# Patient Record
Sex: Female | Born: 1965
Health system: Southern US, Community
[De-identification: ages and names within clinical notes are randomized; demographics above are authoritative.]

## PROBLEM LIST (undated history)

## (undated) DIAGNOSIS — F419 Anxiety disorder, unspecified: Secondary | ICD-10-CM

## (undated) DIAGNOSIS — D509 Iron deficiency anemia, unspecified: Secondary | ICD-10-CM

## (undated) DIAGNOSIS — F32A Depression, unspecified: Secondary | ICD-10-CM

## (undated) DIAGNOSIS — K922 Gastrointestinal hemorrhage, unspecified: Secondary | ICD-10-CM

## (undated) DIAGNOSIS — Z4541 Encounter for adjustment and management of cerebrospinal fluid drainage device: Secondary | ICD-10-CM

## (undated) DIAGNOSIS — G4733 Obstructive sleep apnea (adult) (pediatric): Secondary | ICD-10-CM

## (undated) DIAGNOSIS — D649 Anemia, unspecified: Secondary | ICD-10-CM

## (undated) DIAGNOSIS — R202 Paresthesia of skin: Secondary | ICD-10-CM

## (undated) DIAGNOSIS — G4730 Sleep apnea, unspecified: Secondary | ICD-10-CM

## (undated) DIAGNOSIS — F329 Major depressive disorder, single episode, unspecified: Secondary | ICD-10-CM

## (undated) DIAGNOSIS — N183 Chronic kidney disease, stage 3 unspecified: Secondary | ICD-10-CM

## (undated) DIAGNOSIS — I639 Cerebral infarction, unspecified: Secondary | ICD-10-CM

## (undated) DIAGNOSIS — K2210 Ulcer of esophagus without bleeding: Secondary | ICD-10-CM

## (undated) DIAGNOSIS — K219 Gastro-esophageal reflux disease without esophagitis: Secondary | ICD-10-CM

## (undated) DIAGNOSIS — G919 Hydrocephalus, unspecified: Secondary | ICD-10-CM

## (undated) DIAGNOSIS — E739 Lactose intolerance, unspecified: Secondary | ICD-10-CM

## (undated) DIAGNOSIS — K253 Acute gastric ulcer without hemorrhage or perforation: Secondary | ICD-10-CM

## (undated) DIAGNOSIS — K222 Esophageal obstruction: Secondary | ICD-10-CM

## (undated) DIAGNOSIS — E785 Hyperlipidemia, unspecified: Secondary | ICD-10-CM

## (undated) DIAGNOSIS — G43009 Migraine without aura, not intractable, without status migrainosus: Secondary | ICD-10-CM

## (undated) DIAGNOSIS — K449 Diaphragmatic hernia without obstruction or gangrene: Secondary | ICD-10-CM

## (undated) DIAGNOSIS — R413 Other amnesia: Secondary | ICD-10-CM

## (undated) DIAGNOSIS — R9431 Abnormal electrocardiogram [ECG] [EKG]: Secondary | ICD-10-CM

## (undated) HISTORY — DX: Abnormal electrocardiogram (ECG) (EKG): R94.31

## (undated) HISTORY — DX: Obstructive sleep apnea (adult) (pediatric): G47.33

## (undated) HISTORY — DX: Lactose intolerance, unspecified: E73.9

## (undated) HISTORY — DX: Ulcer of esophagus without bleeding: K22.10

## (undated) HISTORY — PX: OTHER SURGICAL HISTORY: SHX169

## (undated) HISTORY — DX: Paresthesia of skin: R20.2

## (undated) HISTORY — DX: Diaphragmatic hernia without obstruction or gangrene: K44.9

## (undated) HISTORY — DX: Major depressive disorder, single episode, unspecified: F32.9

## (undated) HISTORY — DX: Migraine without aura, not intractable, without status migrainosus: G43.009

## (undated) HISTORY — DX: Hyperlipidemia, unspecified: E78.5

## (undated) HISTORY — PX: CYST REMOVAL NECK: SHX6281

## (undated) HISTORY — DX: Cerebral infarction, unspecified: I63.9

## (undated) HISTORY — DX: Gastro-esophageal reflux disease without esophagitis: K21.9

## (undated) HISTORY — DX: Acute gastric ulcer without hemorrhage or perforation: K25.3

## (undated) HISTORY — DX: Encounter for adjustment and management of cerebrospinal fluid drainage device: Z45.41

## (undated) HISTORY — DX: Anemia, unspecified: D64.9

## (undated) HISTORY — DX: Esophageal obstruction: K22.2

## (undated) HISTORY — DX: Sleep apnea, unspecified: G47.30

## (undated) HISTORY — DX: Hydrocephalus, unspecified: G91.9

## (undated) HISTORY — DX: Depression, unspecified: F32.A

## (undated) HISTORY — DX: Gastrointestinal hemorrhage, unspecified: K92.2

## (undated) HISTORY — DX: Iron deficiency anemia, unspecified: D50.9

## (undated) HISTORY — DX: Chronic kidney disease, stage 3 unspecified: N18.30

## (undated) HISTORY — DX: Other amnesia: R41.3

## (undated) HISTORY — DX: Anxiety disorder, unspecified: F41.9

---
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
## 1998-07-31 ENCOUNTER — Ambulatory Visit (HOSPITAL_COMMUNITY): Admission: RE | Admit: 1998-07-31 | Discharge: 1998-07-31 | Payer: Self-pay | Admitting: Obstetrics and Gynecology

## 1998-09-11 ENCOUNTER — Ambulatory Visit (HOSPITAL_COMMUNITY): Admission: RE | Admit: 1998-09-11 | Discharge: 1998-09-11 | Payer: Self-pay | Admitting: Obstetrics & Gynecology

## 1998-10-07 ENCOUNTER — Ambulatory Visit (HOSPITAL_COMMUNITY): Admission: RE | Admit: 1998-10-07 | Discharge: 1998-10-07 | Payer: Self-pay | Admitting: Obstetrics & Gynecology

## 2001-05-08 ENCOUNTER — Other Ambulatory Visit: Admission: RE | Admit: 2001-05-08 | Discharge: 2001-05-08 | Payer: Self-pay | Admitting: Gynecology

## 2002-09-22 ENCOUNTER — Emergency Department (HOSPITAL_COMMUNITY): Admission: EM | Admit: 2002-09-22 | Discharge: 2002-09-23 | Payer: Self-pay | Admitting: Emergency Medicine

## 2002-09-22 ENCOUNTER — Encounter: Payer: Self-pay | Admitting: Emergency Medicine

## 2003-02-20 ENCOUNTER — Encounter: Payer: Self-pay | Admitting: Internal Medicine

## 2003-02-20 ENCOUNTER — Ambulatory Visit (HOSPITAL_COMMUNITY): Admission: RE | Admit: 2003-02-20 | Discharge: 2003-02-20 | Payer: Self-pay | Admitting: Internal Medicine

## 2004-08-25 ENCOUNTER — Ambulatory Visit (HOSPITAL_COMMUNITY): Admission: RE | Admit: 2004-08-25 | Discharge: 2004-08-25 | Payer: Self-pay | Admitting: Neurology

## 2004-11-26 ENCOUNTER — Ambulatory Visit: Payer: Self-pay | Admitting: Internal Medicine

## 2004-11-30 ENCOUNTER — Ambulatory Visit: Payer: Self-pay | Admitting: Internal Medicine

## 2004-12-24 ENCOUNTER — Ambulatory Visit (HOSPITAL_BASED_OUTPATIENT_CLINIC_OR_DEPARTMENT_OTHER): Admission: RE | Admit: 2004-12-24 | Discharge: 2004-12-24 | Payer: Self-pay | Admitting: Internal Medicine

## 2004-12-24 ENCOUNTER — Ambulatory Visit: Payer: Self-pay | Admitting: Pulmonary Disease

## 2005-02-07 ENCOUNTER — Ambulatory Visit: Payer: Self-pay | Admitting: Internal Medicine

## 2005-05-19 ENCOUNTER — Ambulatory Visit: Payer: Self-pay | Admitting: Pulmonary Disease

## 2005-06-21 ENCOUNTER — Ambulatory Visit: Payer: Self-pay | Admitting: Pulmonary Disease

## 2005-08-23 ENCOUNTER — Other Ambulatory Visit: Admission: RE | Admit: 2005-08-23 | Discharge: 2005-08-23 | Payer: Self-pay | Admitting: Gynecology

## 2005-11-22 ENCOUNTER — Ambulatory Visit: Payer: Self-pay | Admitting: Internal Medicine

## 2006-08-24 ENCOUNTER — Ambulatory Visit: Payer: Self-pay | Admitting: Internal Medicine

## 2006-10-23 ENCOUNTER — Ambulatory Visit: Payer: Self-pay | Admitting: Internal Medicine

## 2006-11-22 ENCOUNTER — Ambulatory Visit: Payer: Self-pay | Admitting: Internal Medicine

## 2006-11-22 LAB — CONVERTED CEMR LAB
ALT: 25 units/L (ref 0–40)
AST: 25 units/L (ref 0–37)
Albumin: 3.4 g/dL — ABNORMAL LOW (ref 3.5–5.2)
Alkaline Phosphatase: 45 units/L (ref 39–117)
BUN: 8 mg/dL (ref 6–23)
Basophils Absolute: 0 10*3/uL (ref 0.0–0.1)
Basophils Relative: 0.8 % (ref 0.0–1.0)
Bilirubin Urine: NEGATIVE
CO2: 29 meq/L (ref 19–32)
Calcium: 9.6 mg/dL (ref 8.4–10.5)
Chloride: 106 meq/L (ref 96–112)
Chol/HDL Ratio, serum: 6.2
Cholesterol: 291 mg/dL (ref 0–200)
Creatinine, Ser: 0.8 mg/dL (ref 0.4–1.2)
Eosinophil percent: 2.6 % (ref 0.0–5.0)
GFR calc non Af Amer: 84 mL/min
Glomerular Filtration Rate, Af Am: 101 mL/min/{1.73_m2}
Glucose, Bld: 108 mg/dL — ABNORMAL HIGH (ref 70–99)
HCT: 40.8 % (ref 36.0–46.0)
HDL: 47.3 mg/dL (ref >39.0)
Hemoglobin, Urine: NEGATIVE
Hemoglobin: 13.5 g/dL (ref 12.0–15.0)
Ketones, ur: NEGATIVE mg/dL
LDL DIRECT: 203.7 mg/dL
Leukocytes, UA: NEGATIVE
Lymphocytes Relative: 33.3 % (ref 12.0–46.0)
MCHC: 33.2 g/dL (ref 30.0–36.0)
MCV: 91.8 fL (ref 78.0–100.0)
Monocytes Absolute: 0.5 10*3/uL (ref 0.2–0.7)
Monocytes Relative: 8 % (ref 3.0–11.0)
Neutro Abs: 3.4 10*3/uL (ref 1.4–7.7)
Neutrophils Relative %: 55.3 % (ref 43.0–77.0)
Nitrite: NEGATIVE
Platelets: 483 10*3/uL — ABNORMAL HIGH (ref 150–400)
Potassium: 4.4 meq/L (ref 3.5–5.1)
RBC: 4.44 M/uL (ref 3.87–5.11)
RDW: 12.4 % (ref 11.5–14.6)
Sodium: 140 meq/L (ref 135–145)
Specific Gravity, Urine: >=1.03 (ref 1.000–1.03)
TSH: 2.11 microintl units/mL (ref 0.35–5.50)
Total Bilirubin: 0.6 mg/dL (ref 0.3–1.2)
Total Protein, Urine: NEGATIVE mg/dL
Total Protein: 7.1 g/dL (ref 6.0–8.3)
Triglyceride fasting, serum: 136 mg/dL (ref 0–149)
Urine Glucose: NEGATIVE mg/dL
Urobilinogen, UA: 0.2 (ref 0.0–1.0)
VLDL: 27 mg/dL (ref 0–40)
WBC: 6.2 10*3/uL (ref 4.5–10.5)
pH: 6 (ref 5.0–8.0)

## 2006-11-23 ENCOUNTER — Encounter (INDEPENDENT_AMBULATORY_CARE_PROVIDER_SITE_OTHER): Payer: Self-pay | Admitting: *Deleted

## 2006-11-23 LAB — CONVERTED CEMR LAB: Glucose, Bld: 109 mg/dL — ABNORMAL HIGH (ref 70–99)

## 2006-11-27 ENCOUNTER — Ambulatory Visit: Payer: Self-pay | Admitting: Internal Medicine

## 2007-10-01 ENCOUNTER — Ambulatory Visit: Payer: Self-pay | Admitting: Internal Medicine

## 2007-10-01 DIAGNOSIS — G4733 Obstructive sleep apnea (adult) (pediatric): Secondary | ICD-10-CM

## 2007-10-01 DIAGNOSIS — G43009 Migraine without aura, not intractable, without status migrainosus: Secondary | ICD-10-CM | POA: Insufficient documentation

## 2007-10-01 DIAGNOSIS — E785 Hyperlipidemia, unspecified: Secondary | ICD-10-CM | POA: Insufficient documentation

## 2007-10-01 DIAGNOSIS — E739 Lactose intolerance, unspecified: Secondary | ICD-10-CM

## 2007-10-01 DIAGNOSIS — Z9989 Dependence on other enabling machines and devices: Secondary | ICD-10-CM

## 2007-10-01 DIAGNOSIS — L02419 Cutaneous abscess of limb, unspecified: Secondary | ICD-10-CM | POA: Insufficient documentation

## 2007-10-01 DIAGNOSIS — L03119 Cellulitis of unspecified part of limb: Secondary | ICD-10-CM

## 2007-10-01 DIAGNOSIS — Z982 Presence of cerebrospinal fluid drainage device: Secondary | ICD-10-CM | POA: Insufficient documentation

## 2007-10-01 HISTORY — DX: Hyperlipidemia, unspecified: E78.5

## 2007-10-01 HISTORY — DX: Migraine without aura, not intractable, without status migrainosus: G43.009

## 2007-10-01 HISTORY — DX: Lactose intolerance, unspecified: E73.9

## 2007-10-01 HISTORY — DX: Obstructive sleep apnea (adult) (pediatric): G47.33

## 2007-11-17 ENCOUNTER — Emergency Department (HOSPITAL_COMMUNITY): Admission: EM | Admit: 2007-11-17 | Discharge: 2007-11-17 | Payer: Self-pay | Admitting: Family Medicine

## 2007-12-12 ENCOUNTER — Ambulatory Visit: Payer: Self-pay | Admitting: Internal Medicine

## 2008-11-19 ENCOUNTER — Ambulatory Visit: Payer: Self-pay | Admitting: Internal Medicine

## 2008-11-19 DIAGNOSIS — G8929 Other chronic pain: Secondary | ICD-10-CM | POA: Insufficient documentation

## 2008-11-19 DIAGNOSIS — J019 Acute sinusitis, unspecified: Secondary | ICD-10-CM | POA: Insufficient documentation

## 2008-11-19 DIAGNOSIS — F419 Anxiety disorder, unspecified: Secondary | ICD-10-CM | POA: Insufficient documentation

## 2009-03-11 ENCOUNTER — Telehealth: Payer: Self-pay | Admitting: Internal Medicine

## 2009-03-18 ENCOUNTER — Encounter: Payer: Self-pay | Admitting: Internal Medicine

## 2009-09-08 ENCOUNTER — Ambulatory Visit: Payer: Self-pay | Admitting: Endocrinology

## 2009-10-13 ENCOUNTER — Ambulatory Visit: Payer: Self-pay | Admitting: Internal Medicine

## 2009-10-13 DIAGNOSIS — R05 Cough: Secondary | ICD-10-CM

## 2009-10-13 DIAGNOSIS — R059 Cough, unspecified: Secondary | ICD-10-CM | POA: Insufficient documentation

## 2009-11-30 ENCOUNTER — Ambulatory Visit: Payer: Self-pay | Admitting: Internal Medicine

## 2009-11-30 DIAGNOSIS — R079 Chest pain, unspecified: Secondary | ICD-10-CM | POA: Insufficient documentation

## 2009-11-30 LAB — CONVERTED CEMR LAB
ALT: 23 units/L (ref 0–35)
AST: 25 units/L (ref 0–37)
Albumin: 4 g/dL (ref 3.5–5.2)
Alkaline Phosphatase: 40 units/L (ref 39–117)
BUN: 13 mg/dL (ref 6–23)
Basophils Absolute: 0 10*3/uL (ref 0.0–0.1)
Basophils Relative: 0 % (ref 0.0–3.0)
Bilirubin, Direct: 0 mg/dL (ref 0.0–0.3)
CO2: 27 meq/L (ref 19–32)
Calcium: 9.4 mg/dL (ref 8.4–10.5)
Chloride: 104 meq/L (ref 96–112)
Creatinine, Ser: 0.8 mg/dL (ref 0.4–1.2)
Eosinophils Absolute: 0.1 10*3/uL (ref 0.0–0.7)
Eosinophils Relative: 2.1 % (ref 0.0–5.0)
GFR calc non Af Amer: 82.21 mL/min (ref >60)
Glucose, Bld: 92 mg/dL (ref 70–99)
HCT: 38.3 % (ref 36.0–46.0)
Hemoglobin: 12.8 g/dL (ref 12.0–15.0)
Lymphocytes Relative: 39.9 % (ref 12.0–46.0)
Lymphs Abs: 2.8 10*3/uL (ref 0.7–4.0)
MCHC: 33.6 g/dL (ref 30.0–36.0)
MCV: 92.7 fL (ref 78.0–100.0)
Monocytes Absolute: 0.4 10*3/uL (ref 0.1–1.0)
Monocytes Relative: 5.9 % (ref 3.0–12.0)
Neutro Abs: 3.6 10*3/uL (ref 1.4–7.7)
Neutrophils Relative %: 52.1 % (ref 43.0–77.0)
Platelets: 429 10*3/uL — ABNORMAL HIGH (ref 150.0–400.0)
Potassium: 4.3 meq/L (ref 3.5–5.1)
RBC: 4.13 M/uL (ref 3.87–5.11)
RDW: 12.4 % (ref 11.5–14.6)
Sed Rate: 42 mm/hr — ABNORMAL HIGH (ref 0–22)
Sodium: 139 meq/L (ref 135–145)
TSH: 2.95 microintl units/mL (ref 0.35–5.50)
Total Bilirubin: 0.6 mg/dL (ref 0.3–1.2)
Total CK: 65 units/L (ref 7–177)
Total Protein: 7.2 g/dL (ref 6.0–8.3)
Vitamin B-12: 388 pg/mL (ref 211–911)
WBC: 6.9 10*3/uL (ref 4.5–10.5)

## 2009-12-01 ENCOUNTER — Encounter: Payer: Self-pay | Admitting: Internal Medicine

## 2009-12-01 ENCOUNTER — Telehealth: Payer: Self-pay | Admitting: Internal Medicine

## 2009-12-04 ENCOUNTER — Ambulatory Visit (HOSPITAL_COMMUNITY): Admission: RE | Admit: 2009-12-04 | Discharge: 2009-12-04 | Payer: Self-pay | Admitting: Internal Medicine

## 2010-06-29 ENCOUNTER — Ambulatory Visit: Payer: Self-pay | Admitting: Internal Medicine

## 2010-06-29 DIAGNOSIS — R5383 Other fatigue: Secondary | ICD-10-CM | POA: Insufficient documentation

## 2010-06-29 LAB — CONVERTED CEMR LAB: Vit D, 25-Hydroxy: 39 ng/mL (ref 30–89)

## 2010-07-01 ENCOUNTER — Telehealth: Payer: Self-pay | Admitting: Internal Medicine

## 2010-07-01 LAB — CONVERTED CEMR LAB
ALT: 22 units/L (ref 0–35)
AST: 23 units/L (ref 0–37)
Albumin: 3.9 g/dL (ref 3.5–5.2)
Alkaline Phosphatase: 41 units/L (ref 39–117)
BUN: 12 mg/dL (ref 6–23)
Basophils Absolute: 0 10*3/uL (ref 0.0–0.1)
Basophils Relative: 0.6 % (ref 0.0–3.0)
Bilirubin Urine: NEGATIVE
Bilirubin, Direct: 0.1 mg/dL (ref 0.0–0.3)
CO2: 32 meq/L (ref 19–32)
Calcium: 9.6 mg/dL (ref 8.4–10.5)
Chloride: 105 meq/L (ref 96–112)
Cholesterol: 306 mg/dL — ABNORMAL HIGH (ref 0–200)
Creatinine, Ser: 0.9 mg/dL (ref 0.4–1.2)
Direct LDL: 236.3 mg/dL
Eosinophils Absolute: 0.1 10*3/uL (ref 0.0–0.7)
Eosinophils Relative: 1.7 % (ref 0.0–5.0)
GFR calc non Af Amer: 76.46 mL/min (ref >60)
Glucose, Bld: 85 mg/dL (ref 70–99)
HCT: 37.3 % (ref 36.0–46.0)
HDL: 48.8 mg/dL (ref >39.00)
Hemoglobin, Urine: NEGATIVE
Hemoglobin: 12.9 g/dL (ref 12.0–15.0)
Ketones, ur: NEGATIVE mg/dL
Leukocytes, UA: NEGATIVE
Lymphocytes Relative: 36.5 % (ref 12.0–46.0)
Lymphs Abs: 2.3 10*3/uL (ref 0.7–4.0)
MCHC: 34.6 g/dL (ref 30.0–36.0)
MCV: 91.1 fL (ref 78.0–100.0)
Monocytes Absolute: 0.5 10*3/uL (ref 0.1–1.0)
Monocytes Relative: 8.3 % (ref 3.0–12.0)
Neutro Abs: 3.3 10*3/uL (ref 1.4–7.7)
Neutrophils Relative %: 52.9 % (ref 43.0–77.0)
Nitrite: NEGATIVE
Platelets: 435 10*3/uL — ABNORMAL HIGH (ref 150.0–400.0)
Potassium: 4.9 meq/L (ref 3.5–5.1)
RBC: 4.09 M/uL (ref 3.87–5.11)
RDW: 13.9 % (ref 11.5–14.6)
Sodium: 142 meq/L (ref 135–145)
Specific Gravity, Urine: >=1.03 (ref 1.000–1.030)
TSH: 2.81 microintl units/mL (ref 0.35–5.50)
Total Bilirubin: 0.5 mg/dL (ref 0.3–1.2)
Total CHOL/HDL Ratio: 6
Total Protein, Urine: NEGATIVE mg/dL
Total Protein: 7.1 g/dL (ref 6.0–8.3)
Triglycerides: 139 mg/dL (ref 0.0–149.0)
Urine Glucose: NEGATIVE mg/dL
Urobilinogen, UA: 0.2 (ref 0.0–1.0)
VLDL: 27.8 mg/dL (ref 0.0–40.0)
Vitamin B-12: 363 pg/mL (ref 211–911)
WBC: 6.2 10*3/uL (ref 4.5–10.5)
pH: 5.5 (ref 5.0–8.0)

## 2010-07-09 ENCOUNTER — Telehealth: Payer: Self-pay | Admitting: Internal Medicine

## 2010-08-09 ENCOUNTER — Telehealth: Payer: Self-pay | Admitting: Internal Medicine

## 2010-09-03 ENCOUNTER — Ambulatory Visit: Payer: Self-pay | Admitting: Internal Medicine

## 2010-09-03 DIAGNOSIS — E162 Hypoglycemia, unspecified: Secondary | ICD-10-CM | POA: Insufficient documentation

## 2010-09-15 ENCOUNTER — Ambulatory Visit: Payer: Self-pay | Admitting: Internal Medicine

## 2010-09-15 LAB — CONVERTED CEMR LAB
C-Peptide: 1.74 ng/mL (ref 0.80–3.90)
Cortisol, Plasma: 9.1 ug/dL
Glucose, Bld: 85 mg/dL (ref 70–99)
Insulin: 7 microintl units/mL (ref 3–28)

## 2010-09-16 ENCOUNTER — Ambulatory Visit: Payer: Self-pay | Admitting: Internal Medicine

## 2010-09-16 DIAGNOSIS — R110 Nausea: Secondary | ICD-10-CM | POA: Insufficient documentation

## 2010-09-16 DIAGNOSIS — F4323 Adjustment disorder with mixed anxiety and depressed mood: Secondary | ICD-10-CM | POA: Insufficient documentation

## 2010-10-15 ENCOUNTER — Telehealth: Payer: Self-pay | Admitting: Internal Medicine

## 2010-10-28 ENCOUNTER — Ambulatory Visit: Payer: Self-pay | Admitting: Internal Medicine

## 2010-12-14 NOTE — Assessment & Plan Note (Signed)
Summary: COLD / NWS   Vital Signs:  Patient profile:   45 year old female Height:      58 inches (147.32 cm) Weight:      137.13 pounds (62.33 kg) BMI:     28.76 O2 Sat:      97 % on Room air Temp:     97.5 degrees F (36.39 degrees C) oral Pulse rate:   86 / minute BP sitting:   124 / 80  (left arm) Cuff size:   regular  Vitals Entered By: Josph Macho CMA (September 08, 2009 3:13 PM)  O2 Flow:  Room air CC: Cold X1Week/ CF   CC:  Cold X1Week/ CF.  History of Present Illness: 1 week of sore throat, nasal congestion, prod cough.  Current Medications (verified): 1)  Imitrex 100 Mg  Tabs (Sumatriptan Succinate) .Marland Kitchen.. 1 By Mouth Once Daily Prn 2)  Citalopram Hydrobromide 10 Mg  Tabs (Citalopram Hydrobromide) .... Take 1 Tab By Mouth Daily 3)  Lorazepam 0.5 Mg Tabs (Lorazepam) .Marland Kitchen.. 1 By Mouth Two Times A Day As Needed Anxiety 4)  Vitamin D3 1000 Unit  Tabs (Cholecalciferol) .Marland Kitchen.. 1 By Mouth Daily 5)  Multivitamins   Tabs (Multiple Vitamin) .... Once Daily 6)  Fish Oil Caps .Marland Kitchen.. 6 A Day 7)  Ginko Biloba .Marland Kitchen.. 4 A Day  Allergies (verified): No Known Drug Allergies  Past History:  Past Medical History: Last updated: 11/19/2008 migraine HA OSA Hyperlipidemia glucose intolerance cerebral shunts Anxiety  Review of Systems  The patient denies fever and dyspnea on exertion.    Physical Exam  General:  obese.  no distress  Head:  head: no deformity eyes: no periorbital swelling, no proptosis external nose and ears are normal mouth: no lesion seen Ears:  left tm is slightly red Neck:  Supple without thyroid enlargement or tenderness. No cervical lymphadenopathy, neck masses or tracheal deviation.  Lungs:  Clear to auscultation bilaterally. Normal respiratory effort.    Impression & Recommendations:  Problem # 1:  SINUSITIS, ACUTE (ICD-461.9)  Medications Added to Medication List This Visit: 1)  Fish Oil Caps  .Marland Kitchen.. 6 a day 2)  Ginko Biloba  .Marland Kitchen.. 4 a day 3)   Cefuroxime Axetil 250 Mg Tabs (Cefuroxime axetil) .Marland Kitchen.. 1 bid  Other Orders: Est. Patient Level III (04540)  Patient Instructions: 1)  cefuroxime 250 mg two times a day 2)  loratadine-d as needed congestion 3)  return here as needed Prescriptions: CEFUROXIME AXETIL 250 MG TABS (CEFUROXIME AXETIL) 1 bid  #14 x 0   Entered and Authorized by:   Minus Breeding MD   Signed by:   Minus Breeding MD on 09/08/2009   Method used:   Electronically to        [facility] [address] (retail)       [address]       [place], [place]  [address]       Ph: [phone]       Fax: [phone]   RxID:   [id number]

## 2010-12-14 NOTE — Assessment & Plan Note (Signed)
Summary: cold,cough/no fever/requests dr plot/$50/cd   Vital Signs:  Patient Profile:   45 Years Old Female Weight:      132 pounds Temp:     98.2 degrees F oral Pulse rate:   72 / minute BP sitting:   152 / 100  (left arm)  Vitals Entered By: Tora Perches (November 19, 2008 4:28 PM)                 Chief Complaint:  Multiple medical problems or concerns.  History of Present Illness: The patient presents with complaints of sore throat, fever,  sinus congestion and drainge of several 2 wks  duration. Not better with OTC meds.    The mucus is colored. C/o anxiety, stressed out    Current Allergies: No known allergies   Past Medical History:    migraine HA    OSA    Hyperlipidemia    glucose intolerance    cerebral shunts    Anxiety   Family History:    aunt with melanoma    uncle with lung cancer    grandmother with lung cancer    grandfather with lung cancer    fathe died with MI at 47 yo    M dementia  Social History:    Never Smoked    Alcohol use-yes    Occupation: [employer]    Married    M is in a NH    Review of Systems       BP nl at home   Physical Exam  General:     NAD Nose:     Erythematous throat mucosa and intranasal erythema.  Lungs:     CTA Heart:     RRR Abdomen:     Bowel sounds positive,abdomen soft and non-tender without masses, organomegaly or hernias noted. Msk:     No deformity or scoliosis noted of thoracic or lumbar spine.   Skin:     clear Psych:     Oriented X3, normally interactive, and good eye contact.      Impression & Recommendations:  Problem # 1:  SINUSITIS, ACUTE (ICD-461.9)  The following medications were removed from the medication list:    Doxycycline Hyclate 100 Mg Tabs (Doxycycline hyclate) .Marland Kitchen... 1 by mouth two times a day  Her updated medication list for this problem includes:    Zithromax Z-pak 250 Mg Tabs (Azithromycin) ..... Use as directed   Problem # 2:  ANXIETY  (ICD-300.00) Assessment: Deteriorated  Her updated medication list for this problem includes:    Citalopram Hydrobromide 10 Mg Tabs (Citalopram hydrobromide) .Marland Kitchen... Take 1 tab by mouth daily    Lorazepam 0.5 Mg Tabs (Lorazepam) .Marland Kitchen... 1 by mouth two times a day as needed anxiety >20 min  Problem # 3:  HYPERLIPIDEMIA (ICD-272.4) Get labs  Complete Medication List: 1)  Imitrex 100 Mg Tabs (Sumatriptan succinate) .Marland Kitchen.. 1 by mouth once daily prn 2)  Citalopram Hydrobromide 10 Mg Tabs (Citalopram hydrobromide) .... Take 1 tab by mouth daily 3)  Lorazepam 0.5 Mg Tabs (Lorazepam) .Marland Kitchen.. 1 by mouth two times a day as needed anxiety 4)  Zithromax Z-pak 250 Mg Tabs (Azithromycin) .... Use as directed 5)  Vitamin D3 1000 Unit Tabs (Cholecalciferol) .Marland Kitchen.. 1 by mouth daily 6)  Multivitamins Tabs (Multiple vitamin) .... Once daily   Patient Instructions: 1)  Please schedule a follow-up appointment in 3 months. 2)  BMP prior to visit, ICD-9: v70.0  300.00 3)  Hepatic Panel prior to visit,  ICD-9: 4)  Lipid Panel prior to visit, ICD-9: 5)  TSH prior to visit, ICD-9: 6)  CBC w/ Diff prior to visit, ICD-9: 7)  Urine-dip prior to visit, ICD-9:   Prescriptions: ZITHROMAX Z-PAK 250 MG  TABS (AZITHROMYCIN) Use as directed  #1 x 1   Entered and Authorized by:   Tresa Garter MD   Signed by:   Tresa Garter MD on 11/19/2008   Method used:   Print then Give to Patient   RxID:   [id number] LORAZEPAM 0.5 MG TABS (LORAZEPAM) 1 by mouth two times a day as needed anxiety  #60 x 1   Entered and Authorized by:   Tresa Garter MD   Signed by:   Tresa Garter MD on 11/19/2008   Method used:   Print then Give to Patient   RxID:   [id number] CITALOPRAM HYDROBROMIDE 10 MG  TABS (CITALOPRAM HYDROBROMIDE) Take 1 tab by mouth daily  #30 x 6   Entered and Authorized by:   Tresa Garter MD   Signed by:   Tresa Garter MD on 11/19/2008   Method used:   Print then Give to  Patient   RxID:   [id number]  ]

## 2010-12-14 NOTE — Assessment & Plan Note (Signed)
Summary: POSSIBLE SPIDER BITE/NML   Vital Signs:  Patient Profile:   45 Years Old Female Weight:      134 pounds Temp:     97 degrees F oral Pulse rate:   97 / minute BP sitting:   123 / 79  (right arm) Cuff size:   regular  Pt. in pain?   no  Vitals Entered By: Maris Berger (October 01, 2007 3:58 PM)                  Chief Complaint:  SPIDER BITE.  History of Present Illness: here with spot of red, tender and pus to prox ant leg just below the knee, now with pain this am increasing up the mid ant right thigh area  Current Allergies (reviewed today): No known allergies  Updated/Current Medications (including changes made in today's visit):  IMITREX 100 MG  TABS (SUMATRIPTAN SUCCINATE) 1 by mouth once daily prn DOXYCYCLINE HYCLATE 100 MG  TABS (DOXYCYCLINE HYCLATE) 1 by mouth two times a day   Past Medical History:    Reviewed history and no changes required:       migraine       OSA       Hyperlipidemia       glucose intolerance       cerebral shunts  Past Surgical History:    Reviewed history and no changes required:       Cholecystectomy       s/p pituitary cyst with subsequent shunt   Family History:    Reviewed history and no changes required:       aunt with melanoma       uncle with lung cancer       grandmother with lung cancer       grandfather with lung cancer       fathe died with MI at 47 yo  Social History:    Reviewed history and no changes required:       Never Smoked       Alcohol use-yes   Risk Factors:  Tobacco use:  never Alcohol use:  yes    Physical Exam  General:     Well-developed,well-nourished,in no acute distress; alert,appropriate and cooperative throughout examination Head:     Normocephalic and atraumatic without obvious abnormalities. No apparent alopecia or balding. Eyes:     No corneal or conjunctival inflammation noted. EOMI. Perrla.  Neck:     No deformities, masses, or tenderness  noted. Lungs:     Normal respiratory effort, chest expands symmetrically. Lungs are clear to auscultation, no crackles or wheezes. Heart:     Normal rate and regular rhythm. S1 and S2 normal without gallop, murmur, click, rub or other extra sounds. Skin:     right leg with 2 cm area just below the knee 1 cm with pustular lesion and surrounding erythema, also some lymphangitic streaking to right thigh    Impression & Recommendations:  Problem # 1:  ABSCESS, LEG (ICD-682.6)  Her updated medication list for this problem includes:    Doxycycline Hyclate 100 Mg Tabs (Doxycycline hyclate) .Marland Kitchen... 1 by mouth two times a day  quite small, but already seeming to spread, tx as above   Complete Medication List: 1)  Imitrex 100 Mg Tabs (Sumatriptan succinate) .Marland Kitchen.. 1 by mouth once daily prn 2)  Doxycycline Hyclate 100 Mg Tabs (Doxycycline hyclate) .Marland Kitchen.. 1 by mouth two times a day     Prescriptions: DOXYCYCLINE HYCLATE 100 MG  TABS (DOXYCYCLINE HYCLATE) 1 by mouth two times a day  #20 x 0   Entered and Authorized by:   Corwin Levins MD   Signed by:   Corwin Levins MD on 10/01/2007   Method used:   Print then Give to Patient   RxID:   [id number]  ]

## 2010-12-14 NOTE — Assessment & Plan Note (Signed)
Summary: pulled muscle lower back/cd   Vital Signs:  Patient profile:   46 year old female Weight:      137 pounds O2 Sat:      95 % on Room air Temp:     98 degrees F oral Pulse rate:   110 / minute BP sitting:   136 / 90  (left arm)  Vitals Entered By: Tora Perches (November 30, 2009 4:03 PM)  O2 Flow:  Room air CC: pulled muscle.    back pain Is Patient Diabetic? No   CC:  pulled muscle.    back pain.  History of Present Illness: C/o sudden onset CP 4 pm last night when she was  in the kitchen; pain was sharp  on L and irrad.  under L breast. C/o LBP and L leg numbness. L arm was numb too. No injury prior. L foot and leg is still numb. No facial numbness. No HA or n/v. No fever.  Preventive Screening-Counseling & Management  Alcohol-Tobacco     Smoking Status: never  Current Medications (verified): 1)  Imitrex 100 Mg  Tabs (Sumatriptan Succinate) .Marland Kitchen.. 1 By Mouth Once Daily Prn 2)  Citalopram Hydrobromide 10 Mg  Tabs (Citalopram Hydrobromide) .... Take 1 Tab By Mouth Daily 3)  Lorazepam 0.5 Mg Tabs (Lorazepam) .Marland Kitchen.. 1 By Mouth Two Times A Day As Needed Anxiety 4)  Vitamin D3 1000 Unit  Tabs (Cholecalciferol) .Marland Kitchen.. 1 By Mouth Daily 5)  Multivitamins   Tabs (Multiple Vitamin) .... Once Daily 6)  Fish Oil Caps .Marland Kitchen.. 6 A Day 7)  Ginko Biloba .Marland Kitchen.. 4 A Day  Allergies (verified): No Known Drug Allergies  Past History:  Past Medical History: Last updated: 11/19/2008 migraine HA OSA Hyperlipidemia glucose intolerance cerebral shunts Anxiety  Past Surgical History: Last updated: 10/01/2007 Cholecystectomy s/p pituitary cyst with subsequent shunt  Family History: Last updated: 11/19/2008 aunt with melanoma uncle with lung cancer grandmother with lung cancer grandfather with lung cancer fathe died with MI at 47 yo M dementia  Social History: Last updated: 11/19/2008 Never Smoked Alcohol use-yes Occupation: [employer] Married M is in a NH  Review of Systems       The patient complains of chest pain and abdominal pain.  The patient denies anorexia, fever, vision loss, dyspnea on exertion, prolonged cough, muscle weakness, difficulty walking, depression, abnormal bleeding, and enlarged lymph nodes.    Physical Exam  General:  NAD, obese Head:  Normocephalic and atraumatic without obvious abnormalities. No apparent alopecia or balding. Eyes:  No corneal or conjunctival inflammation noted. EOMI. Perrla. Ears:  External ear exam shows no significant lesions or deformities.  Otoscopic examination reveals clear canals, tympanic membranes are intact bilaterally without bulging, retraction, inflammation or discharge. Hearing is grossly normal bilaterally. Nose:  External nasal examination shows no deformity or inflammation. Nasal mucosa are pink and moist without lesions or exudates. Mouth:  Oral mucosa and oropharynx without lesions or exudates.  Teeth in good repair. Neck:  No deformities, masses, or tenderness noted. Chest Wall:  NT Lungs:  Normal respiratory effort, chest expands symmetrically. Lungs are clear to auscultation, no crackles or wheezes. Heart:  Normal rate and regular rhythm. S1 and S2 normal without gallop, murmur, click, rub or other extra sounds. Abdomen:  Bowel sounds positive,abdomen soft and non-tender without masses, organomegaly or hernias noted. Msk:  normal ROM, no joint tenderness, no joint swelling, no joint warmth, no redness over joints, no joint instability, and no muscle atrophy.   Pulses:  R and L carotid,radial,femoral,dorsalis pedis and posterior tibial pulses are full and equal bilaterally Extremities:  No clubbing, cyanosis, edema, or deformity noted with normal full range of motion of all joints.  No edema, calves NT B Neurologic:  Decreased sensation on L foot and L hand MS ok x 4 Romberg neg Skin:  No rash Cervical Nodes:  No lymphadenopathy noted Inguinal Nodes:  No significant adenopathy Psych:  Oriented X3 and  good eye contact.     Impression & Recommendations:  Problem # 1:  CHEST PAIN (ICD-786.50) atypical ? etiol. Assessment New  Orders: EKG w/ Interpretation (93000) OK TLB-B12, Serum-Total ONLY (84132-G40) TLB-BMP (Basic Metabolic Panel-BMET) (80048-METABOL) TLB-CBC Platelet - w/Differential (85025-CBCD) TLB-Hepatic/Liver Function Pnl (80076-HEPATIC) TLB-Sedimentation Rate (ESR) (85652-ESR) TLB-TSH (Thyroid Stimulating Hormone) (84443-TSH) D-Dimer- [facility] ([address]272-53664) T-CK MB Fract ([address]) T-2 View CXR, Same Day (71020.5TC)  Problem # 2:  PARESTHESIA (ICD-782.0) L Assessment: New  Orders: TLB-B12, Serum-Total ONLY (56387-F64) TLB-BMP (Basic Metabolic Panel-BMET) (80048-METABOL) TLB-CBC Platelet - w/Differential (85025-CBCD) TLB-Hepatic/Liver Function Pnl (80076-HEPATIC) TLB-Sedimentation Rate (ESR) (85652-ESR) TLB-TSH (Thyroid Stimulating Hormone) (84443-TSH) D-Dimer- [facility] ([address]95-18841) T-CK MB Fract ([address]) Radiology Referral (Radiology) MRI brain  Problem # 3:  LOW BACK PAIN, ACUTE (ICD-724.2) Assessment: New  Orders: D-Dimer- [facility] ([address]-35573) T-CK MB Fract ([address]) TLB-B12, Serum-Total ONLY (62376-E83) TLB-BMP (Basic Metabolic Panel-BMET) (80048-METABOL) TLB-CBC Platelet - w/Differential (85025-CBCD) TLB-Hepatic/Liver Function Pnl (80076-HEPATIC) TLB-Sedimentation Rate (ESR) (85652-ESR) TLB-TSH (Thyroid Stimulating Hormone) (84443-TSH)  Her updated medication list for this problem includes:    Hydrocodone-acetaminophen 5-325 Mg Tabs (Hydrocodone-acetaminophen) .Marland Kitchen... 1-2 by mouth two times a day as needed pain  Problem # 4:  COMMON MIGRAINE (ICD-346.10) Assessment: Comment Only  Her updated medication list for this problem includes:    Imitrex 100 Mg Tabs (Sumatriptan succinate) .Marland Kitchen... 1 by mouth once daily prn    Hydrocodone-acetaminophen 5-325 Mg Tabs (Hydrocodone-acetaminophen) .Marland Kitchen... 1-2 by mouth two times a day as needed pain  Complete  Medication List: 1)  Imitrex 100 Mg Tabs (Sumatriptan succinate) .Marland Kitchen.. 1 by mouth once daily prn 2)  Citalopram Hydrobromide 10 Mg Tabs (Citalopram hydrobromide) .... Take 1 tab by mouth daily 3)  Lorazepam 0.5 Mg Tabs (Lorazepam) .Marland Kitchen.. 1 by mouth two times a day as needed anxiety 4)  Vitamin D3 1000 Unit Tabs (Cholecalciferol) .Marland Kitchen.. 1 by mouth daily 5)  Multivitamins Tabs (Multiple vitamin) .... Once daily 6)  Fish Oil Caps  .Marland Kitchen.. 6 a day 7)  Ginko Biloba  .Marland Kitchen.. 4 a day 8)  Hydrocodone-acetaminophen 5-325 Mg Tabs (Hydrocodone-acetaminophen) .Marland Kitchen.. 1-2 by mouth two times a day as needed pain  Patient Instructions: 1)  Please schedule a follow-up appointment in 1 week. 2)  Call if you are not better in a reasonable amount of time or if worse. Go to ER if feeling really bad! Prescriptions: LORAZEPAM 0.5 MG TABS (LORAZEPAM) 1 by mouth two times a day as needed anxiety  #180 x 1   Entered and Authorized by:   Tresa Garter MD   Signed by:   Tresa Garter MD on 11/30/2009   Method used:   Print then Give to Patient   RxID:   [id number] HYDROCODONE-ACETAMINOPHEN 5-325 MG TABS (HYDROCODONE-ACETAMINOPHEN) 1-2 by mouth two times a day as needed pain  #60 x 0   Entered and Authorized by:   Tresa Garter MD   Signed by:   Tresa Garter MD on 11/30/2009   Method used:   Print then Give to Patient   RxID:   [id number]

## 2010-12-14 NOTE — Assessment & Plan Note (Signed)
Summary: COUGH--STC   Vital Signs:  Patient profile:   45 year old female Weight:      138 pounds Temp:     98.8 degrees F oral Pulse rate:   82 / minute BP sitting:   140 / 92  (left arm)  Vitals Entered By: Tora Perches (October 13, 2009 11:23 AM) CC: cough Is Patient Diabetic? No   CC:  cough.  History of Present Illness: The patient presents with complaints of sore throat, fever, cough, sinus congestion and drainge of 3 wksduration. Not better with OTC meds. Can't sleep due to cough. Muscle aches are not present.  The mucus is colored.   Preventive Screening-Counseling & Management  Alcohol-Tobacco     Smoking Status: never  Current Medications (verified): 1)  Imitrex 100 Mg  Tabs (Sumatriptan Succinate) .Marland Kitchen.. 1 By Mouth Once Daily Prn 2)  Citalopram Hydrobromide 10 Mg  Tabs (Citalopram Hydrobromide) .... Take 1 Tab By Mouth Daily 3)  Lorazepam 0.5 Mg Tabs (Lorazepam) .Marland Kitchen.. 1 By Mouth Two Times A Day As Needed Anxiety 4)  Vitamin D3 1000 Unit  Tabs (Cholecalciferol) .Marland Kitchen.. 1 By Mouth Daily 5)  Multivitamins   Tabs (Multiple Vitamin) .... Once Daily 6)  Fish Oil Caps .Marland Kitchen.. 6 A Day 7)  Ginko Biloba .Marland Kitchen.. 4 A Day  Allergies (verified): No Known Drug Allergies  Physical Exam  General:  obese.  no distress  Mouth:  Erythematous throat mucosa and intranasal erythema.  Lungs:  Clear to auscultation bilaterally. Normal respiratory effort.  Heart:  RRR Abdomen:  Bowel sounds positive,abdomen soft and non-tender without masses, organomegaly or hernias noted. Extremities:  No clubbing, cyanosis, edema, or deformity noted with normal full range of motion of all joints.   Skin:  Intact without suspicious lesions or rashes   Impression & Recommendations:  Problem # 1:  SINUSITIS, ACUTE (ICD-461.9) Assessment New     Zithromax Z-pak 250 Mg Tabs (Azithromycin) .Marland Kitchen... As dirrected    Promethazine-codeine 6.25-10 Mg/85ml Syrp (Promethazine-codeine) .Marland Kitchen... 5-10 ml by mouth qid as  needed cough  Problem # 2:  COUGH (ICD-786.2) Assessment: New  Complete Medication List: 1)  Imitrex 100 Mg Tabs (Sumatriptan succinate) .Marland Kitchen.. 1 by mouth once daily prn 2)  Citalopram Hydrobromide 10 Mg Tabs (Citalopram hydrobromide) .... Take 1 tab by mouth daily 3)  Lorazepam 0.5 Mg Tabs (Lorazepam) .Marland Kitchen.. 1 by mouth two times a day as needed anxiety 4)  Vitamin D3 1000 Unit Tabs (Cholecalciferol) .Marland Kitchen.. 1 by mouth daily 5)  Multivitamins Tabs (Multiple vitamin) .... Once daily 6)  Fish Oil Caps  .Marland Kitchen.. 6 a day 7)  Ginko Biloba  .Marland Kitchen.. 4 a day 8)  Zithromax Z-pak 250 Mg Tabs (Azithromycin) .... As dirrected 9)  Promethazine-codeine 6.25-10 Mg/38ml Syrp (Promethazine-codeine) .... 5-10 ml by mouth qid as needed cough  Patient Instructions: 1)  Call if you are not better in a reasonable amount of time or if worse.  Prescriptions: PROMETHAZINE-CODEINE 6.25-10 MG/5ML SYRP (PROMETHAZINE-CODEINE) 5-10 ml by mouth qid as needed cough  #300 ml x 0   Entered and Authorized by:   Tresa Garter MD   Signed by:   Tresa Garter MD on 10/13/2009   Method used:   Print then Give to Patient   RxID:   [id number] ZITHROMAX Z-PAK 250 MG TABS (AZITHROMYCIN) as dirrected  #1 x 0   Entered and Authorized by:   Tresa Garter MD   Signed by:   Tresa Garter MD on 10/13/2009  Method used:   Print then Give to Patient   RxID:   [id number]

## 2010-12-14 NOTE — Progress Notes (Signed)
Summary: RESULTS  Phone Note Call from Patient   Caller: Patient Call For: Tresa Garter MD Summary of Call: Pt requesting lab results and xray results. Please Advise Initial call taken by: Ami Bullins CMA,  December 01, 2009 4:12 PM  Follow-up for Phone Call        Labs and CXR that I have so far  are nl Follow-up by: Tresa Garter MD,  December 01, 2009 5:37 PM  Additional Follow-up for Phone Call Additional follow up Details #1::        Patient notified. Additional Follow-up by: Lucious Groves,  December 02, 2009 11:00 AM

## 2010-12-14 NOTE — Assessment & Plan Note (Signed)
Summary: DON'T LIKE THE WAY MED IS MAKING HER FEEL--D/T---STC   Vital Signs:  Patient profile:   45 year old female Height:      58 inches Weight:      135 pounds BMI:     28.32 Temp:     97.3 degrees F oral Pulse rate:   68 / minute Pulse rhythm:   regular Resp:     16 per minute BP sitting:   140 / 90  (left arm) Cuff size:   regular  Vitals Entered By: Lanier Prude, Beverly Gust) (June 29, 2010 9:56 AM) CC: fatigue, decresed libido Is Patient Diabetic? No   CC:  fatigue and decresed libido.  History of Present Illness: The patient presents for a preventive health examination  The patient presents for a follow up of  anxiety, elev. chol, headaches.   Current Medications (verified): 1)  Imitrex 100 Mg  Tabs (Sumatriptan Succinate) .Marland Kitchen.. 1 By Mouth Once Daily Prn 2)  Citalopram Hydrobromide 10 Mg  Tabs (Citalopram Hydrobromide) .... Take 1 Tab By Mouth Daily 3)  Lorazepam 0.5 Mg Tabs (Lorazepam) .Marland Kitchen.. 1 By Mouth Two Times A Day As Needed Anxiety 4)  Vitamin D3 1000 Unit  Tabs (Cholecalciferol) .Marland Kitchen.. 1 By Mouth Daily 5)  Multivitamins   Tabs (Multiple Vitamin) .... Once Daily 6)  Fish Oil Caps .Marland Kitchen.. 6 A Day 7)  Ginko Biloba .Marland Kitchen.. 4 A Day 8)  Hydrocodone-Acetaminophen 5-325 Mg Tabs (Hydrocodone-Acetaminophen) .Marland Kitchen.. 1-2 By Mouth Two Times A Day As Needed Pain  Allergies (verified): 1)  Citalopram Hydrobromide  Past History:  Past Medical History: Last updated: 11/19/2008 migraine HA OSA Hyperlipidemia glucose intolerance cerebral shunts Anxiety  Past Surgical History: Last updated: 10/01/2007 Cholecystectomy s/p pituitary cyst with subsequent shunt  Family History: Last updated: 11/19/2008 aunt with melanoma uncle with lung cancer grandmother with lung cancer grandfather with lung cancer fathe died with MI at 47 yo M dementia  Social History: Last updated: 11/19/2008 Never Smoked Alcohol use-yes Occupation: [employer] Married M is in a NH  Review of  Systems  The patient denies anorexia, fever, weight loss, weight gain, vision loss, decreased hearing, hoarseness, chest pain, syncope, dyspnea on exertion, peripheral edema, prolonged cough, headaches, hemoptysis, abdominal pain, melena, hematochezia, severe indigestion/heartburn, hematuria, incontinence, genital sores, muscle weakness, suspicious skin lesions, transient blindness, difficulty walking, depression, unusual weight change, abnormal bleeding, enlarged lymph nodes, angioedema, and breast masses.    Physical Exam  General:  NAD, obese Head:  Normocephalic and atraumatic without obvious abnormalities. No apparent alopecia or balding. Eyes:  No corneal or conjunctival inflammation noted. EOMI. Perrla. Ears:  External ear exam shows no significant lesions or deformities.  Otoscopic examination reveals clear canals, tympanic membranes are intact bilaterally without bulging, retraction, inflammation or discharge. Hearing is grossly normal bilaterally. Nose:  External nasal examination shows no deformity or inflammation. Nasal mucosa are pink and moist without lesions or exudates. Mouth:  Oral mucosa and oropharynx without lesions or exudates.  Teeth in good repair. Neck:  No deformities, masses, or tenderness noted. Chest Wall:  NT Lungs:  Normal respiratory effort, chest expands symmetrically. Lungs are clear to auscultation, no crackles or wheezes. Heart:  Normal rate and regular rhythm. S1 and S2 normal without gallop, murmur, click, rub or other extra sounds. Abdomen:  Bowel sounds positive,abdomen soft and non-tender without masses, organomegaly or hernias noted. Msk:  normal ROM, no joint tenderness, no joint swelling, no joint warmth, no redness over joints, no joint instability, and no muscle atrophy.  Pulses:  R and L carotid,radial,femoral,dorsalis pedis and posterior tibial pulses are full and equal bilaterally Extremities:  No clubbing, cyanosis, edema, or deformity noted with  normal full range of motion of all joints.   Neurologic:  Decreased sensation on L foot and L hand MS ok x 4 Romberg neg Skin:  No rash Cervical Nodes:  No lymphadenopathy noted Inguinal Nodes:  No significant adenopathy Psych:  Oriented X3 and good eye contact.     Impression & Recommendations:  Problem # 1:  HEALTH MAINTENANCE EXAM (ICD-V70.0) Assessment Improved Health and age related issues were discussed. Available screening tests and vaccinations were discussed as well. Healthy life style including good diet and exercise was discussed.  Orders: TLB-BMP (Basic Metabolic Panel-BMET) (80048-METABOL) TLB-CBC Platelet - w/Differential (85025-CBCD) TLB-Hepatic/Liver Function Pnl (80076-HEPATIC) TLB-Lipid Panel (80061-LIPID) TLB-TSH (Thyroid Stimulating Hormone) (84443-TSH) TLB-Udip ONLY (81003-UDIP)  Problem # 2:  HYPERLIPIDEMIA (ICD-272.4) Assessment: Comment Only We discussed Rx options Labs ordered  Problem # 3:  COMMON MIGRAINE (ICD-346.10) Assessment: Unchanged  Her updated medication list for this problem includes:    Imitrex 100 Mg Tabs (Sumatriptan succinate) .Marland Kitchen... 1 by mouth once daily prn    Hydrocodone-acetaminophen 5-325 Mg Tabs (Hydrocodone-acetaminophen) .Marland Kitchen... 1-2 by mouth two times a day as needed pain  Problem # 4:  ANXIETY (ICD-300.00) Assessment: Unchanged  The following medications were removed from the medication list:    Citalopram Hydrobromide 10 Mg Tabs (Citalopram hydrobromide) .Marland Kitchen... Take 1 tab by mouth daily Her updated medication list for this problem includes:    Lorazepam 0.5 Mg Tabs (Lorazepam) .Marland Kitchen... 1 by mouth two times a day as needed anxiety    Wellbutrin Sr 150 Mg Xr12h-tab (Bupropion hcl) .Marland Kitchen... 1 by mouth q am and q lunch (two times a day)  Problem # 5:  Decreased libido Assessment: Deteriorated Change antidepressant  Complete Medication List: 1)  Imitrex 100 Mg Tabs (Sumatriptan succinate) .Marland Kitchen.. 1 by mouth once daily prn 2)   Lorazepam 0.5 Mg Tabs (Lorazepam) .Marland Kitchen.. 1 by mouth two times a day as needed anxiety 3)  Vitamin D3 1000 Unit Tabs (Cholecalciferol) .Marland Kitchen.. 1 by mouth daily 4)  Multivitamins Tabs (Multiple vitamin) .... Once daily 5)  Fish Oil Caps  .Marland Kitchen.. 6 a day 6)  Ginko Biloba  .Marland Kitchen.. 4 a day 7)  Hydrocodone-acetaminophen 5-325 Mg Tabs (Hydrocodone-acetaminophen) .Marland Kitchen.. 1-2 by mouth two times a day as needed pain 8)  Wellbutrin Sr 150 Mg Xr12h-tab (Bupropion hcl) .Marland Kitchen.. 1 by mouth q am and q lunch (two times a day)  Other Orders: T-Vitamin D (25-Hydroxy) (40981-19147) TLB-B12, Serum-Total ONLY (82956-O13)  Patient Instructions: 1)  Please schedule a follow-up appointment in 3 months. Prescriptions: WELLBUTRIN SR 150 MG XR12H-TAB (BUPROPION HCL) 1 by mouth q am and q lunch (two times a day)  #60 x 6   Entered and Authorized by:   Tresa Garter MD   Signed by:   Tresa Garter MD on 06/29/2010   Method used:   Print then Give to Patient   RxID:   [id number]

## 2010-12-14 NOTE — Assessment & Plan Note (Signed)
Summary: GETS LIGHTHEADED AND NAUSEOUS WHEN HASN'T EATEN FOR 2 HRS/NWS   Vital Signs:  Patient profile:   45 year old female Height:      58 inches Weight:      132.25 pounds BMI:     27.74 O2 Sat:      97 % on Room air Temp:     98.3 degrees F oral Pulse rate:   89 / minute BP sitting:   110 / 82  (left arm) Cuff size:   regular  Vitals Entered By: Zella Ball Ewing CMA Duncan Dull) (September 03, 2010 4:10 PM)  O2 Flow:  Room air CC: Light Headed, nauseated, headaches after not eating for 2 hours/RE   CC:  Light Headed, nauseated, and headaches after not eating for 2 hours/RE.  History of Present Illness: pt of dr plotnikov, here with c/o 1-2 wks onset persistent recurring symtpoms of nausea, dizziness, headache and anxiety every few hours (usually every 2) and always seems improved with eating; no hx of glc metabolism disorder, denies polydipsia or polyuria, no hx of hepatic disease. Chart notes hx of obesity and glc intolerance.  She specifically requests eval for hypoglycemia, CBG in the office today 106, and has not incidently checked cbg's elsewhere. Pt denies CP, worsening sob, doe, wheezing, orthopnea, pnd, worsening LE edema, palps, dizziness or syncope  Pt denies new neuro symptoms such as headache, facial or extremity weakness , except as above.  No fever, wt loss, night sweats, loss of appetite or other constitutional symptoms  Denies worsening depressive symptoms, anxiety or panic.    Problems Prior to Update: 1)  Hypoglycemia  (ICD-251.2) 2)  Fatigue  (ICD-780.79) 3)  Health Maintenance Exam  (ICD-V70.0) 4)  Low Back Pain, Acute  (ICD-724.2) 5)  Paresthesia  (ICD-782.0) 6)  Chest Pain  (ICD-786.50) 7)  Cough  (ICD-786.2) 8)  Sinusitis, Acute  (ICD-461.9) 9)  Anxiety  (ICD-300.00) 10)  Leg Pain  (ICD-729.5) 11)  Foot Pain  (ICD-729.5) 12)  Abscess, Leg  (ICD-682.6) 13)  Presence of Cerebrospinal Fluid Drainage Device  (ICD-V45.2) 14)  Glucose Intolerance  (ICD-271.3) 15)   Hyperlipidemia  (ICD-272.4) 16)  Obstructive Sleep Apnea  (ICD-327.23) 17)  Common Migraine  (ICD-346.10)  Medications Prior to Update: 1)  Imitrex 100 Mg  Tabs (Sumatriptan Succinate) .Marland Kitchen.. 1 By Mouth Once Daily Prn 2)  Lorazepam 0.5 Mg Tabs (Lorazepam) .Marland Kitchen.. 1 By Mouth Two Times A Day As Needed Anxiety 3)  Vitamin D3 1000 Unit  Tabs (Cholecalciferol) .Marland Kitchen.. 1 By Mouth Daily 4)  Multivitamins   Tabs (Multiple Vitamin) .... Once Daily 5)  Fish Oil Caps .Marland Kitchen.. 6 A Day 6)  Ginko Biloba .Marland Kitchen.. 4 A Day 7)  Hydrocodone-Acetaminophen 5-325 Mg Tabs (Hydrocodone-Acetaminophen) .Marland Kitchen.. 1-2 By Mouth Two Times A Day As Needed Pain 8)  Crestor 10 Mg Tabs (Rosuvastatin Calcium) .Marland Kitchen.. 1 By Mouth Once Daily For Cholesterol 9)  Wellbutrin Sr 100 Mg Xr12h-Tab (Bupropion Hcl) .Marland Kitchen.. 1 By Mouth Two Times A Day Am and Lunch  Current Medications (verified): 1)  Imitrex 100 Mg  Tabs (Sumatriptan Succinate) .Marland Kitchen.. 1 By Mouth Once Daily Prn 2)  Lorazepam 0.5 Mg Tabs (Lorazepam) .Marland Kitchen.. 1 By Mouth Two Times A Day As Needed Anxiety 3)  Vitamin D3 1000 Unit  Tabs (Cholecalciferol) .Marland Kitchen.. 1 By Mouth Daily 4)  Multivitamins   Tabs (Multiple Vitamin) .... Once Daily 5)  Fish Oil Caps .Marland Kitchen.. 6 A Day 6)  Hydrocodone-Acetaminophen 5-325 Mg Tabs (Hydrocodone-Acetaminophen) .Marland Kitchen.. 1-2 By Mouth Two Times  A Day As Needed Pain 7)  Crestor 10 Mg Tabs (Rosuvastatin Calcium) .Marland Kitchen.. 1 By Mouth Once Daily For Cholesterol 8)  Wellbutrin Sr 100 Mg Xr12h-Tab (Bupropion Hcl) .Marland Kitchen.. 1 By Mouth Two Times A Day Am and Lunch  Allergies (verified): 1)  Citalopram Hydrobromide  Past History:  Past Medical History: Last updated: 11/19/2008 migraine HA OSA Hyperlipidemia glucose intolerance cerebral shunts Anxiety  Past Surgical History: Last updated: 10/01/2007 Cholecystectomy s/p pituitary cyst with subsequent shunt  Social History: Last updated: 11/19/2008 Never Smoked Alcohol use-yes Occupation: [employer] Married M is in a NH  Risk  Factors: Smoking Status: never (11/30/2009)  Review of Systems       all otherwise negative per pt -    Physical Exam  General:  NAD, obese Head:  Normocephalic and atraumatic without obvious abnormalities. No apparent alopecia or balding. Eyes:  No corneal or conjunctival inflammation noted. EOMI. Perrla. Ears:  External ear exam shows no significant lesions or deformities.  Otoscopic examination reveals clear canals, tympanic membranes are intact bilaterally without bulging, retraction, inflammation or discharge. Hearing is grossly normal bilaterally. Nose:  External nasal examination shows no deformity or inflammation. Nasal mucosa are pink and moist without lesions or exudates. Mouth:  Oral mucosa and oropharynx without lesions or exudates.  Teeth in good repair. Neck:  No deformities, masses, or tenderness noted. Lungs:  Normal respiratory effort, chest expands symmetrically. Lungs are clear to auscultation, no crackles or wheezes. Heart:  Normal rate and regular rhythm. S1 and S2 normal without gallop, murmur, click, rub or other extra sounds. Extremities:  No clubbing, cyanosis, edema, or deformity noted with normal full range of motion of all joints.     Impression & Recommendations:  Problem # 1:  HYPOGLYCEMIA (ICD-251.2) suggested by pt hx, has adequate sugar today, will proceed with w/u to r/o insulinoma, et al per pt request;  doubt surrepticious med use;  pt encouraged to eat more freq small meals to help ameliorate symptoms  Complete Medication List: 1)  Imitrex 100 Mg Tabs (Sumatriptan succinate) .Marland Kitchen.. 1 by mouth once daily prn 2)  Lorazepam 0.5 Mg Tabs (Lorazepam) .Marland Kitchen.. 1 by mouth two times a day as needed anxiety 3)  Vitamin D3 1000 Unit Tabs (Cholecalciferol) .Marland Kitchen.. 1 by mouth daily 4)  Multivitamins Tabs (Multiple vitamin) .... Once daily 5)  Fish Oil Caps  .Marland Kitchen.. 6 a day 6)  Hydrocodone-acetaminophen 5-325 Mg Tabs (Hydrocodone-acetaminophen) .Marland Kitchen.. 1-2 by mouth two times a  day as needed pain 7)  Crestor 10 Mg Tabs (Rosuvastatin calcium) .Marland Kitchen.. 1 by mouth once daily for cholesterol 8)  Wellbutrin Sr 100 Mg Xr12h-tab (Bupropion hcl) .Marland Kitchen.. 1 by mouth two times a day am and lunch  Patient Instructions: 1)  Please keep hard candy handy for low sugar symptoms 2)  On Wednesday next wk;  please return for LAB only: 3)  glucose      251.2 4)  insulin level 5)  c-peptide 6)  beta-hydroxybutyrate 7)  proinsulin 8)  cortisol 9)  Please schedule an appointment with your primary doctor 1 -2 wks after this for further followup   Orders Added: 1)  Est. Patient Level III [91478]

## 2010-12-14 NOTE — Assessment & Plan Note (Signed)
Summary: 2 WK ROV /NWS  #   Vital Signs:  Patient profile:   45 year old female Height:      58 inches Weight:      129 pounds BMI:     27.06 Temp:     98.1 degrees F oral Pulse rate:   80 / minute Pulse rhythm:   regular Resp:     16 per minute BP sitting:   128 / 98  (left arm) Cuff size:   regular  Vitals Entered By: Lanier Prude, CMA(AAMA) (September 16, 2010 9:42 AM) CC: 2 wk f/u Comments pt is not taking Vit d, Fish oil or Hydroco-Acetamin   CC:  2 wk f/u.  History of Present Illness: The patient presents for a follow up of anxiety, depression  - much better on Wellbutrin, however c/o headaches and hunger.   Current Medications (verified): 1)  Imitrex 100 Mg  Tabs (Sumatriptan Succinate) .Marland Kitchen.. 1 By Mouth Once Daily Prn 2)  Lorazepam 0.5 Mg Tabs (Lorazepam) .Marland Kitchen.. 1 By Mouth Two Times A Day As Needed Anxiety 3)  Vitamin D3 1000 Unit  Tabs (Cholecalciferol) .Marland Kitchen.. 1 By Mouth Daily 4)  Multivitamins   Tabs (Multiple Vitamin) .... Once Daily 5)  Fish Oil Caps .Marland Kitchen.. 6 A Day 6)  Hydrocodone-Acetaminophen 5-325 Mg Tabs (Hydrocodone-Acetaminophen) .Marland Kitchen.. 1-2 By Mouth Two Times A Day As Needed Pain 7)  Crestor 10 Mg Tabs (Rosuvastatin Calcium) .Marland Kitchen.. 1 By Mouth Once Daily For Cholesterol 8)  Wellbutrin Sr 100 Mg Xr12h-Tab (Bupropion Hcl) .Marland Kitchen.. 1 By Mouth Two Times A Day Am and Lunch  Allergies (verified): 1)  Citalopram Hydrobromide  Past History:  Social History: Last updated: 11/19/2008 Never Smoked Alcohol use-yes Occupation: [employer] Married M is in a NH  Past Medical History: migraine HA OSA Hyperlipidemia glucose intolerance cerebral shunts Anxiety Depression  Review of Systems  The patient denies fever, dyspnea on exertion, abdominal pain, difficulty walking, and depression.    Physical Exam  General:  NAD, obese Head:  Normocephalic and atraumatic without obvious abnormalities. No apparent alopecia or balding. Nose:  External nasal examination shows no  deformity or inflammation. Nasal mucosa are pink and moist without lesions or exudates. Mouth:  Oral mucosa and oropharynx without lesions or exudates.  Teeth in good repair. Neck:  No deformities, masses, or tenderness noted. Lungs:  Normal respiratory effort, chest expands symmetrically. Lungs are clear to auscultation, no crackles or wheezes. Heart:  Normal rate and regular rhythm. S1 and S2 normal without gallop, murmur, click, rub or other extra sounds. Abdomen:  Bowel sounds positive,abdomen soft and non-tender without masses, organomegaly or hernias noted. Msk:  normal ROM, no joint tenderness, no joint swelling, no joint warmth, no redness over joints, no joint instability, and no muscle atrophy.   Extremities:  No clubbing, cyanosis, edema, or deformity noted with normal full range of motion of all joints.   Neurologic:  Decreased sensation on L foot and L hand MS ok x 4 Romberg neg Skin:  No rash Psych:  Oriented X3 and good eye contact.     Impression & Recommendations:  Problem # 1:  DEPRESSION (ICD-311) Assessment Improved  The following medications were removed from the medication list:    Wellbutrin Sr 100 Mg Xr12h-tab (Bupropion hcl) .Marland Kitchen... 1 by mouth two times a day am and lunch Her updated medication list for this problem includes:    Lorazepam 0.5 Mg Tabs (Lorazepam) .Marland Kitchen... 1 by mouth two times a day as needed anxiety  Budeprion Xl 150 Mg Xr24h-tab (Bupropion hcl) .Marland Kitchen... 1 by mouth qam  Problem # 2:  ANXIETY (ICD-300.00) Assessment: Improved  The following medications were removed from the medication list:    Wellbutrin Sr 100 Mg Xr12h-tab (Bupropion hcl) .Marland Kitchen... 1 by mouth two times a day am and lunch Her updated medication list for this problem includes:    Lorazepam 0.5 Mg Tabs (Lorazepam) .Marland Kitchen... 1 by mouth two times a day as needed anxiety    Budeprion Xl 150 Mg Xr24h-tab (Bupropion hcl) .Marland Kitchen... 1 by mouth qam  Problem # 3:  NAUSEA (ICD-787.02) poss related to  meds Assessment: New Change antidepressant  Problem # 4:  Elev BP Assessment: New  Problem # 5:  COMMON MIGRAINE (ICD-346.10) Assessment: Unchanged  The following medications were removed from the medication list:    Hydrocodone-acetaminophen 5-325 Mg Tabs (Hydrocodone-acetaminophen) .Marland Kitchen... 1-2 by mouth two times a day as needed pain Her updated medication list for this problem includes:    Imitrex 100 Mg Tabs (Sumatriptan succinate) .Marland Kitchen... 1 by mouth once daily prn  Complete Medication List: 1)  Imitrex 100 Mg Tabs (Sumatriptan succinate) .Marland Kitchen.. 1 by mouth once daily prn 2)  Lorazepam 0.5 Mg Tabs (Lorazepam) .Marland Kitchen.. 1 by mouth two times a day as needed anxiety 3)  Vitamin D3 1000 Unit Tabs (Cholecalciferol) .Marland Kitchen.. 1 by mouth daily 4)  Crestor 10 Mg Tabs (Rosuvastatin calcium) .Marland Kitchen.. 1 by mouth once daily for cholesterol 5)  Budeprion Xl 150 Mg Xr24h-tab (Bupropion hcl) .Marland Kitchen.. 1 by mouth qam 6)  Fish Oil Caps  .Marland Kitchen.. 6 a day  Patient Instructions: 1)  Please schedule a follow-up appointment in 6 weeks. Prescriptions: BUDEPRION XL 150 MG XR24H-TAB (BUPROPION HCL) 1 by mouth qam  #30 x 6   Entered and Authorized by:   Tresa Garter MD   Signed by:   Tresa Garter MD on 09/16/2010   Method used:   Print then Give to Patient   RxID:   [id number]    Orders Added: 1)  Est. Patient Level IV [86578]

## 2010-12-14 NOTE — Progress Notes (Signed)
Summary: RF  Phone Note Refill Request Call back at Home Phone [phone]   Refills Requested: Medication #1:  LORAZEPAM 0.5 MG TABS 1 by mouth two times a day as needed anxiety TO [facility]  Initial call taken by: Lamar Sprinkles,  March 11, 2009 11:50 AM  Follow-up for Phone Call        OK x6 ref Follow-up by: Tresa Garter MD,  March 11, 2009 4:51 PM  Additional Follow-up for Phone Call Additional follow up Details #1::        faxed Additional Follow-up by: Lamar Sprinkles,  March 12, 2009 8:33 AM      Prescriptions: LORAZEPAM 0.5 MG TABS (LORAZEPAM) 1 by mouth two times a day as needed anxiety  #180 x 1   Entered by:   Lamar Sprinkles   Authorized by:   Tresa Garter MD   Signed by:   Lamar Sprinkles on 03/11/2009   Method used:   Printed then faxed to ...       [facility] [employer] education officer)       [address] [address]       [place], [place]  [address]       Ph:        Fax: [phone]   RxID:   [id number]

## 2010-12-14 NOTE — Progress Notes (Signed)
Summary: discuss medication  Phone Note Call from Patient Call back at Home Phone [phone]   Caller: Patient Summary of Call: Patient lmovm requesting a call back to discuss new medication given (Wellbutrin Initial call taken by: Rock Nephew CMA,  August 09, 2010 11:35 AM  Follow-up for Phone Call        Returned call to patient who states that she was switched to wellbutrinXR due to sleepy too much. She has now noticed only getting 6hrs of sleep/ having too much energy when taking med twice daily. She decreased medication to one daily and has noticed "deep depression". She is requesting advisement from MD  and want to know what she should do. Please advise Thanks.Marland KitchenMarland KitchenAlvy Beal Archie CMA  August 09, 2010 12:50 PM   Additional Follow-up for Phone Call Additional follow up Details #1::        We can lower the dose to 100 mg and have you take it two times a day: am and lunch Keep return office visit  Additional Follow-up by: Tresa Garter MD,  August 09, 2010 4:24 PM    Additional Follow-up for Phone Call Additional follow up Details #2::    left mess to call office back, no HIPPA form on file, can not leave vm....................Marland KitchenLamar Sprinkles, CMA  August 09, 2010 5:49 PM   Additional Follow-up for Phone Call Additional follow up Details #3:: Details for Additional Follow-up Action Taken: Pt informed  Additional Follow-up by: Lamar Sprinkles, CMA,  August 10, 2010 3:47 PM  New/Updated Medications: WELLBUTRIN SR 100 MG XR12H-TAB (BUPROPION HCL) 1 by mouth two times a day am and lunch Prescriptions: WELLBUTRIN SR 100 MG XR12H-TAB (BUPROPION HCL) 1 by mouth two times a day am and lunch  #60 x 6   Entered and Authorized by:   Tresa Garter MD   Signed by:   Lamar Sprinkles, CMA on 08/10/2010   Method used:   [facility]        [facility]       [address]       [place], [place]  [address]       Ph:  [phone]       Fax: [phone]   RxID:   [id number]

## 2010-12-14 NOTE — Progress Notes (Signed)
Summary: CRESTOR  Phone Note Call from Patient Call back at [facility] Phone [phone]   Summary of Call: Insurance prefers [employer]. Should pt change med or do PA for Lipitor?  Initial call taken by: Lamar Sprinkles, CMA,  July 09, 2010 4:32 PM  Follow-up for Phone Call        Crestor is good 10 mg a day Follow-up by: Tresa Garter MD,  July 09, 2010 5:12 PM  Additional Follow-up for Phone Call Additional follow up Details #1::        Left vm for pt to check with pharmacy. Additional Follow-up by: Lamar Sprinkles, CMA,  July 12, 2010 10:24 AM    New/Updated Medications: CRESTOR 10 MG TABS (ROSUVASTATIN CALCIUM) 1 by mouth once daily for cholesterol Prescriptions: CRESTOR 10 MG TABS (ROSUVASTATIN CALCIUM) 1 by mouth once daily for cholesterol  #90 x 3   Entered and Authorized by:   Tresa Garter MD   Signed by:   Tresa Garter MD on 07/09/2010   Method used:   Electronically to        [facility]       [address]       [place], [place]  [address]       Ph: [phone]       Fax: [phone]   RxID:   [id number]

## 2010-12-14 NOTE — Progress Notes (Signed)
Summary: Lipitor  Phone Note Call from Patient   Summary of Call: Patient is requesting rx for generic lipitor to go to [facility].  Initial call taken by: Lamar Sprinkles, CMA,  October 15, 2010 1:43 PM  Follow-up for Phone Call        ok    New/Updated Medications: LIPITOR 20 MG TABS (ATORVASTATIN CALCIUM) 1 by mouth once daily for cholesterol Prescriptions: LIPITOR 20 MG TABS (ATORVASTATIN CALCIUM) 1 by mouth once daily for cholesterol  #30 x 12   Entered and Authorized by:   Tresa Garter MD   Signed by:   Lamar Sprinkles, CMA on 10/15/2010   Method used:   Electronically to        [facility] (retail)       [address]       [place], [place]  [address]       Ph: [phone]       Fax: [phone]   RxID:   [id number]

## 2010-12-14 NOTE — Medication Information (Signed)
Summary: Citalopram/[facility]  Citalopram/[facility]   Imported By: Sherian Rein 03/27/2009 09:57:08  _____________________________________________________________________  External Attachment:    Type:   Image     Comment:   External Document

## 2010-12-14 NOTE — Progress Notes (Signed)
Summary: Lipitor  Phone Note Call from Patient   Caller: Patient Summary of Call: pt advised total chol elevated and I asked her if she wanted to try Lipitor? See 06-30-10 append on labs.  Pt reply yes to Lipitor Please advise on strength/sig on Liptor????  Initial call taken by: Lanier Prude, [facility]),  July 01, 2010 3:09 PM  Follow-up for Phone Call        ok - see meds - she can pick up $4 card RTC 3 months w/Lipids, LFT 272.2 995,20 Follow-up by: Tresa Garter MD,  July 01, 2010 7:20 PM  Additional Follow-up for Phone Call Additional follow up Details #1::        Spoke with patient and advised per MD. Pt will pick up copay card and schdule follow up.Marland KitchenMarland KitchenAlvy Beal Archie CMA  July 02, 2010 9:21 AM     New/Updated Medications: LIPITOR 10 MG TABS (ATORVASTATIN CALCIUM) 1 by mouth once daily for cholesterol Prescriptions: LIPITOR 10 MG TABS (ATORVASTATIN CALCIUM) 1 by mouth once daily for cholesterol  #30 x 12   Entered by:   Rock Nephew CMA   Authorized by:   Tresa Garter MD   Signed by:   Rock Nephew CMA on 07/02/2010   Method used:   Electronically to        [facility] (retail)       [address]       [place], [place]  [address]       Ph: [phone]       Fax: [phone]   RxID:   [id number] LIPITOR 10 MG TABS (ATORVASTATIN CALCIUM) 1 by mouth once daily for cholesterol  #30 x 12   Entered and Authorized by:   Tresa Garter MD   Signed by:   Tresa Garter MD on 07/01/2010   Method used:   Print then Give to Patient   RxID:   [id number]

## 2010-12-14 NOTE — Assessment & Plan Note (Signed)
Summary: FOOT PAIN /NWS   Vital Signs:  Patient Profile:   45 Years Old Female Weight:      137 pounds Temp:     97.2 degrees F oral Pulse rate:   85 / minute BP sitting:   148 / 91  (left arm)  Vitals Entered By: Tora Perches (December 12, 2007 4:34 PM)                 Chief Complaint:  foot pain.  History of Present Illness: C/o L heel pain followed by L Lat shin. No injury. Pain is bad in am and at night - bad.  Current Allergies (reviewed today): No known allergies   Past Medical History:    Reviewed history from 10/01/2007 and no changes required:       migraine       OSA       Hyperlipidemia       glucose intolerance       cerebral shunts   Family History:    Reviewed history from 10/01/2007 and no changes required:       aunt with melanoma       uncle with lung cancer       grandmother with lung cancer       grandfather with lung cancer       fathe died with MI at 47 yo  Social History:    Never Smoked    Alcohol use-yes    Occupation: [employer]    Married     Physical Exam  General:     NAD Msk:     L heel and lat midfoot/forefoot are  tender w/o focal point Extremities:     No clubbing, cyanosis, edema, or deformity noted with normal full range of motion of all joints.   Skin:     clear    Impression & Recommendations:  Problem # 1:  FOOT PAIN (ICD-729.5) L Plantar fasciitis. Ice , Mobic , Darvocet. Podiatry if not better soon. Good shoes. Orders: T-Foot Left Min 3 Views (73630TC)   Problem # 2:  LEG PAIN (ICD-729.5) L Due to #1 lead to a gait change  Problem # 3:  FOOT PAIN (ICD-729.5)  Orders: T-Foot Left Min 3 Views (73630TC)   Complete Medication List: 1)  Imitrex 100 Mg Tabs (Sumatriptan succinate) .[facility].. 1 by mouth once daily prn 2)  Doxycycline Hyclate 100 Mg Tabs (Doxycycline hyclate) .Marland Kitchen.. 1 by mouth two times a day 3)  Mobic 15 Mg Tabs (Meloxicam) .Marland Kitchen.. 1 by mouth once daily pc as needed x 10 d then prn 4)  Darvocet-n  100 100-650 Mg Tabs (Propoxyphene n-apap) .Marland Kitchen.. 1 by mouth four times a day as needed   Patient Instructions: 1)  Ice 2)  Arch supports    Prescriptions: DARVOCET-N 100 100-650 MG TABS (PROPOXYPHENE N-APAP) 1 by mouth four times a day as needed  #60 x 0   Entered and Authorized by:   Tresa Garter MD   Signed by:   Tresa Garter MD on 12/12/2007   Method used:   Print then Give to Patient   RxID:   [id number] MOBIC 15 MG TABS (MELOXICAM) 1 by mouth once daily pc as needed x 10 d then prn  #30 x 3   Entered and Authorized by:   Tresa Garter MD   Signed by:   Tresa Garter MD on 12/12/2007   Method used:   Print then Give to Patient  RxID:   [id number]  ]

## 2011-03-29 NOTE — Assessment & Plan Note (Signed)
[facility]                                 ON-CALL NOTE   NAME:[name], [name]                        MRN:          [id number]  DATE:11/17/2007                            DOB:          [birth year]    TELEPHONE NUMBER:  [phone].  PRIMARY CARE PHYSICIAN:  DR. Posey Rea.   SUBJECTIVE:  Nausea, vomiting, diarrhea in copious amounts.  Fever and  chills. She is not able to tolerate liquids and has been unable to  tolerate Imodium or Pepto-Bismol.  She feels like she is getting dizzy  and weak.   ASSESSMENT AND PLAN:  Instructed her to be seen at an Urgent Care  tonight for likely Phenergan injection and possible IV fluids.     Kerby Nora, MD  Electronically Signed    AB/MedQ  DD: 11/17/2007  DT: 11/17/2007  Job #: [id number]   cc:   Samantha Quint. Plotnikov, MD

## 2011-04-01 NOTE — Procedures (Signed)
NAMEMarland [name]  KOLEEN, CELIA NO.:  [id number]   MEDICAL RECORD NO.:  [id number]          PATIENT TYPE:  OUT   LOCATION:  SLEEP CENTER                 FACILITY:  [facility]   PHYSICIAN:  Marcelyn Bruins, M.D. [employer] DATE OF BIRTH:  10-01-1966   DATE OF STUDY:  12/24/2004                              NOCTURNAL POLYSOMNOGRAM   REFERRING PHYSICIAN:  Dr. Trinna Post Plotnikov   DATE OF STUDY:  December 24, 2004   INDICATION FOR THE STUDY:  Hypersomnia with sleep apnea. Epworth score is  19.   SLEEP ARCHITECTURE:  The patient had a total sleep time of 449 minutes with  a sleep efficiency of 96%. There was decreased slow wave sleep as well as  REM. Sleep onset latency was normal and REM onset was very prolonged at 217  minutes.   IMPRESSION:  1.  Mild obstructive sleep apnea/hypopnea syndrome with a respiratory      disturbance index of 9 events per hour and oxygen desaturation as low as      89%. Events were not very positional but were clearly worse during rapid      eye movement. I think the patient's respiratory disturbance index      underestimates the impact this may have on her quality of life due to      the events occurring primarily during rapid eye movement. There was also      large numbers of nonspecific arousals along with snoring which is      suggestive of the upper airway resistant syndrome.  2.  Mild snoring noted throughout the study.  3.  No clinically significant cardiac arrhythmia.  4.  Moderate numbers of leg jerks with some mild sleep disruption.      KC/MEDQ  D:  01/03/2005 11:07:04  T:  01/03/2005 18:57:36  Job:  [id number]

## 2011-04-01 NOTE — Assessment & Plan Note (Signed)
[facility]                           PRIMARY CARE OFFICE NOTE   NAME:Samantha [name], Samantha [name]                      MRN:          [id number]  DATE:11/27/2006                            DOB:          Apr 20, 1966    The patient is a 45 year old female who presents for a wellness  examination.   PAST MEDICAL HISTORY:  Migraine headaches, sleep apnea, cerebral shunts.   CURRENT MEDICATIONS:  Multivitamins.   ALLERGIES:  None.   SOCIAL HISTORY:  She is married without children.  Does not smoke, or  drink alcohol.   FAMILY HISTORY:  Father with diabetes.  Mother with hypertension.   REVIEW OF SYSTEMS:  Unable to use a CPAP machine due to dryness.  Difficulty with breathing through nose.  Occasional migraines.  The rest  of the 18-point system review is negative.   PHYSICAL:  Blood pressure 129/88, pulse 82, temperature 97.8, weight 136  pounds.  She is in no acute distress.  Looks well.  HEENT:  Moist mucosa.  NECK:  Supple.  No thyromegaly or bruit.  LUNGS:  Clear to auscultation and percussion.  No wheeze or rales.  HEART:  S1, S2.  No murmur.  No gallops.  ABDOMEN:  Soft and nontender.  No organomegaly.  No masses palpable.  LOWER EXTREMITIES:  Without edema.  She is alert, oriented, and cooperative.  Denies being depressed.  SKIN:  Clear.   LABS:  November 22, 2006.  CBC normal, glucose 108, cholesterol 291, LDL  203.  TSH 2.11.  Urinalysis normal.   ASSESSMENT AND PLAN:  1. Normal wellness examination.  Age/health issues discussed.  Healthy      life-style discussed.  Repeat exam in 12 months.  Regular      gynecologic care with her gynecologist.  2. Elevated glucose.  Lose weight.  Exercise.  Low-carb diet.  Will      see her in 6 months with hemoglobin A1C.  3. Fatigue, likely related to untreated sleep apnea.  Plan:  See      below.  4. Headache/migraines on therapy.  5. Sleep apnea.  Went off continuous positive airway pressure.  Given  a prescription for a      heated humidifier.  6. Dyslipidemia.  Will start with fish oil.  She may still be trying      to get pregnant.  Folic acid daily.  Repeat labs in 6 months.     [name], MD  Electronically Signed    AVP/MedQ  DD: 12/03/2006  DT: 12/03/2006  Job #: [id number]

## 2011-04-28 ENCOUNTER — Encounter: Payer: Self-pay | Admitting: Internal Medicine

## 2011-05-02 ENCOUNTER — Other Ambulatory Visit: Payer: Self-pay | Admitting: Internal Medicine

## 2011-05-02 ENCOUNTER — Telehealth: Payer: Self-pay | Admitting: Internal Medicine

## 2011-05-02 ENCOUNTER — Other Ambulatory Visit (INDEPENDENT_AMBULATORY_CARE_PROVIDER_SITE_OTHER): Payer: BC Managed Care – PPO

## 2011-05-02 ENCOUNTER — Encounter: Payer: Self-pay | Admitting: Internal Medicine

## 2011-05-02 ENCOUNTER — Ambulatory Visit (INDEPENDENT_AMBULATORY_CARE_PROVIDER_SITE_OTHER): Payer: BC Managed Care – PPO | Admitting: Internal Medicine

## 2011-05-02 DIAGNOSIS — G43009 Migraine without aura, not intractable, without status migrainosus: Secondary | ICD-10-CM

## 2011-05-02 DIAGNOSIS — E785 Hyperlipidemia, unspecified: Secondary | ICD-10-CM

## 2011-05-02 DIAGNOSIS — F329 Major depressive disorder, single episode, unspecified: Secondary | ICD-10-CM

## 2011-05-02 DIAGNOSIS — F3289 Other specified depressive episodes: Secondary | ICD-10-CM

## 2011-05-02 DIAGNOSIS — G4733 Obstructive sleep apnea (adult) (pediatric): Secondary | ICD-10-CM

## 2011-05-02 LAB — COMPREHENSIVE METABOLIC PANEL
ALT: 17 U/L (ref 0–35)
AST: 18 U/L (ref 0–37)
Albumin: 4.1 g/dL (ref 3.5–5.2)
Alkaline Phosphatase: 43 U/L (ref 39–117)
BUN: 12 mg/dL (ref 6–23)
CO2: 26 mEq/L (ref 19–32)
Calcium: 8.9 mg/dL (ref 8.4–10.5)
Chloride: 99 mEq/L (ref 96–112)
Creatinine, Ser: 0.9 mg/dL (ref 0.4–1.2)
GFR: 70.41 mL/min (ref >60.00)
Glucose, Bld: 85 mg/dL (ref 70–99)
Potassium: 4 mEq/L (ref 3.5–5.1)
Sodium: 138 mEq/L (ref 135–145)
Total Bilirubin: 0.6 mg/dL (ref 0.3–1.2)
Total Protein: 7.4 g/dL (ref 6.0–8.3)

## 2011-05-02 LAB — LIPID PANEL
Cholesterol: 281 mg/dL — ABNORMAL HIGH (ref 0–200)
HDL: 57.2 mg/dL (ref >39.00)
Total CHOL/HDL Ratio: 5
Triglycerides: 125 mg/dL (ref 0.0–149.0)
VLDL: 25 mg/dL (ref 0.0–40.0)

## 2011-05-02 LAB — CBC WITH DIFFERENTIAL/PLATELET
Basophils Absolute: 0 10*3/uL (ref 0.0–0.1)
Basophils Relative: 0.7 % (ref 0.0–3.0)
Eosinophils Absolute: 0.2 10*3/uL (ref 0.0–0.7)
Eosinophils Relative: 2.8 % (ref 0.0–5.0)
HCT: 36 % (ref 36.0–46.0)
Hemoglobin: 12.4 g/dL (ref 12.0–15.0)
Lymphocytes Relative: 31.9 % (ref 12.0–46.0)
Lymphs Abs: 2.1 10*3/uL (ref 0.7–4.0)
MCHC: 34.3 g/dL (ref 30.0–36.0)
MCV: 89.7 fl (ref 78.0–100.0)
Monocytes Absolute: 0.5 10*3/uL (ref 0.1–1.0)
Monocytes Relative: 7.8 % (ref 3.0–12.0)
Neutro Abs: 3.8 10*3/uL (ref 1.4–7.7)
Neutrophils Relative %: 56.8 % (ref 43.0–77.0)
Platelets: 350 10*3/uL (ref 150.0–400.0)
RBC: 4.01 Mil/uL (ref 3.87–5.11)
RDW: 14.1 % (ref 11.5–14.6)
WBC: 6.6 10*3/uL (ref 4.5–10.5)

## 2011-05-02 LAB — URINALYSIS
Bilirubin Urine: NEGATIVE
Hgb urine dipstick: NEGATIVE
Ketones, ur: NEGATIVE
Leukocytes, UA: NEGATIVE
Nitrite: NEGATIVE
Specific Gravity, Urine: 1.02 (ref 1.000–1.030)
Total Protein, Urine: NEGATIVE
Urine Glucose: NEGATIVE
Urobilinogen, UA: 0.2 (ref 0.0–1.0)
pH: 6 (ref 5.0–8.0)

## 2011-05-02 LAB — TSH: TSH: 2.7 u[IU]/mL (ref 0.35–5.50)

## 2011-05-02 LAB — LDL CHOLESTEROL, DIRECT: Direct LDL: 207.7 mg/dL

## 2011-05-02 MED ORDER — VILAZODONE HCL 20 MG PO TABS: 20.0000 mg | ORAL_TABLET | Freq: Every day | ORAL | Status: DC

## 2011-05-02 MED ORDER — SUMATRIPTAN SUCCINATE 100 MG PO TABS: 100.0000 mg | ORAL_TABLET | Freq: Every day | ORAL | Status: DC | PRN

## 2011-05-02 MED ORDER — LORAZEPAM 0.5 MG PO TABS: 0.5000 mg | ORAL_TABLET | Freq: Two times a day (BID) | ORAL | Status: DC | PRN

## 2011-05-02 NOTE — Telephone Encounter (Signed)
Samantha [name] , please, inform the patient: labs are OK, except for high cholesterol.  Please, mail the labs to the patient.      Please, keep  next office visit appointment.   Thank you !

## 2011-05-02 NOTE — Assessment & Plan Note (Signed)
Risks associated with treatment noncompliance were discussed. Compliance was encouraged. Pulm consult.

## 2011-05-02 NOTE — Progress Notes (Signed)
  Subjective:    Patient ID: Samantha [name], female    DOB: 10/13/1966, 45 y.o.   MRN: [id number]  HPI   The patient is here to follow up on chronic depression, anxiety, migraine headaches  symptoms controlled with medicines, diet and exercise. She stopped Wellbutrin - too stimulating...  Review of Systems  Constitutional: Positive for fatigue. Negative for chills, activity change, appetite change and unexpected weight change.  HENT: Negative for congestion, mouth sores and sinus pressure.        Snoring  Eyes: Negative for visual disturbance.  Respiratory: Negative for cough and chest tightness.   Gastrointestinal: Negative for nausea and abdominal pain.  Genitourinary: Negative for frequency, difficulty urinating and vaginal pain.  Musculoskeletal: Negative for back pain and gait problem.  Skin: Negative for pallor and rash.  Neurological: Negative for dizziness, tremors, weakness, numbness and headaches.  Psychiatric/Behavioral: Positive for dysphoric mood. Negative for confusion and sleep disturbance. The patient is nervous/anxious.        Objective:   Physical Exam  Constitutional: She appears well-developed and well-nourished. No distress.  HENT:  Head: Normocephalic.  Right Ear: External ear normal.  Left Ear: External ear normal.  Nose: Nose normal.  Mouth/Throat: Oropharynx is clear and moist.       Long uvula  Eyes: Conjunctivae are normal. Pupils are equal, round, and reactive to light. Right eye exhibits no discharge. Left eye exhibits no discharge.  Neck: Normal range of motion. Neck supple. No JVD present. No tracheal deviation present. No thyromegaly present.  Cardiovascular: Normal rate, regular rhythm and normal heart sounds.   Pulmonary/Chest: No stridor. No respiratory distress. She has no wheezes.  Abdominal: Soft. Bowel sounds are normal. She exhibits no distension and no mass. There is no tenderness. There is no rebound and no guarding.  Musculoskeletal:  She exhibits no edema and no tenderness.  Lymphadenopathy:    She has no cervical adenopathy.  Neurological: She displays normal reflexes. No cranial nerve deficit. She exhibits normal muscle tone. Coordination normal.  Skin: No rash noted. No erythema.  Psychiatric: She has a normal mood and affect. Her behavior is normal. Judgment and thought content normal.          Assessment & Plan:

## 2011-05-02 NOTE — Assessment & Plan Note (Signed)
We will try Viibrid

## 2011-05-02 NOTE — Assessment & Plan Note (Signed)
Imitrex prn 

## 2011-05-03 NOTE — Telephone Encounter (Signed)
Pt informed. Copies mailed 

## 2011-05-17 ENCOUNTER — Telehealth: Payer: Self-pay | Admitting: *Deleted

## 2011-05-17 ENCOUNTER — Institutional Professional Consult (permissible substitution): Payer: BC Managed Care – PPO | Admitting: Pulmonary Disease

## 2011-05-17 MED ORDER — VILAZODONE HCL 20 MG PO TABS: 20.0000 mg | ORAL_TABLET | Freq: Every day | ORAL | Status: DC

## 2011-05-17 NOTE — Telephone Encounter (Signed)
Needs rf to go to mail order pharm

## 2011-05-17 NOTE — Telephone Encounter (Signed)
Patient requesting a call back regarding a question about a medication.

## 2011-05-24 ENCOUNTER — Telehealth: Payer: Self-pay | Admitting: *Deleted

## 2011-05-24 NOTE — Telephone Encounter (Signed)
Every Lipitor-lke med would come with th same warning. She can try Rx fish oil Lovaza 2 caps bid # 120 11 ref instead Thx

## 2011-05-24 NOTE — Telephone Encounter (Signed)
I called [facility] to obatain PA for Viibryd 20mg . PA is approved from 05/24/11 until 05/23/12. Pt informed and [facility] informed.

## 2011-05-24 NOTE — Telephone Encounter (Signed)
Pt is concerned about her elevated cholesterol. She is worried that Lipitor is bad for her liver and she would like something else for chol. Please advise

## 2011-05-25 MED ORDER — OMEGA-3-ACID ETHYL ESTERS 1 G PO CAPS: 2.0000 g | ORAL_CAPSULE | Freq: Two times a day (BID) | ORAL | Status: DC

## 2011-05-25 NOTE — Telephone Encounter (Signed)
Pt informed

## 2011-06-17 ENCOUNTER — Encounter: Payer: Self-pay | Admitting: Pulmonary Disease

## 2011-06-17 ENCOUNTER — Ambulatory Visit (INDEPENDENT_AMBULATORY_CARE_PROVIDER_SITE_OTHER): Payer: BC Managed Care – PPO | Admitting: Pulmonary Disease

## 2011-06-17 VITALS — BP 120/78 | HR 79 | Temp 98.2°F | Ht <= 58 in | Wt 132.0 lb

## 2011-06-17 DIAGNOSIS — G4733 Obstructive sleep apnea (adult) (pediatric): Secondary | ICD-10-CM

## 2011-06-17 NOTE — Assessment & Plan Note (Signed)
The pt has known mild osa that is affecting her sleep and QOL, and currently is not wearing cpap due to old mask/supply and husband c/o "air blowing on him".  She is not happy with having to wear cpap, and is asking about various options.  She has seen ENT in the past, and although her anatomy is not normal, I suspect most of the issue has to due with her petite status with small posterior pharynx.  I have discussed with her surgery (not sure this will effectively treat her OSA), dental appliance, and cpap.  She is interested in the appliance, and I have given her literature to review.  She is to think about all of this, and let me know what she decides (restarting cpap vs referral to dental).  I have also encouraged her to work on weight loss.

## 2011-06-17 NOTE — Patient Instructions (Signed)
Review handouts on dental appliance and let me know what you think.  Will have to decide about restarting cpap vs dental appliance.  Please call and let me know what you decide. Work on modest weight loss

## 2011-06-17 NOTE — Progress Notes (Signed)
Subjective:     Patient ID: Samantha [name], female   DOB: 1966-09-04, 45 y.o.   MRN: [id number]  HPI The pt is a 45y/o female who I have been asked to see for re-evaluation of osa.  She was diagnosed with mild osa in 2006, with AHI 9/hr.  She was started on cpap, and did well initially.  I have not seen her since that time.  She wore cpap for a period of time, but has not used in quite some time.  Her current mask does not fit well (it is old), and her husband c/o cold air blowing on him.    -nonrestorative sleep despite sleeping 8+hrs a night -definite sleep pressure during the day with inactivity, especially at and after lunch -may take nap in afternoon when gets home from work, but falls asleep watching tv and movies. -+sleep pressure with driving. - weight down 2 pounds from last visit 2006.  Review of Systems  Constitutional: Negative for fever and unexpected weight change.  HENT: Negative for ear pain, nosebleeds, congestion, sore throat, rhinorrhea, sneezing, trouble swallowing, dental problem, postnasal drip and sinus pressure.   Eyes: Negative for redness and itching.  Respiratory: Negative for cough, chest tightness, shortness of breath and wheezing.   Cardiovascular: Negative for palpitations and leg swelling.  Gastrointestinal: Negative for nausea and vomiting.  Genitourinary: Negative for dysuria.  Musculoskeletal: Negative for joint swelling.  Skin: Negative for rash.  Neurological: Positive for headaches.  Hematological: Does not bruise/bleed easily.  Psychiatric/Behavioral: Positive for dysphoric mood. The patient is nervous/anxious.        Objective:   Physical Exam Constitutional:  Well developed, no acute distress  HENT:  Nares patent without discharge, with mild septal deviation.  Small nasal cavity  Oropharynx without exudate, palate and uvula are mildly elongated.  Mild tonsillar hypertrophy.  Eyes:  Perrla, eomi, no scleral icterus  Neck:  No JVD, no  TMG  Cardiovascular:  Normal rate, regular rhythm, no rubs or gallops.  No murmurs        Intact distal pulses  Pulmonary :  Normal breath sounds, no stridor or respiratory distress   No rales, rhonchi, or wheezing  Abdominal:  Soft, nondistended, bowel sounds present.  No tenderness noted.   Musculoskeletal:  No lower extremity edema noted.  Lymph Nodes:  No cervical lymphadenopathy noted  Skin:  No cyanosis noted  Neurologic:  Alert, appropriate, moves all 4 extremities without obvious deficit.      Assessment:      Plan:

## 2011-06-20 ENCOUNTER — Encounter: Payer: Self-pay | Admitting: Pulmonary Disease

## 2011-06-29 ENCOUNTER — Other Ambulatory Visit: Payer: Self-pay | Admitting: Internal Medicine

## 2011-07-25 ENCOUNTER — Encounter: Payer: Self-pay | Admitting: Internal Medicine

## 2011-07-25 ENCOUNTER — Ambulatory Visit (INDEPENDENT_AMBULATORY_CARE_PROVIDER_SITE_OTHER): Payer: BC Managed Care – PPO | Admitting: Internal Medicine

## 2011-07-25 DIAGNOSIS — F329 Major depressive disorder, single episode, unspecified: Secondary | ICD-10-CM

## 2011-07-25 DIAGNOSIS — E785 Hyperlipidemia, unspecified: Secondary | ICD-10-CM

## 2011-07-25 DIAGNOSIS — G43009 Migraine without aura, not intractable, without status migrainosus: Secondary | ICD-10-CM

## 2011-07-25 MED ORDER — LORAZEPAM 0.5 MG PO TABS: 0.5000 mg | ORAL_TABLET | Freq: Two times a day (BID) | ORAL | Status: DC | PRN

## 2011-07-25 MED ORDER — VILAZODONE HCL 20 MG PO TABS: 20.0000 mg | ORAL_TABLET | Freq: Every day | ORAL | Status: DC

## 2011-07-25 NOTE — Assessment & Plan Note (Signed)
Continue with current prescription therapy as reflected on the Med list.  

## 2011-07-25 NOTE — Assessment & Plan Note (Signed)
Try Fish oil

## 2011-07-25 NOTE — Patient Instructions (Signed)
ArginMax (L-arginine) 1 a day

## 2011-07-25 NOTE — Progress Notes (Signed)
  Subjective:    Patient ID: Samantha [name], female    DOB: October 26, 1966, 45 y.o.   MRN: [id number]  HPI  The patient is here to follow up on chronic depression, anxiety, headaches  symptoms controlled with medicines, diet and exercise.  L knee is  Better after a fall  Review of Systems  Constitutional: Negative for chills, activity change, appetite change, fatigue and unexpected weight change.  HENT: Negative for congestion, mouth sores and sinus pressure.   Eyes: Negative for visual disturbance.  Respiratory: Negative for cough and chest tightness.   Gastrointestinal: Negative for nausea and abdominal pain.  Genitourinary: Negative for frequency, difficulty urinating and vaginal pain.  Musculoskeletal: Positive for joint swelling and gait problem. Negative for back pain.  Skin: Negative for pallor and rash.  Neurological: Negative for dizziness, tremors, weakness, numbness and headaches.  Psychiatric/Behavioral: Negative for confusion and sleep disturbance.       Objective:   Physical Exam  Constitutional: She appears well-developed. No distress.       obese  HENT:  Head: Normocephalic.  Right Ear: External ear normal.  Left Ear: External ear normal.  Nose: Nose normal.  Mouth/Throat: Oropharynx is clear and moist.  Eyes: Conjunctivae are normal. Pupils are equal, round, and reactive to light. Right eye exhibits no discharge. Left eye exhibits no discharge.  Neck: Normal range of motion. Neck supple. No JVD present. No tracheal deviation present. No thyromegaly present.  Cardiovascular: Normal rate, regular rhythm and normal heart sounds.   Pulmonary/Chest: No stridor. No respiratory distress. She has no wheezes.  Abdominal: Soft. Bowel sounds are normal. She exhibits no distension and no mass. There is no tenderness. There is no rebound and no guarding.  Musculoskeletal: She exhibits tenderness. She exhibits no edema.       L knee  Lymphadenopathy:    She has no cervical  adenopathy.  Neurological: She displays normal reflexes. No cranial nerve deficit. She exhibits normal muscle tone. Coordination normal.  Skin: No rash noted. No erythema.  Psychiatric: She has a normal mood and affect. Her behavior is normal. Judgment and thought content normal.          Assessment & Plan:

## 2011-08-02 ENCOUNTER — Ambulatory Visit: Payer: BC Managed Care – PPO | Admitting: Internal Medicine

## 2011-10-25 ENCOUNTER — Ambulatory Visit: Payer: BC Managed Care – PPO | Admitting: Internal Medicine

## 2012-01-10 ENCOUNTER — Telehealth: Payer: Self-pay

## 2012-01-10 MED ORDER — ESCITALOPRAM OXALATE 10 MG PO TABS: 10.0000 mg | ORAL_TABLET | Freq: Every day | ORAL | Status: DC

## 2012-01-10 NOTE — Telephone Encounter (Signed)
We can try Lexapro 10 mg a day- pls call in Thx

## 2012-01-10 NOTE — Telephone Encounter (Signed)
Pt called stating that Viibryd is too expensive. Pt is requesting a cheaper alternative.

## 2012-01-12 NOTE — Telephone Encounter (Signed)
Left message on machine to return my call. 

## 2012-01-17 NOTE — Telephone Encounter (Signed)
No return call as of yet from pt//closing phone note

## 2012-01-19 ENCOUNTER — Telehealth: Payer: Self-pay | Admitting: *Deleted

## 2012-01-19 NOTE — Telephone Encounter (Signed)
Pt already rx with lexapro feb 26, which is generic  Ok to cont this

## 2012-01-19 NOTE — Telephone Encounter (Signed)
Pt left vm stating Viibryd too expensive. She is req cheaper alt. Please advise in AVP's absence. Thanks!

## 2012-01-20 MED ORDER — ESCITALOPRAM OXALATE 10 MG PO TABS: 10.0000 mg | ORAL_TABLET | Freq: Every day | ORAL | Status: DC

## 2012-01-20 NOTE — Telephone Encounter (Signed)
Patient informed prescription sent to pharmacy.

## 2012-01-20 NOTE — Telephone Encounter (Signed)
Patient stated she does not have the Lexapro that was sent in to [facility] on 01/10/2012 they no longer use that pharmacy. Please advise if ok to send the Lexapro to [facility]

## 2012-01-20 NOTE — Telephone Encounter (Signed)
Done escript 

## 2012-01-25 ENCOUNTER — Other Ambulatory Visit: Payer: Self-pay | Admitting: Gynecology

## 2012-01-25 DIAGNOSIS — R928 Other abnormal and inconclusive findings on diagnostic imaging of breast: Secondary | ICD-10-CM

## 2012-02-02 ENCOUNTER — Other Ambulatory Visit: Payer: BC Managed Care – PPO

## 2012-02-07 ENCOUNTER — Ambulatory Visit
Admission: RE | Admit: 2012-02-07 | Discharge: 2012-02-07 | Disposition: A | Discharge status: Home / Self Care | Payer: BC Managed Care – PPO | Source: Ambulatory Visit | Attending: Gynecology | Admitting: Gynecology

## 2012-02-07 DIAGNOSIS — R928 Other abnormal and inconclusive findings on diagnostic imaging of breast: Secondary | ICD-10-CM

## 2012-03-21 ENCOUNTER — Other Ambulatory Visit: Payer: Self-pay | Admitting: *Deleted

## 2012-03-21 MED ORDER — ESCITALOPRAM OXALATE 10 MG PO TABS: 10.0000 mg | ORAL_TABLET | Freq: Every day | ORAL | Status: DC

## 2012-07-11 ENCOUNTER — Telehealth: Payer: Self-pay | Admitting: *Deleted

## 2012-07-11 MED ORDER — LORAZEPAM 0.5 MG PO TABS: 0.5000 mg | ORAL_TABLET | Freq: Two times a day (BID) | ORAL | Status: DC | PRN

## 2012-07-11 NOTE — Telephone Encounter (Signed)
Rf req for Ativan 0.5 mg. Ok to Rf?

## 2012-07-11 NOTE — Telephone Encounter (Signed)
OK to fill this prescription with additional refills x0 Thank you!  

## 2012-07-11 NOTE — Telephone Encounter (Signed)
Done

## 2012-09-20 ENCOUNTER — Telehealth: Payer: Self-pay | Admitting: *Deleted

## 2012-09-20 ENCOUNTER — Encounter: Payer: Self-pay | Admitting: Internal Medicine

## 2012-09-20 ENCOUNTER — Ambulatory Visit (INDEPENDENT_AMBULATORY_CARE_PROVIDER_SITE_OTHER): Payer: BC Managed Care – PPO | Admitting: Internal Medicine

## 2012-09-20 ENCOUNTER — Other Ambulatory Visit (INDEPENDENT_AMBULATORY_CARE_PROVIDER_SITE_OTHER): Payer: BC Managed Care – PPO

## 2012-09-20 VITALS — BP 120/82 | HR 70 | Temp 99.0°F | Ht <= 58 in | Wt 135.1 lb

## 2012-09-20 DIAGNOSIS — R109 Unspecified abdominal pain: Secondary | ICD-10-CM

## 2012-09-20 DIAGNOSIS — B349 Viral infection, unspecified: Secondary | ICD-10-CM

## 2012-09-20 DIAGNOSIS — E739 Lactose intolerance, unspecified: Secondary | ICD-10-CM

## 2012-09-20 DIAGNOSIS — E785 Hyperlipidemia, unspecified: Secondary | ICD-10-CM

## 2012-09-20 DIAGNOSIS — B9789 Other viral agents as the cause of diseases classified elsewhere: Secondary | ICD-10-CM

## 2012-09-20 DIAGNOSIS — Z0000 Encounter for general adult medical examination without abnormal findings: Secondary | ICD-10-CM

## 2012-09-20 LAB — CBC WITH DIFFERENTIAL/PLATELET
Basophils Absolute: 0 10*3/uL (ref 0.0–0.1)
Basophils Relative: 0.5 % (ref 0.0–3.0)
Eosinophils Absolute: 0.1 10*3/uL (ref 0.0–0.7)
Eosinophils Relative: 2 % (ref 0.0–5.0)
HCT: 38.1 % (ref 36.0–46.0)
Hemoglobin: 12.6 g/dL (ref 12.0–15.0)
Lymphocytes Relative: 39.1 % (ref 12.0–46.0)
Lymphs Abs: 2.6 10*3/uL (ref 0.7–4.0)
MCHC: 33.1 g/dL (ref 30.0–36.0)
MCV: 89.4 fl (ref 78.0–100.0)
Monocytes Absolute: 0.5 10*3/uL (ref 0.1–1.0)
Monocytes Relative: 7 % (ref 3.0–12.0)
Neutro Abs: 3.4 10*3/uL (ref 1.4–7.7)
Neutrophils Relative %: 51.4 % (ref 43.0–77.0)
Platelets: 460 10*3/uL — ABNORMAL HIGH (ref 150.0–400.0)
RBC: 4.26 Mil/uL (ref 3.87–5.11)
RDW: 13.6 % (ref 11.5–14.6)
WBC: 6.7 10*3/uL (ref 4.5–10.5)

## 2012-09-20 LAB — BASIC METABOLIC PANEL
BUN: 12 mg/dL (ref 6–23)
CO2: 27 mEq/L (ref 19–32)
Calcium: 9.3 mg/dL (ref 8.4–10.5)
Chloride: 103 mEq/L (ref 96–112)
Creatinine, Ser: 0.9 mg/dL (ref 0.4–1.2)
GFR: 69.12 mL/min (ref >60.00)
Glucose, Bld: 93 mg/dL (ref 70–99)
Potassium: 3.9 mEq/L (ref 3.5–5.1)
Sodium: 137 mEq/L (ref 135–145)

## 2012-09-20 LAB — URINALYSIS, ROUTINE W REFLEX MICROSCOPIC
Bilirubin Urine: NEGATIVE
Hgb urine dipstick: NEGATIVE
Ketones, ur: NEGATIVE
Leukocytes, UA: NEGATIVE
Nitrite: NEGATIVE
Specific Gravity, Urine: >=1.03 (ref 1.000–1.030)
Total Protein, Urine: NEGATIVE
Urine Glucose: NEGATIVE
Urobilinogen, UA: 0.2 (ref 0.0–1.0)
pH: 6 (ref 5.0–8.0)

## 2012-09-20 LAB — HEPATIC FUNCTION PANEL
ALT: 26 U/L (ref 0–35)
AST: 23 U/L (ref 0–37)
Albumin: 3.8 g/dL (ref 3.5–5.2)
Alkaline Phosphatase: 41 U/L (ref 39–117)
Bilirubin, Direct: 0.1 mg/dL (ref 0.0–0.3)
Total Bilirubin: 0.4 mg/dL (ref 0.3–1.2)
Total Protein: 7.4 g/dL (ref 6.0–8.3)

## 2012-09-20 LAB — LIPASE: Lipase: 44 U/L (ref 11.0–59.0)

## 2012-09-20 LAB — SEDIMENTATION RATE: Sed Rate: 94 mm/hr — ABNORMAL HIGH (ref 0–22)

## 2012-09-20 MED ORDER — DIPHENOXYLATE-ATROPINE 2.5-0.025 MG PO TABS: 1.0000 | ORAL_TABLET | Freq: Four times a day (QID) | ORAL | Status: DC | PRN

## 2012-09-20 MED ORDER — ATORVASTATIN CALCIUM 20 MG PO TABS: 20.0000 mg | ORAL_TABLET | Freq: Every day | ORAL | Status: DC

## 2012-09-20 MED ORDER — ONDANSETRON HCL 4 MG PO TABS: ORAL_TABLET | ORAL | Status: DC

## 2012-09-20 NOTE — Telephone Encounter (Signed)
Note completed by MD

## 2012-09-20 NOTE — Progress Notes (Signed)
Subjective:    Patient ID: Samantha [name], female    DOB: 01-18-1966, 46 y.o.   MRN: [id number]  HPI  Here with 5-6 days onset mild HA, nausea, crampy abd pains (worse to LUQ) and loose stool turning dark after using freq pepto bismol which has not really helped; has been feeling warm but not sure about fever, denies ST, cough and Pt denies chest pain, increased sob or doe, wheezing, orthopnea, PND, increased LE swelling, palpitations, dizziness or syncope.  No back pain,  Denies urinary symptoms such as dysuria, frequency, urgency,or hematuria.  Denies worsening reflux, dysphagia, or blood.  Husband ill with ? Viral illness recent as well.    Nothing really makes better or worse.  Denies orthostatic symptoms, has been able to keep up with fluids. Of note is that she did not take the lipitor as per PCP for elev chol as husband read there was risk of liver damage.  Pt denies polydipsia, polyuria, Pt states overall o/w good compliance with meds, trying to follow lower cholesterol diet, wt overall stable but little exercise however.     Past Medical History  Diagnosis Date  . Migraine   . OSA (obstructive sleep apnea)   . Hyperlipidemia   . Glucose intolerance (impaired glucose tolerance)   . Cerebral ventricular shunt fitting or adjustment   . Anxiety   . Depression    Past Surgical History  Procedure Date  . Cholecystectomy 1981  . Pituitary cyst w/subsequent shunt     reports that she has never smoked. She has never used smokeless tobacco. She reports that she does not drink alcohol or use illicit drugs. family history includes Cancer in her mother; Dementia in her mother; Heart disease in her father; Lung cancer in her others; Melanoma in her other; and Mental illness in her mother. Allergies  Allergen Reactions  . Citalopram Hydrobromide     REACTION: low libido   Current Outpatient Prescriptions on File Prior to Visit  Medication Sig Dispense Refill  . LORazepam (ATIVAN) 0.5 MG  tablet Take 1 tablet (0.5 mg total) by mouth 2 (two) times daily as needed.  180 tablet  0  . SUMAtriptan (IMITREX) 100 MG tablet Take 1 tablet (100 mg total) by mouth daily as needed.  10 tablet  11  . atorvastatin (LIPITOR) 20 MG tablet Take 1 tablet (20 mg total) by mouth daily.  90 tablet  3  . escitalopram (LEXAPRO) 10 MG tablet Take 1 tablet (10 mg total) by mouth daily.  90 tablet  1  . omega-3 acid ethyl esters (LOVAZA) 1 G capsule Take 2 capsules (2 g total) by mouth 2 (two) times daily.  360 capsule  1  . [DISCONTINUED] LOVAZA 1 G capsule TAKE TWO CAPSULES BY MOUTH TWICE DAILY  120 each  3   Review of Systems  Constitutional: Negative for diaphoresis and unexpected weight change.  HENT: Negative for tinnitus.   Eyes: Negative for photophobia and visual disturbance.  Respiratory: Negative for choking and stridor.   Genitourinary: Negative for hematuria and decreased urine volume.  Musculoskeletal: Negative for gait problem.  Skin: Negative for color change and wound.  Neurological: Negative for tremors and numbness.  Psychiatric/Behavioral: Negative for decreased concentration. The patient is not hyperactive.       Objective:   Physical Exam BP 120/82  Pulse 70  Temp 99 F (37.2 C) (Oral)  Ht 4\' 10"  (1.473 m)  Wt 135 lb 2 oz (61.292 kg)  BMI 28.24 kg/m2  SpO2 97%  LMP 09/09/2012 Physical Exam  VS noted, mild ill Constitutional: Pt appears well-developed and well-nourished.  HENT: Head: Normocephalic.  Right Ear: External ear normal.  Left Ear: External ear normal.  Bilat tm's mild erythema.  Sinus nontender.  Pharynx mild erythema Eyes: Conjunctivae and EOM are normal. Pupils are equal, round, and reactive to light.  Neck: Normal range of motion. Neck supple.  Cardiovascular: Normal rate and regular rhythm.   Pulmonary/Chest: Effort normal and breath sounds normal.  Abd:  Soft, non-distended, + BS with diffuse tender more to the RUQ/LUQ, no guarding or  rebound Neurological: Pt is alert. Not confused  Skin: Skin is warm. No erythema.  Psychiatric: Pt behavior is normal. Thought content normal.     Assessment & Plan:

## 2012-09-20 NOTE — Assessment & Plan Note (Signed)
For eval and tx as above

## 2012-09-20 NOTE — Assessment & Plan Note (Signed)
D/w pt, risk of hepatic less than 1 in 3 million, she agrees to try lipitor 20 qd, cont low chol diet,  to f/u any worsening symptoms or concerns, and labs in 3 mo with PCP

## 2012-09-20 NOTE — Assessment & Plan Note (Signed)
Asympt, to monitor for onset polys, and f/u labs with next visit

## 2012-09-20 NOTE — Assessment & Plan Note (Signed)
C/w prob acute gastroenteritis, likely viral, but for cbc and labs today to r/o other, for anti-emetic and lomotil prn,  to f/u any worsening symptoms or concerns

## 2012-09-20 NOTE — Telephone Encounter (Signed)
Ok for note; to robin to handle 

## 2012-09-20 NOTE — Telephone Encounter (Signed)
Pt needs work note stating that she was ill on 11/05, 11/06/, 11/07 and if she can be out on 11/08.

## 2012-09-20 NOTE — Patient Instructions (Addendum)
Take all new medications as prescribed - for nausea, diarrhea and cholesterol Continue all other medications as before Please go to LAB in the Basement for the blood and/or urine tests to be done today You will be contacted by phone if any changes need to be made immediately.  Otherwise, you will receive a letter about your results with an explanation. Please remember to sign up for My Chart at your earliest convenience, as this will be important to you in the future with finding out test results. Please return in 3 mo with Lab testing done 3-5 days before with Dr Posey Rea

## 2012-10-14 ENCOUNTER — Other Ambulatory Visit: Payer: Self-pay | Admitting: Internal Medicine

## 2012-10-15 NOTE — Telephone Encounter (Signed)
Ok to Rf? 

## 2012-12-25 ENCOUNTER — Ambulatory Visit: Payer: BC Managed Care – PPO | Admitting: Internal Medicine

## 2013-01-31 ENCOUNTER — Ambulatory Visit (INDEPENDENT_AMBULATORY_CARE_PROVIDER_SITE_OTHER): Payer: BC Managed Care – PPO | Admitting: Internal Medicine

## 2013-01-31 ENCOUNTER — Encounter: Payer: Self-pay | Admitting: Internal Medicine

## 2013-01-31 VITALS — BP 120/80 | HR 77 | Temp 98.3°F | Ht <= 58 in | Wt 136.0 lb

## 2013-01-31 DIAGNOSIS — F3289 Other specified depressive episodes: Secondary | ICD-10-CM

## 2013-01-31 DIAGNOSIS — F411 Generalized anxiety disorder: Secondary | ICD-10-CM

## 2013-01-31 DIAGNOSIS — F329 Major depressive disorder, single episode, unspecified: Secondary | ICD-10-CM

## 2013-01-31 DIAGNOSIS — G43009 Migraine without aura, not intractable, without status migrainosus: Secondary | ICD-10-CM

## 2013-01-31 MED ORDER — LORAZEPAM 0.5 MG PO TABS: 0.5000 mg | ORAL_TABLET | Freq: Two times a day (BID) | ORAL | Status: DC | PRN

## 2013-01-31 MED ORDER — ATORVASTATIN CALCIUM 20 MG PO TABS: 20.0000 mg | ORAL_TABLET | Freq: Every day | ORAL | Status: DC

## 2013-01-31 MED ORDER — ESCITALOPRAM OXALATE 10 MG PO TABS: 10.0000 mg | ORAL_TABLET | Freq: Every day | ORAL | Status: DC

## 2013-01-31 MED ORDER — VITAMIN D 1000 UNITS PO TABS: 1000.0000 [IU] | ORAL_TABLET | Freq: Every day | ORAL | Status: DC

## 2013-01-31 NOTE — Progress Notes (Signed)
   Subjective:    Anxiety Symptoms include nervous/anxious behavior. Patient reports no confusion, dizziness, nausea or suicidal ideas.      The patient is here to follow up on chronic depression, anxiety, headaches  symptoms controlled with medicines, diet and exercise. Not feeling well - ran out of lexapro 1 wk ago. She had to miss 2 d of work  BP Readings from Last 3 Encounters:  01/31/13 120/80  09/20/12 120/82  07/25/11 140/98   Wt Readings from Last 3 Encounters:  01/31/13 136 lb (61.689 kg)  09/20/12 135 lb 2 oz (61.292 kg)  07/25/11 131 lb (59.421 kg)      Review of Systems  Constitutional: Negative for chills, activity change, appetite change, fatigue and unexpected weight change.  HENT: Negative for congestion, mouth sores and sinus pressure.   Eyes: Negative for visual disturbance.  Respiratory: Negative for cough and chest tightness.   Gastrointestinal: Negative for nausea and abdominal pain.  Genitourinary: Negative for frequency, difficulty urinating and vaginal pain.  Musculoskeletal: Negative for back pain, joint swelling and gait problem.  Skin: Negative for pallor and rash.  Neurological: Negative for dizziness, tremors, weakness, numbness and headaches.  Psychiatric/Behavioral: Negative for suicidal ideas, confusion and sleep disturbance. The patient is nervous/anxious.        Objective:   Physical Exam  Constitutional: She appears well-developed. No distress.  obese  HENT:  Head: Normocephalic.  Right Ear: External ear normal.  Left Ear: External ear normal.  Nose: Nose normal.  Mouth/Throat: Oropharynx is clear and moist.  Eyes: Conjunctivae are normal. Pupils are equal, round, and reactive to light. Right eye exhibits no discharge. Left eye exhibits no discharge.  Neck: Normal range of motion. Neck supple. No JVD present. No tracheal deviation present. No thyromegaly present.  Cardiovascular: Normal rate, regular rhythm and normal heart  sounds.   Pulmonary/Chest: No stridor. No respiratory distress. She has no wheezes.  Abdominal: Soft. Bowel sounds are normal. She exhibits no distension and no mass. There is no tenderness. There is no rebound and no guarding.  Musculoskeletal: She exhibits no edema and no tenderness.  Lymphadenopathy:    She has no cervical adenopathy.  Neurological: She displays normal reflexes. No cranial nerve deficit. She exhibits normal muscle tone. Coordination normal.  Skin: No rash noted. No erythema.  Psychiatric: She has a normal mood and affect. Her behavior is normal. Judgment and thought content normal.   Lab Results  Component Value Date   WBC 6.7 09/20/2012   HGB 12.6 09/20/2012   HCT 38.1 09/20/2012   PLT 460.0* 09/20/2012   GLUCOSE 93 09/20/2012   CHOL 281* 05/02/2011   TRIG 125.0 05/02/2011   HDL 57.20 05/02/2011   LDLDIRECT 207.7 05/02/2011   ALT 26 09/20/2012   AST 23 09/20/2012   NA 137 09/20/2012   K 3.9 09/20/2012   CL 103 09/20/2012   CREATININE 0.9 09/20/2012   BUN 12 09/20/2012   CO2 27 09/20/2012   TSH 2.70 05/02/2011           Assessment & Plan:

## 2013-02-07 NOTE — Assessment & Plan Note (Signed)
Re-start Rx (ran out of rx)

## 2013-02-07 NOTE — Assessment & Plan Note (Signed)
Continue with current prescription therapy as reflected on the Med list.  

## 2013-02-07 NOTE — Assessment & Plan Note (Signed)
Prn meds 

## 2013-02-12 DIAGNOSIS — Z8673 Personal history of transient ischemic attack (TIA), and cerebral infarction without residual deficits: Secondary | ICD-10-CM | POA: Insufficient documentation

## 2013-02-12 DIAGNOSIS — I639 Cerebral infarction, unspecified: Secondary | ICD-10-CM | POA: Insufficient documentation

## 2013-02-27 ENCOUNTER — Ambulatory Visit (INDEPENDENT_AMBULATORY_CARE_PROVIDER_SITE_OTHER): Payer: BC Managed Care – PPO | Admitting: Internal Medicine

## 2013-02-27 ENCOUNTER — Encounter: Payer: Self-pay | Admitting: Internal Medicine

## 2013-02-27 VITALS — BP 140/80 | HR 80 | Temp 98.0°F | Ht <= 58 in | Wt 137.2 lb

## 2013-02-27 DIAGNOSIS — E739 Lactose intolerance, unspecified: Secondary | ICD-10-CM

## 2013-02-27 DIAGNOSIS — M545 Low back pain, unspecified: Secondary | ICD-10-CM

## 2013-02-27 DIAGNOSIS — F3289 Other specified depressive episodes: Secondary | ICD-10-CM

## 2013-02-27 DIAGNOSIS — F329 Major depressive disorder, single episode, unspecified: Secondary | ICD-10-CM

## 2013-02-27 DIAGNOSIS — M5412 Radiculopathy, cervical region: Secondary | ICD-10-CM

## 2013-02-27 MED ORDER — PREDNISONE 10 MG PO TABS: ORAL_TABLET | ORAL | Status: DC

## 2013-02-27 MED ORDER — CYCLOBENZAPRINE HCL 5 MG PO TABS: 5.0000 mg | ORAL_TABLET | Freq: Three times a day (TID) | ORAL | Status: DC | PRN

## 2013-02-27 MED ORDER — TRAMADOL HCL 50 MG PO TABS
50.0000 mg | ORAL_TABLET | Freq: Four times a day (QID) | ORAL | Status: DC | PRN
Start: 2013-02-27 — End: 2013-04-19

## 2013-02-27 NOTE — Assessment & Plan Note (Signed)
Mod to severe pain with neuro change/decreased sens, for MRI, work noted given, pain med/muscle relaxer/predpack asd, and f/u PCP within 2 wks

## 2013-02-27 NOTE — Assessment & Plan Note (Signed)
stable overall by history and exam, recent data reviewed with pt, and pt to continue medical treatment as before,  to f/u any worsening symptoms or concerns, to f/u for onset polys on predpack Lab Results  Component Value Date   WBC 6.7 09/20/2012   HGB 12.6 09/20/2012   HCT 38.1 09/20/2012   PLT 460.0* 09/20/2012   GLUCOSE 93 09/20/2012   CHOL 281* 05/02/2011   TRIG 125.0 05/02/2011   HDL 57.20 05/02/2011   LDLDIRECT 207.7 05/02/2011   ALT 26 09/20/2012   AST 23 09/20/2012   NA 137 09/20/2012   K 3.9 09/20/2012   CL 103 09/20/2012   CREATININE 0.9 09/20/2012   BUN 12 09/20/2012   CO2 27 09/20/2012   TSH 2.70 05/02/2011

## 2013-02-27 NOTE — Assessment & Plan Note (Signed)
Less severe pain, but also with decreased sens to LT LLE, for tx as above, consider MRI LS spine or Head for persistent symptoms if c-spine unrevealing

## 2013-02-27 NOTE — Patient Instructions (Signed)
Please take all new medication as prescribed - the pain medication, muscle relaxer and prednisone Please continue all other medications as before You are given the work note today You will be contacted regarding the referral for: MRI for the neck Please plan to see Dr Posey Rea in 2 weeks (or sooner if needed) - the office will call

## 2013-02-27 NOTE — Progress Notes (Signed)
Subjective:    Patient ID: Samantha [name], female    DOB: 07/30/1966, 47 y.o.   MRN: [id number]  HPI  Here with 3 days onset worsening lower neck pain, worse on the left, with pain to left upper back/shoulder and numbness to left arm extending distally, similar in area that seemed to start 2 wks ago in bed when she stretched and reached for an object on the bedside table and felt a pop and pain lasting several days and seemed better until the return worse than ever now mod to severe in last 3 days;  No LUE weakness or loss of grip strength.  Also mentions similar left lower back pain with LLE numbness as well but much less severe pain, mild only, with LLE numbness but no weakness.  No HA and Pt denies new neurological symptoms such as new headache, or facial or extremity weakness as above.  No prior hx of CVA.  Denies worsening depressive symptoms, suicidal ideation, or panic; has ongoing anxiety.   Pt denies fever, wt loss, night sweats, loss of appetite, or other constitutional symptoms  Specifically asking for next 3 days off work, and work note for limitations after return to work next wk, as well as ST disability. Past Medical History  Diagnosis Date  . Migraine   . OSA (obstructive sleep apnea)   . Hyperlipidemia   . Glucose intolerance (impaired glucose tolerance)   . Cerebral ventricular shunt fitting or adjustment   . Anxiety   . Depression   . COMMON MIGRAINE 10/01/2007    Chronic    . GLUCOSE INTOLERANCE 10/01/2007    Qualifier: Diagnosis of  By: Jonny Ruiz MD, Len Blalock   . HYPERLIPIDEMIA 10/01/2007    Chronic. Lovaza is too $$$ Declined statins due to worries   . OBSTRUCTIVE SLEEP APNEA 10/01/2007    NPSG 2006:  AHI 9/hr Started cpap 2009 successfully.      Past Surgical History  Procedure Laterality Date  . Cholecystectomy  1981  . Pituitary cyst w/subsequent shunt      reports that she has never smoked. She has never used smokeless tobacco. She reports that she does not drink  alcohol or use illicit drugs. family history includes Cancer in her mother; Dementia in her mother; Heart disease in her father; Lung cancer in her others; Melanoma in her other; and Mental illness in her mother. Allergies  Allergen Reactions  . Citalopram Hydrobromide     REACTION: low libido   Current Outpatient Prescriptions on File Prior to Visit  Medication Sig Dispense Refill  . atorvastatin (LIPITOR) 20 MG tablet Take 1 tablet (20 mg total) by mouth daily.  90 tablet  3  . cholecalciferol (VITAMIN D) 1000 UNITS tablet Take 1 tablet (1,000 Units total) by mouth daily.  100 tablet  3  . escitalopram (LEXAPRO) 10 MG tablet Take 1 tablet (10 mg total) by mouth daily.  90 tablet  1  . LORazepam (ATIVAN) 0.5 MG tablet Take 1 tablet (0.5 mg total) by mouth 2 (two) times daily as needed for anxiety.  180 tablet  1  . SUMAtriptan (IMITREX) 100 MG tablet Take 1 tablet (100 mg total) by mouth daily as needed.  10 tablet  11  . omega-3 acid ethyl esters (LOVAZA) 1 G capsule Take 2 capsules (2 g total) by mouth 2 (two) times daily.  360 capsule  1   No current facility-administered medications on file prior to visit.   Review of Systems  Constitutional: Negative  for unexpected weight change, or unusual diaphoresis  HENT: Negative for tinnitus.   Eyes: Negative for photophobia and visual disturbance.  Respiratory: Negative for choking and stridor.   Gastrointestinal: Negative for vomiting and blood in stool.  Genitourinary: Negative for hematuria and decreased urine volume.  Musculoskeletal: Negative for acute joint swelling Skin: Negative for color change and wound.  Neurological: Negative for tremors and numbness other than noted  Psychiatric/Behavioral: Negative for decreased concentration or  hyperactivity.       Objective:   Physical Exam BP 140/80  Pulse 80  Temp(Src) 98 F (36.7 C) (Oral)  Ht 4\' 10"  (1.473 m)  Wt 137 lb 4 oz (62.256 kg)  BMI 28.69 kg/m2  SpO2 94% VS noted,   Constitutional: Pt appears well-developed and well-nourished.  HENT: Head: NCAT.  Right Ear: External ear normal.  Left Ear: External ear normal.  Eyes: Conjunctivae and EOM are normal. Pupils are equal, round, and reactive to light.  Neck: Normal range of motion. Neck supple.  Cardiovascular: Normal rate and regular rhythm.   Pulmonary/Chest: Effort normal and breath sounds normal.  Abd:  Soft, NT, non-distended, + BS Neurological: Pt is alert. Not confused , cn 2-12 intact, motor 5/5, with decr sens to LT to distal LUE, as well as LLE anterior thigh and lateral lower leg Spine tender to mid low c-spine only without red/tenderswelling Has some mild left lumbar paravertebral tender, no red/swelling Tenderness noted left trapezoid area as well, left shoulder NT, FROM Skin: Skin is warm. No erythema. No rash Psychiatric: Pt behavior is normal. Thought content normal. 1-2+ nervous, not depressed affect    Assessment & Plan:

## 2013-02-27 NOTE — Assessment & Plan Note (Signed)
stable overall by history and exam, , and pt to continue medical treatment as before,  to f/u any worsening symptoms or concerns  

## 2013-03-02 ENCOUNTER — Ambulatory Visit
Admission: RE | Admit: 2013-03-02 | Discharge: 2013-03-02 | Disposition: A | Discharge status: Home / Self Care | Payer: BC Managed Care – PPO | Source: Ambulatory Visit | Attending: Internal Medicine | Admitting: Internal Medicine

## 2013-03-02 DIAGNOSIS — M5412 Radiculopathy, cervical region: Secondary | ICD-10-CM

## 2013-03-03 ENCOUNTER — Other Ambulatory Visit: Payer: Self-pay | Admitting: Internal Medicine

## 2013-03-03 DIAGNOSIS — R200 Anesthesia of skin: Secondary | ICD-10-CM

## 2013-03-04 ENCOUNTER — Telehealth: Payer: Self-pay | Admitting: Internal Medicine

## 2013-03-04 ENCOUNTER — Telehealth: Payer: Self-pay

## 2013-03-04 NOTE — Telephone Encounter (Signed)
The patient would like an extension on her work note as woke this morning and still not feeling like able to go to work  Please advise

## 2013-03-04 NOTE — Telephone Encounter (Signed)
Caller: Samantha [name]/Patient; Phone: [phone]; Reason for Call: Patient is calling back into the office this afternoon needing a note Revision for work.  She states that her MRI is going to be done tomorrow, 03/05/13.  She was in office on 02/27/13 and Dr.  Yetta Barre gaver her work note for 16th, 17th, 18th and she needs it to say also for 21st and 22nd as well.  Please contact her for revised work note.  She can come by the office to pick up.  (#2) She is signing up for My chart , where does she get the activation code?

## 2013-03-04 NOTE — Telephone Encounter (Signed)
Patient informed to pick up letter  

## 2013-03-04 NOTE — Telephone Encounter (Signed)
Ok for work note for 2 days - to [employer]

## 2013-03-05 ENCOUNTER — Ambulatory Visit
Admission: RE | Admit: 2013-03-05 | Discharge: 2013-03-05 | Disposition: A | Discharge status: Home / Self Care | Payer: BC Managed Care – PPO | Source: Ambulatory Visit | Attending: Internal Medicine | Admitting: Internal Medicine

## 2013-03-05 DIAGNOSIS — R200 Anesthesia of skin: Secondary | ICD-10-CM

## 2013-03-06 ENCOUNTER — Telehealth: Payer: Self-pay

## 2013-03-06 NOTE — Telephone Encounter (Signed)
Pt called requesting results of recent MRI of brain and back

## 2013-03-06 NOTE — Telephone Encounter (Signed)
Personal  Manager Orbie Hurst) from [employer] [facility] [employer] [employer] needs clarification on work note to sit during working hours for the next 2 weeks due to pain... 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  Please advise on clarification of this.  Call back number to [employer] is [phone]

## 2013-03-06 NOTE — Telephone Encounter (Signed)
Ok for intermittent standing 50% as her recent scans were negative for acute problem requiring any surgical evaluation

## 2013-03-07 NOTE — Telephone Encounter (Signed)
Contacted [employer] informed Orbie Hurst of MD instructions on pt.

## 2013-03-14 DIAGNOSIS — I639 Cerebral infarction, unspecified: Secondary | ICD-10-CM

## 2013-03-14 HISTORY — DX: Cerebral infarction, unspecified: I63.9

## 2013-03-15 ENCOUNTER — Institutional Professional Consult (permissible substitution): Payer: BC Managed Care – PPO | Admitting: Neurology

## 2013-03-20 ENCOUNTER — Ambulatory Visit: Payer: BC Managed Care – PPO | Admitting: Internal Medicine

## 2013-04-01 ENCOUNTER — Ambulatory Visit (INDEPENDENT_AMBULATORY_CARE_PROVIDER_SITE_OTHER): Payer: BC Managed Care – PPO | Admitting: Internal Medicine

## 2013-04-01 ENCOUNTER — Other Ambulatory Visit (INDEPENDENT_AMBULATORY_CARE_PROVIDER_SITE_OTHER): Payer: BC Managed Care – PPO

## 2013-04-01 ENCOUNTER — Encounter: Payer: Self-pay | Admitting: Internal Medicine

## 2013-04-01 VITALS — BP 140/90 | HR 80 | Temp 98.5°F | Resp 16

## 2013-04-01 DIAGNOSIS — G4733 Obstructive sleep apnea (adult) (pediatric): Secondary | ICD-10-CM

## 2013-04-01 DIAGNOSIS — R209 Unspecified disturbances of skin sensation: Secondary | ICD-10-CM

## 2013-04-01 DIAGNOSIS — F329 Major depressive disorder, single episode, unspecified: Secondary | ICD-10-CM

## 2013-04-01 DIAGNOSIS — R202 Paresthesia of skin: Secondary | ICD-10-CM

## 2013-04-01 DIAGNOSIS — F3289 Other specified depressive episodes: Secondary | ICD-10-CM

## 2013-04-01 LAB — CBC WITH DIFFERENTIAL/PLATELET
Basophils Absolute: 0.1 10*3/uL (ref 0.0–0.1)
Basophils Relative: 0.7 % (ref 0.0–3.0)
Eosinophils Absolute: 0.1 10*3/uL (ref 0.0–0.7)
Eosinophils Relative: 1.7 % (ref 0.0–5.0)
HCT: 38.6 % (ref 36.0–46.0)
Hemoglobin: 13.2 g/dL (ref 12.0–15.0)
Lymphocytes Relative: 24.5 % (ref 12.0–46.0)
Lymphs Abs: 2.1 10*3/uL (ref 0.7–4.0)
MCHC: 34.1 g/dL (ref 30.0–36.0)
MCV: 87.6 fl (ref 78.0–100.0)
Monocytes Absolute: 0.6 10*3/uL (ref 0.1–1.0)
Monocytes Relative: 7.5 % (ref 3.0–12.0)
Neutro Abs: 5.5 10*3/uL (ref 1.4–7.7)
Neutrophils Relative %: 65.6 % (ref 43.0–77.0)
Platelets: 401 10*3/uL — ABNORMAL HIGH (ref 150.0–400.0)
RBC: 4.41 Mil/uL (ref 3.87–5.11)
RDW: 14.6 % (ref 11.5–14.6)
WBC: 8.4 10*3/uL (ref 4.5–10.5)

## 2013-04-01 LAB — SEDIMENTATION RATE: Sed Rate: 48 mm/hr — ABNORMAL HIGH (ref 0–22)

## 2013-04-01 LAB — VITAMIN B12: Vitamin B-12: 334 pg/mL (ref 211–911)

## 2013-04-01 LAB — BASIC METABOLIC PANEL
BUN: 12 mg/dL (ref 6–23)
CO2: 28 mEq/L (ref 19–32)
Calcium: 9.7 mg/dL (ref 8.4–10.5)
Chloride: 102 mEq/L (ref 96–112)
Creatinine, Ser: 0.9 mg/dL (ref 0.4–1.2)
GFR: 71.66 mL/min (ref >60.00)
Glucose, Bld: 94 mg/dL (ref 70–99)
Potassium: 4 mEq/L (ref 3.5–5.1)
Sodium: 138 mEq/L (ref 135–145)

## 2013-04-01 LAB — TSH: TSH: 1.85 u[IU]/mL (ref 0.35–5.50)

## 2013-04-01 LAB — HEPATIC FUNCTION PANEL
ALT: 20 U/L (ref 0–35)
AST: 22 U/L (ref 0–37)
Albumin: 3.9 g/dL (ref 3.5–5.2)
Alkaline Phosphatase: 43 U/L (ref 39–117)
Bilirubin, Direct: 0.1 mg/dL (ref 0.0–0.3)
Total Bilirubin: 0.6 mg/dL (ref 0.3–1.2)
Total Protein: 7.7 g/dL (ref 6.0–8.3)

## 2013-04-01 NOTE — Progress Notes (Signed)
Subjective:    HPI  Here for a f/u 1 mo ago acute onset of worsening lower neck pain, worse on the left, with pain to left upper back/shoulder and numbness to left arm extending distally, similar in area that seemed to start wks ago in bed when she stretched and reached for an object on the bedside table and felt a pop and pain lasting several days and seemed better until the return worse than ever now mod to severe;  No LUE weakness or loss of grip strength.  Also mentions similar left lower back pain with LLE numbness as well but much less severe pain, mild only, with LLE numbness but no weakness.  No HA and Pt denies new neurological symptoms such as new headache, or facial or extremity weakness as above.  No prior hx of CVA.  Denies worsening depressive symptoms, suicidal ideation, or panic; has ongoing anxiety.   Pt denies fever, wt loss, night sweats, loss of appetite, or other constitutional symptoms  Specifically asking for next 3 days off work, and work note for limitations after return to work next wk, as well as ST disability. H/o MS (?)  Past Medical History  Diagnosis Date  . Migraine   . OSA (obstructive sleep apnea)   . Hyperlipidemia   . Glucose intolerance (impaired glucose tolerance)   . Cerebral ventricular shunt fitting or adjustment   . Anxiety   . Depression   . COMMON MIGRAINE 10/01/2007    Chronic    . GLUCOSE INTOLERANCE 10/01/2007    Qualifier: Diagnosis of  By: Jonny Ruiz MD, Len Blalock   . HYPERLIPIDEMIA 10/01/2007    Chronic. Lovaza is too $$$ Declined statins due to worries   . OBSTRUCTIVE SLEEP APNEA 10/01/2007    NPSG 2006:  AHI 9/hr Started cpap 2009 successfully.      Past Surgical History  Procedure Laterality Date  . Cholecystectomy  1981  . Pituitary cyst w/subsequent shunt      reports that she has never smoked. She has never used smokeless tobacco. She reports that she does not drink alcohol or use illicit drugs. family history includes Cancer in her  mother; Dementia in her mother; Heart disease in her father; Lung cancer in her others; Melanoma in her other; and Mental illness in her mother. Allergies  Allergen Reactions  . Citalopram Hydrobromide     REACTION: low libido   Current Outpatient Prescriptions on File Prior to Visit  Medication Sig Dispense Refill  . atorvastatin (LIPITOR) 20 MG tablet Take 1 tablet (20 mg total) by mouth daily.  90 tablet  3  . cyclobenzaprine (FLEXERIL) 5 MG tablet Take 1 tablet (5 mg total) by mouth 3 (three) times daily as needed for muscle spasms.  60 tablet  1  . escitalopram (LEXAPRO) 10 MG tablet Take 1 tablet (10 mg total) by mouth daily.  90 tablet  1  . LORazepam (ATIVAN) 0.5 MG tablet Take 1 tablet (0.5 mg total) by mouth 2 (two) times daily as needed for anxiety.  180 tablet  1  . predniSONE (DELTASONE) 10 MG tablet 2 tabs by mouth per day for 7 days  14 tablet  0  . SUMAtriptan (IMITREX) 100 MG tablet Take 1 tablet (100 mg total) by mouth daily as needed.  10 tablet  11  . traMADol (ULTRAM) 50 MG tablet Take 1 tablet (50 mg total) by mouth every 6 (six) hours as needed for pain.  60 tablet  1  . cholecalciferol (  VITAMIN D) 1000 UNITS tablet Take 1 tablet (1,000 Units total) by mouth daily.  100 tablet  3  . omega-3 acid ethyl esters (LOVAZA) 1 G capsule Take 2 capsules (2 g total) by mouth 2 (two) times daily.  360 capsule  1   No current facility-administered medications on file prior to visit.   Review of Systems  Constitutional: Negative for unexpected weight change, or unusual diaphoresis  HENT: Negative for tinnitus.   Eyes: Negative for photophobia and visual disturbance.  Respiratory: Negative for choking and stridor.   Gastrointestinal: Negative for vomiting and blood in stool.  Genitourinary: Negative for hematuria and decreased urine volume.  Musculoskeletal: Negative for acute joint swelling Skin: Negative for color change and wound.  Neurological: Negative for tremors and  numbness other than noted  Psychiatric/Behavioral: Negative for decreased concentration or  hyperactivity.       Objective:   Physical Exam BP 140/90  Pulse 80  Temp(Src) 98.5 F (36.9 C) (Oral)  Resp 16  LMP 01/27/2013 VS noted,  Constitutional: Pt appears well-developed and well-nourished.  HENT: Head: NCAT.  Right Ear: External ear normal.  Left Ear: External ear normal.  Eyes: Conjunctivae and EOM are normal. Pupils are equal, round, and reactive to light.  Neck: Normal range of motion. Neck supple.  Cardiovascular: Normal rate and regular rhythm.   Pulmonary/Chest: Effort normal and breath sounds normal.  Abd:  Soft, NT, non-distended, + BS Neurological: Pt is alert. Not confused , cn 2-12 intact, motor 5/5, with decr sens to LT to distal LUE, as well as LLE anterior thigh and lateral lower leg Spine tender to mid low c-spine only without red/tenderswelling Has some mild left lumbar paravertebral tender, no red/swelling Tenderness noted left trapezoid area as well, left shoulder NT, FROM Skin: Skin is warm. No erythema. No rash Psychiatric: Pt behavior is normal. Thought content normal. 1-2+ nervous, not depressed affect    Assessment & Plan:

## 2013-04-01 NOTE — Assessment & Plan Note (Addendum)
4/14 ?etiol H/o ??MS vs other Brain MRI 2014 IMPRESSION:  No change from the MRI 12/04/2009. Moderate ventricular  enlargement is stable. This could be due to obstructive  hydrocephalous. Right ventricular shunt catheter unchanged.  Nodular soft tissue abnormality in the third ventricle is stable.  Adjacent to this is an area of susceptibility which could be a  metal clip based on cervical spine x-rays of 02/20/2003.  C spine MRI  Will sch a Neurol consult w/Dr Terrace Arabia Off work [employer]

## 2013-04-01 NOTE — Assessment & Plan Note (Signed)
Continue with current prescription therapy as reflected on the Med list.  

## 2013-04-01 NOTE — Assessment & Plan Note (Signed)
Re-start CPAP 

## 2013-04-02 LAB — VITAMIN D 25 HYDROXY (VIT D DEFICIENCY, FRACTURES): Vit D, 25-Hydroxy: 35 ng/mL (ref 30–89)

## 2013-04-15 ENCOUNTER — Ambulatory Visit (INDEPENDENT_AMBULATORY_CARE_PROVIDER_SITE_OTHER): Payer: BC Managed Care – PPO | Admitting: Neurology

## 2013-04-15 ENCOUNTER — Encounter: Payer: Self-pay | Admitting: Neurology

## 2013-04-15 ENCOUNTER — Telehealth: Payer: Self-pay | Admitting: Neurology

## 2013-04-15 VITALS — BP 124/89 | HR 83 | Ht <= 58 in | Wt 135.0 lb

## 2013-04-15 DIAGNOSIS — R202 Paresthesia of skin: Secondary | ICD-10-CM

## 2013-04-15 DIAGNOSIS — G911 Obstructive hydrocephalus: Secondary | ICD-10-CM

## 2013-04-15 DIAGNOSIS — G9340 Encephalopathy, unspecified: Secondary | ICD-10-CM | POA: Insufficient documentation

## 2013-04-15 DIAGNOSIS — G919 Hydrocephalus, unspecified: Secondary | ICD-10-CM

## 2013-04-15 DIAGNOSIS — G4733 Obstructive sleep apnea (adult) (pediatric): Secondary | ICD-10-CM

## 2013-04-15 DIAGNOSIS — R209 Unspecified disturbances of skin sensation: Secondary | ICD-10-CM

## 2013-04-15 DIAGNOSIS — G43009 Migraine without aura, not intractable, without status migrainosus: Secondary | ICD-10-CM

## 2013-04-15 MED ORDER — GABAPENTIN 300 MG PO CAPS: 300.0000 mg | ORAL_CAPSULE | Freq: Three times a day (TID) | ORAL | Status: DC

## 2013-04-15 NOTE — Progress Notes (Addendum)
History of present illness:  Samantha [name] is a 47 years old right-handed Caucasian female, referred by her primary care physician Dr. Posey Rea for evaluation of acute onset left-sided numbness  She had a history of pituitary cyst, was diagnosed at age 36, she presented with right visual loss, difficulty walking, short statue, she had a right frontal craniotomy, 6 weeks later, she developed worsening headaches, memory loss, was found to have obstructive hydrocephalus, she had right parietal VP shunt placement.  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,she denies significant difficulty, she does have frequent headaches, about once a month severe right-sided headaches, relieved by Imitrex,  Mar 27 2013, she got up in the morning, trying to get off the bed, she suddenly felt something pulling in her neck, in the same time, she noticed numbness in her left arm, neck, but sparing left face, she also has some subjective weakness, the dense numbness lasting for 24 hours, much improved now, but she still not back to her normal self she still has intermittent numbness tingling involving the left arm, left leg  She was evaluated by PCP the same day, complained of neck pain, MRI of the brain showed moderate enlargement of the third and lateral ventricles, unchanged. There is a right parietal shunt catheter extending into the right lateral ventricle with the tip in the temporal horn. This is unchanged. The fourth ventricle is not dilated.  Right frontal craniotomy. Encephalomalacia right frontal lobe related to prior surgery. There is enlargement of the right  frontal horn related to volume loss in the right frontal lobe  MRI cervical showed focal medial foraminal disc protrusion at C5-6.  Lateral foraminal disc osteophyte complex on the right at C7- T1.   She was given prescription of Flexeril, tramadol, without significant improvement she had a history of obstructive sleep sleep apnea, she used to use CPAP  machine, but has not had her settings checked for many years, she complains of nighttime snoring, excessive daytime fatigue, sleepiness,   Laboratory Evaluation showed normal B12, TSH, ANA, CMP, CBC, elevated ESR 47,  Review of Systems  Out of a complete 14 system review, the patient complains of only the following symptoms, and all other reviewed systems are negative.   Constitutional:   Fatigue Cardiovascular:  N/A Ear/Nose/Throat:  N/A Skin: N/A Eyes: N/A Respiratory: N/A Gastroitestinal: N/A    Hematology/Lymphatic:  N/A Endocrine:  N/A Musculoskeletal:N/A Allergy/Immunology: N/A Neurological: Memory loss, confusion, numbness, weakness, dizziness, sleepiness Psychiatric:    N/A  PHYSICAL EXAMINATOINS:  Generalized: In no acute distress  Neck: Supple, no carotid bruits   Cardiac: Regular rate rhythm  Pulmonary: Clear to auscultation bilaterally  Musculoskeletal: No deformity  Neurological examination  Mentation: Alert oriented to time, place, history taking, and causual conversation  Cranial nerve II-XII: Pupils were equal round reactive to light extraocular movements were full, visual field were full on confrontational test. facial sensation and strength were normal. hearing was intact to finger rubbing bilaterally. Uvula tongue midline.  head turning and shoulder shrug and were normal and symmetric.Tongue protrusion into cheek strength was normal.  Motor: she has mild proximal left arm weakness, 4+/5.  Sensory: Intact to fine touch, pinprick, preserved vibratory sensation, and proprioception at toes.  Coordination: Normal finger to nose, heel-to-shin bilaterally there was no truncal ataxia  Gait: Rising up from seated position without assistance, normal stance, without trunk ataxia, moderate stride, good arm swing, smooth turning, able to perform tiptoe, and heel walking without difficulty.   Romberg signs:  Negative  Deep tendon reflexes: Brachioradialis 2/2,  biceps 2/2, triceps 2/2, patellar 3/3, Achilles 2/2, plantar responses were extensor on right, flexor at left.  Assessment and plan: 47 years old Caucasian female, with past medical history pituitary cyst, status post right craniotomy, hydrocephalus, right VP shunt placement, now presenting with acute onset of left-sided numbness, on examination, she has left-sided sensory loss, involving her left face arm and neck, mild weakness on her left arm, suggestive of a right internal capsule thalamus small vessel disease, which can be missed by MRI scan,  MRI of the brain showed stable chronic changes detailed above, there was no acute lesions,  1 continue to observe her symptoms, 2 she has a history of obstructive sleep apnea, she remained to be symptomatic, we will repeat sleep study 3. Neurontin 300 mg 3 times a day, hot compression for neck pain 4 return to clinic in one month.

## 2013-04-16 DIAGNOSIS — Z0279 Encounter for issue of other medical certificate: Secondary | ICD-10-CM

## 2013-04-19 ENCOUNTER — Ambulatory Visit (INDEPENDENT_AMBULATORY_CARE_PROVIDER_SITE_OTHER): Payer: BC Managed Care – PPO | Admitting: Internal Medicine

## 2013-04-19 ENCOUNTER — Encounter: Payer: Self-pay | Admitting: Internal Medicine

## 2013-04-19 ENCOUNTER — Telehealth: Payer: Self-pay | Admitting: Neurology

## 2013-04-19 VITALS — BP 132/82 | HR 77 | Temp 98.0°F | Ht <= 58 in | Wt 136.0 lb

## 2013-04-19 DIAGNOSIS — R42 Dizziness and giddiness: Secondary | ICD-10-CM

## 2013-04-19 DIAGNOSIS — I679 Cerebrovascular disease, unspecified: Secondary | ICD-10-CM

## 2013-04-19 NOTE — Telephone Encounter (Signed)
I called and spoke with the patient and she would like to placed on cancellation list.

## 2013-04-19 NOTE — Progress Notes (Signed)
Subjective:    Patient ID: Samantha [name], female    DOB: Aug 09, 1966, 47 y.o.   MRN: [id number]  HPI  Pt presents to the clinic today with c/o dizziness and impaired balance. This started 2 weeks ago. She was seen by Dr. Posey Rea that thought she was having neurological symptoms. She was sent to Dr. Terrace Arabia where MRI of the brain was performed as well as the c spine. Both were unchanged from prior with no acute findings. Dr. Terrace Arabia thought she may have small vessel disease of the thalamus. The plan was to monitor symptoms then RTC in 1 month for follow up. She is concerned because her symptoms have not gotten better although they have not gotten worse.  Review of Systems      Past Medical History  Diagnosis Date  . Migraine   . Hyperlipidemia   . Cerebral ventricular shunt fitting or adjustment   . Anxiety   . Depression   . COMMON MIGRAINE 10/01/2007    Chronic    . GLUCOSE INTOLERANCE 10/01/2007    Qualifier: Diagnosis of  By: Jonny Ruiz MD, Len Blalock   . HYPERLIPIDEMIA 10/01/2007    Chronic. Lovaza is too $$$ Declined statins due to worries   . OBSTRUCTIVE SLEEP APNEA 10/01/2007    NPSG 2006:  AHI 9/hr Started cpap 2009 successfully.     . Paresthesia     Current Outpatient Prescriptions  Medication Sig Dispense Refill  . atorvastatin (LIPITOR) 20 MG tablet Take 1 tablet (20 mg total) by mouth daily.  90 tablet  3  . cyclobenzaprine (FLEXERIL) 5 MG tablet Take 1 tablet (5 mg total) by mouth 3 (three) times daily as needed for muscle spasms.  60 tablet  1  . escitalopram (LEXAPRO) 10 MG tablet Take 1 tablet (10 mg total) by mouth daily.  90 tablet  1  . gabapentin (NEURONTIN) 300 MG capsule Take 1 capsule (300 mg total) by mouth 3 (three) times daily.  90 capsule  6  . LORazepam (ATIVAN) 0.5 MG tablet Take 1 tablet (0.5 mg total) by mouth 2 (two) times daily as needed for anxiety.  180 tablet  1  . omega-3 acid ethyl esters (LOVAZA) 1 G capsule Take 2 capsules (2 g total) by mouth 2 (two)  times daily.  360 capsule  1  . SUMAtriptan (IMITREX) 100 MG tablet Take 1 tablet (100 mg total) by mouth daily as needed.  10 tablet  11  . traMADol (ULTRAM) 50 MG tablet Take 1 tablet (50 mg total) by mouth every 6 (six) hours as needed for pain.  60 tablet  1   No current facility-administered medications for this visit.    Allergies  Allergen Reactions  . Citalopram Hydrobromide     REACTION: low libido    Family History  Problem Relation Age of Onset  . Dementia Mother   . Mental illness Mother     Alzheimer's  . Cancer Mother     kidney ca  . Heart disease Father   . Melanoma Other   . Lung cancer Other   . Lung cancer Other   . Lung cancer Other   . Alzheimer's disease Mother   . Migraines Mother     History   Social History  . Marital Status: Married    Spouse Name: [name]    Number of Children: 0  . Years of Education: 12   Occupational History  . [employer]     Customer Service Rep  .  Social History Main Topics  . Smoking status: Never Smoker   . Smokeless tobacco: Never Used  . Alcohol Use: 0.6 oz/week    1 Glasses of wine per week     Comment: Social  . Drug Use: No  . Sexually Active: Yes   Other Topics Concern  . Not on file   Social History Narrative   Mother is in a NH.    Patient lives at home with her husband [name]). 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    High school education.   Right handed.   Caffeine 1 cup daily.   She has no children     Constitutional: Denies fever, malaise, fatigue, headache or abrupt weight changes.  HEENT: Denies eye pain, eye redness, ear pain, ringing in the ears, wax buildup, runny nose, nasal congestion, bloody nose, or sore throat. Respiratory: Denies difficulty breathing, shortness of breath, cough or sputum production.   Cardiovascular: Denies chest pain, chest tightness, palpitations or swelling in the hands or feet.  Musculoskeletal: Pt reports left side weakness. Denies decrease in  range of motion, difficulty with gait, muscle pain or joint pain and swelling.  Neurological: Pt reports dizziness. Denies  difficulty with memory, difficulty with speech or problems with balance and coordination.   No other specific complaints in a complete review of systems (except as listed in HPI above).  Objective:   Physical Exam   BP 132/82  Pulse 77  Temp(Src) 98 F (36.7 C) (Oral)  Ht 4\' 9"  (1.448 m)  Wt 136 lb (61.689 kg)  BMI 29.42 kg/m2  SpO2 97% Wt Readings from Last 3 Encounters:  04/19/13 136 lb (61.689 kg)  04/15/13 135 lb (61.236 kg)  02/27/13 137 lb 4 oz (62.256 kg)    General: Appears her stated age, well developed, well nourished in NAD. HEENT: Head: normal shape and size; Eyes: sclera white, no icterus, conjunctiva pink, PERRLA and EOMs intact; Ears: Tm's gray and intact, normal light reflex; Nose: mucosa pink and moist, septum midline; Throat/Mouth: Teeth present, mucosa pink and moist, no exudate, lesions or ulcerations noted.   Cardiovascular: Normal rate and rhythm. S1,S2 noted.  No murmur, rubs or gallops noted. No JVD or BLE edema. No carotid bruits noted. Pulmonary/Chest: Normal effort and positive vesicular breath sounds. No respiratory distress. No wheezes, rales or ronchi noted.  Musculoskeletal: Normal range of motion. No signs of joint swelling. No difficulty with gait. Strength 5/5 BUE. Strength 5/5 RLE, 4/5 LLE. Neurological: Alert and oriented. Cranial nerves II-XII intact. Coordination normal. +DTRs bilaterally. BMET    Component Value Date/Time   NA 138 04/01/2013 1100   K 4.0 04/01/2013 1100   CL 102 04/01/2013 1100   CO2 28 04/01/2013 1100   GLUCOSE 94 04/01/2013 1100   GLUCOSE 108* 11/22/2006 0728   BUN 12 04/01/2013 1100   CREATININE 0.9 04/01/2013 1100   CALCIUM 9.7 04/01/2013 1100   GFRNONAA 76.46 06/29/2010 1025    Lipid Panel     Component Value Date/Time   CHOL 281* 05/02/2011 0908   TRIG 125.0 05/02/2011 0908   HDL 57.20 05/02/2011  0908   CHOLHDL 5 05/02/2011 0908   VLDL 25.0 05/02/2011 0908    CBC    Component Value Date/Time   WBC 8.4 04/01/2013 1100   RBC 4.41 04/01/2013 1100   HGB 13.2 04/01/2013 1100   HCT 38.6 04/01/2013 1100   PLT 401.0* 04/01/2013 1100   MCV 87.6 04/01/2013 1100   MCHC 34.1 04/01/2013 1100  RDW 14.6 04/01/2013 1100   LYMPHSABS 2.1 04/01/2013 1100   MONOABS 0.6 04/01/2013 1100   EOSABS 0.1 04/01/2013 1100   BASOSABS 0.1 04/01/2013 1100    Hgb A1C No results found for this basename: HGBA1C        Assessment & Plan:   Symptoms associated with small vessel disease of the thalamus:  Call Dr. Terrace Arabia to see if you can schedule an earlier follow up with her to discuss your issues

## 2013-04-19 NOTE — Patient Instructions (Signed)

## 2013-04-22 ENCOUNTER — Telehealth: Payer: Self-pay | Admitting: Neurology

## 2013-04-23 ENCOUNTER — Telehealth: Payer: Self-pay | Admitting: *Deleted

## 2013-04-23 NOTE — Telephone Encounter (Signed)
Pt left vm stating neurology wants her to be out of work until end of July. Does she need this written from PCP or neuro?

## 2013-04-23 NOTE — Telephone Encounter (Signed)
Spoke to pt. Answered as many questions as possible about last OV and MRI findings. Pt states she is still in pain and has been taking gabapentin 4 times daily rather than 3 times daily as prescribed because of pain. Advised would inform Dr. Terrace Arabia.

## 2013-04-26 NOTE — Telephone Encounter (Signed)
Same questions were addressed during her visit, keep follow up in July

## 2013-04-28 ENCOUNTER — Other Ambulatory Visit: Payer: Self-pay | Admitting: Internal Medicine

## 2013-04-29 ENCOUNTER — Encounter: Payer: Self-pay | Admitting: Internal Medicine

## 2013-04-29 ENCOUNTER — Ambulatory Visit (INDEPENDENT_AMBULATORY_CARE_PROVIDER_SITE_OTHER): Payer: BC Managed Care – PPO | Admitting: Internal Medicine

## 2013-04-29 ENCOUNTER — Telehealth: Payer: Self-pay | Admitting: *Deleted

## 2013-04-29 ENCOUNTER — Encounter: Payer: Self-pay | Admitting: *Deleted

## 2013-04-29 ENCOUNTER — Telehealth: Payer: Self-pay | Admitting: Internal Medicine

## 2013-04-29 VITALS — BP 120/80 | HR 76 | Temp 97.6°F | Resp 16

## 2013-04-29 DIAGNOSIS — R209 Unspecified disturbances of skin sensation: Secondary | ICD-10-CM

## 2013-04-29 DIAGNOSIS — E785 Hyperlipidemia, unspecified: Secondary | ICD-10-CM

## 2013-04-29 DIAGNOSIS — M79609 Pain in unspecified limb: Secondary | ICD-10-CM

## 2013-04-29 DIAGNOSIS — R202 Paresthesia of skin: Secondary | ICD-10-CM

## 2013-04-29 DIAGNOSIS — G4733 Obstructive sleep apnea (adult) (pediatric): Secondary | ICD-10-CM

## 2013-04-29 MED ORDER — ASPIRIN 325 MG PO TABS: 325.0000 mg | ORAL_TABLET | Freq: Every day | ORAL | Status: DC

## 2013-04-29 NOTE — Telephone Encounter (Signed)
Disability forms and Jury excuse letter are upfront for p/u. Pt informed

## 2013-04-29 NOTE — Progress Notes (Signed)
Subjective:    HPI  Here for a f/u 1.5 mo ago acute onset of worsening lower neck pain, worse on the left, with pain to left upper back/shoulder and numbness to left arm extending distally, similar in area that seemed to start wks ago in bed when she stretched and reached for an object on the bedside table and felt a pop and pain lasting several days and seemed better until the return worse than ever now mod to severe;  No LUE weakness or loss of grip strength.  Also mentions similar left lower back pain with LLE numbness as well but much less severe pain, mild only, with LLE numbness but no weakness.  No HA and Pt denies new neurological symptoms such as new headache, or facial or extremity weakness as above.  No prior hx of CVA.  Denies worsening depressive symptoms, suicidal ideation, or panic; has ongoing anxiety.   Pt denies fever, wt loss, night sweats, loss of appetite, or other constitutional symptoms  Specifically asking for next 3 days off work, and work note for limitations after return to work next wk, as well as ST disability. H/o MS (?)  Dr Terrace Arabia: sx's suggestive of a right internal capsule thalamus small vessel disease, which can be missed by MRI scan, MRI of the brain showed stable chronic changes detailed above, there was no acute lesions     Past Medical History  Diagnosis Date  . Migraine   . Hyperlipidemia   . Cerebral ventricular shunt fitting or adjustment   . Anxiety   . Depression   . COMMON MIGRAINE 10/01/2007    Chronic    . GLUCOSE INTOLERANCE 10/01/2007    Qualifier: Diagnosis of  By: Jonny Ruiz MD, Len Blalock   . HYPERLIPIDEMIA 10/01/2007    Chronic. Lovaza is too $$$ Declined statins due to worries   . OBSTRUCTIVE SLEEP APNEA 10/01/2007    NPSG 2006:  AHI 9/hr Started cpap 2009 successfully.     . Paresthesia    Past Surgical History  Procedure Laterality Date  . Cholecystectomy  1981  . Pituitary cyst w/subsequent shunt      reports that she has never smoked.  She has never used smokeless tobacco. She reports that she drinks about 0.6 ounces of alcohol per week. She reports that she does not use illicit drugs. family history includes Alzheimer's disease in her mother; Cancer in her mother; Dementia in her mother; Heart disease in her father; Lung cancer in her others; Melanoma in her other; Mental illness in her mother; and Migraines in her mother. Allergies  Allergen Reactions  . Citalopram Hydrobromide     REACTION: low libido   Current Outpatient Prescriptions on File Prior to Visit  Medication Sig Dispense Refill  . atorvastatin (LIPITOR) 20 MG tablet Take 1 tablet (20 mg total) by mouth daily.  90 tablet  3  . cyclobenzaprine (FLEXERIL) 5 MG tablet Take 1 tablet (5 mg total) by mouth 3 (three) times daily as needed for muscle spasms.  60 tablet  1  . escitalopram (LEXAPRO) 10 MG tablet TAKE 1 TABLET (10 MG TOTAL) BY MOUTH DAILY.  90 tablet  1  . gabapentin (NEURONTIN) 300 MG capsule Take 1 capsule (300 mg total) by mouth 3 (three) times daily.  90 capsule  6  . LORazepam (ATIVAN) 0.5 MG tablet Take 1 tablet (0.5 mg total) by mouth 2 (two) times daily as needed for anxiety.  180 tablet  1  . SUMAtriptan (IMITREX) 100  MG tablet Take 1 tablet (100 mg total) by mouth daily as needed.  10 tablet  11  . omega-3 acid ethyl esters (LOVAZA) 1 G capsule Take 2 capsules (2 g total) by mouth 2 (two) times daily.  360 capsule  1   No current facility-administered medications on file prior to visit.   Review of Systems  Constitutional: Negative for unexpected weight change, or unusual diaphoresis  HENT: Negative for tinnitus.   Eyes: Negative for photophobia and visual disturbance.  Respiratory: Negative for choking and stridor.   Gastrointestinal: Negative for vomiting and blood in stool.  Genitourinary: Negative for hematuria and decreased urine volume.  Musculoskeletal: Negative for acute joint swelling Skin: Negative for color change and wound.   Neurological: Negative for tremors and numbness other than noted  Psychiatric/Behavioral: Negative for decreased concentration or  hyperactivity.       Objective:   Physical Exam BP 120/80  Pulse 76  Temp(Src) 97.6 F (36.4 C) (Oral)  Resp 16 VS noted,  Constitutional: Pt appears well-developed and well-nourished.  HENT: Head: NCAT.  Right Ear: External ear normal.  Left Ear: External ear normal.  Eyes: Conjunctivae and EOM are normal. Pupils are equal, round, and reactive to light.  Neck: Normal range of motion. Neck supple.  Cardiovascular: Normal rate and regular rhythm.   Pulmonary/Chest: Effort normal and breath sounds normal.  Abd:  Soft, NT, non-distended, + BS Neurological: Pt is alert. Not confused , cn 2-12 intact, motor 5/5, with decr sens to LT to distal LUE, as well as LLE anterior thigh and lateral lower leg Spine tender to mid low c-spine only without red/tenderswelling Has some mild left lumbar paravertebral tender, no red/swelling Tenderness noted left trapezoid area as well, left shoulder NT, FROM Skin: Skin is warm. No erythema. No rash Psychiatric: Pt behavior is normal. Thought content normal. 1-2+ nervous, not depressed affect    Assessment & Plan:

## 2013-04-29 NOTE — Telephone Encounter (Signed)
[name], please, send Thx

## 2013-04-29 NOTE — Assessment & Plan Note (Signed)
Dr Terrace Arabia: sx's suggestive of a right internal capsule thalamus small vessel disease, which can be missed by MRI scan, MRI of the brain showed stable chronic changes detailed above, there was no acute lesions  Start ASA

## 2013-04-29 NOTE — Assessment & Plan Note (Signed)
-   Hold Lipitor 

## 2013-04-29 NOTE — Telephone Encounter (Signed)
Samantha [name] is requesting that an extension to her FMLA to Aug 1 to be sent in to her employer.

## 2013-04-29 NOTE — Assessment & Plan Note (Signed)
Declined statins due to worries before - now is on Lipitor

## 2013-04-29 NOTE — Assessment & Plan Note (Signed)
Pulm cons Dr Shelle Iron

## 2013-04-30 NOTE — Telephone Encounter (Signed)
I sent them to be scanned and gave original to patient. Our copies have not been scanned in to EMR yet.

## 2013-05-01 ENCOUNTER — Telehealth: Payer: Self-pay

## 2013-05-01 NOTE — Telephone Encounter (Signed)
Phone call from [employer] (patients employer) [phone] ext [phone] stating an end date is needed on patients FMLA paperwork.

## 2013-05-01 NOTE — Telephone Encounter (Signed)
FMLA papers completed with out of work date until 06/14/13.

## 2013-05-02 ENCOUNTER — Telehealth: Payer: Self-pay

## 2013-05-02 NOTE — Telephone Encounter (Signed)
I received disability forms for patient that were nearly illegible. I called on April 26 2013 to request new forms be faxed and was advised to use the forms we have if we are able to read them at all. I completed the forms to the best of my ability but the quality was very poor. I have left a VM of Skipper Cliche. At [facility] today requesting that she refax the forms to [phone].

## 2013-05-02 NOTE — Telephone Encounter (Signed)
To work on 06/14/13 if ok Thx

## 2013-05-03 ENCOUNTER — Other Ambulatory Visit: Payer: Self-pay | Admitting: Internal Medicine

## 2013-05-07 NOTE — Telephone Encounter (Signed)
Notified pharmacy spoke with [facility] gave md approval,,,lmb

## 2013-05-10 ENCOUNTER — Telehealth: Payer: Self-pay | Admitting: *Deleted

## 2013-05-10 DIAGNOSIS — Z0289 Encounter for other administrative examinations: Secondary | ICD-10-CM

## 2013-05-10 NOTE — Telephone Encounter (Signed)
She will return in July 7th, further discussion on follow up

## 2013-05-10 NOTE — Telephone Encounter (Signed)
Dr. Terrace Arabia patient is on  Neurontin 1 cap tid. Dr. Posey Rea said that she could take qid, but it is not helping her pain and is wondering if someone could call in something else. Please advise best contact number [phone]

## 2013-05-13 ENCOUNTER — Telehealth: Payer: Self-pay | Admitting: Neurology

## 2013-05-14 NOTE — Telephone Encounter (Signed)
Spoke to patient. Says she is still in pain. Advised of MD's previous notes and confirmed appt is on 05/20/13. Patient questioned disability forms. Explained previous notes about forms.  Patient agreed.

## 2013-05-20 ENCOUNTER — Encounter: Payer: Self-pay | Admitting: Neurology

## 2013-05-20 ENCOUNTER — Ambulatory Visit (INDEPENDENT_AMBULATORY_CARE_PROVIDER_SITE_OTHER): Payer: BC Managed Care – PPO | Admitting: Neurology

## 2013-05-20 VITALS — BP 141/93 | HR 77 | Wt 138.0 lb

## 2013-05-20 DIAGNOSIS — F329 Major depressive disorder, single episode, unspecified: Secondary | ICD-10-CM

## 2013-05-20 DIAGNOSIS — G919 Hydrocephalus, unspecified: Secondary | ICD-10-CM

## 2013-05-20 DIAGNOSIS — F3289 Other specified depressive episodes: Secondary | ICD-10-CM

## 2013-05-20 DIAGNOSIS — F411 Generalized anxiety disorder: Secondary | ICD-10-CM

## 2013-05-20 DIAGNOSIS — G43009 Migraine without aura, not intractable, without status migrainosus: Secondary | ICD-10-CM

## 2013-05-20 DIAGNOSIS — G911 Obstructive hydrocephalus: Secondary | ICD-10-CM

## 2013-05-20 NOTE — Progress Notes (Signed)
History of present illness:  Samantha [name] is a 47 years old right-handed Caucasian female, referred by her primary care physician Dr. Posey Rea for evaluation of acute onset left-sided numbness, last visit was June 30th 2014.  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,she denies significant difficulty, she does have frequent headaches, about once a month severe right-sided headaches, relieved by Imitrex,  Mar 27 2013, she got up in the morning, trying to get off the bed, she suddenly felt something pulling in her neck, in the same time, she noticed numbness in her left arm, neck, but sparing left face, she also has some subjective weakness, the dense numbness lasting for 24 hours, much improved now, but she still not back to her normal self she still has intermittent numbness tingling involving the left arm, left leg  She was evaluated by PCP the same day, complained of neck pain, MRI of the brain showed moderate enlargement of the third and lateral ventricles, unchanged. There is a right parietal shunt catheter extending into the right lateral ventricle with the tip in the temporal horn. This is unchanged. The fourth ventricle is not dilated.  Right frontal craniotomy. Encephalomalacia right frontal lobe related to prior surgery. There is enlargement of the right  frontal horn related to volume loss in the right frontal lobe  MRI cervical showed focal medial foraminal disc protrusion at C5-6.  Lateral foraminal disc osteophyte complex on the right at C7- T1.   She was given prescription of Flexeril, tramadol, without significant improvement she had a history of  obstructive sleep sleep apnea, she used to use CPAP machine, but has not had her settings checked for many years, she complains of nighttime snoring, excessive daytime fatigue, sleepiness,   Laboratory Evaluation showed normal B12, TSH, ANA, CMP, CBC, elevated ESR 47,  UPDATE July 49th 2014;  She complains of neck pain, radiating pain to occipital region, which is constant, she has taken Flexeril, tramadol, which only help her mildly,  She could not sit up or stand up for prolonged period of time.  She denies gait difficulty, she has trouble with her left eye, transient vision blackout, when she rub it and the vision comes back, it lasts 4-5 second, she has no headaches, wake up in the middle of the night, pain at her feet and legs, improved after stopping Lipitor.   Review of Systems  Out of a complete 14 system review, the patient complains of only the following symptoms, and all other reviewed systems are negative.   Constitutional:   Fatigue Cardiovascular:  N/A Ear/Nose/Throat:  N/A Skin: N/A Eyes: N/A Respiratory: N/A Gastroitestinal: N/A    Hematology/Lymphatic:  N/A Endocrine:  N/A Musculoskeletal:N/A Allergy/Immunology: N/A Neurological: Memory loss, confusion, numbness, weakness, dizziness, sleepiness Psychiatric:    N/A  PHYSICAL EXAMINATOINS:  Generalized: In no acute distress  Neck: Supple, no carotid bruits   Cardiac: Regular rate rhythm  Pulmonary: Clear to auscultation bilaterally  Musculoskeletal: No deformity  Neurological examination  Mentation: Alert oriented to time, place, history taking, and causual conversation  Cranial nerve II-XII: Pupils were equal round reactive to light extraocular movements were full, visual field were full on confrontational test.  facial sensation and strength were normal. hearing was intact to finger rubbing bilaterally. Uvula tongue midline.  head turning and shoulder shrug and were normal and symmetric.Tongue protrusion into  cheek strength was normal.  Motor: she has mild proximal left arm weakness, 4+/5.  Sensory: Intact to fine touch, pinprick, preserved vibratory sensation, and proprioception at toes.  Coordination: Normal finger to nose, heel-to-shin bilaterally there was no truncal ataxia  Gait: Rising up from seated position without assistance, normal stance, without trunk ataxia, moderate stride, good arm swing, smooth turning, able to perform tiptoe, and heel walking without difficulty.   Romberg signs: Negative  Deep tendon reflexes: Brachioradialis 2/2, biceps 2/2, triceps 2/2, patellar 3/3, Achilles 2/2, plantar responses were extensor on right, flexor at left.  Assessment and plan: 47 years old Caucasian female, with past medical history pituitary cyst, status post right craniotomy, hydrocephalus, right VP shunt placement, now presenting with acute onset of left-sided numbness.  MRI of the brain showed stable chronic changes detailed above, there was no acute lesions, she has constant neck pain, difficulty sitting still.  1  Neurontin 300 mg 3 times a day, hot compression for neck pain physical therapy 2. Short term disability paper work. 3. Return to clinic in 6 month with Samantha [name]

## 2013-05-21 ENCOUNTER — Telehealth: Payer: Self-pay | Admitting: *Deleted

## 2013-05-24 ENCOUNTER — Telehealth: Payer: Self-pay | Admitting: Neurology

## 2013-05-24 DIAGNOSIS — G4733 Obstructive sleep apnea (adult) (pediatric): Secondary | ICD-10-CM

## 2013-05-24 DIAGNOSIS — G4710 Hypersomnia, unspecified: Secondary | ICD-10-CM

## 2013-05-24 NOTE — Progress Notes (Signed)
Pls call pt to schedule split night and I will see her back in consultation.

## 2013-05-24 NOTE — Telephone Encounter (Signed)
Order placed for sleep study, thx s

## 2013-05-24 NOTE — Telephone Encounter (Signed)
. °  Dr. Levert Feinstein is referring Samantha [name], 47 y.o. y/o female, for a reevaluation of sleep apnea.  Wt: 135 lbs. Ht: 59 in. BMI: 29.21  Diagnoses: Obstructive Sleep Apnea Excessive Daytime Sleepiness Fatigue Headache Snoring  Medication List: Current Outpatient Prescriptions  Medication Sig Dispense Refill   aspirin (BAYER ASPIRIN) 325 MG tablet Take 1 tablet (325 mg total) by mouth daily.  100 tablet  3   atorvastatin (LIPITOR) 20 MG tablet Take 1 tablet (20 mg total) by mouth daily.  90 tablet  3   Calcium Carbonate-Vitamin D (CALCIUM-VITAMIN D) 500-200 MG-UNIT per tablet Take 1 tablet by mouth 2 (two) times daily with a meal.       cyclobenzaprine (FLEXERIL) 5 MG tablet Take 1 tablet (5 mg total) by mouth 3 (three) times daily as needed for muscle spasms.  60 tablet  1   escitalopram (LEXAPRO) 10 MG tablet TAKE 1 TABLET (10 MG TOTAL) BY MOUTH DAILY.  90 tablet  1   gabapentin (NEURONTIN) 300 MG capsule Take 1 capsule (300 mg total) by mouth 3 (three) times daily.  90 capsule  6   LORazepam (ATIVAN) 0.5 MG tablet TAKE 1 TABLET BY MOUTH TWICE A DAY  180 tablet  0   omega-3 acid ethyl esters (LOVAZA) 1 G capsule Take 2 capsules (2 g total) by mouth 2 (two) times daily.  360 capsule  1   SUMAtriptan (IMITREX) 100 MG tablet Take 1 tablet (100 mg total) by mouth daily as needed.  10 tablet  11   No current facility-administered medications for this visit.    This patient presents to Dr. Levert Feinstein with a complaint of excessive daytime sleepiness, she endorses Epworth at 15.  She reports daytime fatigue and headache.  The patient does snore.  She was diagnosed with obstructive sleep apnea in 2006.  Sleep study was done at [facility].  Pt at the time worked at a [employer] and received got a cpap machine but never had the settings adjusted.  Insurance:  [employer] - prior approval is not required - Coverage is 100%

## 2013-05-28 ENCOUNTER — Institutional Professional Consult (permissible substitution): Payer: BC Managed Care – PPO | Admitting: Pulmonary Disease

## 2013-05-29 ENCOUNTER — Ambulatory Visit: Payer: BC Managed Care – PPO | Admitting: Internal Medicine

## 2013-05-30 ENCOUNTER — Telehealth: Payer: Self-pay

## 2013-05-30 ENCOUNTER — Ambulatory Visit (INDEPENDENT_AMBULATORY_CARE_PROVIDER_SITE_OTHER): Payer: BC Managed Care – PPO | Admitting: Neurology

## 2013-05-30 DIAGNOSIS — G4733 Obstructive sleep apnea (adult) (pediatric): Secondary | ICD-10-CM

## 2013-05-30 DIAGNOSIS — G919 Hydrocephalus, unspecified: Secondary | ICD-10-CM

## 2013-05-30 DIAGNOSIS — G4720 Circadian rhythm sleep disorder, unspecified type: Secondary | ICD-10-CM

## 2013-05-30 DIAGNOSIS — R202 Paresthesia of skin: Secondary | ICD-10-CM

## 2013-05-30 DIAGNOSIS — G43009 Migraine without aura, not intractable, without status migrainosus: Secondary | ICD-10-CM

## 2013-05-30 DIAGNOSIS — G4710 Hypersomnia, unspecified: Secondary | ICD-10-CM

## 2013-05-30 NOTE — Telephone Encounter (Signed)
I called and spoke with patient. We reviewed forms and I will fax them out today. She called to make payment was was advised there is no payment required at this time. I will leave a copy of these forms along with fax confirmation at front desk for patient to pick up when she comes in later today.

## 2013-05-31 ENCOUNTER — Ambulatory Visit (INDEPENDENT_AMBULATORY_CARE_PROVIDER_SITE_OTHER): Payer: BC Managed Care – PPO | Admitting: Internal Medicine

## 2013-05-31 ENCOUNTER — Encounter: Payer: Self-pay | Admitting: Internal Medicine

## 2013-05-31 VITALS — BP 114/80 | HR 72 | Temp 99.2°F | Wt 141.2 lb

## 2013-05-31 DIAGNOSIS — M79609 Pain in unspecified limb: Secondary | ICD-10-CM

## 2013-05-31 DIAGNOSIS — G4733 Obstructive sleep apnea (adult) (pediatric): Secondary | ICD-10-CM

## 2013-05-31 DIAGNOSIS — R209 Unspecified disturbances of skin sensation: Secondary | ICD-10-CM

## 2013-05-31 DIAGNOSIS — G43009 Migraine without aura, not intractable, without status migrainosus: Secondary | ICD-10-CM

## 2013-05-31 DIAGNOSIS — F329 Major depressive disorder, single episode, unspecified: Secondary | ICD-10-CM

## 2013-05-31 DIAGNOSIS — R202 Paresthesia of skin: Secondary | ICD-10-CM

## 2013-05-31 DIAGNOSIS — G911 Obstructive hydrocephalus: Secondary | ICD-10-CM

## 2013-05-31 DIAGNOSIS — G919 Hydrocephalus, unspecified: Secondary | ICD-10-CM

## 2013-05-31 DIAGNOSIS — E785 Hyperlipidemia, unspecified: Secondary | ICD-10-CM

## 2013-05-31 MED ORDER — PREGABALIN 100 MG PO CAPS: 100.0000 mg | ORAL_CAPSULE | Freq: Two times a day (BID) | ORAL | Status: DC

## 2013-05-31 NOTE — Assessment & Plan Note (Signed)
Per Dr Yan - out of work till November Not better D/c Gabapentin Start Lyrica 

## 2013-05-31 NOTE — Assessment & Plan Note (Signed)
Continue with current prn prescription therapy as reflected on the Med list.  

## 2013-05-31 NOTE — Assessment & Plan Note (Signed)
S/p shunt

## 2013-05-31 NOTE — Assessment & Plan Note (Signed)
Per Dr Terrace Arabia - out of work till November Not better D/c Gabapentin Start Lyrica

## 2013-05-31 NOTE — Assessment & Plan Note (Signed)
Restart lipitor

## 2013-05-31 NOTE — Progress Notes (Signed)
Subjective:    HPI    Here for a f/u 2.5 mo ago acute onset of worsening lower neck pain, worse on the left, with pain to left upper back/shoulder and numbness to left arm extending distally, similar in area that seemed to start wks ago in bed when she stretched and reached for an object on the bedside table and felt a pop and pain lasting several days and seemed better until the return worse than ever now mod to severe;  No LUE weakness or loss of grip strength.  Also mentions similar left lower back pain with LLE numbness as well but much less severe pain, mild only, with LLE numbness but no weakness.  No HA and Pt denies new neurological symptoms such as new headache, or facial or extremity weakness as above.  No prior hx of CVA.  Denies worsening depressive symptoms, suicidal ideation, or panic; has ongoing anxiety.   Pt denies fever, wt loss, night sweats, loss of appetite, or other constitutional symptoms  Specifically asking for next 3 days off work, and work note for limitations after return to work next wk, as well as ST disability. H/o MS (?). The sx's are a little better  Dr Terrace Arabia: sx's suggestive of a right internal capsule thalamus small vessel disease, which can be missed by MRI scan, MRI of the brain showed stable chronic changes detailed above, there was no acute lesions  She had a sleep study last night     Past Medical History  Diagnosis Date  . Migraine   . Hyperlipidemia   . Cerebral ventricular shunt fitting or adjustment   . Anxiety   . Depression   . COMMON MIGRAINE 10/01/2007    Chronic    . GLUCOSE INTOLERANCE 10/01/2007    Qualifier: Diagnosis of  By: Jonny Ruiz MD, Len Blalock   . HYPERLIPIDEMIA 10/01/2007    Chronic. Lovaza is too $$$ Declined statins due to worries   . OBSTRUCTIVE SLEEP APNEA 10/01/2007    NPSG 2006:  AHI 9/hr Started cpap 2009 successfully.     . Paresthesia    Past Surgical History  Procedure Laterality Date  . Cholecystectomy  1981  .  Pituitary cyst w/subsequent shunt      reports that she has never smoked. She has never used smokeless tobacco. She reports that she drinks about 0.6 ounces of alcohol per week. She reports that she does not use illicit drugs. family history includes Alzheimer's disease in her mother; Cancer in her mother; Dementia in her mother; Heart disease in her father; Lung cancer in her others; Melanoma in her other; Mental illness in her mother; and Migraines in her mother. Allergies  Allergen Reactions  . Citalopram Hydrobromide     REACTION: low libido   Current Outpatient Prescriptions on File Prior to Visit  Medication Sig Dispense Refill  . aspirin (BAYER ASPIRIN) 325 MG tablet Take 1 tablet (325 mg total) by mouth daily.  100 tablet  3  . atorvastatin (LIPITOR) 20 MG tablet Take 1 tablet (20 mg total) by mouth daily.  90 tablet  3  . Calcium Carbonate-Vitamin D (CALCIUM-VITAMIN D) 500-200 MG-UNIT per tablet Take 1 tablet by mouth 2 (two) times daily with a meal.      . escitalopram (LEXAPRO) 10 MG tablet TAKE 1 TABLET (10 MG TOTAL) BY MOUTH DAILY.  90 tablet  1  . gabapentin (NEURONTIN) 300 MG capsule Take 1 capsule (300 mg total) by mouth 3 (three) times daily.  90  capsule  6  . LORazepam (ATIVAN) 0.5 MG tablet TAKE 1 TABLET BY MOUTH TWICE A DAY  180 tablet  0  . SUMAtriptan (IMITREX) 100 MG tablet Take 1 tablet (100 mg total) by mouth daily as needed.  10 tablet  11  . omega-3 acid ethyl esters (LOVAZA) 1 G capsule Take 2 capsules (2 g total) by mouth 2 (two) times daily.  360 capsule  1   No current facility-administered medications on file prior to visit.   Review of Systems  Constitutional: Negative for unexpected weight change, or unusual diaphoresis  HENT: Negative for tinnitus.   Eyes: Negative for photophobia and visual disturbance.  Respiratory: Negative for choking and stridor.   Gastrointestinal: Negative for vomiting and blood in stool.  Genitourinary: Negative for hematuria and  decreased urine volume.  Musculoskeletal: Negative for acute joint swelling Skin: Negative for color change and wound.  Neurological: Negative for tremors and numbness other than noted  Psychiatric/Behavioral: Negative for decreased concentration or  hyperactivity.       Objective:   Physical Exam BP 114/80  Pulse 72  Temp(Src) 99.2 F (37.3 C) (Oral)  Wt 141 lb 3.2 oz (64.048 kg)  BMI 30.55 kg/m2  SpO2 97% VS noted,  Constitutional: Pt appears well-developed and well-nourished.  HENT: Head: NCAT.  Right Ear: External ear normal.  Left Ear: External ear normal.  Eyes: Conjunctivae and EOM are normal. Pupils are equal, round, and reactive to light.  Neck: Normal range of motion. Neck supple.  Cardiovascular: Normal rate and regular rhythm.   Pulmonary/Chest: Effort normal and breath sounds normal.  Abd:  Soft, NT, non-distended, + BS Neurological: Pt is alert. Not confused , cn 2-12 intact, motor 5/5, with decr sens to LT to distal LUE, as well as LLE anterior thigh and lateral lower leg Spine tender to mid low c-spine only without red/tenderswelling Has some mild left lumbar paravertebral tender, no red/swelling Tenderness noted left trapezoid area as well, left shoulder NT, FROM Skin: Skin is warm. No erythema. No rash Psychiatric: Pt behavior is normal. Thought content normal. 1-2+ nervous, not depressed affect    Assessment & Plan:

## 2013-05-31 NOTE — Assessment & Plan Note (Signed)
She had a sleep study last night

## 2013-05-31 NOTE — Assessment & Plan Note (Signed)
Situational. °

## 2013-06-05 ENCOUNTER — Other Ambulatory Visit: Payer: Self-pay

## 2013-06-05 ENCOUNTER — Other Ambulatory Visit: Payer: Self-pay | Admitting: Internal Medicine

## 2013-06-05 MED ORDER — GABAPENTIN 300 MG PO CAPS: 300.0000 mg | ORAL_CAPSULE | Freq: Three times a day (TID) | ORAL | Status: DC

## 2013-06-06 ENCOUNTER — Telehealth: Payer: Self-pay | Admitting: *Deleted

## 2013-06-06 NOTE — Telephone Encounter (Signed)
Pt called again requests FMLA paperwork be faxed to [employer] at [phone] once received.

## 2013-06-14 ENCOUNTER — Telehealth: Payer: Self-pay | Admitting: Neurology

## 2013-06-14 ENCOUNTER — Telehealth: Payer: Self-pay | Admitting: *Deleted

## 2013-06-14 NOTE — Telephone Encounter (Signed)
Spoke with pt. Advised forms faxed to [employer] On 7.25.14 and mailed to her home.

## 2013-06-14 NOTE — Telephone Encounter (Signed)
[place]: Please call this patient and advise her that her sleep study was read by me and that it showed only very mild sleep apnea. She actually has a total AHI within normal limits at 3/hour, i.e. Less than 5/hour. Her sleep apnea is really only apparent when she is in dream sleep or REM sleep. While she did go down and her oxygen saturation to 80% at the lowest this was just a one-time occurrence while in rem sleep. Overall her oxygen remained in the mid 90s and she had some desaturations into the higher 80s otherwise. As far Street in with CPAP is concerned she may not need CPAP especially if she is able to achieve some weight loss and sleep on her sides. She can just followup with her primary care physician and with Dr. Terrace Arabia as planned. I'm copying Dr. Terrace Arabia on this as well.

## 2013-06-16 NOTE — Telephone Encounter (Signed)
Called patient to discuss sleep study results.  Discussed findings, recommendations and follow up care.  Patient understood well and all questions were answered.   Pt understands she will follow up with Dr. Terrace Arabia and that a copy of her results will be mailed to her home.  She was relieved to know she didn't need CPAP at this time.

## 2013-06-26 ENCOUNTER — Other Ambulatory Visit: Payer: Self-pay

## 2013-06-26 ENCOUNTER — Telehealth: Payer: Self-pay | Admitting: *Deleted

## 2013-06-26 NOTE — Telephone Encounter (Signed)
Patient called stating that she was given samples of Lyrica to take BID. Per pt, current dose is not helping and would like to increase to TID. If ok, pt will need a new rx sent to her pharmacy. Thanks

## 2013-06-26 NOTE — Telephone Encounter (Signed)
Forms dropped off from [employer] to be filled out. For Disability.  These have been placed in your green folder. Please advise Dr Shelle Iron, thanks.

## 2013-06-26 NOTE — Telephone Encounter (Signed)
Can't fill out. Haven't seen pt since 2012

## 2013-06-27 ENCOUNTER — Telehealth: Payer: Self-pay | Admitting: *Deleted

## 2013-06-27 MED ORDER — PREGABALIN 100 MG PO CAPS: 100.0000 mg | ORAL_CAPSULE | Freq: Three times a day (TID) | ORAL | Status: DC

## 2013-06-27 NOTE — Addendum Note (Signed)
Addended by: Tresa Garter on: 06/27/2013 12:44 PM   Modules accepted: Orders, Medications

## 2013-06-27 NOTE — Addendum Note (Signed)
Addended by: Rock Nephew T on: 06/27/2013 02:22 PM   Modules accepted: Orders

## 2013-06-27 NOTE — Telephone Encounter (Signed)
Pt called states the Lyrica 50mg  is not effective.  States she is taking it BID and she is in pain after about 4 hours,  She is requesting whether there is something else that can be done.  please advise

## 2013-06-27 NOTE — Telephone Encounter (Signed)
Ok Pls call in Thx 

## 2013-06-28 ENCOUNTER — Telehealth: Payer: Self-pay

## 2013-06-28 MED ORDER — PREGABALIN 75 MG PO CAPS: 75.0000 mg | ORAL_CAPSULE | Freq: Three times a day (TID) | ORAL | Status: DC

## 2013-06-28 NOTE — Telephone Encounter (Signed)
Changed to 75 mg tid Thx

## 2013-06-28 NOTE — Telephone Encounter (Signed)
Spoke with pt advised of MDs message 

## 2013-06-28 NOTE — Telephone Encounter (Signed)
Lyrica was approved for 75 mg TID but ordered for 100 mg TID. Rx corrected and sent to pharmacy.

## 2013-07-31 ENCOUNTER — Telehealth: Payer: Self-pay | Admitting: *Deleted

## 2013-07-31 MED ORDER — PREGABALIN 75 MG PO CAPS: 75.0000 mg | ORAL_CAPSULE | Freq: Three times a day (TID) | ORAL | Status: DC

## 2013-07-31 NOTE — Telephone Encounter (Signed)
OK to fill this prescription with additional refills x1 Thank you!  

## 2013-07-31 NOTE — Telephone Encounter (Signed)
Pt is requesting 90 day supply on Lyrica 100 mg tid. Please advise.

## 2013-08-01 NOTE — Telephone Encounter (Signed)
Rx faxed. Pt informed on 07/31/13.

## 2013-08-02 ENCOUNTER — Telehealth: Payer: Self-pay | Admitting: *Deleted

## 2013-08-02 NOTE — Telephone Encounter (Signed)
Pt called states Rx for Lyrica needs a PA sent to insurance co.  Please advise

## 2013-08-02 NOTE — Telephone Encounter (Signed)
Pt called back to provide phone number for our office to call to get this med approve [phone]. Pt also request sample for Lyrica 75 mg because she is completely out of this med. Please advise.

## 2013-08-02 NOTE — Telephone Encounter (Signed)
08/02/2013  Pt states that Lyrica is supposed to be 100 mg not 75 mg.  Pt picked up 75 mg for now.

## 2013-08-02 NOTE — Telephone Encounter (Signed)
Lyrica samples ready for pick up

## 2013-08-05 NOTE — Telephone Encounter (Signed)
Ok 100 mg. Pls change Thx

## 2013-08-05 NOTE — Telephone Encounter (Signed)
Please verify the correct milligram for the Lyrica

## 2013-08-06 ENCOUNTER — Other Ambulatory Visit: Payer: Self-pay | Admitting: *Deleted

## 2013-08-06 ENCOUNTER — Other Ambulatory Visit: Payer: Self-pay | Admitting: Internal Medicine

## 2013-08-06 MED ORDER — PREGABALIN 100 MG PO CAPS: 100.0000 mg | ORAL_CAPSULE | Freq: Three times a day (TID) | ORAL | Status: DC

## 2013-08-08 ENCOUNTER — Telehealth: Payer: Self-pay | Admitting: Internal Medicine

## 2013-08-08 ENCOUNTER — Ambulatory Visit (INDEPENDENT_AMBULATORY_CARE_PROVIDER_SITE_OTHER): Payer: BC Managed Care – PPO | Admitting: Internal Medicine

## 2013-08-08 ENCOUNTER — Encounter: Payer: Self-pay | Admitting: Internal Medicine

## 2013-08-08 VITALS — BP 140/80 | HR 80 | Temp 98.2°F | Resp 16 | Wt 150.0 lb

## 2013-08-08 DIAGNOSIS — G911 Obstructive hydrocephalus: Secondary | ICD-10-CM

## 2013-08-08 DIAGNOSIS — E785 Hyperlipidemia, unspecified: Secondary | ICD-10-CM

## 2013-08-08 DIAGNOSIS — M545 Low back pain, unspecified: Secondary | ICD-10-CM

## 2013-08-08 DIAGNOSIS — F329 Major depressive disorder, single episode, unspecified: Secondary | ICD-10-CM

## 2013-08-08 DIAGNOSIS — G919 Hydrocephalus, unspecified: Secondary | ICD-10-CM

## 2013-08-08 DIAGNOSIS — G8929 Other chronic pain: Secondary | ICD-10-CM | POA: Insufficient documentation

## 2013-08-08 DIAGNOSIS — R202 Paresthesia of skin: Secondary | ICD-10-CM

## 2013-08-08 DIAGNOSIS — R209 Unspecified disturbances of skin sensation: Secondary | ICD-10-CM

## 2013-08-08 DIAGNOSIS — F32A Depression, unspecified: Secondary | ICD-10-CM

## 2013-08-08 DIAGNOSIS — Z23 Encounter for immunization: Secondary | ICD-10-CM

## 2013-08-08 DIAGNOSIS — M542 Cervicalgia: Secondary | ICD-10-CM

## 2013-08-08 MED ORDER — PREGABALIN 75 MG PO CAPS: 75.0000 mg | ORAL_CAPSULE | Freq: Three times a day (TID) | ORAL | Status: DC | PRN

## 2013-08-08 MED ORDER — DULOXETINE HCL 20 MG PO CPEP: 20.0000 mg | ORAL_CAPSULE | Freq: Every day | ORAL | Status: DC

## 2013-08-08 NOTE — Assessment & Plan Note (Addendum)
Start Cymbalta 

## 2013-08-08 NOTE — Assessment & Plan Note (Signed)
Start PT 

## 2013-08-08 NOTE — Progress Notes (Signed)
Subjective:    HPI    No change in  Previous sx's. C/o being depressed C/o neck pain  Here for a f/u 4 mo ago acute onset of worsening lower neck pain, worse on the left, with pain to left upper back/shoulder and numbness to left arm extending distally, similar in area that seemed to start wks ago in bed when she stretched and reached for an object on the bedside table and felt a pop and pain lasting several days and seemed better until the return worse than ever now mod to severe;  No LUE weakness or loss of grip strength.  Also mentions similar left lower back pain with LLE numbness as well but much less severe pain, mild only, with LLE numbness but no weakness.  No HA and Pt denies new neurological symptoms such as new headache, or facial or extremity weakness as above.  No prior hx of CVA.  Denies worsening depressive symptoms, suicidal ideation, or panic; has ongoing anxiety.   Pt denies fever, wt loss, night sweats, loss of appetite, or other constitutional symptoms  Specifically asking for next 3 days off work, and work note for limitations after return to work next wk, as well as ST disability. H/o MS (?). The sx's are a little better  Dr Terrace Arabia: sx's suggestive of a right internal capsule thalamus small vessel disease, which can be missed by MRI scan, MRI of the brain showed stable chronic changes detailed above, there was no acute lesions  She had a sleep study last night     Past Medical History  Diagnosis Date  . Migraine   . Hyperlipidemia   . Cerebral ventricular shunt fitting or adjustment   . Anxiety   . Depression   . COMMON MIGRAINE 10/01/2007    Chronic    . GLUCOSE INTOLERANCE 10/01/2007    Qualifier: Diagnosis of  By: Jonny Ruiz MD, Len Blalock   . HYPERLIPIDEMIA 10/01/2007    Chronic. Lovaza is too $$$ Declined statins due to worries   . OBSTRUCTIVE SLEEP APNEA 10/01/2007    NPSG 2006:  AHI 9/hr Started cpap 2009 successfully.     . Paresthesia    Past Surgical History   Procedure Laterality Date  . Cholecystectomy  1981  . Pituitary cyst w/subsequent shunt      reports that she has never smoked. She has never used smokeless tobacco. She reports that she drinks about 0.6 ounces of alcohol per week. She reports that she does not use illicit drugs. family history includes Alzheimer's disease in her mother; Cancer in her mother; Dementia in her mother; Heart disease in her father; Lung cancer in her other, other, and other; Melanoma in her other; Mental illness in her mother; Migraines in her mother. Allergies  Allergen Reactions  . Citalopram Hydrobromide     REACTION: low libido   Current Outpatient Prescriptions on File Prior to Visit  Medication Sig Dispense Refill  . aspirin (BAYER ASPIRIN) 325 MG tablet Take 1 tablet (325 mg total) by mouth daily.  100 tablet  3  . atorvastatin (LIPITOR) 20 MG tablet Take 1 tablet (20 mg total) by mouth daily.  90 tablet  3  . Calcium Carbonate-Vitamin D (CALCIUM-VITAMIN D) 500-200 MG-UNIT per tablet Take 1 tablet by mouth 2 (two) times daily with a meal.      . escitalopram (LEXAPRO) 10 MG tablet TAKE 1 TABLET (10 MG TOTAL) BY MOUTH DAILY.  90 tablet  1  . LORazepam (ATIVAN) 0.5 MG  tablet TAKE 1 TABLET BY MOUTH TWICE A DAY  180 tablet  0  . pregabalin (LYRICA) 100 MG capsule Take 1 capsule (100 mg total) by mouth 3 (three) times daily.  270 capsule  1  . SUMAtriptan (IMITREX) 100 MG tablet Take 1 tablet (100 mg total) by mouth daily as needed.  10 tablet  11  . escitalopram (LEXAPRO) 10 MG tablet TAKE 1 TABLET (10 MG TOTAL) BY MOUTH DAILY.  90 tablet  1  . omega-3 acid ethyl esters (LOVAZA) 1 G capsule Take 2 capsules (2 g total) by mouth 2 (two) times daily.  360 capsule  1   No current facility-administered medications on file prior to visit.   Review of Systems  Constitutional: Negative for unexpected weight change, or unusual diaphoresis  HENT: Negative for tinnitus.   Eyes: Negative for photophobia and visual  disturbance.  Respiratory: Negative for choking and stridor.   Gastrointestinal: Negative for vomiting and blood in stool.  Genitourinary: Negative for hematuria and decreased urine volume.  Musculoskeletal: Negative for acute joint swelling Skin: Negative for color change and wound.  Neurological: Negative for tremors and numbness other than noted  Psychiatric/Behavioral: Negative for decreased concentration or  hyperactivity.       Objective:   Physical Exam BP 140/80  Pulse 80  Temp(Src) 98.2 F (36.8 C) (Oral)  Resp 16  Wt 150 lb (68.04 kg)  BMI 32.45 kg/m2 VS noted,  Constitutional: Pt appears well-developed and well-nourished.  HENT: Head: NCAT.  Right Ear: External ear normal.  Left Ear: External ear normal.  Eyes: Conjunctivae and EOM are normal. Pupils are equal, round, and reactive to light.  Neck: Normal range of motion. Neck supple.  Cardiovascular: Normal rate and regular rhythm.   Pulmonary/Chest: Effort normal and breath sounds normal.  Abd:  Soft, NT, non-distended, + BS Neurological: Pt is alert. Not confused , cn 2-12 intact, motor 5/5, with decr sens to LT to distal LUE, as well as LLE anterior thigh and lateral lower leg Spine tender to mid low c-spine only without red/tenderswelling Has some mild left lumbar paravertebral tender, no red/swelling Tenderness noted left trapezoid area as well, left shoulder NT, FROM Skin: Skin is warm. No erythema. No rash Psychiatric: Pt behavior is normal. Thought content normal. not nervous,no obvious depressed affect    Assessment & Plan:

## 2013-08-08 NOTE — Assessment & Plan Note (Signed)
Continue with current prescription therapy as reflected on the Med list.  

## 2013-08-08 NOTE — Assessment & Plan Note (Signed)
Chronic. 

## 2013-08-08 NOTE — Assessment & Plan Note (Signed)
Start Cymbalta 

## 2013-08-08 NOTE — Telephone Encounter (Signed)
Pt request phone call concern about PA. Pt stated that she forgot to ask before she left for her appt. Please call pt.

## 2013-08-08 NOTE — Assessment & Plan Note (Addendum)
Dr Terrace Arabia: sx's suggestive of a right internal capsule thalamus small vessel disease, which can be missed by MRI scan, MRI of the brain showed stable chronic changes detailed above, there was no acute lesions  Continue with current prescription therapy as reflected on the Med list.

## 2013-08-21 NOTE — Telephone Encounter (Signed)
I called pt's pharmacy. They state no PA is needed.  The patient's ins covers Lyrica 75 mg. She just has a high deductible of $364.29. Pt informed of this. She requests samples. Lorri, Will you check to see if we have any Lyrica samples 75 mg or 100 mg and if so, call her and put them upfront for her? Thanks!

## 2013-08-26 ENCOUNTER — Other Ambulatory Visit: Payer: Self-pay | Admitting: Internal Medicine

## 2013-09-09 ENCOUNTER — Telehealth: Payer: Self-pay | Admitting: *Deleted

## 2013-09-09 ENCOUNTER — Telehealth: Payer: Self-pay | Admitting: Internal Medicine

## 2013-09-09 MED ORDER — PREGABALIN 75 MG PO CAPS: 75.0000 mg | ORAL_CAPSULE | Freq: Three times a day (TID) | ORAL | Status: DC | PRN

## 2013-09-09 MED ORDER — LORAZEPAM 0.5 MG PO TABS: 0.5000 mg | ORAL_TABLET | Freq: Three times a day (TID) | ORAL | Status: DC | PRN

## 2013-09-09 NOTE — Telephone Encounter (Signed)
Please fax in the Rx 

## 2013-09-09 NOTE — Telephone Encounter (Signed)
Pt is requesting a refill for Lorazepam 0.5mg  Last OV 9.25.14 Last filled 6.20.14

## 2013-09-09 NOTE — Telephone Encounter (Signed)
Faxed rx

## 2013-09-09 NOTE — Telephone Encounter (Signed)
Samples are up front for p/u. Pt informed

## 2013-09-09 NOTE — Telephone Encounter (Signed)
09/09/2013   Pt called in regards to getting Lyrica samples.  Had spoke with someone and they said she could possibly get the samples, just checking to see if she could.  Please contact pt back at [phone].

## 2013-10-11 ENCOUNTER — Ambulatory Visit: Payer: BC Managed Care – PPO | Admitting: Internal Medicine

## 2013-10-14 ENCOUNTER — Encounter: Payer: Self-pay | Admitting: Internal Medicine

## 2013-10-14 ENCOUNTER — Other Ambulatory Visit: Payer: Self-pay | Admitting: *Deleted

## 2013-10-14 ENCOUNTER — Telehealth: Payer: Self-pay | Admitting: Internal Medicine

## 2013-10-14 ENCOUNTER — Ambulatory Visit (INDEPENDENT_AMBULATORY_CARE_PROVIDER_SITE_OTHER): Payer: BC Managed Care – PPO | Admitting: Internal Medicine

## 2013-10-14 VITALS — BP 150/90 | HR 80 | Temp 97.9°F | Resp 16 | Wt 147.0 lb

## 2013-10-14 DIAGNOSIS — K219 Gastro-esophageal reflux disease without esophagitis: Secondary | ICD-10-CM

## 2013-10-14 MED ORDER — OMEPRAZOLE 40 MG PO CPDR: 40.0000 mg | DELAYED_RELEASE_CAPSULE | Freq: Every day | ORAL | Status: DC

## 2013-10-14 MED ORDER — DULOXETINE HCL 20 MG PO CPEP: 20.0000 mg | ORAL_CAPSULE | Freq: Every day | ORAL | Status: DC

## 2013-10-14 MED ORDER — PREGABALIN 75 MG PO CAPS: 75.0000 mg | ORAL_CAPSULE | Freq: Three times a day (TID) | ORAL | Status: DC | PRN

## 2013-10-14 MED ORDER — OMEGA-3-ACID ETHYL ESTERS 1 G PO CAPS: 2.0000 g | ORAL_CAPSULE | Freq: Two times a day (BID) | ORAL | Status: DC

## 2013-10-14 NOTE — Telephone Encounter (Signed)
Patient is requesting samples lyrica samples.  Patient will come back probably later on today.

## 2013-10-14 NOTE — Assessment & Plan Note (Signed)
12/14 worse -?due to wt gain Start Omeprazole

## 2013-10-14 NOTE — Progress Notes (Signed)
Pre visit review using our clinic review tool, if applicable. No additional management support is needed unless otherwise documented below in the visit note. 

## 2013-10-14 NOTE — Progress Notes (Signed)
Subjective:    HPI    No change in  Previous sx's. C/o GERD x wks. C/o being depressed, tired C/o neck pain - she wasunable to get Lyrica, Cymbalta - due to PA  Here for a f/u 4 mo ago acute onset of worsening lower neck pain, worse on the left, with pain to left upper back/shoulder and numbness to left arm extending distally, similar in area that seemed to start wks ago in bed when she stretched and reached for an object on the bedside table and felt a pop and pain lasting several days and seemed better until the return worse than ever now mod to severe;  No LUE weakness or loss of grip strength.  Also mentions similar left lower back pain with LLE numbness as well but much less severe pain, mild only, with LLE numbness but no weakness.  No HA and Pt denies new neurological symptoms such as new headache, or facial or extremity weakness as above.  No prior hx of CVA.  Denies worsening depressive symptoms, suicidal ideation, or panic; has ongoing anxiety.   Pt denies fever, wt loss, night sweats, loss of appetite, or other constitutional symptoms  Specifically asking for next 3 days off work, and work note for limitations after return to work next wk, as well as ST disability. H/o MS (?). The sx's are a little better  Dr Terrace Arabia: sx's suggestive of a right internal capsule thalamus small vessel disease, which can be missed by MRI scan, MRI of the brain showed stable chronic changes detailed above, there was no acute lesions  She had a sleep study last night   Wt Readings from Last 3 Encounters:  10/14/13 147 lb (66.679 kg)  08/08/13 150 lb (68.04 kg)  05/31/13 141 lb 3.2 oz (64.048 kg)      Past Medical History  Diagnosis Date  . Migraine   . Hyperlipidemia   . Cerebral ventricular shunt fitting or adjustment   . Anxiety   . Depression   . COMMON MIGRAINE 10/01/2007    Chronic    . GLUCOSE INTOLERANCE 10/01/2007    Qualifier: Diagnosis of  By: Jonny Ruiz MD, Len Blalock   . HYPERLIPIDEMIA  10/01/2007    Chronic. Lovaza is too $$$ Declined statins due to worries   . OBSTRUCTIVE SLEEP APNEA 10/01/2007    NPSG 2006:  AHI 9/hr Started cpap 2009 successfully.     . Paresthesia    Past Surgical History  Procedure Laterality Date  . Cholecystectomy  1981  . Pituitary cyst w/subsequent shunt      reports that she has never smoked. She has never used smokeless tobacco. She reports that she drinks about 0.6 ounces of alcohol per week. She reports that she does not use illicit drugs. family history includes Alzheimer's disease in her mother; Cancer in her mother; Dementia in her mother; Heart disease in her father; Lung cancer in her other, other, and other; Melanoma in her other; Mental illness in her mother; Migraines in her mother. Allergies  Allergen Reactions  . Citalopram Hydrobromide     REACTION: low libido   Current Outpatient Prescriptions on File Prior to Visit  Medication Sig Dispense Refill  . aspirin (BAYER ASPIRIN) 325 MG tablet Take 1 tablet (325 mg total) by mouth daily.  100 tablet  3  . atorvastatin (LIPITOR) 20 MG tablet Take 1 tablet (20 mg total) by mouth daily.  90 tablet  3  . Calcium Carbonate-Vitamin D (CALCIUM-VITAMIN D) 500-200 MG-UNIT  per tablet Take 1 tablet by mouth 2 (two) times daily with a meal.      . DULoxetine (CYMBALTA) 20 MG capsule Take 1 capsule (20 mg total) by mouth daily.  90 capsule  3  . escitalopram (LEXAPRO) 10 MG tablet TAKE 1 TABLET (10 MG TOTAL) BY MOUTH DAILY.  90 tablet  1  . LORazepam (ATIVAN) 0.5 MG tablet Take 1 tablet (0.5 mg total) by mouth every 8 (eight) hours as needed for anxiety.  30 tablet  0  . pregabalin (LYRICA) 75 MG capsule Take 1 capsule (75 mg total) by mouth 3 (three) times daily as needed.  28 capsule  0  . SUMAtriptan (IMITREX) 100 MG tablet Take 1 tablet (100 mg total) by mouth daily as needed.  10 tablet  11  . omega-3 acid ethyl esters (LOVAZA) 1 G capsule Take 2 capsules (2 g total) by mouth 2 (two) times  daily.  360 capsule  1   No current facility-administered medications on file prior to visit.   Review of Systems  Constitutional: Negative for unexpected weight change, or unusual diaphoresis  HENT: Negative for tinnitus.   Eyes: Negative for photophobia and visual disturbance.  Respiratory: Negative for choking and stridor.   Gastrointestinal: Negative for vomiting and blood in stool.  Genitourinary: Negative for hematuria and decreased urine volume.  Musculoskeletal: Negative for acute joint swelling Skin: Negative for color change and wound.  Neurological: Negative for tremors and numbness other than noted  Psychiatric/Behavioral: Negative for decreased concentration or  hyperactivity.       Objective:   Physical Exam BP 150/90  Pulse 80  Temp(Src) 97.9 F (36.6 C) (Oral)  Resp 16  Wt 147 lb (66.679 kg) VS noted,  Constitutional: Pt appears well-developed and well-nourished.  HENT: Head: NCAT.  Right Ear: External ear normal.  Left Ear: External ear normal.  Eyes: Conjunctivae and EOM are normal. Pupils are equal, round, and reactive to light.  Neck: Normal range of motion. Neck supple.  Cardiovascular: Normal rate and regular rhythm.   Pulmonary/Chest: Effort normal and breath sounds normal.  Abd:  Soft, NT, non-distended, + BS Neurological: Pt is alert. Not confused , cn 2-12 intact, motor 5/5, with decr sens to LT to distal LUE, as well as LLE anterior thigh and lateral lower leg Spine tender to mid low c-spine only without red/tenderswelling Has some mild left lumbar paravertebral tender, no red/swelling Tenderness noted left trapezoid area as well, left shoulder NT, FROM Skin: Skin is warm. No erythema. No rash Psychiatric: Pt behavior is normal. Thought content normal. not nervous,no obvious depressed affect      Assessment & Plan:

## 2013-10-18 MED ORDER — PREGABALIN 75 MG PO CAPS: 75.0000 mg | ORAL_CAPSULE | Freq: Three times a day (TID) | ORAL | Status: DC | PRN

## 2013-10-18 NOTE — Telephone Encounter (Signed)
Lyrica 75 mg samples are upfront for p/u. Pt informed

## 2013-10-23 ENCOUNTER — Other Ambulatory Visit: Payer: Self-pay | Admitting: Internal Medicine

## 2013-11-15 ENCOUNTER — Other Ambulatory Visit: Payer: Self-pay | Admitting: *Deleted

## 2013-11-15 ENCOUNTER — Telehealth: Payer: Self-pay | Admitting: Internal Medicine

## 2013-11-15 MED ORDER — PREGABALIN 75 MG PO CAPS: 75.0000 mg | ORAL_CAPSULE | Freq: Three times a day (TID) | ORAL | Status: DC | PRN

## 2013-11-15 NOTE — Telephone Encounter (Signed)
Pt request sample for Lyrica 75 mg. Please call pt

## 2013-11-20 ENCOUNTER — Encounter: Payer: Self-pay | Admitting: Nurse Practitioner

## 2013-11-20 ENCOUNTER — Ambulatory Visit (INDEPENDENT_AMBULATORY_CARE_PROVIDER_SITE_OTHER): Payer: BC Managed Care – PPO | Admitting: Nurse Practitioner

## 2013-11-20 VITALS — BP 148/97 | HR 84 | Ht <= 58 in | Wt 147.0 lb

## 2013-11-20 DIAGNOSIS — G919 Hydrocephalus, unspecified: Secondary | ICD-10-CM

## 2013-11-20 DIAGNOSIS — G911 Obstructive hydrocephalus: Secondary | ICD-10-CM

## 2013-11-20 DIAGNOSIS — G43009 Migraine without aura, not intractable, without status migrainosus: Secondary | ICD-10-CM

## 2013-11-20 NOTE — Progress Notes (Signed)
[facility]  PATIENT: Samantha [name] DOB: 09-10-1966   REASON FOR VISIT: Followup for headaches, neck pain   HISTORY OF PRESENT ILLNESS: Samantha [name], 48 year old female returns for followup. She was last in the office by Samantha [name] 05/20/2013. She was complaining of neck pain radiating into the occipital region, she has taken Flexeril Tramadol with only minimal help. She was placed on gabapentin by Samantha [name] but that medication was switched by her primary care Samantha [name] to Lyrica and now Cymbalta has been added. 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 She has not had any physical therapy nor exercises for her neck. Reviewed MRI of the brain and cervical spine with patient. She also complains of chronic constipation  HISTORY: right-handed Caucasian female, referred by her primary care physician Samantha [name] for evaluation of acute onset left-sided numbness, last visit was June 30th 2014.  She had a history of pituitary cyst, was diagnosed at age 28, she presented with right visual loss, difficulty walking, short statue, she had a right frontal craniotomy at age 66, 61 weeks later, she developed worsening headaches, memory loss, was found to have obstructive hydrocephalus, she had right parietal VP shunt placement.  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,she denies significant difficulty, she does have frequent headaches, about once a month severe right-sided headaches, relieved by Imitrex,  Mar 27 2013, she got up in the morning, trying to get off the bed, she suddenly felt something pulling in her neck, in the same time, she noticed numbness in her left arm, neck, but sparing left face, she also has some subjective weakness, the dense numbness lasting for 24 hours, much improved now, but she still not back to her normal self she still has intermittent numbness tingling involving the left arm, left leg  She was evaluated by PCP the same day,  complained of neck pain, MRI of the brain showed moderate enlargement of the third and lateral ventricles, unchanged. There is a right parietal shunt catheter extending into the right lateral ventricle with the tip in the temporal horn. This is unchanged. The fourth ventricle is not dilated. Right frontal craniotomy. Encephalomalacia right frontal lobe related to prior surgery. There is enlargement of the right  frontal horn related to volume loss in the right frontal lobe  MRI cervical showed focal medial foraminal disc protrusion at C5-6. Lateral foraminal disc osteophyte complex on the right at C7- T1.  She was given prescription of Flexeril, tramadol, without significant improvement she had a history of obstructive sleep sleep apnea, she used to use CPAP machine, but has not had her settings checked for many years, she complains of nighttime snoring, excessive daytime fatigue, sleepiness,  Laboratory Evaluation showed normal B12, TSH, ANA, CMP, CBC, elevated ESR 47,  UPDATE July 7th 2014;  She complains of neck pain, radiating pain to occipital region, which is constant, she has taken Flexeril, tramadol, which only help her mildly,   She could not sit up or stand up for prolonged period of time. She denies gait difficulty, she has trouble with her left eye, transient vision blackout, when she rub it and the vision comes back, it lasts 4-5 second, she has no headaches, wake up in the middle of the night, pain at her feet and legs, improved after stopping Lipitor.   REVIEW OF SYSTEMS: Full 14 system review of systems performed and notable only for those listed, all others are neg:  Constitutional: Fatigue Cardiovascular: N/A  Ear/Nose/Throat: Neck pain Skin: N/A  Eyes: N/A  Respiratory: N/A  Gastroitestinal: Constipation Hematology/Lymphatic: N/A  Endocrine: N/A Musculoskeletal: Coordination problems  Allergy/Immunology: N/A  Neurological: Headache Psychiatric: Depression  anxiety  ALLERGIES: Allergies  Allergen Reactions  . Citalopram Hydrobromide     REACTION: low libido    HOME MEDICATIONS: Outpatient Prescriptions Prior to Visit  Medication Sig Dispense Refill  . aspirin (BAYER ASPIRIN) 325 MG tablet Take 1 tablet (325 mg total) by mouth daily.  100 tablet  3  . atorvastatin (LIPITOR) 20 MG tablet TAKE 1 TABLET (20 MG TOTAL) BY MOUTH DAILY.  90 tablet  3  . Calcium Carbonate-Vitamin D (CALCIUM-VITAMIN D) 500-200 MG-UNIT per tablet Take 1 tablet by mouth 2 (two) times daily with a meal.      . DULoxetine (CYMBALTA) 20 MG capsule Take 1 capsule (20 mg total) by mouth daily.  90 capsule  3  . escitalopram (LEXAPRO) 10 MG tablet TAKE 1 TABLET (10 MG TOTAL) BY MOUTH DAILY.  90 tablet  1  . LORazepam (ATIVAN) 0.5 MG tablet TAKE 1 TABLET BY MOUTH TWICE A DAY  180 tablet  0  . omega-3 acid ethyl esters (LOVAZA) 1 G capsule Take 2 capsules (2 g total) by mouth 2 (two) times daily.  360 capsule  1  . omeprazole (PRILOSEC) 40 MG capsule Take 1 capsule (40 mg total) by mouth daily.  30 capsule  5  . pregabalin (LYRICA) 75 MG capsule Take 1 capsule (75 mg total) by mouth 3 (three) times daily as needed.  90 capsule  2  . SUMAtriptan (IMITREX) 100 MG tablet Take 1 tablet (100 mg total) by mouth daily as needed.  10 tablet  11  . atorvastatin (LIPITOR) 20 MG tablet Take 1 tablet (20 mg total) by mouth daily.  90 tablet  3   No facility-administered medications prior to visit.    PAST MEDICAL HISTORY: Past Medical History  Diagnosis Date  . Migraine   . Hyperlipidemia   . Cerebral ventricular shunt fitting or adjustment   . Anxiety   . Depression   . COMMON MIGRAINE 10/01/2007    Chronic    . GLUCOSE INTOLERANCE 10/01/2007    Qualifier: Diagnosis of  By: Samantha Reichmann MD, Hunt Oris   . HYPERLIPIDEMIA 10/01/2007    Chronic. Lovaza is too $$$ Declined statins due to worries   . OBSTRUCTIVE SLEEP APNEA 10/01/2007    NPSG 2006:  AHI 9/hr Started cpap 2009 successfully.      . Paresthesia     PAST SURGICAL HISTORY: Past Surgical History  Procedure Laterality Date  . Cholecystectomy  1981  . Pituitary cyst w/subsequent shunt      FAMILY HISTORY: Family History  Problem Relation Age of Onset  . Dementia Mother   . Mental illness Mother     Alzheimer's  . Cancer Mother     kidney ca  . Heart disease Father   . Melanoma Other   . Lung cancer Other   . Lung cancer Other   . Lung cancer Other   . Alzheimer's disease Mother   . Migraines Mother     SOCIAL HISTORY: History   Social History  . Marital Status: Married    Spouse Name: Elta Guadeloupe    Number of Children: 0  . Years of Education: 12   Occupational History  . [employer]  .     Social History Main Topics  .  Smoking status: Never Smoker   . Smokeless tobacco: Never Used  . Alcohol Use: 0.6 oz/week    1 Glasses of wine per week     Comment: Social  . Drug Use: No  . Sexual Activity: Yes   Other Topics Concern  . Not on file   Social History Narrative   Mother is in a NH.    Patient lives at home with her husband Elta Guadeloupe). 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    High school education.   Right handed.   Caffeine 1 cup daily.   She has no children     PHYSICAL EXAM  Filed Vitals:   11/20/13 1042  BP: 148/97  Pulse: 84  Height: 4' 9.5" (1.461 m)  Weight: 147 lb (66.679 kg)   Body mass index is 31.24 kg/(m^2).  Generalized: Well developed, mildly obese female in no acute distress  Head: normocephalic and atraumatic,. Oropharynx benign  Neck: Supple, no carotid bruits, tenderness to palpation paraspinals  Cardiac: Regular rate rhythm, no murmur  Musculoskeletal: No deformity   Neurological examination   Mentation: Alert oriented to time, place, history taking. Follows all commands speech and language fluent  Cranial nerve II-XII: Pupils were equal round reactive to light extraocular movements were full, visual field were full on  confrontational test. Facial sensation and strength were normal. hearing was intact to finger rubbing bilaterally. Uvula tongue midline. head turning and shoulder shrug were normal and symmetric.Tongue protrusion into cheek strength was normal. Motor: normal bulk and tone, full strength in the BUE, BLE, fine finger movements normal, no pronator drift. No focal weakness Sensory: normal and symmetric to light touch, pinprick, and  vibration  Coordination: finger-nose-finger, heel-to-shin bilaterally, no dysmetria Reflexes: Brachioradialis 2/2, biceps 2/2, triceps 2/2, patellar 3/3, Achilles 2/2, plantar responses were flexor on left, extensor on right. Gait and Station: Rising up from seated position without assistance, normal stance,  moderate stride, good arm swing, smooth turning, able to perform tiptoe, and heel walking without difficulty. Tandem gait is steady  DIAGNOSTIC DATA (LABS, IMAGING, TESTING) - I reviewed patient records, labs, notes, testing and imaging myself where available.  Lab Results  Component Value Date   WBC 8.4 04/01/2013   HGB 13.2 04/01/2013   HCT 38.6 04/01/2013   MCV 87.6 04/01/2013   PLT 401.0* 04/01/2013      Component Value Date/Time   NA 138 04/01/2013 1100   K 4.0 04/01/2013 1100   CL 102 04/01/2013 1100   CO2 28 04/01/2013 1100   GLUCOSE 94 04/01/2013 1100   GLUCOSE 108* 11/22/2006 0728   BUN 12 04/01/2013 1100   CREATININE 0.9 04/01/2013 1100   CALCIUM 9.7 04/01/2013 1100   PROT 7.7 04/01/2013 1100   ALBUMIN 3.9 04/01/2013 1100   AST 22 04/01/2013 1100   ALT 20 04/01/2013 1100   ALKPHOS 43 04/01/2013 1100   BILITOT 0.6 04/01/2013 1100   GFRNONAA 76.46 06/29/2010 1025   Lab Results  Component Value Date   CHOL 281* 05/02/2011   HDL 57.20 05/02/2011   LDLDIRECT 207.7 05/02/2011   TRIG 125.0 05/02/2011   CHOLHDL 5 05/02/2011   No results found for this basename: HGBA1C   Lab Results  Component Value Date   VITAMINB12 334 04/01/2013   Lab Results  Component Value  Date   TSH 1.85 04/01/2013      ASSESSMENT AND PLAN  48 y.o. year old female  has a past medical history of Migraine; and history of pituitary  cyst status post craniotomy, normocephalic right VP shunt placement. History of left-sided numbness most recent MRI of the brain shows stable chronic changes without acute lesions and she continues with neck pain. She is currently on Lyrica and Cymbalta by primary care physician. Recent sleep study showed only very mild sleep apnea.   Pt is currently on Lyrica and Cymbalta per Samantha [name].  Given a copy of neck exercises to  perform several times daily For Constipation 1. -1 cup of bran, 1 cup of applesauce in 1 cup of prune juice, mix together, makes a paste 2.  Increase fiber intake (Metamucil,vegetables) 3.  Regular, moderate exercise can be beneficial. 4.  Avoid medications causing constipation, such as medications like antacids with calcium or magnesium 5.  Laxative overuse should be avoided. 6.  Stool softeners (Colace) can help with chronic constipation. F/U in 6 months Dennie Bible, [facility], [facility], APRN  [facility] [address], [place], [place] [address] [phone]

## 2013-11-20 NOTE — Patient Instructions (Signed)
Pt is currently on Lyrica and Cymbalta per Dr. Posey ReaPlotnikov.  For Constipation 1. -1 cup of bran, 1 cup of applesauce in 1 cup of prune juice, mix together, makes a paste 2.  Increase fiber intake (Metamucil,vegetables) 3.  Regular, moderate exercise can be beneficial. 4.  Avoid medications causing constipation, such as medications like antacids with calcium or magnesium 5.  Laxative overuse should be avoided. 6.  Stool softeners (Colace) can help with chronic constipation. F/U in 6 months Given cervical exercises

## 2014-01-10 NOTE — Telephone Encounter (Signed)
Message answered °

## 2014-01-16 ENCOUNTER — Ambulatory Visit: Payer: Self-pay | Admitting: Internal Medicine

## 2014-04-03 ENCOUNTER — Telehealth: Payer: Self-pay | Admitting: Nurse Practitioner

## 2014-04-03 ENCOUNTER — Telehealth: Payer: Self-pay | Admitting: Internal Medicine

## 2014-04-03 DIAGNOSIS — M79609 Pain in unspecified limb: Secondary | ICD-10-CM

## 2014-04-03 DIAGNOSIS — G43009 Migraine without aura, not intractable, without status migrainosus: Secondary | ICD-10-CM

## 2014-04-03 DIAGNOSIS — M542 Cervicalgia: Secondary | ICD-10-CM

## 2014-04-03 NOTE — Telephone Encounter (Signed)
Patient calling and stated her disability benefits are going to be discontinued per [employer].  They have OV notes that state her exams are normal and she still in excruciating pain.  She stated the last time she saw Dr Terrace ArabiaYan, she stated she would not be able to return to work.  Please call and advise.

## 2014-04-03 NOTE — Telephone Encounter (Signed)
Spoke with patient and relayed that the doctor has ordered a NCV/EMG, and once completed will see her in a follow up appointment to discuss results, and her recommendations.  Nothing can be filled out prior to these appointments.

## 2014-04-03 NOTE — Telephone Encounter (Signed)
I have reviewed her chart, MRI films, I have seen her for frequent neck pain, last visit with me was July 2014, most recent visit with our office was by Eber Jonesarolyn in January seven-[address], she continued to complain self neck pain, radiating to right occipital region, previously has field 1 short-term disability paperwork,  MRI of the brain No change from the MRI 12/04/2009. Moderate ventricular  enlargement is stable. This could be due to obstructive  hydrocephalous. Right ventricular shunt catheter unchanged.  Nodular soft tissue abnormality in the third ventricle is stable.  Adjacent to this is an area of susceptibility which could be a  metal clip based on cervical spine x-rays of 02/20/2003.   MRI of cervical spine: About this is Focal medial foraminal disc protrusion at C5-6.  2. Lateral foraminal disc osteophyte complex on the right at C7- T1.  I have ordered EMG nerve conduction study, to further evaluate her complaints of right-sided neck pain, rule out right radiculopathy, left hip there was no significant pathology found, however for her to pain management, further paperwork about her disability application should come from her primary care physician all future physicians.

## 2014-04-03 NOTE — Telephone Encounter (Signed)
PT ARRIVED TO REQUEST LETTER FROM DR PLOTNIKOV ABOUT DISABILITY STATUS. PT STATES INSURANCE COMPANY IS ATTEMPTING TO DENY CLAIM FOR DISABILITY AND REQUEST PT TO RETURN TO WORK BASED ON NOTES FROM DR PLOTNIKOV DURING PT LAST VISIT. PT STATES HER STATUS IS OTHERWISE. PLEASE CONTACT PT OR SPOUSE WHEN LETTER IS COMPLETED.

## 2014-04-04 ENCOUNTER — Telehealth: Payer: Self-pay | Admitting: Neurology

## 2014-04-04 NOTE — Telephone Encounter (Signed)
Patient calling to state that Rosann Auerbach is terminating her long term disability benefits, patient requests a note to be faxed over to [facility] to continue benefits until her appointment in July. Please call and advise.

## 2014-04-04 NOTE — Telephone Encounter (Signed)
NP's do not have the legal authority in the state of [place] to certify disability. Any letter written in the office has a fee associated with it.Was this collected.  What is the fax number associated with [employer]. If her appt in July is to recertify her disability it should be with the MD.

## 2014-04-04 NOTE — Telephone Encounter (Signed)
Pt calling stating that Rosann Auerbach is terminating her long term disability benefits and requesting a letter be written and faxed over to them stating for the pt to continue disability until next OV in July. Would you like to write a letter? Please advise

## 2014-04-08 ENCOUNTER — Ambulatory Visit (INDEPENDENT_AMBULATORY_CARE_PROVIDER_SITE_OTHER): Payer: BC Managed Care – PPO | Admitting: Internal Medicine

## 2014-04-08 ENCOUNTER — Encounter: Payer: Self-pay | Admitting: Internal Medicine

## 2014-04-08 VITALS — BP 134/90 | HR 109 | Temp 99.1°F | Wt 144.5 lb

## 2014-04-08 DIAGNOSIS — G8929 Other chronic pain: Secondary | ICD-10-CM

## 2014-04-08 DIAGNOSIS — R209 Unspecified disturbances of skin sensation: Secondary | ICD-10-CM

## 2014-04-08 DIAGNOSIS — M542 Cervicalgia: Secondary | ICD-10-CM

## 2014-04-08 DIAGNOSIS — R202 Paresthesia of skin: Secondary | ICD-10-CM

## 2014-04-08 DIAGNOSIS — Z982 Presence of cerebrospinal fluid drainage device: Secondary | ICD-10-CM

## 2014-04-08 DIAGNOSIS — F329 Major depressive disorder, single episode, unspecified: Secondary | ICD-10-CM

## 2014-04-08 DIAGNOSIS — F3289 Other specified depressive episodes: Secondary | ICD-10-CM

## 2014-04-08 DIAGNOSIS — F411 Generalized anxiety disorder: Secondary | ICD-10-CM

## 2014-04-08 NOTE — Progress Notes (Signed)
Subjective:    HPI    C/o worsening of her neck pain and upper back pain sx's. Pain is 8/10 in intensity. F/u GERD. C/o still being depressed, tired C/o neck pain - she was unable to get Lyrica, Cymbalta - due to PA.  C/o worsening lower neck pain, worse on the left, with pain to left upper back/shoulder and numbness to left arm extending distally, similar in area that seemed to start months ago in bed when she stretched and reached for an object on the bedside table and felt a pop and pain lasting several days and seemed better until the return worse than ever now mod to severe;  No LUE weakness or loss of grip strength.  Also mentions similar left lower back pain with LLE numbness as well but much less severe pain, mild only, with LLE numbness but no weakness.  No HA and Pt denies new neurological symptoms such as new headache, or facial or extremity weakness as above.  No prior hx of CVA. C/o worsening depressive symptoms, suicidal ideation, or panic; has ongoing anxiety.  H/o MS   Dr Terrace Arabia: sx's suggestive of a right internal capsule thalamus small vessel disease, which can be missed by MRI scan, MRI of the brain showed stable chronic changes detailed above, there was no acute lesions  She had a sleep study last night   Wt Readings from Last 3 Encounters:  04/08/14 144 lb 8 oz (65.545 kg)  11/20/13 147 lb (66.679 kg)  10/14/13 147 lb (66.679 kg)   BP Readings from Last 3 Encounters:  04/08/14 134/90  11/20/13 148/97  10/14/13 150/90      Past Medical History  Diagnosis Date  . Migraine   . Hyperlipidemia   . Cerebral ventricular shunt fitting or adjustment   . Anxiety   . Depression   . COMMON MIGRAINE 10/01/2007    Chronic    . GLUCOSE INTOLERANCE 10/01/2007    Qualifier: Diagnosis of  By: Jonny Ruiz MD, Len Blalock   . HYPERLIPIDEMIA 10/01/2007    Chronic. Lovaza is too $$$ Declined statins due to worries   . OBSTRUCTIVE SLEEP APNEA 10/01/2007    NPSG 2006:  AHI 9/hr Started  cpap 2009 successfully.     . Paresthesia    Past Surgical History  Procedure Laterality Date  . Cholecystectomy  1981  . Pituitary cyst w/subsequent shunt      reports that she has never smoked. She has never used smokeless tobacco. She reports that she drinks about .6 ounces of alcohol per week. She reports that she does not use illicit drugs. family history includes Alzheimer's disease in her mother; Cancer in her mother; Dementia in her mother; Heart disease in her father; Lung cancer in her other, other, and other; Melanoma in her other; Mental illness in her mother; Migraines in her mother. Allergies  Allergen Reactions  . Citalopram Hydrobromide     REACTION: low libido   Current Outpatient Prescriptions on File Prior to Visit  Medication Sig Dispense Refill  . aspirin (BAYER ASPIRIN) 325 MG tablet Take 1 tablet (325 mg total) by mouth daily.  100 tablet  3  . atorvastatin (LIPITOR) 20 MG tablet TAKE 1 TABLET (20 MG TOTAL) BY MOUTH DAILY.  90 tablet  3  . Calcium Carbonate-Vitamin D (CALCIUM-VITAMIN D) 500-200 MG-UNIT per tablet Take 1 tablet by mouth 2 (two) times daily with a meal.      . DULoxetine (CYMBALTA) 20 MG capsule Take 1 capsule (20  mg total) by mouth daily.  90 capsule  3  . escitalopram (LEXAPRO) 10 MG tablet TAKE 1 TABLET (10 MG TOTAL) BY MOUTH DAILY.  90 tablet  1  . LORazepam (ATIVAN) 0.5 MG tablet TAKE 1 TABLET BY MOUTH TWICE A DAY  180 tablet  0  . omega-3 acid ethyl esters (LOVAZA) 1 G capsule Take 2 capsules (2 g total) by mouth 2 (two) times daily.  360 capsule  1  . omeprazole (PRILOSEC) 40 MG capsule Take 1 capsule (40 mg total) by mouth daily.  30 capsule  5  . pregabalin (LYRICA) 75 MG capsule Take 1 capsule (75 mg total) by mouth 3 (three) times daily as needed.  90 capsule  2  . SUMAtriptan (IMITREX) 100 MG tablet Take 1 tablet (100 mg total) by mouth daily as needed.  10 tablet  11   No current facility-administered medications on file prior to visit.    Review of Systems  Musculoskeletal: Positive for back pain, neck pain and neck stiffness.  Psychiatric/Behavioral: Positive for dysphoric mood. Negative for suicidal ideas. The patient is nervous/anxious.     Constitutional: Negative for unexpected weight change, or unusual diaphoresis  HENT: Negative for tinnitus.   Eyes: Negative for photophobia and visual disturbance.  Respiratory: Negative for choking and stridor.   Gastrointestinal: Negative for vomiting and blood in stool.  Genitourinary: Negative for hematuria and decreased urine volume.  Musculoskeletal: Negative for acute joint swelling Skin: Negative for color change and wound.  Neurological: Negative for tremors and numbness other than noted  Neck is tender Psychiatric/Behavioral: Negative for decreased concentration or  hyperactivity.  Depressed     Objective:   Physical Exam BP 134/90  Pulse 109  Temp(Src) 99.1 F (37.3 C) (Oral)  Wt 144 lb 8 oz (65.545 kg)  SpO2 97% VS noted,  Constitutional: Pt appears well-developed and well-nourished.  HENT: Head: NCAT.  Right Ear: External ear normal.  Left Ear: External ear normal.  Eyes: Conjunctivae and EOM are normal. Pupils are equal, round, and reactive to light.  Neck: Normal range of motion. Neck supple.  Cardiovascular: Normal rate and regular rhythm.   Pulmonary/Chest: Effort normal and breath sounds normal.  Abd:  Soft, NT, non-distended, + BS Neurological: Pt is alert. Not confused , cn 2-12 intact, motor 5/5, with decr sens to LT to distal LUE, as well as LLE anterior thigh and lateral lower leg Spine tender to mid low c-spine only without red/tenderswelling Has some mild left lumbar paravertebral tender, no red/swelling Tenderness noted in neck, left trapezoid area as well, left shoulder NT, FROM Skin: Skin is warm. No erythema. No rash Psychiatric: Pt behavior is normal. Thought content normal. not nervous,no obvious depressed affect  Lab Results   Component Value Date   WBC 8.4 04/01/2013   HGB 13.2 04/01/2013   HCT 38.6 04/01/2013   PLT 401.0* 04/01/2013   GLUCOSE 94 04/01/2013   CHOL 281* 05/02/2011   TRIG 125.0 05/02/2011   HDL 57.20 05/02/2011   LDLDIRECT 207.7 05/02/2011   ALT 20 04/01/2013   AST 22 04/01/2013   NA 138 04/01/2013   K 4.0 04/01/2013   CL 102 04/01/2013   CREATININE 0.9 04/01/2013   BUN 12 04/01/2013   CO2 28 04/01/2013   TSH 1.85 04/01/2013       Assessment & Plan:

## 2014-04-08 NOTE — Telephone Encounter (Signed)
Dr. Terrace Arabia is already aware of this matter. FYI

## 2014-04-08 NOTE — Assessment & Plan Note (Signed)
Continue with current prescription therapy as reflected on the Med list.  

## 2014-04-08 NOTE — Telephone Encounter (Signed)
Left message for patient that after speaking to the doctor, nothing further in writing is going to be done until she has the NVC/EMG.  The doctor will discuss results and recommendations at that time.

## 2014-04-08 NOTE — Progress Notes (Signed)
Pre visit review using our clinic review tool, if applicable. No additional management support is needed unless otherwise documented below in the visit note. 

## 2014-04-08 NOTE — Assessment & Plan Note (Addendum)
EMG, NCS pending - Dr Terrace Arabia Worsening of sx's On Lyrica Unable to work

## 2014-04-08 NOTE — Patient Instructions (Signed)
Unable to work 

## 2014-04-08 NOTE — Assessment & Plan Note (Signed)
H/o Thalamic CVA vs MS vs other Brain MRI 2014 IMPRESSION:  No change from the MRI 12/04/2009. Moderate ventricular  enlargement is stable. This could be due to obstructive  hydrocephalous. Right ventricular shunt catheter unchanged.  Nodular soft tissue abnormality in the third ventricle is stable.  Adjacent to this is an area of susceptibility which could be a  metal clip based on cervical spine x-rays of 02/20/2003.  C spine MRI  Dr Terrace Arabia: sx's suggestive of a right internal capsule thalamus small vessel disease, which can be missed by MRI scan, MRI of the brain showed stable chronic changes detailed above, there was no acute lesions  Not better

## 2014-04-08 NOTE — Assessment & Plan Note (Signed)
EMG, NCS pending - Dr Terrace Arabia Worsening of sx's

## 2014-04-11 DIAGNOSIS — Z0279 Encounter for issue of other medical certificate: Secondary | ICD-10-CM

## 2014-04-15 ENCOUNTER — Telehealth: Payer: Self-pay | Admitting: Neurology

## 2014-04-15 ENCOUNTER — Ambulatory Visit (INDEPENDENT_AMBULATORY_CARE_PROVIDER_SITE_OTHER): Payer: BC Managed Care – PPO | Admitting: Neurology

## 2014-04-15 ENCOUNTER — Encounter (INDEPENDENT_AMBULATORY_CARE_PROVIDER_SITE_OTHER): Payer: BC Managed Care – PPO | Admitting: Radiology

## 2014-04-15 DIAGNOSIS — M542 Cervicalgia: Secondary | ICD-10-CM

## 2014-04-15 DIAGNOSIS — G911 Obstructive hydrocephalus: Secondary | ICD-10-CM

## 2014-04-15 DIAGNOSIS — Z0289 Encounter for other administrative examinations: Secondary | ICD-10-CM

## 2014-04-15 DIAGNOSIS — G43009 Migraine without aura, not intractable, without status migrainosus: Secondary | ICD-10-CM

## 2014-04-15 DIAGNOSIS — G919 Hydrocephalus, unspecified: Secondary | ICD-10-CM

## 2014-04-15 DIAGNOSIS — G8929 Other chronic pain: Secondary | ICD-10-CM

## 2014-04-15 DIAGNOSIS — M79609 Pain in unspecified limb: Secondary | ICD-10-CM

## 2014-04-15 NOTE — Telephone Encounter (Signed)
Patient calling to state that she would like to give more information to [name], related to what was discussed today, regarding her insurance. Please return call and advise.

## 2014-04-16 NOTE — Telephone Encounter (Signed)
Patient returning your call--thank you. °

## 2014-04-16 NOTE — Telephone Encounter (Signed)
I have talked with patient about her long-term disability forms, it was initiated by her primary care, it is better to be refilled by her primary care to avoid confusion. She agrees.

## 2014-04-16 NOTE — Telephone Encounter (Signed)
Patient calling again regarding her insurance--please call--thank you.

## 2014-04-16 NOTE — Telephone Encounter (Signed)
Patient has called numerous times today.  She is asking that we fill out the Long Term Disability Form.  I relayed that the doctor wanted her PCP to complete the LTD, but she will support the LTD in any way she can.  She needs something in a note stating that the patient is unable to work.  Rosann Auerbach is also requesting any notes from yesterday visit.  I will speak to doctor and see how to proceed.

## 2014-04-17 ENCOUNTER — Telehealth: Payer: Self-pay | Admitting: Neurology

## 2014-04-17 NOTE — Telephone Encounter (Signed)
The patient has called again.  I in turn called and spoke to [employer], Huntley Dec, for clarification.  I explained that Dr. Terrace Arabia wanted the LTD to be done by the patient's PCP.  She explained that they did receive a physical assessment from Dr. Posey Rea, but said if our doctor wants to write something in a note with her physical limitations and restrictions, that would be enough without the form filled out.  She also said the doctor can defer from any reply.  The information they receive will be sent to their medical review department.  They would also like the results from the patient's NCS/EMG when completed.  Please advise on what you would like to do.

## 2014-04-17 NOTE — Telephone Encounter (Signed)
Patient called to state that Rosann Auerbach is requesting her specific restrictions from her last office visit. Please fax office notes to  [employer]: fax [phone] representative is Huntley Dec.

## 2014-04-18 ENCOUNTER — Telehealth: Payer: Self-pay | Admitting: *Deleted

## 2014-04-18 NOTE — Telephone Encounter (Signed)
Lyrica PA is approved 04/10/14 until 04/10/2017. Pt informed

## 2014-04-18 NOTE — Procedures (Signed)
   NCS (NERVE CONDUCTION STUDY) WITH EMG (ELECTROMYOGRAPHY) REPORT   STUDY DATE: June 5th 2015 PATIENT NAME: Samantha [name] DOB: 1966-03-10 MRN: [id number]    TECHNOLOGIST: Kaylyn Lim ELECTROMYOGRAPHER: Levert Feinstein M.D.  CLINICAL INFORMATION:  48 years old female, complaining of neck pain, radiating to occipital region, right side worse than left,  FINDINGS: NERVE CONDUCTION STUDY: Bilateral median, ulnar mixed, sensory, and motor responses were normal.  NEEDLE ELECTROMYOGRAPHY: Selected needle examination was performed at right upper extremity muscles, right cervical paraspinal muscles.  Needle examination of right first also interossei, extensive digital Communis, pronator teres, biceps, triceps, deltoid was normal.  There was no spontaneous activity at right cervical paraspinal muscles, right C5, C6, C7.  IMPRESSION:   This is a normal study. There is no electrodiagnostic evidence of right upper extremity neuropathy, or right cervical radiculopathy  INTERPRETING PHYSICIAN:   Levert Feinstein M.D. Ph.D. [facility] [address], [address] [place], [place] [address] [phone]

## 2014-04-21 NOTE — Progress Notes (Addendum)
[facility]  PATIENT: Samantha [name] DOB: 1966/05/28  Ms. Lookabaugh is a 48 year old female returns for electrodiagnostic study today, last clinical visit was with Hoyle Sauer in January 2015.   HISTORY: She was originally referred by her primary care physician Dr. Alain Marion for evaluation of acute onset left-sided numbness  She had a history of pituitary cyst, was diagnosed at age 27, she presented with right visual loss, difficulty walking, short statue, she had a right frontal craniotomy at age 21, six weeks later, she developed worsening headaches, memory loss, was found to have obstructive hydrocephalus, she had right parietal VP shunt placement.   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,she denies significant difficulty, she does have frequent headaches, about once a month severe right-sided headaches, relieved by Imitrex,   In Mar 27 2013, she got up in the morning, trying to get off the bed, she suddenly felt something pulling in her neck, in the same time, she noticed numbness in her left arm, neck, but sparing left face, she also has some subjective weakness, the dense numbness lasting for 24 hours, much improved now, but she still not back to her normal self she still has intermittent numbness tingling involving the left arm, left leg   She was evaluated by PCP the same day, complained of neck pain, we have reviewed MRI of the brain  showed moderate enlargement of the third and lateral ventricles, unchanged. There is a right parietal shunt catheter extending into the right lateral ventricle with the tip in the temporal horn. This is unchanged. The fourth ventricle is not dilated. Right frontal craniotomy. Encephalomalacia right frontal lobe related to prior surgery. There is enlargement of the right  frontal horn related to volume loss in the right frontal lobe   MRI cervical showed focal medial foraminal disc protrusion at C5-6. Lateral foraminal disc osteophyte  complex on the right at C7- T1.   She was given prescription of Flexeril, tramadol, without significant improvement she had a history of obstructive sleep sleep apnea, she used to use CPAP machine, but has not had her settings checked for many years, she complains of nighttime snoring, excessive daytime fatigue, sleepiness  Laboratory Evaluation showed normal B12, TSH, ANA, CMP, CBC, elevated ESR 47,   UPDATE June 2nd 2015:  She complains of chronic neck pain, radiating pain to occipital region, which is constant, she has taken Flexeril, tramadol, which only help her mildly,   Today's electrodiagnostic study showed no evidence of right upper extremity neuropathy, or right cervical radiculopathy,  She has went on long term disability, she has frequent left vision blackout, persistent neck pain, could not stand up, or standing for too long,  REVIEW OF SYSTEMS: Full 14 system review of systems performed and notable only for those listed, all others are neg:  Fatigue, depression anxiety,  ALLERGIES: Allergies  Allergen Reactions  . Citalopram Hydrobromide     REACTION: low libido    HOME MEDICATIONS: Outpatient Prescriptions Prior to Visit  Medication Sig Dispense Refill  . aspirin (BAYER ASPIRIN) 325 MG tablet Take 1 tablet (325 mg total) by mouth daily.  100 tablet  3  . atorvastatin (LIPITOR) 20 MG tablet TAKE 1 TABLET (20 MG TOTAL) BY MOUTH DAILY.  90 tablet  3  . Calcium Carbonate-Vitamin D (CALCIUM-VITAMIN D) 500-200 MG-UNIT per tablet Take 1 tablet by mouth 2 (two) times daily with a meal.      . DULoxetine (CYMBALTA) 20 MG capsule Take 1  capsule (20 mg total) by mouth daily.  90 capsule  3  . escitalopram (LEXAPRO) 10 MG tablet TAKE 1 TABLET (10 MG TOTAL) BY MOUTH DAILY.  90 tablet  1  . LORazepam (ATIVAN) 0.5 MG tablet TAKE 1 TABLET BY MOUTH TWICE A DAY  180 tablet  0  . omega-3 acid ethyl esters (LOVAZA) 1 G capsule Take 2 capsules (2 g total) by mouth 2 (two) times daily.  360  capsule  1  . omeprazole (PRILOSEC) 40 MG capsule Take 1 capsule (40 mg total) by mouth daily.  30 capsule  5  . pregabalin (LYRICA) 75 MG capsule Take 1 capsule (75 mg total) by mouth 3 (three) times daily as needed.  90 capsule  2  . SUMAtriptan (IMITREX) 100 MG tablet Take 1 tablet (100 mg total) by mouth daily as needed.  10 tablet  11   No facility-administered medications prior to visit.    PAST MEDICAL HISTORY: Past Medical History  Diagnosis Date  . Migraine   . Hyperlipidemia   . Cerebral ventricular shunt fitting or adjustment   . Anxiety   . Depression   . COMMON MIGRAINE 10/01/2007    Chronic    . GLUCOSE INTOLERANCE 10/01/2007    Qualifier: Diagnosis of  By: Jenny Reichmann MD, Hunt Oris   . HYPERLIPIDEMIA 10/01/2007    Chronic. Lovaza is too $$$ Declined statins due to worries   . OBSTRUCTIVE SLEEP APNEA 10/01/2007    NPSG 2006:  AHI 9/hr Started cpap 2009 successfully.     . Paresthesia     PAST SURGICAL HISTORY: Past Surgical History  Procedure Laterality Date  . Cholecystectomy  1981  . Pituitary cyst w/subsequent shunt      FAMILY HISTORY: Family History  Problem Relation Age of Onset  . Dementia Mother   . Mental illness Mother     Alzheimer's  . Cancer Mother     kidney ca  . Heart disease Father   . Melanoma Other   . Lung cancer Other   . Lung cancer Other   . Lung cancer Other   . Alzheimer's disease Mother   . Migraines Mother     SOCIAL HISTORY: History   Social History  . Marital Status: Married    Spouse Name: Elta Guadeloupe    Number of Children: 0  . Years of Education: 12   Occupational History  . [employer]  .     Social History Main Topics  . Smoking status: Never Smoker   . Smokeless tobacco: Never Used  . Alcohol Use: 0.6 oz/week    1 Glasses of wine per week     Comment: Social  . Drug Use: No  . Sexual Activity: Yes   Other Topics Concern  . Not on file   Social History Narrative   Mother is in a NH.     Patient lives at home with her husband Elta Guadeloupe). 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    High school education.   Right handed.   Caffeine 1 cup daily.   She has no children     PHYSICAL EXAM  There were no vitals filed for this visit. There is no weight on file to calculate BMI.  Generalized: Well developed, mildly obese female in no acute distress  Head: normocephalic and atraumatic,. Oropharynx benign  Neck: Supple, no carotid bruits, tenderness to palpation paraspinals  Cardiac: Regular rate rhythm, no murmur  Musculoskeletal: No  deformity   Neurological examination   Mentation: Alert oriented to time, place, history taking. Follows all commands speech and language fluent  Cranial nerve II-XII: Pupils were equal round reactive to light extraocular movements were full, visual field were full on confrontational test. Facial sensation and strength were normal. hearing was intact to finger rubbing bilaterally. Uvula tongue midline. head turning and shoulder shrug were normal and symmetric.Tongue protrusion into cheek strength was normal. Motor: normal bulk and tone, full strength in the BUE, BLE, fine finger movements normal, no pronator drift. No focal weakness Sensory: normal and symmetric to light touch, pinprick, and  vibration  Coordination: finger-nose-finger, heel-to-shin bilaterally, no dysmetria Reflexes: Brachioradialis 2/2, biceps 2/2, triceps 2/2, patellar 3/3, Achilles 2/2, plantar responses were flexor on left, extensor on right. Gait and Station: Rising up from seated position without assistance, normal stance,  moderate stride, good arm swing, smooth turning, able to perform tiptoe, and heel walking without difficulty. Tandem gait is steady  DIAGNOSTIC DATA (LABS, IMAGING, TESTING) - I reviewed patient records, labs, notes, testing and imaging myself where available.  Lab Results  Component Value Date   WBC 8.4 04/01/2013   HGB 13.2 04/01/2013   HCT 38.6  04/01/2013   MCV 87.6 04/01/2013   PLT 401.0* 04/01/2013      Component Value Date/Time   NA 138 04/01/2013 1100   K 4.0 04/01/2013 1100   CL 102 04/01/2013 1100   CO2 28 04/01/2013 1100   GLUCOSE 94 04/01/2013 1100   GLUCOSE 108* 11/22/2006 0728   BUN 12 04/01/2013 1100   CREATININE 0.9 04/01/2013 1100   CALCIUM 9.7 04/01/2013 1100   PROT 7.7 04/01/2013 1100   ALBUMIN 3.9 04/01/2013 1100   AST 22 04/01/2013 1100   ALT 20 04/01/2013 1100   ALKPHOS 43 04/01/2013 1100   BILITOT 0.6 04/01/2013 1100   GFRNONAA 76.46 06/29/2010 1025   Lab Results  Component Value Date   CHOL 281* 05/02/2011   HDL 57.20 05/02/2011   LDLDIRECT 207.7 05/02/2011   TRIG 125.0 05/02/2011   CHOLHDL 5 05/02/2011   No results found for this basename: HGBA1C   Lab Results  Component Value Date   VITAMINB12 334 04/01/2013   Lab Results  Component Value Date   TSH 1.85 04/01/2013      ASSESSMENT AND PLAN  48 y.o. year old female  has a past medical history of Migraine; and history of pituitary cyst status post craniotomy, right VP shunt placement. We have reviewed MRI of the brain together again, which has  showed moderate enlargement of the third and lateral ventricles, unchanged. There is a right parietal shunt catheter extending into the right lateral ventricle with the tip in the temporal horn. This is unchanged. The fourth ventricle is not dilated. Right frontal craniotomy. Encephalomalacia right frontal lobe related to prior surgery. There is enlargement of the right frontal horn related to volume loss in the right frontal lobe    She has significant abnormality based on previous history, complains of neck pain, radiating pain with prolonged standing She should be on long-term disability, due to her medical condition, she could not tolerate prolonged standing, prolonged walking    Marcial Pacas, M.D. Ph.D. [facility] [address] [place], [place] [address] [phone]

## 2014-04-21 NOTE — Telephone Encounter (Signed)
I have called Elon Jester, Please fax her medical record, office visit note, and emg report.  [phone].

## 2014-04-21 NOTE — Telephone Encounter (Signed)
Patient has called again today, wanting to know the status of her message from last week concerning her LTD.  Sending to Dr. Terrace Arabia to advise.

## 2014-04-21 NOTE — Telephone Encounter (Signed)
Patient is calling again to follow up on the status of this message. Please return call to patient and advise.

## 2014-04-21 NOTE — Telephone Encounter (Signed)
Faxed reports

## 2014-04-21 NOTE — Addendum Note (Signed)
Addended by: Levert Feinstein on: 04/21/2014 02:44 PM   Modules accepted: Level of Service

## 2014-04-25 ENCOUNTER — Telehealth: Payer: Self-pay | Admitting: Neurology

## 2014-04-25 NOTE — Telephone Encounter (Signed)
Samantha [name] with [employer] calling about 04/21/2014 office note regarding work restrictions. They are questioning the work restrictions if the exam was normal. Please call [phone].

## 2014-04-28 NOTE — Telephone Encounter (Signed)
Left message with Lupita LeashDonna from [employer] that I have already discussed the patient's LTD claim with Huntley DecSara from [employer] and relayed that the patient's PCP is the one putting her out for the LTD.  This has been an ongoing issue and if she could call back and relay exactly what is needed we would like to get it resolved.

## 2014-05-01 ENCOUNTER — Telehealth: Payer: Self-pay | Admitting: Internal Medicine

## 2014-05-01 NOTE — Telephone Encounter (Signed)
Patient called and stated her claim Rep for [employer] is Maralyn SagoSarah @ [phone].  Thanks

## 2014-05-01 NOTE — Telephone Encounter (Signed)
Pt came by to follow up on status of receiving letter from Dr Posey ReaPlotnikov in reference to disability claim. Pt states during last appointment they spoke with Adero about requesting a letter from doctor. Pt has not received a response for this request.

## 2014-05-02 NOTE — Telephone Encounter (Signed)
Called patient. States that in addition to the forms that we already completed for her she is needing a letter from Dr. Posey ReaPlotnikov stating that she "unable to sit or stand for more than 15 minutes at a time, she cannot lift anything and her memory is bad."     Fax#: [phone],  Attn: Brand MalesSarah M.   Please advise.

## 2014-05-02 NOTE — Telephone Encounter (Signed)
Patient wants to add that she is also "unable to use and lift arms."

## 2014-05-02 NOTE — Telephone Encounter (Signed)
Ok Thx 

## 2014-05-06 ENCOUNTER — Encounter: Payer: Self-pay | Admitting: Internal Medicine

## 2014-05-06 ENCOUNTER — Telehealth: Payer: Self-pay | Admitting: Internal Medicine

## 2014-05-06 NOTE — Telephone Encounter (Signed)
Patient lost script for ativan and for another script that was given for a new med on 04/08/14 (patient does not remember what the name of the med was believes it was for cholesterol).  Patient is requesting new scripts.  Please advise.

## 2014-05-06 NOTE — Telephone Encounter (Signed)
Adero, has this been handled?

## 2014-05-07 MED ORDER — ATORVASTATIN CALCIUM 20 MG PO TABS: ORAL_TABLET | ORAL | Status: DC

## 2014-05-07 MED ORDER — LORAZEPAM 0.5 MG PO TABS: ORAL_TABLET | ORAL | Status: DC

## 2014-05-07 NOTE — Telephone Encounter (Signed)
Yes.  Done.

## 2014-05-07 NOTE — Telephone Encounter (Signed)
RX called in .

## 2014-05-07 NOTE — Telephone Encounter (Signed)
Ok Thx 

## 2014-05-09 ENCOUNTER — Telehealth: Payer: Self-pay | Admitting: *Deleted

## 2014-05-09 DIAGNOSIS — Z0289 Encounter for other administrative examinations: Secondary | ICD-10-CM

## 2014-05-09 NOTE — Telephone Encounter (Signed)
Pt stated md gave her prescriptions on her ativan & lovaza she has miss placed the prescription. Requesting md to call into [facility]. Is this ok...[name]/lmb

## 2014-05-09 NOTE — Telephone Encounter (Signed)
Ok to renew Thx 

## 2014-05-12 MED ORDER — LORAZEPAM 0.5 MG PO TABS: ORAL_TABLET | ORAL | Status: DC

## 2014-05-12 MED ORDER — OMEGA-3-ACID ETHYL ESTERS 1 G PO CAPS: 2.0000 g | ORAL_CAPSULE | Freq: Two times a day (BID) | ORAL | Status: DC

## 2014-05-12 NOTE — Telephone Encounter (Signed)
Notified pt [facility].../lmb 

## 2014-05-13 ENCOUNTER — Telehealth: Payer: Self-pay | Admitting: Internal Medicine

## 2014-05-13 NOTE — Telephone Encounter (Signed)
Received 7 pages from DDS, sent to Dr. Posey ReaPlotnikov. 05/13/14/ss.

## 2014-05-14 NOTE — Telephone Encounter (Signed)
Samantha [name], I understand her frustration, the form needs to be consistent, better to be completed by her primary care, who has initiate the process.  She has access to her chart. She may provide it as support documentations

## 2014-05-14 NOTE — Telephone Encounter (Signed)
Spoke to patient to see if her PCP has taken care of her LTD.   It is still not done, and the patient has not received a check in over a month.  She was also declined for social security disability.  She [facility] claim [facility] named Jomi at ext [phone].  She would like a note written by Dr. Terrace ArabiaYan that states she can't sit or stand for more that 15 minutes at a time and anything else that would be helpful.  The actual forms can be completed by PCP.

## 2014-05-15 ENCOUNTER — Telehealth: Payer: Self-pay | Admitting: Neurology

## 2014-05-15 NOTE — Telephone Encounter (Signed)
Patient calling to speak with Lupita Leashonna nurse regarding what was discussed yesterday, please return call to patient and advise.

## 2014-05-19 ENCOUNTER — Telehealth: Payer: Self-pay | Admitting: Neurology

## 2014-05-19 ENCOUNTER — Telehealth: Payer: Self-pay | Admitting: *Deleted

## 2014-05-19 NOTE — Telephone Encounter (Signed)
Patient would like a call back from nurse Lupita LeashDonna regarding some insurance questions she has been helping the patient with. Please call to advise.

## 2014-05-19 NOTE — Telephone Encounter (Signed)
Left msg on triage requesting call bck. Called pt back she stated the disablity form md completed was denied and she didn't get her check. He listed only the dates she was seen in the office but no dx was added so they denied her benefit. Inform pt will let her speak with Adero who does paperwork...[name]/lmb

## 2014-05-20 ENCOUNTER — Telehealth: Payer: Self-pay | Admitting: Neurology

## 2014-05-20 ENCOUNTER — Ambulatory Visit: Payer: BC Managed Care – PPO | Admitting: Nurse Practitioner

## 2014-05-20 NOTE — Telephone Encounter (Signed)
The doctor has called patient, please see phone note 05-19-14.

## 2014-05-20 NOTE — Telephone Encounter (Signed)
Spoke to patient, please see phone note 05-19-14.

## 2014-05-20 NOTE — Telephone Encounter (Signed)
Spoke to patient and she relayed that Dr. Terrace ArabiaYan has called her and explained that the patient should get all tests and medical records from the office to send to her disability insurance.  The patient is in the process of appealing denial.

## 2014-05-20 NOTE — Telephone Encounter (Signed)
I have patient, explained her EMG nerve conduction findings again, which was done in June 5th, there was no evidence of right upper extremity neuropathy, or right cervical radiculopathy.

## 2014-06-11 ENCOUNTER — Telehealth: Payer: Self-pay | Admitting: Internal Medicine

## 2014-06-11 NOTE — Telephone Encounter (Signed)
Patient is calling and states that the Lyrica is not effective for helping with her neck and back pain.  She wants to know if Dr. Posey ReaPlotnikov might recommend something else. Advised patient that Dr. Posey ReaPlotnikov is currently out of the office but will advise when he returns.

## 2014-06-16 NOTE — Telephone Encounter (Signed)
Pls sch OV Thx 

## 2014-06-17 NOTE — Telephone Encounter (Signed)
LVM for pt to call us to schedule OV.

## 2014-07-07 ENCOUNTER — Encounter: Payer: Self-pay | Admitting: Internal Medicine

## 2014-07-07 ENCOUNTER — Ambulatory Visit (INDEPENDENT_AMBULATORY_CARE_PROVIDER_SITE_OTHER): Payer: BC Managed Care – PPO | Admitting: Internal Medicine

## 2014-07-07 VITALS — BP 140/90 | HR 80 | Temp 98.2°F | Resp 16 | Ht <= 58 in | Wt 143.0 lb

## 2014-07-07 DIAGNOSIS — E785 Hyperlipidemia, unspecified: Secondary | ICD-10-CM

## 2014-07-07 DIAGNOSIS — R202 Paresthesia of skin: Secondary | ICD-10-CM

## 2014-07-07 DIAGNOSIS — R209 Unspecified disturbances of skin sensation: Secondary | ICD-10-CM

## 2014-07-07 DIAGNOSIS — E739 Lactose intolerance, unspecified: Secondary | ICD-10-CM

## 2014-07-07 DIAGNOSIS — F329 Major depressive disorder, single episode, unspecified: Secondary | ICD-10-CM

## 2014-07-07 DIAGNOSIS — F3289 Other specified depressive episodes: Secondary | ICD-10-CM

## 2014-07-07 DIAGNOSIS — Z23 Encounter for immunization: Secondary | ICD-10-CM

## 2014-07-07 DIAGNOSIS — R5383 Other fatigue: Secondary | ICD-10-CM

## 2014-07-07 DIAGNOSIS — F411 Generalized anxiety disorder: Secondary | ICD-10-CM

## 2014-07-07 DIAGNOSIS — D649 Anemia, unspecified: Secondary | ICD-10-CM

## 2014-07-07 DIAGNOSIS — Z0000 Encounter for general adult medical examination without abnormal findings: Secondary | ICD-10-CM

## 2014-07-07 DIAGNOSIS — R5381 Other malaise: Secondary | ICD-10-CM

## 2014-07-07 MED ORDER — PREGABALIN 75 MG PO CAPS: 75.0000 mg | ORAL_CAPSULE | Freq: Three times a day (TID) | ORAL | Status: DC | PRN

## 2014-07-07 NOTE — Assessment & Plan Note (Signed)
Situational - due to illness

## 2014-07-07 NOTE — Assessment & Plan Note (Signed)
Labs

## 2014-07-07 NOTE — Assessment & Plan Note (Signed)
Continue with current prescription therapy as reflected on the Med list.  

## 2014-07-07 NOTE — Assessment & Plan Note (Signed)
CFS - post-CVA 

## 2014-07-07 NOTE — Assessment & Plan Note (Signed)
A1c

## 2014-07-07 NOTE — Progress Notes (Deleted)
Pre visit review using our clinic review tool, if applicable. No additional management support is needed unless otherwise documented below in the visit note. 

## 2014-07-07 NOTE — Assessment & Plan Note (Signed)
Continue with current prescription therapy as reflected on the Med list. Labs  

## 2014-07-07 NOTE — Assessment & Plan Note (Signed)
4/14 ?etiol H/o Thalamic CVA vs MS vs other Brain MRI 2014 IMPRESSION:  No change from the MRI 12/04/2009. Moderate ventricular  enlargement is stable. This could be due to obstructive  hydrocephalous. Right ventricular shunt catheter unchanged.  Nodular soft tissue abnormality in the third ventricle is stable.  Adjacent to this is an area of susceptibility which could be a  metal clip based on cervical spine x-rays of 02/20/2003.  C spine MRI  Dr Terrace Arabia: sx's suggestive of a right internal capsule thalamus small vessel disease, which can be missed by MRI scan, MRI of the brain showed stable chronic changes detailed above, there was no acute lesions  Pt is disabled  On Lyrica

## 2014-07-07 NOTE — Assessment & Plan Note (Signed)
We discussed age appropriate health related issues, including available/recomended screening tests and vaccinations. We discussed a need for adhering to healthy diet and exercise. Labs/EKG were reviewed/ordered. All questions were answered.   

## 2014-07-08 ENCOUNTER — Other Ambulatory Visit (INDEPENDENT_AMBULATORY_CARE_PROVIDER_SITE_OTHER): Payer: BC Managed Care – PPO

## 2014-07-08 DIAGNOSIS — R5383 Other fatigue: Secondary | ICD-10-CM

## 2014-07-08 DIAGNOSIS — R5381 Other malaise: Secondary | ICD-10-CM

## 2014-07-08 DIAGNOSIS — R209 Unspecified disturbances of skin sensation: Secondary | ICD-10-CM

## 2014-07-08 DIAGNOSIS — Z0000 Encounter for general adult medical examination without abnormal findings: Secondary | ICD-10-CM

## 2014-07-08 LAB — HEPATIC FUNCTION PANEL
ALT: 19 U/L (ref 0–35)
AST: 21 U/L (ref 0–37)
Albumin: 3.5 g/dL (ref 3.5–5.2)
Alkaline Phosphatase: 45 U/L (ref 39–117)
Bilirubin, Direct: 0 mg/dL (ref 0.0–0.3)
Total Bilirubin: 0.3 mg/dL (ref 0.2–1.2)
Total Protein: 7.4 g/dL (ref 6.0–8.3)

## 2014-07-08 LAB — BASIC METABOLIC PANEL
BUN: 13 mg/dL (ref 6–23)
CO2: 29 mEq/L (ref 19–32)
Calcium: 9.2 mg/dL (ref 8.4–10.5)
Chloride: 103 mEq/L (ref 96–112)
Creatinine, Ser: 0.9 mg/dL (ref 0.4–1.2)
GFR: 72.22 mL/min (ref >60.00)
Glucose, Bld: 91 mg/dL (ref 70–99)
Potassium: 4 mEq/L (ref 3.5–5.1)
Sodium: 138 mEq/L (ref 135–145)

## 2014-07-08 LAB — CBC WITH DIFFERENTIAL/PLATELET
Basophils Absolute: 0.1 10*3/uL (ref 0.0–0.1)
Basophils Relative: 0.8 % (ref 0.0–3.0)
Eosinophils Absolute: 0.3 10*3/uL (ref 0.0–0.7)
Eosinophils Relative: 3.5 % (ref 0.0–5.0)
HCT: 29.5 % — ABNORMAL LOW (ref 36.0–46.0)
Hemoglobin: 9.4 g/dL — ABNORMAL LOW (ref 12.0–15.0)
Lymphocytes Relative: 29 % (ref 12.0–46.0)
Lymphs Abs: 2.3 10*3/uL (ref 0.7–4.0)
MCHC: 31.9 g/dL (ref 30.0–36.0)
MCV: 73.2 fl — ABNORMAL LOW (ref 78.0–100.0)
Monocytes Absolute: 0.7 10*3/uL (ref 0.1–1.0)
Monocytes Relative: 8.8 % (ref 3.0–12.0)
Neutro Abs: 4.6 10*3/uL (ref 1.4–7.7)
Neutrophils Relative %: 57.9 % (ref 43.0–77.0)
Platelets: 525 10*3/uL — ABNORMAL HIGH (ref 150.0–400.0)
RBC: 4.04 Mil/uL (ref 3.87–5.11)
RDW: 18.1 % — ABNORMAL HIGH (ref 11.5–15.5)
WBC: 7.9 10*3/uL (ref 4.0–10.5)

## 2014-07-08 LAB — URINALYSIS
Bilirubin Urine: NEGATIVE
Hgb urine dipstick: NEGATIVE
Ketones, ur: NEGATIVE
Leukocytes, UA: NEGATIVE
Nitrite: NEGATIVE
Specific Gravity, Urine: 1.02 (ref 1.000–1.030)
Total Protein, Urine: NEGATIVE
Urine Glucose: NEGATIVE
Urobilinogen, UA: 0.2 (ref 0.0–1.0)
pH: 6 (ref 5.0–8.0)

## 2014-07-08 LAB — VITAMIN D 25 HYDROXY (VIT D DEFICIENCY, FRACTURES): VITD: 40.22 ng/mL (ref 30.00–100.00)

## 2014-07-08 LAB — LIPID PANEL
Cholesterol: 250 mg/dL — ABNORMAL HIGH (ref 0–200)
HDL: 42.5 mg/dL (ref >39.00)
LDL Cholesterol: 185 mg/dL — ABNORMAL HIGH (ref 0–99)
NonHDL: 207.5
Total CHOL/HDL Ratio: 6
Triglycerides: 115 mg/dL (ref 0.0–149.0)
VLDL: 23 mg/dL (ref 0.0–40.0)

## 2014-07-08 LAB — TSH: TSH: 4.05 u[IU]/mL (ref 0.35–4.50)

## 2014-07-08 LAB — SEDIMENTATION RATE: Sed Rate: 88 mm/hr — ABNORMAL HIGH (ref 0–22)

## 2014-07-08 LAB — VITAMIN B12: Vitamin B-12: 335 pg/mL (ref 211–911)

## 2014-07-10 ENCOUNTER — Encounter: Payer: Self-pay | Admitting: Internal Medicine

## 2014-07-10 DIAGNOSIS — D649 Anemia, unspecified: Secondary | ICD-10-CM | POA: Insufficient documentation

## 2014-07-10 NOTE — Progress Notes (Signed)
Subjective:    HPI   The patient is here for a wellness exam.   F/u GERD. C/o still being depressed, tired She was able to get Lyrica, Cymbalta - PA.  F/u worsening lower neck pain, worse on the left, with pain to left upper back/shoulder and numbness to left arm extending distally, similar in area that seemed to start months ago in bed when she stretched and reached for an object on the bedside table and felt a pop and pain lasting several days and seemed better until the return worse than ever now mod to severe;  No LUE weakness or loss of grip strength.  Also mentions similar left lower back pain with LLE numbness as well but much less severe pain, mild only, with LLE numbness but no weakness.  No HA and Pt denies new neurological symptoms such as new headache, or facial or extremity weakness as above.  No prior hx of CVA. C/o worsening depressive symptoms, suicidal ideation, or panic; has ongoing anxiety.  H/o MS   Dr Terrace Arabia: sx's suggestive of a right internal capsule thalamus small vessel disease, which can be missed by MRI scan, MRI of the brain showed stable chronic changes detailed above, there was no acute lesions  She had a sleep study last night   Wt Readings from Last 3 Encounters:  07/07/14 143 lb (64.864 kg)  04/08/14 144 lb 8 oz (65.545 kg)  11/20/13 147 lb (66.679 kg)   BP Readings from Last 3 Encounters:  07/07/14 140/90  04/08/14 134/90  11/20/13 148/97      Past Medical History  Diagnosis Date  . Migraine   . Hyperlipidemia   . Cerebral ventricular shunt fitting or adjustment   . Anxiety   . Depression   . COMMON MIGRAINE 10/01/2007    Chronic    . GLUCOSE INTOLERANCE 10/01/2007    Qualifier: Diagnosis of  By: Jonny Ruiz MD, Len Blalock   . HYPERLIPIDEMIA 10/01/2007    Chronic. Lovaza is too $$$ Declined statins due to worries   . OBSTRUCTIVE SLEEP APNEA 10/01/2007    NPSG 2006:  AHI 9/hr Started cpap 2009 successfully.     . Paresthesia    Past Surgical  History  Procedure Laterality Date  . Cholecystectomy  1981  . Pituitary cyst w/subsequent shunt      reports that she has never smoked. She has never used smokeless tobacco. She reports that she drinks about .6 ounces of alcohol per week. She reports that she does not use illicit drugs. family history includes Alzheimer's disease in her mother; Cancer in her mother; Dementia in her mother; Heart disease in her father; Lung cancer in her other, other, and other; Melanoma in her other; Mental illness in her mother; Migraines in her mother. Allergies  Allergen Reactions  . Citalopram Hydrobromide     REACTION: low libido   Current Outpatient Prescriptions on File Prior to Visit  Medication Sig Dispense Refill  . aspirin (BAYER ASPIRIN) 325 MG tablet Take 1 tablet (325 mg total) by mouth daily.  100 tablet  3  . atorvastatin (LIPITOR) 20 MG tablet TAKE 1 TABLET (20 MG TOTAL) BY MOUTH DAILY.  90 tablet  3  . Calcium Carbonate-Vitamin D (CALCIUM-VITAMIN D) 500-200 MG-UNIT per tablet Take 1 tablet by mouth 2 (two) times daily with a meal.      . DULoxetine (CYMBALTA) 20 MG capsule Take 1 capsule (20 mg total) by mouth daily.  90 capsule  3  . escitalopram (  LEXAPRO) 10 MG tablet TAKE 1 TABLET (10 MG TOTAL) BY MOUTH DAILY.  90 tablet  1  . LORazepam (ATIVAN) 0.5 MG tablet TAKE 1 TABLET BY MOUTH TWICE A DAY PRN  180 tablet  0  . omega-3 acid ethyl esters (LOVAZA) 1 G capsule Take 2 capsules (2 g total) by mouth 2 (two) times daily.  360 capsule  1  . omeprazole (PRILOSEC) 40 MG capsule Take 1 capsule (40 mg total) by mouth daily.  30 capsule  5  . SUMAtriptan (IMITREX) 100 MG tablet Take 1 tablet (100 mg total) by mouth daily as needed.  10 tablet  11   No current facility-administered medications on file prior to visit.   Review of Systems  Musculoskeletal: Positive for back pain, neck pain and neck stiffness.  Psychiatric/Behavioral: Positive for dysphoric mood. Negative for suicidal ideas. The  patient is nervous/anxious.     Constitutional: Negative for unexpected weight change, or unusual diaphoresis  HENT: Negative for tinnitus.   Eyes: Negative for photophobia and visual disturbance.  Respiratory: Negative for choking and stridor.   Gastrointestinal: Negative for vomiting and blood in stool.  Genitourinary: Negative for hematuria and decreased urine volume.  Musculoskeletal: Negative for acute joint swelling Skin: Negative for color change and wound.  Neurological: Negative for tremors and numbness other than noted  Neck is tender Psychiatric/Behavioral: Negative for decreased concentration or  hyperactivity.  Depressed     Objective:   Physical Exam BP 140/90  Pulse 80  Temp(Src) 98.2 F (36.8 C) (Oral)  Resp 16  Ht  (1.448 m)  Wt 143 lb (64.864 kg)  BMI 30.94 kg/m2 VS noted,  Constitutional: Pt appears well-developed and well-nourished.  HENT: Head: NCAT.  Right Ear: External ear normal.  Left Ear: External ear normal.  Eyes: Conjunctivae and EOM are normal. Pupils are equal, round, and reactive to light.  Neck: Normal range of motion. Neck supple.  Cardiovascular: Normal rate and regular rhythm.   Pulmonary/Chest: Effort normal and breath sounds normal.  Abd:  Soft, NT, non-distended, + BS Neurological: Pt is alert. Not confused , cn 2-12 intact, motor 5/5, with decr sens to LT to distal LUE, as well as LLE anterior thigh and lateral lower leg Spine tender to mid low c-spine only without red/tenderswelling Has some mild left lumbar paravertebral tender, no red/swelling Tenderness noted in neck, left trapezoid area as well, left shoulder NT, FROM Skin: Skin is warm. No erythema. No rash Psychiatric: Pt behavior is normal. Thought content normal. not nervous,no obvious depressed affect  Lab Results  Component Value Date   WBC 7.9 07/08/2014   HGB 9.4* 07/08/2014   HCT 29.5* 07/08/2014   PLT 525.0* 07/08/2014   GLUCOSE 91 07/08/2014   CHOL 250* 07/08/2014    TRIG 115.0 07/08/2014   HDL 42.50 07/08/2014   LDLDIRECT 207.7 05/02/2011   LDLCALC 185* 07/08/2014   ALT 19 07/08/2014   AST 21 07/08/2014   NA 138 07/08/2014   K 4.0 07/08/2014   CL 103 07/08/2014   CREATININE 0.9 07/08/2014   BUN 13 07/08/2014   CO2 29 07/08/2014   TSH 4.05 07/08/2014       Assessment & Plan:

## 2014-07-10 NOTE — Assessment & Plan Note (Signed)
8/15 new: likely due to a GI blood loss GI ref Cont Omeprazole

## 2014-07-16 ENCOUNTER — Encounter: Payer: Self-pay | Admitting: Internal Medicine

## 2014-07-17 ENCOUNTER — Telehealth: Payer: Self-pay | Admitting: Internal Medicine

## 2014-07-17 NOTE — Telephone Encounter (Signed)
Pt would like to know if Dr. Posey Rea will prescribe her Percocet 10 for her back pain?? She states the Lyrica makes her very drowsy and she has to go to bed when taken.  She says she had some percocet left over from a dental procedure and it seems to help without causing drowsiness.

## 2014-07-18 NOTE — Telephone Encounter (Signed)
Oxycodone (percocet) is a highly addictive drug I would recommend Tramadol Thx

## 2014-07-22 MED ORDER — TRAMADOL HCL 50 MG PO TABS: 50.0000 mg | ORAL_TABLET | Freq: Two times a day (BID) | ORAL | Status: DC | PRN

## 2014-07-22 NOTE — Telephone Encounter (Signed)
Done. Pt informed.

## 2014-07-29 ENCOUNTER — Ambulatory Visit (INDEPENDENT_AMBULATORY_CARE_PROVIDER_SITE_OTHER): Payer: BC Managed Care – PPO | Admitting: Neurology

## 2014-07-29 ENCOUNTER — Telehealth: Payer: Self-pay | Admitting: Neurology

## 2014-07-29 ENCOUNTER — Encounter: Payer: Self-pay | Admitting: Neurology

## 2014-07-29 VITALS — BP 141/87 | HR 80 | Ht <= 58 in | Wt 143.0 lb

## 2014-07-29 DIAGNOSIS — M549 Dorsalgia, unspecified: Secondary | ICD-10-CM

## 2014-07-29 MED ORDER — MELOXICAM 7.5 MG PO TABS: 7.5000 mg | ORAL_TABLET | Freq: Two times a day (BID) | ORAL | Status: DC

## 2014-07-29 NOTE — Telephone Encounter (Signed)
Patient requesting sooner appointment due to her neck and back hurting worse, weakness in arms and legs, and her Tramadol has not been helping. Please return call and advise.

## 2014-07-29 NOTE — Telephone Encounter (Signed)
Called patient back Dr.Yan had cancellation patient wanted to come in sooner.

## 2014-07-29 NOTE — Progress Notes (Signed)
[facility]  PATIENT: Samantha [name] DOB: 1965/11/26  HISTORY OF PRESENT ILLNESS: Samantha [name] 48 -year-old female returns for followup headaches, history of hydrocephalus, neck pain  She was, referred by her primary care physician Dr. Alain [name] for evaluation of acute onset left-sided numbness in June 30th 2014.  She had a history of pituitary cyst, was diagnosed at age 32, she presented with right visual loss, difficulty walking, short statue, she had a right frontal craniotomy at age 3, 10 weeks later, she developed worsening headaches, memory loss, was found to have obstructive hydrocephalus, she had right parietal VP shunt placement.   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  In Mar 27 2013, she got up in the morning, trying to get off the bed, she suddenly felt something pulling in her neck, in the same time, she noticed numbness in her left arm, neck, but sparing left face, she also has some subjective weakness, the dense numbness lasting for 24 hours, much improved now, but she still not back to her normal self she still has intermittent numbness tingling involving the left arm, left leg   MRI of the brain in April 2014 showed moderate enlargement of the third and lateral ventricles, unchanged. There is a right parietal shunt catheter extending into the right lateral ventricle with the tip in the temporal horn. This is unchanged. The fourth ventricle is not dilated. Right frontal craniotomy. Encephalomalacia right frontal lobe related to prior surgery. There is enlargement of the right frontal horn related to volume loss in the right frontal lobe.   MRI cervical showed focal medial foraminal disc protrusion at C5-6. Lateral foraminal disc osteophyte complex on the right at C7- T1.  Electrodiagnostic study was normal, there was no evidence of right  cervical radiculopathy.   She was given prescription of Flexeril, tramadol, without significant improvement ,   She had a history of obstructive sleep sleep apnea, she used to use CPAP machine, but has not had her settings checked for many years, she complains of nighttime snoring, excessive daytime fatigue, sleepiness,   Laboratory Evaluation showed normal B12, TSH, ANA, CMP, CBC, elevated ESR 47 in 2014,   UPDATE Sep 15th 2015:  Last clinical visit was with Samantha [name], over the past few months, she complains of worsening neck pain,  neck pain, upper back pain, She has tried tramadol, Flexeril, without help, is taking Cymbalta, Lexapro, Lyrica, despite polypharmacy, she continued to have significant difficulty, generalized fatigue, bilateral upper and lower extremity heaviness, lack of stamina, frequent headaches, increased nocturia, she also complains of increased confusion, memory trouble, need help from her husband to maintain daily household chores,  Recent laboratory evaluation has demonstrated anemia, hemoglobin was 9.4, which is a change compared to previous 13.5, she is referred to hematologist,  She is in the process of applying for long-term disability, social disability  REVIEW OF SYSTEMS: Full 14 system review of systems performed and notable only for those listed, all others are neg: Fatigue, constipation, apnea, daytime sleepiness, frequent urination, back pain, gait difficulty, neck pain, neck stiffness, memory trouble, headaches, numbness, facial drooping, confusion,    ALLERGIES: Allergies  Allergen Reactions  . Citalopram Hydrobromide     REACTION: low libido    HOME MEDICATIONS: Outpatient Prescriptions Prior to Visit  Medication Sig Dispense Refill  . aspirin (BAYER ASPIRIN) 325 MG tablet Take 1 tablet (325 mg total)  by mouth daily.  100 tablet  3  . atorvastatin (LIPITOR) 20 MG tablet TAKE 1 TABLET (20 MG TOTAL) BY MOUTH DAILY.  90 tablet  3  . Calcium  Carbonate-Vitamin D (CALCIUM-VITAMIN D) 500-200 MG-UNIT per tablet Take 1 tablet by mouth 2 (two) times daily with a meal.      . DULoxetine (CYMBALTA) 20 MG capsule Take 1 capsule (20 mg total) by mouth daily.  90 capsule  3  . escitalopram (LEXAPRO) 10 MG tablet TAKE 1 TABLET (10 MG TOTAL) BY MOUTH DAILY.  90 tablet  1  . LORazepam (ATIVAN) 0.5 MG tablet TAKE 1 TABLET BY MOUTH TWICE A DAY PRN  180 tablet  0  . omega-3 acid ethyl esters (LOVAZA) 1 G capsule Take 2 capsules (2 g total) by mouth 2 (two) times daily.  360 capsule  1  . omeprazole (PRILOSEC) 40 MG capsule Take 1 capsule (40 mg total) by mouth daily.  30 capsule  5  . pregabalin (LYRICA) 75 MG capsule Take 1 capsule (75 mg total) by mouth 3 (three) times daily as needed.  90 capsule  2  . SUMAtriptan (IMITREX) 100 MG tablet Take 1 tablet (100 mg total) by mouth daily as needed.  10 tablet  11  . traMADol (ULTRAM) 50 MG tablet Take 1-2 tablets (50-100 mg total) by mouth 2 (two) times daily as needed.  100 tablet  0   No facility-administered medications prior to visit.    PAST MEDICAL HISTORY: Past Medical History  Diagnosis Date  . Migraine   . Hyperlipidemia   . Cerebral ventricular shunt fitting or adjustment   . Anxiety   . Depression   . COMMON MIGRAINE 10/01/2007    Chronic    . GLUCOSE INTOLERANCE 10/01/2007    Qualifier: Diagnosis of  By: Samantha Reichmann MD, Hunt Oris   . HYPERLIPIDEMIA 10/01/2007    Chronic. Lovaza is too $$$ Declined statins due to worries   . OBSTRUCTIVE SLEEP APNEA 10/01/2007    NPSG 2006:  AHI 9/hr Started cpap 2009 successfully.     . Paresthesia     PAST SURGICAL HISTORY: Past Surgical History  Procedure Laterality Date  . Cholecystectomy  1981  . Pituitary cyst w/subsequent shunt      FAMILY HISTORY: Family History  Problem Relation Age of Onset  . Dementia Mother   . Mental illness Mother     Alzheimer's  . Cancer Mother     kidney ca  . Heart disease Father   . Melanoma Other   . Lung  cancer Other   . Lung cancer Other   . Lung cancer Other   . Alzheimer's disease Mother   . Migraines Mother     SOCIAL HISTORY: History   Social History  . Marital Status: Married    Spouse Name: Elta Guadeloupe    Number of Children: 0  . Years of Education: 12   Occupational History  . [employer]  .     Social History Main Topics  . Smoking status: Never Smoker   . Smokeless tobacco: Never Used  . Alcohol Use: 0.6 oz/week    1 Glasses of wine per week     Comment: Social  . Drug Use: No  . Sexual Activity: Yes   Other Topics Concern  . Not on file   Social History Narrative   Mother is in a NH.    Patient lives at home with her husband Elta Gu[place]).  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    High school education.   Right handed.   Caffeine 1 cup daily.   She has no children     PHYSICAL EXAM  Filed Vitals:   07/29/14 1326  BP: 141/87  Pulse: 80  Height: 4' 10"  (1.473 m)  Weight: 143 lb (64.864 kg)   Body mass index is 29.89 kg/(m^2).  Generalized: Well developed, mildly obese female in no acute distress  Head: normocephalic and atraumatic,. Oropharynx benign  Neck: Supple, no carotid bruits, tenderness to palpation paraspinals  Cardiac: Regular rate rhythm, no murmur  Musculoskeletal: She has tenderness along bilateral upper trapezia, cervical, thoracic paraspinal regions upon deep palpation.  Neurological examination   Mentation: Alert oriented to time, place, history taking. Follows all commands speech and language fluent  Cranial nerve II-XII: Pupils were equal round reactive to light extraocular movements were full, visual field were full on confrontational test. Facial sensation and strength were normal. hearing was intact to finger rubbing bilaterally. Uvula tongue midline. head turning and shoulder shrug were normal and symmetric.Tongue protrusion into cheek strength was normal. Motor: normal bulk and tone, full strength in the  BUE, BLE, fine finger movements normal, no pronator drift. No focal weakness Sensory: normal and symmetric to light touch, pinprick, and  vibration  Coordination: finger-nose-finger, heel-to-shin bilaterally, no dysmetria Reflexes: Brachioradialis 2/2, biceps 2/2, triceps 2/2, patellar 3/3, Achilles 2/2, plantar responses were flexor on left, extensor on right. Gait and Station: Rising up from seated position without assistance, normal stance,  moderate stride, good arm swing, smooth turning, able to perform tiptoe, and heel walking without difficulty. Tandem gait is steady  DIAGNOSTIC DATA (LABS, IMAGING, TESTING) - I reviewed patient records, labs, notes, testing and imaging myself where available.  Lab Results  Component Value Date   WBC 7.9 07/08/2014   HGB 9.4* 07/08/2014   HCT 29.5* 07/08/2014   MCV 73.2* 07/08/2014   PLT 525.0* 07/08/2014      Component Value Date/Time   NA 138 07/08/2014 0825   K 4.0 07/08/2014 0825   CL 103 07/08/2014 0825   CO2 29 07/08/2014 0825   GLUCOSE 91 07/08/2014 0825   GLUCOSE 108* 11/22/2006 0728   BUN 13 07/08/2014 0825   CREATININE 0.9 07/08/2014 0825   CALCIUM 9.2 07/08/2014 0825   PROT 7.4 07/08/2014 0825   ALBUMIN 3.5 07/08/2014 0825   AST 21 07/08/2014 0825   ALT 19 07/08/2014 0825   ALKPHOS 45 07/08/2014 0825   BILITOT 0.3 07/08/2014 0825   GFRNONAA 76.46 06/29/2010 1025   Lab Results  Component Value Date   CHOL 250* 07/08/2014   HDL 42.50 07/08/2014   LDLCALC 185* 07/08/2014   LDLDIRECT 207.7 05/02/2011   TRIG 115.0 07/08/2014   CHOLHDL 6 07/08/2014   No results found for this basename: HGBA1C   Lab Results  Component Value Date   VITAMINB12 335 07/08/2014   Lab Results  Component Value Date   TSH 4.05 07/08/2014    ASSESSMENT AND PLAN  48 y.o. year old female  has a past medical history of Migraine; and history of pituitary cyst status post craniotomy, hydrocephalus, right VP shunt placement. History of left-sided numbness. Now presenting with  worsening memory trouble, fatigue, lack of stamina, gait difficulty, persistent neck pain, headaches, upper back pain.  MRI of the brain shows in April 2014 continues to demonstrate enlargement of the third and lateral ventricles. There is a right parietal shunt catheter extending into the  right lateral ventricle with the tip in the temporal horn. The fourth ventricle is not dilated.  Right frontal craniotomy. Encephalomalacia right frontal lobe  related to prior surgery. There is enlargement of the right frontal horn related to volume loss in the right frontal lobe.  MRI of the cervical has demonstrate focal medial foraminal disc protrusion at C5-6.  Lateral foraminal disc osteophyte complex on the right at C7- T1.  She has been on polypharmacy treatment, including Lyrica, Cymbalta, tramadol, without helping her symptoms, recent diagnosis of small cell anemia, workup is on the way.  She does have significant limitation based on her history, and significant MRI findings detailed above,  I have suggested physical therapy for her neck, low back pain, massage therapy  Will return to clinic in 4-6 months.  Marcial Pacas, M.D. Ph.D. [facility] [address], [place], [place] [address] [phone]

## 2014-07-29 NOTE — Progress Notes (Deleted)
[facility]  PATIENT: SONDI DESCH DOB: [birth year]  HISTORY OF PRESENT ILLNESS: Ms. Deshotels 48 -year-old female returns for followup headaches, history of hydrocephalus, neck pain  She was, referred by her primary care physician Dr. Alain Marion for evaluation of acute onset left-sided numbness in June 30th 2014.  She had a history of pituitary cyst, was diagnosed at age 27, she presented with right visual loss, difficulty walking, short statue, she had a right frontal craniotomy at age 65, 74 weeks later, she developed worsening headaches, memory loss, was found to have obstructive hydrocephalus, she had right parietal VP shunt placement.   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  In Mar 27 2013, she got up in the morning, trying to get off the bed, she suddenly felt something pulling in her neck, in the same time, she noticed numbness in her left arm, neck, but sparing left face, she also has some subjective weakness, the dense numbness lasting for 24 hours, much improved now, but she still not back to her normal self she still has intermittent numbness tingling involving the left arm, left leg   MRI of the brain in April 2014 showed moderate enlargement of the third and lateral ventricles, unchanged. There is a right parietal shunt catheter extending into the right lateral ventricle with the tip in the temporal horn. This is unchanged. The fourth ventricle is not dilated. Right frontal craniotomy. Encephalomalacia right frontal lobe related to prior surgery. There is enlargement of the right frontal horn related to volume loss in the right frontal lobe.   MRI cervical showed focal medial foraminal disc protrusion at C5-6. Lateral foraminal disc osteophyte complex on the right at C7- T1.  Electrodiagnostic study was normal, there was no evidence of right  cervical radiculopathy.   She was given prescription of Flexeril, tramadol, without significant improvement ,   She had a history of obstructive sleep sleep apnea, she used to use CPAP machine, but has not had her settings checked for many years, she complains of nighttime snoring, excessive daytime fatigue, sleepiness,   Laboratory Evaluation showed normal B12, TSH, ANA, CMP, CBC, elevated ESR 47 in 2014,   UPDATE Sep 15th 2015:  Last clinical visit was with Hoyle Sauer, over the past few months, she complains of worsening neck pain,  neck pain, upper back pain, She has tried tramadol, Flexeril, without help, is taking Cymbalta, Lexapro, Lyrica, despite polypharmacy, she continued to have significant difficulty, generalized fatigue, bilateral upper and lower extremity heaviness, lack of stamina, frequent headaches, increased nocturia, she also complains of increased confusion, memory trouble, need help from her husband to maintain daily household chores,  Recent laboratory evaluation has demonstrated anemia, hemoglobin was 9.4, which is a change compared to previous 13.5, she is referred to hematologist,  She is in the process of applying for long-term disability, social disability  REVIEW OF SYSTEMS: Full 14 system review of systems performed and notable only for those listed, all others are neg: Fatigue, constipation, apnea, daytime sleepiness, frequent urination, back pain, gait difficulty, neck pain, neck stiffness, memory trouble, headaches, numbness, facial drooping, confusion,    ALLERGIES: Allergies  Allergen Reactions  . Citalopram Hydrobromide     REACTION: low libido    HOME MEDICATIONS: Outpatient Prescriptions Prior to Visit  Medication Sig Dispense Refill  . aspirin (BAYER ASPIRIN) 325 MG tablet Take 1 tablet (325 mg total)  by mouth daily.  100 tablet  3  . atorvastatin (LIPITOR) 20 MG tablet TAKE 1 TABLET (20 MG TOTAL) BY MOUTH DAILY.  90 tablet  3  . Calcium  Carbonate-Vitamin D (CALCIUM-VITAMIN D) 500-200 MG-UNIT per tablet Take 1 tablet by mouth 2 (two) times daily with a meal.      . DULoxetine (CYMBALTA) 20 MG capsule Take 1 capsule (20 mg total) by mouth daily.  90 capsule  3  . escitalopram (LEXAPRO) 10 MG tablet TAKE 1 TABLET (10 MG TOTAL) BY MOUTH DAILY.  90 tablet  1  . LORazepam (ATIVAN) 0.5 MG tablet TAKE 1 TABLET BY MOUTH TWICE A DAY PRN  180 tablet  0  . omega-3 acid ethyl esters (LOVAZA) 1 G capsule Take 2 capsules (2 g total) by mouth 2 (two) times daily.  360 capsule  1  . omeprazole (PRILOSEC) 40 MG capsule Take 1 capsule (40 mg total) by mouth daily.  30 capsule  5  . pregabalin (LYRICA) 75 MG capsule Take 1 capsule (75 mg total) by mouth 3 (three) times daily as needed.  90 capsule  2  . SUMAtriptan (IMITREX) 100 MG tablet Take 1 tablet (100 mg total) by mouth daily as needed.  10 tablet  11  . traMADol (ULTRAM) 50 MG tablet Take 1-2 tablets (50-100 mg total) by mouth 2 (two) times daily as needed.  100 tablet  0   No facility-administered medications prior to visit.    PAST MEDICAL HISTORY: Past Medical History  Diagnosis Date  . Migraine   . Hyperlipidemia   . Cerebral ventricular shunt fitting or adjustment   . Anxiety   . Depression   . COMMON MIGRAINE 10/01/2007    Chronic    . GLUCOSE INTOLERANCE 10/01/2007    Qualifier: Diagnosis of  By: Jenny Reichmann MD, Hunt Oris   . HYPERLIPIDEMIA 10/01/2007    Chronic. Lovaza is too $$$ Declined statins due to worries   . OBSTRUCTIVE SLEEP APNEA 10/01/2007    NPSG 2006:  AHI 9/hr Started cpap 2009 successfully.     . Paresthesia     PAST SURGICAL HISTORY: Past Surgical History  Procedure Laterality Date  . Cholecystectomy  1981  . Pituitary cyst w/subsequent shunt      FAMILY HISTORY: Family History  Problem Relation Age of Onset  . Dementia Mother   . Mental illness Mother     Alzheimer's  . Cancer Mother     kidney ca  . Heart disease Father   . Melanoma Other   . Lung  cancer Other   . Lung cancer Other   . Lung cancer Other   . Alzheimer's disease Mother   . Migraines Mother     SOCIAL HISTORY: History   Social History  . Marital Status: Married    Spouse Name: Elta Guadeloupe    Number of Children: 0  . Years of Education: 12   Occupational History  . [employer]  .     Social History Main Topics  . Smoking status: Never Smoker   . Smokeless tobacco: Never Used  . Alcohol Use: 0.6 oz/week    1 Glasses of wine per week     Comment: Social  . Drug Use: No  . Sexual Activity: Yes   Other Topics Concern  . Not on file   Social History Narrative   Mother is in a NH.    Patient lives at home with her husband Elta Guadeloupe).  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    High school education.   Right handed.   Caffeine 1 cup daily.   She has no children     PHYSICAL EXAM  Filed Vitals:   07/29/14 1326  BP: 141/87  Pulse: 80  Height: 4' 10"  (1.473 m)  Weight: 143 lb (64.864 kg)   Body mass index is 29.89 kg/(m^2).  Generalized: Well developed, mildly obese female in no acute distress  Head: normocephalic and atraumatic,. Oropharynx benign  Neck: Supple, no carotid bruits, tenderness to palpation paraspinals  Cardiac: Regular rate rhythm, no murmur  Musculoskeletal: She has tenderness along bilateral upper trapezia, cervical, thoracic paraspinal regions upon deep palpation.  Neurological examination   Mentation: Alert oriented to time, place, history taking. Follows all commands speech and language fluent  Cranial nerve II-XII: Pupils were equal round reactive to light extraocular movements were full, visual field were full on confrontational test. Facial sensation and strength were normal. hearing was intact to finger rubbing bilaterally. Uvula tongue midline. head turning and shoulder shrug were normal and symmetric.Tongue protrusion into cheek strength was normal. Motor: normal bulk and tone, full strength in the  BUE, BLE, fine finger movements normal, no pronator drift. No focal weakness Sensory: normal and symmetric to light touch, pinprick, and  vibration  Coordination: finger-nose-finger, heel-to-shin bilaterally, no dysmetria Reflexes: Brachioradialis 2/2, biceps 2/2, triceps 2/2, patellar 3/3, Achilles 2/2, plantar responses were flexor on left, extensor on right. Gait and Station: Rising up from seated position without assistance, normal stance,  moderate stride, good arm swing, smooth turning, able to perform tiptoe, and heel walking without difficulty. Tandem gait is steady  DIAGNOSTIC DATA (LABS, IMAGING, TESTING) - I reviewed patient records, labs, notes, testing and imaging myself where available.  Lab Results  Component Value Date   WBC 7.9 07/08/2014   HGB 9.4* 07/08/2014   HCT 29.5* 07/08/2014   MCV 73.2* 07/08/2014   PLT 525.0* 07/08/2014      Component Value Date/Time   NA 138 07/08/2014 0825   K 4.0 07/08/2014 0825   CL 103 07/08/2014 0825   CO2 29 07/08/2014 0825   GLUCOSE 91 07/08/2014 0825   GLUCOSE 108* 11/22/2006 0728   BUN 13 07/08/2014 0825   CREATININE 0.9 07/08/2014 0825   CALCIUM 9.2 07/08/2014 0825   PROT 7.4 07/08/2014 0825   ALBUMIN 3.5 07/08/2014 0825   AST 21 07/08/2014 0825   ALT 19 07/08/2014 0825   ALKPHOS 45 07/08/2014 0825   BILITOT 0.3 07/08/2014 0825   GFRNONAA 76.46 06/29/2010 1025   Lab Results  Component Value Date   CHOL 250* 07/08/2014   HDL 42.50 07/08/2014   LDLCALC 185* 07/08/2014   LDLDIRECT 207.7 05/02/2011   TRIG 115.0 07/08/2014   CHOLHDL 6 07/08/2014   No results found for this basename: HGBA1C   Lab Results  Component Value Date   VITAMINB12 335 07/08/2014   Lab Results  Component Value Date   TSH 4.05 07/08/2014    ASSESSMENT AND PLAN  48 y.o. year old female  has a past medical history of Migraine; and history of pituitary cyst status post craniotomy, hydrocephalus, right VP shunt placement. History of left-sided numbness. Now presenting with  worsening memory trouble, fatigue, lack of stamina, gait difficulty, persistent neck pain, headaches, upper back pain.  MRI of the brain shows in April 2014 continues to demonstrate enlargement of the third and lateral ventricles. There is a right parietal shunt catheter extending into the  right lateral ventricle with the tip in the temporal horn. The fourth ventricle is not dilated.  Right frontal craniotomy. Encephalomalacia right frontal lobe  related to prior surgery. There is enlargement of the right frontal horn related to volume loss in the right frontal lobe.  MRI of the cervical has demonstrate focal medial foraminal disc protrusion at C5-6.  Lateral foraminal disc osteophyte complex on the right at C7- T1.  She has been on polypharmacy treatment, including Lyrica, Cymbalta, tramadol, without helping her symptoms, recent diagnosis of small cell anemia, workup is on the way.  She does have significant limitation based on her history, and significant MRI findings detailed above,  I have suggested physical therapy for her neck, low back pain, massage therapy  Will return to clinic in 4-6 months.  Marcial Pacas, M.D. Ph.D. [facility] [address], [place], [place] [address] [phone]   Deep tendon reflexes: Brachioradialis 2/2, biceps 2/2, triceps 2/2, patellar 3/3, Achilles 2/2, plantar responses were extensor on right, flexor at left.  Assessment and plan: 48 years old Caucasian female, with past medical history pituitary cyst, status post right craniotomy, hydrocephalus, right VP shunt placement, now presenting with acute onset of left-sided numbness.  MRI of the brain showed stable chronic changes detailed above, there was no acute lesions, she has constant neck pain, difficulty sitting still.  1  Neurontin 300 mg 3 times a day, hot compression for neck pain physical therapy 2. Short term disability paper work. 3. Return to clinic in 6 month with  Hoyle Sauer

## 2014-08-01 ENCOUNTER — Telehealth: Payer: Self-pay | Admitting: Neurology

## 2014-08-01 NOTE — Telephone Encounter (Signed)
Erica from Dr. Doreatha Martin office calling to get information regarding patient's work restrictions, please return call and advise.

## 2014-08-04 NOTE — Telephone Encounter (Signed)
Samantha [name], please let her know, she has access to her most recent office visit note in Sept 25th, I will not generate a sperate letter

## 2014-08-04 NOTE — Telephone Encounter (Signed)
Called this number and the number was a cell phone.  Called patient and spoke to her and she stated Dr.Yan was giving her a letter to her insurance company. I stated to patient I will ask Dr.Yan and give her a call back with Dr.Yan answer patient was fine with this.

## 2014-08-04 NOTE — Telephone Encounter (Signed)
Called patient and stated and stated she could get her medical Records I told Patient that I would send her call to medical records for correct process.Patient understood.

## 2014-08-07 ENCOUNTER — Telehealth: Payer: Self-pay | Admitting: *Deleted

## 2014-08-07 ENCOUNTER — Encounter: Payer: Self-pay | Admitting: Internal Medicine

## 2014-08-07 ENCOUNTER — Encounter: Payer: Self-pay | Admitting: *Deleted

## 2014-08-07 NOTE — Telephone Encounter (Signed)
Left msg on triage stating she is trying to refinance her home. Needing to get a letter from md stating that she is under Dr. Macario Golds care...[name]

## 2014-08-07 NOTE — Telephone Encounter (Signed)
Lucy, Pls write one Thx

## 2014-08-07 NOTE — Telephone Encounter (Signed)
Generated letter, notified pt letter ready for pick-up...Samantha [name]

## 2014-08-14 ENCOUNTER — Telehealth: Payer: Self-pay | Admitting: Neurology

## 2014-08-14 NOTE — Telephone Encounter (Signed)
Patient calling wanting to speak with Annabelle Harmanana regarding her insurance claim, please return call and advise.

## 2014-08-14 NOTE — Telephone Encounter (Signed)
Called patient back and she wanting her medical records I will send message to medical records.

## 2014-08-19 ENCOUNTER — Telehealth: Payer: Self-pay | Admitting: Neurology

## 2014-08-19 NOTE — Telephone Encounter (Signed)
[name] with Dr. Homero FellersFrank Polanco's office @ [phone], requesting an Invoice and W9.  Please fax to [phone].  Faxed a Q&A for Dr. Terrace ArabiaYan to complete from Dr. Hilda BladesPolanco.  Received a fax stating a fee was needed in order to complete.  Please call and advise.

## 2014-08-19 NOTE — Telephone Encounter (Signed)
I am not sure what this is Maybe ask Angie or medical records.

## 2014-08-20 ENCOUNTER — Ambulatory Visit: Payer: BC Managed Care – PPO | Admitting: Internal Medicine

## 2014-08-22 NOTE — Telephone Encounter (Signed)
Gave to [name] and Samantha [name]. Samantha ScheuermannAda

## 2014-08-22 NOTE — Telephone Encounter (Signed)
Angie, I'm not sure if this goes to you.

## 2014-08-23 DIAGNOSIS — Z0279 Encounter for issue of other medical certificate: Secondary | ICD-10-CM

## 2014-08-26 ENCOUNTER — Telehealth: Payer: Self-pay | Admitting: Internal Medicine

## 2014-08-26 NOTE — Telephone Encounter (Signed)
Called and left Samantha [name] a message need more information about what she is needing as far as W9. I was told that Samantha [name] was out all week I left her a voice mail to return my phone call when she returns to work.

## 2014-08-26 NOTE — Telephone Encounter (Signed)
Called patient back I have called number three times that was given for W9 The company had no [name] that worked there

## 2014-08-26 NOTE — Telephone Encounter (Signed)
Patient need refill of Ativan 1 mg stated that 0.5 mg was not enough. She also need a 90 day supply.

## 2014-08-28 ENCOUNTER — Telehealth: Payer: Self-pay | Admitting: *Deleted

## 2014-08-28 NOTE — Telephone Encounter (Signed)
Ok to ref Ativan - same dose Thx

## 2014-08-28 NOTE — Telephone Encounter (Signed)
Samantha [name] calling back about W9 form, states that she just needs an invoice with the amount that Dr Terrace ArabiaYan is requesting so that bill can be paid. Call was transferred to billing department.

## 2014-08-29 ENCOUNTER — Encounter: Payer: Self-pay | Admitting: Internal Medicine

## 2014-08-29 ENCOUNTER — Other Ambulatory Visit (INDEPENDENT_AMBULATORY_CARE_PROVIDER_SITE_OTHER): Payer: BC Managed Care – PPO

## 2014-08-29 ENCOUNTER — Ambulatory Visit (INDEPENDENT_AMBULATORY_CARE_PROVIDER_SITE_OTHER): Payer: BC Managed Care – PPO | Admitting: Internal Medicine

## 2014-08-29 VITALS — BP 130/90 | HR 115 | Temp 98.1°F | Wt 141.0 lb

## 2014-08-29 DIAGNOSIS — M545 Low back pain, unspecified: Secondary | ICD-10-CM

## 2014-08-29 DIAGNOSIS — M79605 Pain in left leg: Secondary | ICD-10-CM

## 2014-08-29 DIAGNOSIS — R202 Paresthesia of skin: Secondary | ICD-10-CM

## 2014-08-29 DIAGNOSIS — D500 Iron deficiency anemia secondary to blood loss (chronic): Secondary | ICD-10-CM | POA: Diagnosis not present

## 2014-08-29 DIAGNOSIS — E162 Hypoglycemia, unspecified: Secondary | ICD-10-CM

## 2014-08-29 LAB — CBC WITH DIFFERENTIAL/PLATELET
Basophils Absolute: 0.1 10*3/uL (ref 0.0–0.1)
Basophils Relative: 0.7 % (ref 0.0–3.0)
Eosinophils Absolute: 0.2 10*3/uL (ref 0.0–0.7)
Eosinophils Relative: 2.8 % (ref 0.0–5.0)
HCT: 30.5 % — ABNORMAL LOW (ref 36.0–46.0)
Hemoglobin: 9.6 g/dL — ABNORMAL LOW (ref 12.0–15.0)
Lymphocytes Relative: 26.3 % (ref 12.0–46.0)
Lymphs Abs: 2.3 10*3/uL (ref 0.7–4.0)
MCHC: 31.6 g/dL (ref 30.0–36.0)
MCV: 72.2 fl — ABNORMAL LOW (ref 78.0–100.0)
Monocytes Absolute: 0.8 10*3/uL (ref 0.1–1.0)
Monocytes Relative: 8.7 % (ref 3.0–12.0)
Neutro Abs: 5.3 10*3/uL (ref 1.4–7.7)
Neutrophils Relative %: 61.5 % (ref 43.0–77.0)
Platelets: 531 10*3/uL — ABNORMAL HIGH (ref 150.0–400.0)
RBC: 4.23 Mil/uL (ref 3.87–5.11)
RDW: 18.5 % — ABNORMAL HIGH (ref 11.5–15.5)
WBC: 8.6 10*3/uL (ref 4.0–10.5)

## 2014-08-29 LAB — IBC PANEL
Iron: 20 ug/dL — ABNORMAL LOW (ref 42–145)
Saturation Ratios: 4.1 % — ABNORMAL LOW (ref 20.0–50.0)
Transferrin: 348.6 mg/dL (ref 212.0–360.0)

## 2014-08-29 LAB — BASIC METABOLIC PANEL
BUN: 12 mg/dL (ref 6–23)
CO2: 25 mEq/L (ref 19–32)
Calcium: 9.8 mg/dL (ref 8.4–10.5)
Chloride: 105 mEq/L (ref 96–112)
Creatinine, Ser: 1 mg/dL (ref 0.4–1.2)
GFR: 62.28 mL/min (ref >60.00)
Glucose, Bld: 112 mg/dL — ABNORMAL HIGH (ref 70–99)
Potassium: 3.6 mEq/L (ref 3.5–5.1)
Sodium: 138 mEq/L (ref 135–145)

## 2014-08-29 LAB — GLUCOSE, POCT (MANUAL RESULT ENTRY): POC Glucose: 114 mg/dl — AB (ref 70–99)

## 2014-08-29 LAB — SEDIMENTATION RATE: Sed Rate: 72 mm/hr — ABNORMAL HIGH (ref 0–22)

## 2014-08-29 LAB — HEMOGLOBIN A1C: Hgb A1c MFr Bld: 5.8 % (ref 4.6–6.5)

## 2014-08-29 MED ORDER — LORAZEPAM 0.5 MG PO TABS: ORAL_TABLET | ORAL | Status: DC

## 2014-08-29 MED ORDER — LORAZEPAM 1 MG PO TABS: 1.0000 mg | ORAL_TABLET | Freq: Two times a day (BID) | ORAL | Status: DC | PRN

## 2014-08-29 MED ORDER — HYDROCODONE-ACETAMINOPHEN 5-325 MG PO TABS: 1.0000 | ORAL_TABLET | Freq: Two times a day (BID) | ORAL | Status: DC | PRN

## 2014-08-29 NOTE — Telephone Encounter (Signed)
[facility]. Pt has been notified...[name]/lmb

## 2014-08-29 NOTE — Progress Notes (Signed)
Subjective:    HPI    C/o L side - (back, leg, arm) hurts a lot - tramadol not working; pain is 6/10  F/u GERD. C/o still being depressed, tired She was able to get Lyrica, Cymbalta - [place].  F/u worsening lower neck pain, worse on the left, with pain to left upper back/shoulder and numbness to left arm extending distally, similar in area that seemed to start months ago in bed when she stretched and reached for an object on the bedside table and felt a pop and pain lasting several days and seemed better until the return worse than ever now mod to severe;  No LUE weakness or loss of grip strength.  Also mentions similar left lower back pain with LLE numbness as well but much less severe pain, mild only, with LLE numbness but no weakness.  No HA and Pt denies new neurological symptoms such as new headache, or facial or extremity weakness as above.  No prior hx of CVA. C/o worsening depressive symptoms, suicidal ideation, or panic; has ongoing anxiety.  H/o MS   Dr Terrace ArabiaYan: sx's suggestive of a right internal capsule thalamus small vessel disease, which can be missed by MRI scan, MRI of the brain showed stable chronic changes detailed above, there was no acute lesions  She had a sleep study last night   Wt Readings from Last 3 Encounters:  08/29/14 141 lb (63.957 kg)  07/29/14 143 lb (64.864 kg)  07/07/14 143 lb (64.864 kg)   BP Readings from Last 3 Encounters:  08/29/14 130/90  07/29/14 141/87  07/07/14 140/90      Past Medical History  Diagnosis Date  . Migraine   . Hyperlipidemia   . Cerebral ventricular shunt fitting or adjustment   . Anxiety   . Depression   . COMMON MIGRAINE 10/01/2007    Chronic    . GLUCOSE INTOLERANCE 10/01/2007    Qualifier: Diagnosis of  By: Jonny RuizJohn MD, Len BlalockJames W   . HYPERLIPIDEMIA 10/01/2007    Chronic. Lovaza is too $$$ Declined statins due to worries   . OBSTRUCTIVE SLEEP APNEA 10/01/2007    NPSG 2006:  AHI 9/hr Started cpap 2009 successfully.     .  Paresthesia    Past Surgical History  Procedure Laterality Date  . Cholecystectomy  1981  . Pituitary cyst w/subsequent shunt      reports that she has never smoked. She has never used smokeless tobacco. She reports that she drinks about .6 ounces of alcohol per week. She reports that she does not use illicit drugs. family history includes Alzheimer's disease in her mother; Cancer in her mother; Dementia in her mother; Heart disease in her father; Lung cancer in her other, other, and other; Melanoma in her other; Mental illness in her mother; Migraines in her mother. Allergies  Allergen Reactions  . Citalopram Hydrobromide     REACTION: low libido   Current Outpatient Prescriptions on File Prior to Visit  Medication Sig Dispense Refill  . aspirin (BAYER ASPIRIN) 325 MG tablet Take 1 tablet (325 mg total) by mouth daily.  100 tablet  3  . atorvastatin (LIPITOR) 20 MG tablet TAKE 1 TABLET (20 MG TOTAL) BY MOUTH DAILY.  90 tablet  3  . Calcium Carbonate-Vitamin D (CALCIUM-VITAMIN D) 500-200 MG-UNIT per tablet Take 1 tablet by mouth 2 (two) times daily with a meal.      . DULoxetine (CYMBALTA) 20 MG capsule Take 1 capsule (20 mg total) by mouth daily.  90 capsule  3  . escitalopram (LEXAPRO) 10 MG tablet TAKE 1 TABLET (10 MG TOTAL) BY MOUTH DAILY.  90 tablet  1  . LORazepam (ATIVAN) 0.5 MG tablet TAKE 1 TABLET BY MOUTH TWICE A DAY PRN  180 tablet  0  . meloxicam (MOBIC) 7.5 MG tablet Take 1 tablet (7.5 mg total) by mouth 2 (two) times daily.  60 tablet  6  . omega-3 acid ethyl esters (LOVAZA) 1 G capsule Take 2 capsules (2 g total) by mouth 2 (two) times daily.  360 capsule  1  . omeprazole (PRILOSEC) 40 MG capsule Take 1 capsule (40 mg total) by mouth daily.  30 capsule  5  . pregabalin (LYRICA) 75 MG capsule Take 1 capsule (75 mg total) by mouth 3 (three) times daily as needed.  90 capsule  2  . SUMAtriptan (IMITREX) 100 MG tablet Take 1 tablet (100 mg total) by mouth daily as needed.  10  tablet  11  . traMADol (ULTRAM) 50 MG tablet Take 1-2 tablets (50-100 mg total) by mouth 2 (two) times daily as needed.  100 tablet  0   No current facility-administered medications on file prior to visit.   Review of Systems  Musculoskeletal: Positive for back pain, neck pain and neck stiffness.  Psychiatric/Behavioral: Positive for dysphoric mood. Negative for suicidal ideas. The patient is nervous/anxious.     Constitutional: Negative for unexpected weight change, or unusual diaphoresis  HENT: Negative for tinnitus.   Eyes: Negative for photophobia and visual disturbance.  Respiratory: Negative for choking and stridor.   Gastrointestinal: Negative for vomiting and blood in stool.  Genitourinary: Negative for hematuria and decreased urine volume.  Musculoskeletal: Negative for acute joint swelling Skin: Negative for color change and wound.  Neurological: Negative for tremors and numbness other than noted  Neck is tender Psychiatric/Behavioral: Negative for decreased concentration or  hyperactivity.  Depressed     Objective:   Physical Exam BP 130/90  Pulse 115  Temp(Src) 98.1 F (36.7 C) (Oral)  Wt 141 lb (63.957 kg)  SpO2 97% VS noted,  Constitutional: Pt appears well-developed and well-nourished.  HENT: Head: NCAT.  Right Ear: External ear normal.  Left Ear: External ear normal.  Eyes: Conjunctivae and EOM are normal. Pupils are equal, round, and reactive to light.  Neck: Normal range of motion. Neck supple.  Cardiovascular: Normal rate and regular rhythm.   Pulmonary/Chest: Effort normal and breath sounds normal.  Abd:  Soft, NT, non-distended, + BS Neurological: Pt is alert. Not confused , cn 2-12 intact, motor 5/5, with decr sens to LT to distal LUE, as well as LLE anterior thigh and lateral lower leg Spine tender to mid low c-spine only without red/tenderswelling Has some mild left lumbar paravertebral tender, no red/swelling Tenderness noted in neck, left trapezoid  area as well, left shoulder NT, FROM Skin: Skin is warm. No erythema. No rash Psychiatric: Pt behavior is normal. Thought content normal. not nervous,no obvious depressed affect  Lab Results  Component Value Date   WBC 7.9 07/08/2014   HGB 9.4* 07/08/2014   HCT 29.5* 07/08/2014   PLT 525.0* 07/08/2014   GLUCOSE 91 07/08/2014   CHOL 250* 07/08/2014   TRIG 115.0 07/08/2014   HDL 42.50 07/08/2014   LDLDIRECT 207.7 05/02/2011   LDLCALC 185* 07/08/2014   ALT 19 07/08/2014   AST 21 07/08/2014   NA 138 07/08/2014   K 4.0 07/08/2014   CL 103 07/08/2014   CREATININE 0.9 07/08/2014  BUN 13 07/08/2014   CO2 29 07/08/2014   TSH 4.05 07/08/2014       Assessment & Plan:

## 2014-08-29 NOTE — Assessment & Plan Note (Addendum)
S/p thalamic CVA Tramadol did not help Norco bid prn  Potential benefits of a long term opioids use as well as potential risks (i.e. addiction risk, apnea etc) and complications (i.e. Somnolence, constipation and others) were explained to the patient and were aknowledged.

## 2014-08-29 NOTE — Assessment & Plan Note (Signed)
Adjacent to this is an area of susceptibility which could be a  metal clip based on cervical spine x-rays of 02/20/2003.  C spine MRI  Dr Terrace ArabiaYan: sx's suggestive of a right internal capsule thalamus small vessel disease, which can be missed by MRI scan, MRI of the brain showed stable chronic changes detailed above, there was no acute lesions

## 2014-08-29 NOTE — Assessment & Plan Note (Signed)
GI appt pending Repeat labs

## 2014-08-29 NOTE — Progress Notes (Signed)
Pre visit review using our clinic review tool, if applicable. No additional management support is needed unless otherwise documented below in the visit note. 

## 2014-08-30 ENCOUNTER — Other Ambulatory Visit: Payer: Self-pay | Admitting: Internal Medicine

## 2014-09-01 ENCOUNTER — Other Ambulatory Visit: Payer: Self-pay | Admitting: Internal Medicine

## 2014-09-01 MED ORDER — IRON POLYSACCH CMPLX-B12-FA 150-0.025-1 MG PO CAPS: 1.0000 | ORAL_CAPSULE | Freq: Every day | ORAL | Status: DC

## 2014-09-02 ENCOUNTER — Telehealth: Payer: Self-pay | Admitting: *Deleted

## 2014-09-02 NOTE — Telephone Encounter (Signed)
Per pt- Dr. Posey ReaPlotnikov needs to call Dr. Felton ClintonFrank Polanco with [employer] at [phone].

## 2014-09-02 NOTE — Telephone Encounter (Signed)
I'm [place] of the country this week Thx

## 2014-09-03 NOTE — Telephone Encounter (Signed)
Sorry- I meant to hold this until you got back.

## 2014-09-11 ENCOUNTER — Encounter: Payer: Self-pay | Admitting: Internal Medicine

## 2014-09-11 ENCOUNTER — Ambulatory Visit (INDEPENDENT_AMBULATORY_CARE_PROVIDER_SITE_OTHER): Payer: BC Managed Care – PPO | Admitting: Internal Medicine

## 2014-09-11 ENCOUNTER — Other Ambulatory Visit (INDEPENDENT_AMBULATORY_CARE_PROVIDER_SITE_OTHER): Payer: BC Managed Care – PPO

## 2014-09-11 VITALS — BP 126/74 | HR 76 | Ht <= 58 in | Wt 141.0 lb

## 2014-09-11 DIAGNOSIS — D509 Iron deficiency anemia, unspecified: Secondary | ICD-10-CM

## 2014-09-11 DIAGNOSIS — Z1211 Encounter for screening for malignant neoplasm of colon: Secondary | ICD-10-CM

## 2014-09-11 LAB — IGA: IgA: 241 mg/dL (ref 68–378)

## 2014-09-11 MED ORDER — MOVIPREP 100 G PO SOLR
1.0000 | Freq: Once | ORAL | Status: DC
Start: 2014-09-11 — End: 2014-10-29

## 2014-09-11 MED ORDER — INTEGRA PLUS PO CAPS: 1.0000 | ORAL_CAPSULE | Freq: Every day | ORAL | Status: DC

## 2014-09-11 NOTE — Progress Notes (Signed)
Patient ID: Samantha [name], female   DOB: Oct 25, 1966, 48 y.o.   MRN: [id number] HPI: Samantha [name] is a 48 year old female with a past medical history of remote pituitary cyst status post craniotomy and later obstructive hydrocephalus requiring VP shunt, thalamic small vessel disease, hyperlipidemia, glucose intolerance, GERD, who is seen in consultation at the request of Dr. Alain Marion to evaluate iron deficiency anemia. She is here alone today. She reports she is feeling well and is without specific complaint. She has a history of GERD but is now off Prilosec. She denies frequent heartburn, dysphagia or odynophagia. Bowel movements have been fairly normal for her though at times she is constipated. She denies abdominal pain. No nausea or vomiting. She denies blood in her stool or melena. She does continue to have her periods they are slightly irregular. She is not taking IV iron at present. She has never had a colonoscopy. Family history is strong for CAD in her mother had Alzheimer's disease. Denies a family history of colorectal cancer. She does take daily aspirin.  Past Medical History  Diagnosis Date  . Migraine   . Hyperlipidemia   . Cerebral ventricular shunt fitting or adjustment   . Anxiety   . Depression   . COMMON MIGRAINE 10/01/2007    Chronic    . GLUCOSE INTOLERANCE 10/01/2007    Qualifier: Diagnosis of  By: Jenny Reichmann MD, Hunt Oris   . HYPERLIPIDEMIA 10/01/2007    Chronic. Lovaza is too $$$ Declined statins due to worries   . OBSTRUCTIVE SLEEP APNEA 10/01/2007    NPSG 2006:  AHI 9/hr Started cpap 2009 successfully.     . Paresthesia     Past Surgical History  Procedure Laterality Date  . Cholecystectomy  1981  . Pituitary cyst w/subsequent shunt      Outpatient Prescriptions Prior to Visit  Medication Sig Dispense Refill  . aspirin (BAYER ASPIRIN) 325 MG tablet Take 1 tablet (325 mg total) by mouth daily.  100 tablet  3  . atorvastatin (LIPITOR) 20 MG tablet TAKE 1 TABLET (20 MG  TOTAL) BY MOUTH DAILY.  90 tablet  3  . Calcium Carbonate-Vitamin D (CALCIUM-VITAMIN D) 500-200 MG-UNIT per tablet Take 1 tablet by mouth 2 (two) times daily with a meal.      . DULoxetine (CYMBALTA) 20 MG capsule Take 1 capsule (20 mg total) by mouth daily.  90 capsule  3  . escitalopram (LEXAPRO) 10 MG tablet TAKE 1 TABLET BY MOUTH EVERY DAY  90 tablet  3  . HYDROcodone-acetaminophen (NORCO/VICODIN) 5-325 MG per tablet Take 1 tablet by mouth 2 (two) times daily as needed for moderate pain.  60 tablet  0  . LORazepam (ATIVAN) 1 MG tablet Take 1 tablet (1 mg total) by mouth 2 (two) times daily as needed for anxiety.  60 tablet  3  . omega-3 acid ethyl esters (LOVAZA) 1 G capsule Take 2 capsules (2 g total) by mouth 2 (two) times daily.  360 capsule  1  . omeprazole (PRILOSEC) 40 MG capsule Take 1 capsule (40 mg total) by mouth daily.  30 capsule  5  . pregabalin (LYRICA) 75 MG capsule Take 1 capsule (75 mg total) by mouth 3 (three) times daily as needed.  90 capsule  2  . SUMAtriptan (IMITREX) 100 MG tablet Take 1 tablet (100 mg total) by mouth daily as needed.  10 tablet  11  . Iron Polysacch Cmplx-B12-FA 150-0.025-1 MG CAPS Take 1 capsule by mouth daily.  30 each  5   No facility-administered medications prior to visit.    Allergies  Allergen Reactions  . Citalopram Hydrobromide     REACTION: low libido    Family History  Problem Relation Age of Onset  . Dementia Mother   . Mental illness Mother     Alzheimer's  . Cancer Mother     kidney ca  . Heart disease Father   . Melanoma Other   . Lung cancer Other   . Lung cancer Other   . Lung cancer Other   . Alzheimer's disease Mother   . Migraines Mother   . Colon cancer Neg Hx   . Colon polyps Mother     History  Substance Use Topics  . Smoking status: Never Smoker   . Smokeless tobacco: Never Used  . Alcohol Use: 0.6 oz/week    1 Glasses of wine per week     Comment: Social    ROS: As per history of present illness,  otherwise negative  BP 126/74  Pulse 76  Ht 4' 9.75" (1.467 m)  Wt 141 lb (63.957 kg)  BMI 29.72 kg/m2  LMP 09/07/2014 Constitutional: Well-developed and well-nourished. No distress. HEENT: Normocephalic and atraumatic. Oropharynx is clear and moist. No oropharyngeal exudate. Conjunctivae are normal.  No scleral icterus. Neck: Neck supple. Trachea midline. Cardiovascular: Normal rate, regular rhythm and intact distal pulses. No M/R/G Pulmonary/chest: Effort normal and breath sounds normal. No wheezing, rales or rhonchi. Abdominal: Soft, nontender, nondistended. Bowel sounds active throughout. There are no masses palpable. No hepatosplenomegaly. Extremities: no clubbing, cyanosis, or edema Lymphadenopathy: No cervical adenopathy noted. Neurological: Alert and oriented to person place and time. Skin: Skin is warm and dry. No rashes noted. Psychiatric: Normal mood and affect. Behavior is normal.  RELEVANT LABS AND IMAGING: CBC    Component Value Date/Time   WBC 8.6 08/29/2014 1120   RBC 4.23 08/29/2014 1120   HGB 9.6* 08/29/2014 1120   HCT 30.5* 08/29/2014 1120   PLT 531.0* 08/29/2014 1120   MCV 72.2* 08/29/2014 1120   MCHC 31.6 08/29/2014 1120   RDW 18.5* 08/29/2014 1120   LYMPHSABS 2.3 08/29/2014 1120   MONOABS 0.8 08/29/2014 1120   EOSABS 0.2 08/29/2014 1120   BASOSABS 0.1 08/29/2014 1120    CMP     Component Value Date/Time   NA 138 08/29/2014 1120   K 3.6 08/29/2014 1120   CL 105 08/29/2014 1120   CO2 25 08/29/2014 1120   GLUCOSE 112* 08/29/2014 1120   GLUCOSE 108* 11/22/2006 0728   BUN 12 08/29/2014 1120   CREATININE 1.0 08/29/2014 1120   CALCIUM 9.8 08/29/2014 1120   PROT 7.4 07/08/2014 0825   ALBUMIN 3.5 07/08/2014 0825   AST 21 07/08/2014 0825   ALT 19 07/08/2014 0825   ALKPHOS 45 07/08/2014 0825   BILITOT 0.3 07/08/2014 0825   GFRNONAA 76.46 06/29/2010 1025   Iron/TIBC/Ferritin/ %Sat    Component Value Date/Time   IRON 20* 08/29/2014 1120   IRONPCTSAT 4.1*  08/29/2014 1120   ESR 72  TSH normal  ASSESSMENT/PLAN:  48 year old female with a past medical history of remote pituitary cyst status post craniotomy and later obstructive hydrocephalus requiring VP shunt, thalamic small vessel disease, hyperlipidemia, glucose intolerance, GERD, who is seen in consultation at the request of Dr. Alain Marion to evaluate iron deficiency anemia.  1. IDA -- she has developed an iron deficiency anemia for unclear reason. I recommended celiac panel today. I also recommended upper endoscopy and colonoscopy for further evaluation of  a source for chronic GI blood loss. She does continue to have her menstrual cycle although it is less frequently than previously. We discussed upper endoscopy and colonoscopy including the risks and benefits and she is agreeable to proceed. I recommended Integra 1 capsule daily for iron replacement. We also discussed the fibrin endoscopy and colonoscopy are negative, video capsule endoscopy would be the next recommended diagnostic tests.

## 2014-09-11 NOTE — Patient Instructions (Signed)
You have been scheduled for an endoscopy and colonoscopy. Please follow the written instructions given to you at your visit today. Please pick up your prep at the pharmacy within the next 1-3 days. If you use inhalers (even only as needed), please bring them with you on the day of your procedure. Your physician has requested that you go to www.startemmi.com and enter the access code given to you at your visit today. This web site gives a general overview about your procedure. However, you should still follow specific instructions given to you by our office regarding your preparation for the procedure. Your physician has requested that you go to the basement for  lab work before leaving today. We have sent  medications to your pharmacy for you to pick up at your convenience. CC:  Jacinta ShoeAleksei Plotnikov MD

## 2014-09-12 LAB — TISSUE TRANSGLUTAMINASE, IGA: Tissue Transglutaminase Ab, IgA: 5.2 U/mL (ref <20)

## 2014-09-15 ENCOUNTER — Encounter: Payer: Self-pay | Admitting: Internal Medicine

## 2014-09-18 ENCOUNTER — Telehealth: Payer: Self-pay | Admitting: Neurology

## 2014-09-30 ENCOUNTER — Ambulatory Visit: Payer: BC Managed Care – PPO | Attending: Neurology

## 2014-09-30 ENCOUNTER — Ambulatory Visit: Payer: BC Managed Care – PPO

## 2014-09-30 ENCOUNTER — Telehealth: Payer: Self-pay | Admitting: *Deleted

## 2014-09-30 DIAGNOSIS — Z8673 Personal history of transient ischemic attack (TIA), and cerebral infarction without residual deficits: Secondary | ICD-10-CM | POA: Insufficient documentation

## 2014-09-30 DIAGNOSIS — M5489 Other dorsalgia: Secondary | ICD-10-CM | POA: Diagnosis not present

## 2014-09-30 DIAGNOSIS — M542 Cervicalgia: Secondary | ICD-10-CM

## 2014-09-30 DIAGNOSIS — R531 Weakness: Secondary | ICD-10-CM | POA: Insufficient documentation

## 2014-09-30 NOTE — Telephone Encounter (Signed)
Pt called stating that she needed a later appt gave her an appt for 1:30pm...td

## 2014-09-30 NOTE — Therapy (Signed)
Physical Therapy Evaluation  Patient Details  Name: Samantha BondMichele S Mahar MRN: [id number] Date of Birth: [birth year]  Encounter Date: 09/30/2014      PT End of Session - 09/30/14 1745    Visit Number 1   Number of Visits 12   Date for PT Re-Evaluation 11/10/14   PT Start Time 1330   PT Stop Time 1415   PT Time Calculation (min) 45 min   Activity Tolerance Patient tolerated treatment well      Past Medical History  Diagnosis Date  . Migraine   . Hyperlipidemia   . Cerebral ventricular shunt fitting or adjustment   . Anxiety   . Depression   . COMMON MIGRAINE 10/01/2007    Chronic    . GLUCOSE INTOLERANCE 10/01/2007    Qualifier: Diagnosis of  By: Jonny RuizJohn MD, Len BlalockJames W   . HYPERLIPIDEMIA 10/01/2007    Chronic. Lovaza is too $$$ Declined statins due to worries   . OBSTRUCTIVE SLEEP APNEA 10/01/2007    NPSG 2006:  AHI 9/hr Started cpap 2009 successfully.     . Paresthesia     Past Surgical History  Procedure Laterality Date  . Cholecystectomy  1981  . Pituitary cyst w/subsequent shunt      LMP 09/07/2014  Visit Diagnosis:  Neck pain  Weakness      Subjective Assessment - 09/30/14 1340    Symptoms Pt c/o of generalized Bil UE and LE weakness that began following stroke in 03/2013. Pt also c/o of upper mid back pain and neck pain that was exacerbated over the last six months.    Pertinent History Shunt, CVA ( 03/2013). Pt is in the process of applying for long-term disability, social disability.    Limitations Standing;Walking;House hold activities   How long can you stand comfortably? 15 mins, limited by back pain, neck pain, and fatigue.    How long can you walk comfortably? 30 mins, limited by upper mid back pain, neck pain, and fatigue.    Diagnostic tests MRI indicated discal : C5-6:  discal and C7- T1: osteophyte complex.    Currently in Pain? Yes   Pain Score 6    Pain Location Neck   Pain Orientation Mid   Pain Descriptors / Indicators Aching   Pain Type Chronic  pain   Pain Onset More than a month ago   Pain Frequency Intermittent   Aggravating Factors  standing    Pain Relieving Factors supportive pillow to keep curvature in neck, pain medication    Multiple Pain Sites Yes   Pain Score 5   Pain Type Chronic pain   Pain Location Back   Pain Orientation Mid   Pain Descriptors / Indicators Aching   Pain Frequency Intermittent          [facility] PT Assessment - 09/30/14 1347    Assessment   Medical Diagnosis cervical disc bulge   Onset Date 03/30/14   Next MD Visit Dec 2015   Prior Therapy none   Precautions   Precautions Other (comment)   Precaution Comments Shunt R neck    Balance Screen   Has the patient fallen in the past 6 months No   Home Environment   Living Enviornment Private residence   Living Arrangements Spouse/significant other   Home Access Stairs to enter   Prior Function   Level of Independence Independent with basic ADLs   Posture/Postural Control   Posture/Postural Control Postural limitations   Postural Limitations Rounded Shoulders;Forward head   AROM  Cervical Flexion 35   Cervical Extension 25   Cervical - Right Side Bend 15   Cervical - Left Side Bend 15   Cervical - Right Rotation 52   Cervical - Left Rotation 55   Strength   Overall Strength Other (comment)   Overall Strength Comments Bil UE and LE strength 3+/5 throughout; however, pt presented with cogwheeling during testing.     Special Tests    Special Tests Cervical;Non-Organic Signs   Cervical Tests Spurling's;other   Non-Organic Signs Stimulated Rotation;Axial Loading;Over-reaction  Waddell's Non-organic signs positive.    Spurling's   Findings Positive   Comment Bil   other    Findings Positive   Comment Repeated movement Bil: Increased with Protrusion.    Stimulated Rotation   Comment Increased upper back pain   Axial Loading   Comments Increased neck and back pain   Over-reaction   Comments Pain provocation to unrelated movements or  unable to perform strength testing in one position, but able to in other positions. ROM measures differed between trials "stop when pain".            [facility] Adult PT Treatment/Exercise - 09/30/14 1347    Posture/Postural Control   Posture Comments Postural education for home and ADLs          PT Education - 09/30/14 1745    Education provided Yes   Education Details Postural education    Person(s) Educated Patient   Methods Explanation;Tactile cues;Demonstration   Comprehension Verbalized understanding;Returned demonstration          PT Short Term Goals - 09/30/14 1749    PT SHORT TERM GOAL #1   Title "Independent with initial HEP   Time 2   Period Weeks   Status New   PT SHORT TERM GOAL #2   Title "Demonstrate understanding of proper sitting posture, body mechanics, work ergonomics, and be more conscious of position and posture throughout the day.    Time 2   Period Weeks   Status New          PT Long Term Goals - 09/30/14 1750    PT LONG TERM GOAL #1   Title "Pt will be independent with advanced HEP.    Time 6   Period Weeks   Status New   PT LONG TERM GOAL #2   Title "Pt will tolerate standing for 30 mins and walking for 1 hour without increased pain in order to return to max level of function.     Time 6   Period Weeks   Status New   PT LONG TERM GOAL #3   Title "Pain will decrease to 2/10 with all functional activities   Time 6   Period Weeks   Status New   PT LONG TERM GOAL #4   Title Gross strength improves to 4-/5 for improved ability to perform ADLs.    Time 6   Period Weeks   Status New          Plan - 09/30/14 1527    Clinical Impression Statement Pt c/o of neck and upper mid-back pain. MRI indicates disc bulges: C5-6:  Focal medial foraminal disc protrusion with mass effect on the right C6 nerve root sheath.  No spinal or left foraminal  stenosis.  C6-7:  Mild annular bulge but no spinal or foraminal stenosis. C7-T1: Lateral foraminal disc  osteophyte complex on the right could potentially irritate R C8 nerve root. Pt also c/o of generalized weakness in Bil  UE and LEs;  Pt presented with cogwheeling during MMT in bilat UE and LEs; pt unable to flex knee with min resistance from PT, but able to perform supine bridge, and pt unable to perform elbow flexion with minimal resistance, but able to perform bicep curl with 5lbs weight. Pt presented with 3 positive Waddell's signs. Pt presents with neck and upper back pain  and generalized weakness. Pt would benefit from oupt PT for 2 times a week for 6 weeks to progress towrd maximal level of function.     Pt will benefit from skilled therapeutic intervention in order to improve on the following deficits Pain;Decreased endurance;Decreased strength;Difficulty walking;Impaired UE functional use;Decreased activity tolerance   Rehab Potential Good   PT Frequency 2x / week   PT Duration 6 weeks   PT Treatment/Interventions ADLs/Self Care Home Management;Therapeutic exercise;Patient/family education;Functional mobility training;Neuromuscular re-education;Manual techniques;Therapeutic activities   PT Next Visit Plan Est HEP. Generalized strengthening and stretches for neck with postural training.    PT Home Exercise Plan above        Problem List Patient Active Problem List   Diagnosis Date Noted  . LBP radiating to left leg 08/29/2014  . Upper back pain 07/29/2014  . Anemia 07/10/2014  . Well adult exam 07/07/2014  . GERD (gastroesophageal reflux disease) 10/14/2013  . Neck pain, chronic 08/08/2013  . Hydrocephalus 04/15/2013  . Paresthesia of left arm and leg 04/01/2013  . Abdominal  pain, other specified site 09/20/2012  . DEPRESSION 09/16/2010  . NAUSEA 09/16/2010  . Hypoglycemia 09/03/2010  . FATIGUE 06/29/2010  . LOW BACK PAIN, ACUTE 11/30/2009  . PARESTHESIA 11/30/2009  . CHEST PAIN 11/30/2009  . Cough 10/13/2009  . ANXIETY 11/19/2008  . GLUCOSE INTOLERANCE 10/01/2007  .  HYPERLIPIDEMIA 10/01/2007  . OBSTRUCTIVE SLEEP APNEA 10/01/2007  . COMMON MIGRAINE 10/01/2007  . ABSCESS, LEG 10/01/2007  . Presence of cerebrospinal fluid drainage device 10/01/2007                                              Haze Rushingenzi, Sherhonda Gaspar, PT 09/30/2014, 5:55 PM

## 2014-09-30 NOTE — Telephone Encounter (Signed)
APPTS MADE AND PRINTED...TD 

## 2014-10-01 ENCOUNTER — Telehealth: Payer: Self-pay | Admitting: Neurology

## 2014-10-01 NOTE — Telephone Encounter (Signed)
Patient is calling to see if Dr. Terrace ArabiaYan has received a questionnaire from [employer] regarding patient's disability. Please call and advise. Thank you.

## 2014-10-02 ENCOUNTER — Telehealth: Payer: Self-pay | Admitting: *Deleted

## 2014-10-02 NOTE — Telephone Encounter (Signed)
Samantha A[employer] will fax a form to medical records.

## 2014-10-02 NOTE — Telephone Encounter (Signed)
Patient called and asked did we have a form for her. Stated to patient I don't have a form but I will send her call to medical Records to check for her. Stated to patient 24-48 hour turn around time for call back. Patient understood process.

## 2014-10-07 ENCOUNTER — Ambulatory Visit: Payer: BC Managed Care – PPO | Admitting: Internal Medicine

## 2014-10-14 ENCOUNTER — Ambulatory Visit: Payer: BC Managed Care – PPO | Attending: Neurology | Admitting: Physical Therapy

## 2014-10-14 DIAGNOSIS — R531 Weakness: Secondary | ICD-10-CM

## 2014-10-14 DIAGNOSIS — Z8673 Personal history of transient ischemic attack (TIA), and cerebral infarction without residual deficits: Secondary | ICD-10-CM | POA: Diagnosis not present

## 2014-10-14 DIAGNOSIS — M5489 Other dorsalgia: Secondary | ICD-10-CM | POA: Insufficient documentation

## 2014-10-14 DIAGNOSIS — M542 Cervicalgia: Secondary | ICD-10-CM | POA: Diagnosis not present

## 2014-10-14 NOTE — Therapy (Signed)
Physical Therapy Treatment  Patient Details  Name: Samantha [name] MRN: [id number] Date of Birth: [birth year]  Encounter Date: 10/14/2014      PT End of Session - 10/14/14 1058    Visit Number 2   Number of Visits 12   Date for PT Re-Evaluation 11/10/14   PT Start Time 1022   PT Stop Time 1102   PT Time Calculation (min) 40 min      Past Medical History  Diagnosis Date  . Migraine   . Hyperlipidemia   . Cerebral ventricular shunt fitting or adjustment   . Anxiety   . Depression   . COMMON MIGRAINE 10/01/2007    Chronic    . GLUCOSE INTOLERANCE 10/01/2007    Qualifier: Diagnosis of  By: Jonny RuizJohn MD, Len BlalockJames W   . HYPERLIPIDEMIA 10/01/2007    Chronic. Lovaza is too $$$ Declined statins due to worries   . OBSTRUCTIVE SLEEP APNEA 10/01/2007    NPSG 2006:  AHI 9/hr Started cpap 2009 successfully.     . Paresthesia     Past Surgical History  Procedure Laterality Date  . Cholecystectomy  1981  . Pituitary cyst w/subsequent shunt      There were no vitals taken for this visit.  Visit Diagnosis:  Neck pain  Weakness      Subjective Assessment - 10/14/14 1025    Symptoms 5/10 neck pain. Hot laying down helps.  Sit 15 -20 minutes makes it worse.  Tries to sit with good posture and move her Lt foot pumps.   Currently in Pain? Yes   Pain Score 5    Pain Location --  Hot embers   Pain Orientation Mid   Pain Type Chronic pain   Pain Radiating Towards Shoulders, also Lt hand numb.  intermittantly   Pain Onset More than a month ago   Aggravating Factors  Sitting too long   Pain Relieving Factors Layin or reclining   Pain Score 7   Pain Type Chronic pain   Pain Location Knee   Pain Orientation Left   Pain Radiating Towards toes   Pain Descriptors / Indicators Numbness   Pain Frequency Intermittent            [facility] Adult PT Treatment/Exercise - 10/14/14 1031    Posture/Postural Control   Posture/Postural Control --  working on good posture   Neck Exercises:  Stretches   Other Neck Stretches --  4 way stretches added to home exercise program 3 reps 30 sec   Neck Exercises: Supine   Neck Retraction 5 reps;5 secs   Other Supine Exercise Added Neck supine 2 exercises to HEP 5 reps,  2 sets          PT Education - 10/14/14 1058    Education provided Yes   Person(s) Educated Patient   Methods Explanation;Handout   Comprehension Verbalized understanding;Returned demonstration              Plan - 10/14/14 1059    Clinical Impression Statement Able to make progress toward home exercise goal. Patient cooperative.   PT Next Visit Plan Review Neck Rom and Neck stabilization2 exercises.  Doorway stretch. rows        Problem List Patient Active Problem List   Diagnosis Date Noted  . LBP radiating to left leg 08/29/2014  . Upper back pain 07/29/2014  . Anemia 07/10/2014  . Well adult exam 07/07/2014  . GERD (gastroesophageal reflux disease) 10/14/2013  . Neck pain, chronic 08/08/2013  .  Hydrocephalus 04/15/2013  . Paresthesia of left arm and leg 04/01/2013  . Abdominal  pain, other specified site 09/20/2012  . DEPRESSION 09/16/2010  . NAUSEA 09/16/2010  . Hypoglycemia 09/03/2010  . FATIGUE 06/29/2010  . LOW BACK PAIN, ACUTE 11/30/2009  . PARESTHESIA 11/30/2009  . CHEST PAIN 11/30/2009  . Cough 10/13/2009  . ANXIETY 11/19/2008  . GLUCOSE INTOLERANCE 10/01/2007  . HYPERLIPIDEMIA 10/01/2007  . OBSTRUCTIVE SLEEP APNEA 10/01/2007  . COMMON MIGRAINE 10/01/2007  . ABSCESS, LEG 10/01/2007  . Presence of cerebrospinal fluid drainage device 10/01/2007                                              Kae HellerHARRIS,Apoorva Bugay Rogelio Waynick, PTA 10/14/2014 11:02 AM Phone: [phone] Fax: [phone]  10/14/2014, 11:02 AM

## 2014-10-14 NOTE — Patient Instructions (Signed)
Tips for Range of Motion Set aside same time of day for exercise, so it becomes part of the daily routine. All hand holds should be gentle. Move slowly, cautiously and with good control. Once resistance is felt, push no further. Quality of stretch is more important than quantity. Be consistent with program to ensure results. If infant fusses, stop and calm child before continuing exercise. Try to make exercise fun - talk, play music or sing to child.  Copyright  [employer]. All rights reserved.

## 2014-10-15 ENCOUNTER — Telehealth: Payer: Self-pay | Admitting: *Deleted

## 2014-10-15 ENCOUNTER — Ambulatory Visit: Payer: BC Managed Care – PPO | Admitting: Nurse Practitioner

## 2014-10-15 DIAGNOSIS — Z0289 Encounter for other administrative examinations: Secondary | ICD-10-CM

## 2014-10-15 NOTE — Telephone Encounter (Signed)
[employer] form on [employer] desk.

## 2014-10-16 ENCOUNTER — Ambulatory Visit: Payer: BC Managed Care – PPO | Admitting: Physical Therapy

## 2014-10-16 ENCOUNTER — Ambulatory Visit: Payer: BC Managed Care – PPO | Admitting: Nurse Practitioner

## 2014-10-16 DIAGNOSIS — R531 Weakness: Secondary | ICD-10-CM

## 2014-10-16 DIAGNOSIS — M542 Cervicalgia: Secondary | ICD-10-CM | POA: Diagnosis not present

## 2014-10-16 NOTE — Patient Instructions (Signed)
Issued and reviewed posture and body mechanics training for vacum,  laundry tasks.  Other ADL techniques discussed and demonstrated by PTA.

## 2014-10-16 NOTE — Therapy (Signed)
[facility] [address] [place], [place], [address] Phone: [phone]   Fax:  [phone]  Physical Therapy Treatment  Patient Details  Name: Samantha BondMichele S [name] MRN: [id number] Date of Birth: 09-02-1966  Encounter Date: 10/16/2014      PT End of Session - 10/16/14 1348    Activity Tolerance Patient tolerated treatment well      Past Medical History  Diagnosis Date  . Migraine   . Hyperlipidemia   . Cerebral ventricular shunt fitting or adjustment   . Anxiety   . Depression   . COMMON MIGRAINE 10/01/2007    Chronic    . GLUCOSE INTOLERANCE 10/01/2007    Qualifier: Diagnosis of  By: Jonny RuizJohn MD, Len BlalockJames W   . HYPERLIPIDEMIA 10/01/2007    Chronic. Lovaza is too $$$ Declined statins due to worries   . OBSTRUCTIVE SLEEP APNEA 10/01/2007    NPSG 2006:  AHI 9/hr Started cpap 2009 successfully.     . Paresthesia     Past Surgical History  Procedure Laterality Date  . Cholecystectomy  1981  . Pituitary cyst w/subsequent shunt      There were no vitals taken for this visit.  Visit Diagnosis:  Neck pain  Weakness      Subjective Assessment - 10/16/14 1022    Symptoms 4/10.  Just got up ,  wors as day goes on.  Doing exercises and posture awareness.  Heat helps with stretches.            [facility] Adult PT Treatment/Exercise - 10/16/14 1015    Posture/Postural Control   Posture/Postural Control --  improving   Exercises   Exercises Neck   Neck Exercises: Machines for Strengthening   Other Machines for Strengthening --  Rows, 3 LBs. 10 2 sets , cued for posture   Neck Exercises: Theraband   Scapula Retraction 10 reps  no band , supine   Horizontal ADduction Limitations  10  yellow, band   Horizontal ABduction 10 reps  added to home exercise program   Other Theraband Exercises --  Diagonals, 10 reps each way, supine, yellow band added to ho   Neck Exercises: Standing   Neck Retraction 5 reps;3 secs  good technique   Other  Standing Exercises doorway stretch, 3, 30      Patient also instructed in posture and body mechanics for ADL's. Laundry tasks rewiewed, floor cleaning techniques demonstrated.  Squats and modified technique options demonstrated for patient.  She was receptive and attentive during instruction, asking good questions.  She already uses some good mechanics at home.     PT Education - 10/16/14 1102    Education provided Yes   Person(s) Educated Patient   Methods Explanation;Demonstration;Handout   Comprehension Verbalized understanding;Returned demonstration          PT Short Term Goals - 10/16/14 1023    PT SHORT TERM GOAL #1   Title "Independent with initial HEP   Time 2   Period Weeks   Status Achieved   PT SHORT TERM GOAL #2   Title "Demonstrate understanding of proper sitting posture, body mechanics, work ergonomics, and be more conscious of position and posture throughout the day.    Time 2   Period Weeks   Status Achieved            Plan - 10/16/14 1104    Clinical Impression Statement Continue to make progress toward home exercise goals.  Patient adherent with home exercises.  Pain controlled with medication, good  posture and exercises and rest.                               Problem List Patient Active Problem List   Diagnosis Date Noted  . LBP radiating to left leg 08/29/2014  . Upper back pain 07/29/2014  . Anemia 07/10/2014  . Well adult exam 07/07/2014  . GERD (gastroesophageal reflux disease) 10/14/2013  . Neck pain, chronic 08/08/2013  . Hydrocephalus 04/15/2013  . Paresthesia of left arm and leg 04/01/2013  . Abdominal  pain, other specified site 09/20/2012  . DEPRESSION 09/16/2010  . NAUSEA 09/16/2010  . Hypoglycemia 09/03/2010  . FATIGUE 06/29/2010  . LOW BACK PAIN, ACUTE 11/30/2009  . PARESTHESIA 11/30/2009  . CHEST PAIN 11/30/2009  . Cough 10/13/2009  . ANXIETY 11/19/2008  . GLUCOSE INTOLERANCE 10/01/2007  .  HYPERLIPIDEMIA 10/01/2007  . OBSTRUCTIVE SLEEP APNEA 10/01/2007  . COMMON MIGRAINE 10/01/2007  . ABSCESS, LEG 10/01/2007  . Presence of cerebrospinal fluid drainage device 10/01/2007   Liz BeachKaren Harris, PTA 10/16/2014 1:50 PM Phone: [phone] Fax: [phone]  HARRIS,KAREN 10/16/2014, 1:50 PM

## 2014-10-21 ENCOUNTER — Telehealth: Payer: Self-pay | Admitting: *Deleted

## 2014-10-21 ENCOUNTER — Ambulatory Visit: Payer: BC Managed Care – PPO | Admitting: Physical Therapy

## 2014-10-21 DIAGNOSIS — M542 Cervicalgia: Secondary | ICD-10-CM

## 2014-10-21 DIAGNOSIS — R531 Weakness: Secondary | ICD-10-CM

## 2014-10-21 NOTE — Therapy (Signed)
[facility] [address] [place], [place], [address] Phone: [phone]   Fax:  [phone]  Physical Therapy Treatment  Patient Details  Name: Samantha [name] MRN: [id number] Date of Birth: 1966-10-03  Encounter Date: 10/21/2014      PT End of Session - 10/21/14 1059    Visit Number 4   Number of Visits 12   Date for PT Re-Evaluation 11/10/14   PT Start Time 1018   PT Stop Time 1100   PT Time Calculation (min) 42 min   Activity Tolerance Patient tolerated treatment well      Past Medical History  Diagnosis Date  . Migraine   . Hyperlipidemia   . Cerebral ventricular shunt fitting or adjustment   . Anxiety   . Depression   . COMMON MIGRAINE 10/01/2007    Chronic    . GLUCOSE INTOLERANCE 10/01/2007    Qualifier: Diagnosis of  By: Jonny RuizJohn MD, Len BlalockJames W   . HYPERLIPIDEMIA 10/01/2007    Chronic. Lovaza is too $$$ Declined statins due to worries   . OBSTRUCTIVE SLEEP APNEA 10/01/2007    NPSG 2006:  AHI 9/hr Started cpap 2009 successfully.     . Paresthesia     Past Surgical History  Procedure Laterality Date  . Cholecystectomy  1981  . Pituitary cyst w/subsequent shunt      There were no vitals taken for this visit.  Visit Diagnosis:  Neck pain  Weakness      Subjective Assessment - 10/21/14 1047    Symptoms Stretches in shower helpful, band helpful.  Wants to walk more and do more things around house.  Feels lethargic with walking 20 minutes.  Needed ibuprophen after last session.            [facility] Adult PT Treatment/Exercise - 10/21/14 1029    Neck Exercises: Stretches   Other Neck Stretches --  30 Rt 25 degrees Left. , observed in clinic with more motion   Other Neck Stretches --  25 rt, 10 degrees Lt Lateral flexion neck.   Neck Exercises: Machines for Strengthening   Other Machines for Strengthening --  nustep level 4, 6 minutes.   Neck Exercises: Theraband   Shoulder ADduction Limitations 4/5 mmt both   Shoulder External Rotation Limitations 4/5 mmy both   Shoulder Internal Rotation Limitations 4/5 Rt, 4-/5 Rt   Horizontal ADduction Limitations 4/5 Rt, 4-/5 Lt   Horizontal ABduction Limitations 4/5 Rt, 4-/5 Lt mmy   Neck Exercises: Standing   Neck Retraction 5 reps  good technique   Neck Exercises: Seated   Shoulder Flexion 10 reps   Shoulder ABduction 10 reps   Other Seated Exercise --  elbow ROM flexion/ extension, supination, pronation 10 reps    Neck Exercises: Supine   Capital Flexion Limitations full   Cervical Rotation 5 reps;Left;Right   decreased to the left   Lateral Flexion 5 reps  limited by pain              PT Long Term Goals - 10/21/14 1650    PT LONG TERM GOAL #1   Title "Pt will be independent with advanced HEP.    Time 6   Period Weeks   Status On-going   PT LONG TERM GOAL #2   Title "Pt will tolerate standing for 30 mins and walking for 1 hour without increased pain in order to return to max level of function.     Time 6   Period Weeks   Status  On-going   PT LONG TERM GOAL #3   Title "Pain will decrease to 2/10 with all functional activities   Time 6   Status On-going   PT LONG TERM GOAL #4   Title Gross strength improves to 4-/5 for improved ability to perform ADLs.    Time 6   Status Achieved          Plan - 10/21/14 1100    Clinical Impression Statement Some progress toward goals, see goal section, New elbow pain last few days from Laundry? per patient , Reports pain less intense with Exercises.   PT Next Visit Plan Review Neck stabilization exercises.                               Problem List Patient Active Problem List   Diagnosis Date Noted  . LBP radiating to left leg 08/29/2014  . Upper back pain 07/29/2014  . Anemia 07/10/2014  . Well adult exam 07/07/2014  . GERD (gastroesophageal reflux disease) 10/14/2013  . Neck pain, chronic 08/08/2013  . Hydrocephalus 04/15/2013  . Paresthesia of left arm  and leg 04/01/2013  . Abdominal  pain, other specified site 09/20/2012  . DEPRESSION 09/16/2010  . NAUSEA 09/16/2010  . Hypoglycemia 09/03/2010  . FATIGUE 06/29/2010  . LOW BACK PAIN, ACUTE 11/30/2009  . PARESTHESIA 11/30/2009  . CHEST PAIN 11/30/2009  . Cough 10/13/2009  . ANXIETY 11/19/2008  . GLUCOSE INTOLERANCE 10/01/2007  . HYPERLIPIDEMIA 10/01/2007  . OBSTRUCTIVE SLEEP APNEA 10/01/2007  . COMMON MIGRAINE 10/01/2007  . ABSCESS, LEG 10/01/2007  . Presence of cerebrospinal fluid drainage device 10/01/2007  Liz BeachKaren Harris, PTA 10/21/2014 4:53 PM Phone: [phone] Fax: [phone]   HARRIS,KAREN 10/21/2014, 4:53 PM

## 2014-10-21 NOTE — Telephone Encounter (Signed)
Form faxed to [employer] on 12/22/13.

## 2014-10-21 NOTE — Telephone Encounter (Signed)
Form sent to the scan center.

## 2014-10-22 ENCOUNTER — Ambulatory Visit (INDEPENDENT_AMBULATORY_CARE_PROVIDER_SITE_OTHER): Payer: BC Managed Care – PPO | Admitting: Internal Medicine

## 2014-10-22 ENCOUNTER — Encounter: Payer: Self-pay | Admitting: Internal Medicine

## 2014-10-22 VITALS — BP 160/90 | HR 96 | Temp 98.7°F | Ht <= 58 in | Wt 140.0 lb

## 2014-10-22 DIAGNOSIS — M79605 Pain in left leg: Secondary | ICD-10-CM

## 2014-10-22 DIAGNOSIS — M545 Low back pain, unspecified: Secondary | ICD-10-CM

## 2014-10-22 DIAGNOSIS — M549 Dorsalgia, unspecified: Secondary | ICD-10-CM

## 2014-10-22 DIAGNOSIS — R202 Paresthesia of skin: Secondary | ICD-10-CM

## 2014-10-22 DIAGNOSIS — M546 Pain in thoracic spine: Secondary | ICD-10-CM

## 2014-10-22 MED ORDER — HYDROCODONE-ACETAMINOPHEN 5-325 MG PO TABS: 1.0000 | ORAL_TABLET | Freq: Two times a day (BID) | ORAL | Status: DC | PRN

## 2014-10-22 NOTE — Assessment & Plan Note (Signed)
Potential benefits of a long term opioids use as well as potential risks (i.e. addiction risk, apnea etc) and complications (i.e. Somnolence, constipation and others) were explained to the patient and were aknowledged. F/u w/Dr Terrace ArabiaYan

## 2014-10-22 NOTE — Progress Notes (Signed)
Subjective:    HPI     F/u L side - (back, leg, arm) hurts a lot - tramadol not working; pain is 5/10 on Norco only  F/u GERD. C/o still being depressed, tired She was able to get Lyrica, Cymbalta - PA.  F/u worsening lower neck pain, worse on the left, with pain to left upper back/shoulder and numbness to left arm extending distally, similar in area that seemed to start months ago in bed when she stretched and reached for an object on the bedside table and felt a pop and pain lasting several days and seemed better until the return worse than ever now mod to severe;  No LUE weakness or loss of grip strength.  Also mentions similar left lower back pain with LLE numbness as well but much less severe pain, mild only, with LLE numbness but no weakness.  No HA and Pt denies new neurological symptoms such as new headache, or facial or extremity weakness as above.  No prior hx of CVA. C/o worsening depressive symptoms, suicidal ideation, or panic; has ongoing anxiety.  H/o MS   Dr Krista Blue: sx's suggestive of a right internal capsule thalamus small vessel disease, which can be missed by MRI scan, MRI of the brain showed stable chronic changes detailed above, there was no acute lesions  She had a sleep study last night   Wt Readings from Last 3 Encounters:  10/22/14 140 lb (63.504 kg)  09/11/14 141 lb (63.957 kg)  08/29/14 141 lb (63.957 kg)   BP Readings from Last 3 Encounters:  10/22/14 160/90  09/11/14 126/74  08/29/14 130/90      Past Medical History  Diagnosis Date  . Migraine   . Hyperlipidemia   . Cerebral ventricular shunt fitting or adjustment   . Anxiety   . Depression   . COMMON MIGRAINE 10/01/2007    Chronic    . GLUCOSE INTOLERANCE 10/01/2007    Qualifier: Diagnosis of  By: Jenny Reichmann MD, Hunt Oris   . HYPERLIPIDEMIA 10/01/2007    Chronic. Lovaza is too $$$ Declined statins due to worries   . OBSTRUCTIVE SLEEP APNEA 10/01/2007    NPSG 2006:  AHI 9/hr Started cpap 2009  successfully.     . Paresthesia    Past Surgical History  Procedure Laterality Date  . Cholecystectomy  1981  . Pituitary cyst w/subsequent shunt      reports that she has never smoked. She has never used smokeless tobacco. She reports that she drinks about 0.6 oz of alcohol per week. She reports that she does not use illicit drugs. family history includes Alzheimer's disease in her mother; Cancer in her mother; Colon polyps in her mother; Dementia in her mother; Heart disease in her father; Lung cancer in her other, other, and other; Melanoma in her other; Mental illness in her mother; Migraines in her mother. There is no history of Colon cancer. Allergies  Allergen Reactions  . Citalopram Hydrobromide     REACTION: low libido   Current Outpatient Prescriptions on File Prior to Visit  Medication Sig Dispense Refill  . aspirin (BAYER ASPIRIN) 325 MG tablet Take 1 tablet (325 mg total) by mouth daily. 100 tablet 3  . atorvastatin (LIPITOR) 20 MG tablet TAKE 1 TABLET (20 MG TOTAL) BY MOUTH DAILY. 90 tablet 3  . Calcium Carbonate-Vitamin D (CALCIUM-VITAMIN D) 500-200 MG-UNIT per tablet Take 1 tablet by mouth 2 (two) times daily with a meal.    . DULoxetine (CYMBALTA) 20 MG capsule  Take 1 capsule (20 mg total) by mouth daily. 90 capsule 3  . escitalopram (LEXAPRO) 10 MG tablet TAKE 1 TABLET BY MOUTH EVERY DAY 90 tablet 3  . FeFum-FePoly-FA-B Cmp-C-Biot (INTEGRA PLUS) CAPS Take 1 capsule by mouth daily. 30 capsule 6  . HYDROcodone-acetaminophen (NORCO/VICODIN) 5-325 MG per tablet Take 1 tablet by mouth 2 (two) times daily as needed for moderate pain. 60 tablet 0  . LORazepam (ATIVAN) 1 MG tablet Take 1 tablet (1 mg total) by mouth 2 (two) times daily as needed for anxiety. 60 tablet 3  . MOVIPREP 100 G SOLR Take 1 kit (200 g total) by mouth once. 1 kit 0  . omega-3 acid ethyl esters (LOVAZA) 1 G capsule Take 2 capsules (2 g total) by mouth 2 (two) times daily. 360 capsule 1  . omeprazole  (PRILOSEC) 40 MG capsule Take 1 capsule (40 mg total) by mouth daily. 30 capsule 5  . pregabalin (LYRICA) 75 MG capsule Take 1 capsule (75 mg total) by mouth 3 (three) times daily as needed. 90 capsule 2  . SUMAtriptan (IMITREX) 100 MG tablet Take 1 tablet (100 mg total) by mouth daily as needed. 10 tablet 11   No current facility-administered medications on file prior to visit.   Review of Systems  Musculoskeletal: Positive for back pain, neck pain and neck stiffness.  Psychiatric/Behavioral: Positive for dysphoric mood. Negative for suicidal ideas. The patient is nervous/anxious.     Constitutional: Negative for unexpected weight change, or unusual diaphoresis  HENT: Negative for tinnitus.   Eyes: Negative for photophobia and visual disturbance.  Respiratory: Negative for choking and stridor.   Gastrointestinal: Negative for vomiting and blood in stool.  Genitourinary: Negative for hematuria and decreased urine volume.  Musculoskeletal: Negative for acute joint swelling Skin: Negative for color change and wound.  Neurological: Negative for tremors and numbness other than noted  Neck is tender Psychiatric/Behavioral: Negative for decreased concentration or  hyperactivity.  Depressed     Objective:   Physical Exam BP 160/90 mmHg  Pulse 96  Temp(Src) 98.7 F (37.1 C) (Oral)  Ht 4' 9"  (1.448 m)  Wt 140 lb (63.504 kg)  BMI 30.29 kg/m2  SpO2 97% VS noted,  Constitutional: Pt appears well-developed and well-nourished.  HENT: Head: NCAT.  Right Ear: External ear normal.  Left Ear: External ear normal.  Eyes: Conjunctivae and EOM are normal. Pupils are equal, round, and reactive to light.  Neck: Normal range of motion. Neck supple.  Cardiovascular: Normal rate and regular rhythm.   Pulmonary/Chest: Effort normal and breath sounds normal.  Abd:  Soft, NT, non-distended, + BS Neurological: Pt is alert. Not confused , cn 2-12 intact, motor 5/5, with decr sens to LT to distal LUE, as  well as LLE anterior thigh and lateral lower leg Spine tender to mid low c-spine only without red/tenderswelling Has some mild left lumbar paravertebral tender, no red/swelling Tenderness noted in neck, left trapezoid area as well, left shoulder NT, FROM Skin: Skin is warm. No erythema. No rash Psychiatric: Pt behavior is normal. Thought content normal. not nervous,no obvious depressed affect  Lab Results  Component Value Date   WBC 8.6 08/29/2014   HGB 9.6* 08/29/2014   HCT 30.5* 08/29/2014   PLT 531.0* 08/29/2014   GLUCOSE 112* 08/29/2014   CHOL 250* 07/08/2014   TRIG 115.0 07/08/2014   HDL 42.50 07/08/2014   LDLDIRECT 207.7 05/02/2011   LDLCALC 185* 07/08/2014   ALT 19 07/08/2014   AST 21 07/08/2014  NA 138 08/29/2014   K 3.6 08/29/2014   CL 105 08/29/2014   CREATININE 1.0 08/29/2014   BUN 12 08/29/2014   CO2 25 08/29/2014   TSH 4.05 07/08/2014   HGBA1C 5.8 08/29/2014       Assessment & Plan:

## 2014-10-22 NOTE — Assessment & Plan Note (Signed)
Continue with current prescription therapy as reflected on the Med list.  Potential benefits of a long term opioids use as well as potential risks (i.e. addiction risk, apnea etc) and complications (i.e. Somnolence, constipation and others) were explained to the patient and were aknowledged.  

## 2014-10-22 NOTE — Assessment & Plan Note (Signed)
Potential benefits of a long term opioids use as well as potential risks (i.e. addiction risk, apnea etc) and complications (i.e. Somnolence, constipation and others) were explained to the patient and were aknowledged.  Continue with current prescription therapy as reflected on the Med list.  

## 2014-10-23 ENCOUNTER — Ambulatory Visit: Payer: BC Managed Care – PPO | Admitting: Physical Therapy

## 2014-10-25 ENCOUNTER — Other Ambulatory Visit: Payer: Self-pay | Admitting: Internal Medicine

## 2014-10-29 ENCOUNTER — Ambulatory Visit (AMBULATORY_SURGERY_CENTER): Payer: BC Managed Care – PPO | Admitting: Internal Medicine

## 2014-10-29 ENCOUNTER — Encounter: Payer: Self-pay | Admitting: Internal Medicine

## 2014-10-29 ENCOUNTER — Encounter: Payer: BC Managed Care – PPO | Admitting: Internal Medicine

## 2014-10-29 VITALS — BP 149/80 | HR 64 | Temp 98.7°F | Resp 17 | Ht <= 58 in | Wt 140.0 lb

## 2014-10-29 DIAGNOSIS — K9289 Other specified diseases of the digestive system: Secondary | ICD-10-CM

## 2014-10-29 DIAGNOSIS — D509 Iron deficiency anemia, unspecified: Secondary | ICD-10-CM

## 2014-10-29 DIAGNOSIS — Z1211 Encounter for screening for malignant neoplasm of colon: Secondary | ICD-10-CM

## 2014-10-29 MED ORDER — OMEPRAZOLE 40 MG PO CPDR
40.0000 mg | DELAYED_RELEASE_CAPSULE | Freq: Every day | ORAL | Status: DC
Start: 2014-10-29 — End: 2015-01-09

## 2014-10-29 MED ORDER — SODIUM CHLORIDE 0.9 % IV SOLN: 500.0000 mL | INTRAVENOUS | Status: DC

## 2014-10-29 NOTE — Progress Notes (Signed)
Patient drank 4 oz of tea and a sip of water on arrival to unit. Discussed with Dr. Rhea BeltonPyrtle and Iline OvenLynn Beeson CRNA by Milford CageBarbara Smith NCMA. Both agreed to go forward with procedures.(upper endoscopy and colonoscopy)

## 2014-10-29 NOTE — Progress Notes (Signed)
Called to room to assist during endoscopic procedure.  Patient ID and intended procedure confirmed with present staff. Received instructions for my participation in the procedure from the performing physician.  

## 2014-10-29 NOTE — Op Note (Signed)
[facility] [address] [place], [id number]   COLONOSCOPY PROCEDURE REPORT  PATIENT: Samantha [name], [name]  MR#: [id number] BIRTHDATE: [birth year] , 48  yrs. old GENDER: female ENDOSCOPIST: Beverley FiedlerJay M Pyrtle, MD REFERRED WN:UUVOBY:Alex Buckner MaltaV Plotnikov, M.D. PROCEDURE DATE:  10/29/2014 PROCEDURE:   Colonoscopy, screening First Screening Colonoscopy - Avg.  risk and is 50 yrs.  old or older Yes.  Prior Negative Screening - Now for repeat screening. N/A  History of Adenoma - Now for follow-up colonoscopy & has been > or = to 3 yrs.  N/A  Polyps Removed Today? No.  Polyps Removed Today? No.  Recommend repeat exam, <10 yrs? Polyps Removed Today? No.  Recommend repeat exam, <10 yrs? No. ASA CLASS:   Class III INDICATIONS:average risk for colon cancer, first colonoscopy, and iron deficiency anemia. MEDICATIONS: Monitored anesthesia care, Propofol 250 mg IV, and Residual sedation present  DESCRIPTION OF PROCEDURE:   After the risks benefits and alternatives of the procedure were thoroughly explained, informed consent was obtained.  The digital rectal exam revealed no abnormalities of the rectum.   The LB [id number]  endoscope was introduced through the anus and advanced to the cecum, which was identified by both the appendix and ileocecal valve. No adverse events experienced.   The quality of the prep was good, using MoviPrep  The instrument was then slowly withdrawn as the colon was fully examined.    COLON FINDINGS: A normal appearing cecum, ileocecal valve, and appendiceal orifice were identified.  The ascending, transverse, descending, sigmoid colon, and rectum appeared unremarkable. Retroflexed views revealed no abnormalities. The time to cecum=3 minutes 57 seconds.  Withdrawal time=8 minutes 03 seconds.  The scope was withdrawn and the procedure completed.  COMPLICATIONS: There were no immediate complications.  ENDOSCOPIC IMPRESSION: Normal  colonoscopy  RECOMMENDATIONS: 1.  You should continue to follow colorectal cancer screening guidelines for "routine risk" patients with a repeat colonoscopy in 10 years.  There is no need for FOBT (stool) testing for at least 5 years. 2.  Repeat Hgb and iron studies, then office follow-up  eSigned:  Beverley FiedlerJay M Pyrtle, MD 10/29/2014 3:53 PM   cc: Linda HedgesAlex V.  Plotnikov, MD and The Patient

## 2014-10-29 NOTE — Op Note (Signed)
[facility] [address] [place], [id number]   ENDOSCOPY PROCEDURE REPORT  PATIENT: [name], Samantha S  MR#: [id number] BIRTHDATE: [birth year] , 48  yrs. old GENDER: female ENDOSCOPIST: Beverley FiedlerJay M Rain [place], MD REFERRED BY:  Janeal HolmesAlex V Plotnikov, M.D. PROCEDURE DATE:  10/29/2014 PROCEDURE:  EGD w/ biopsy ASA CLASS:     Class III INDICATIONS:  iron deficiency anemia. MEDICATIONS: Monitored anesthesia care and Propofol 150 mg IV TOPICAL ANESTHETIC: none  DESCRIPTION OF PROCEDURE: After the risks benefits and alternatives of the procedure were thoroughly explained, informed consent was obtained.  The LB [id number] endoscope was introduced through the mouth and advanced to the second portion of the duodenum , Without limitations.  The instrument was slowly withdrawn as the mucosa was fully examined.   ESOPHAGUS: A non-obstructing Schatzki ring was found with mild esophagitis at the GEJ (30 cm from the incisors).  Otherwise normal esophagus.  STOMACH: A 4 cm hiatal hernia was noted.   Several non-bleeding cameron's erosions were found in the cardia near the diaphragmatic hiatus.   The stomach otherwise appeared normal.  DUODENUM: The duodenal mucosa showed no abnormalities in the bulb and 2nd part of the duodenum.  Cold forcep biopsies were taken in the second portion.  Retroflexed views revealed a hiatal hernia.     The scope was then withdrawn from the patient and the procedure completed.  COMPLICATIONS: There were no immediate complications.  ENDOSCOPIC IMPRESSION: 1.   Schatzki ring was found with mild esophagitis at the GE junction, 30 cm from the incisors 2.   4 cm hiatal hernia 3.   Multiple cameron's erosions were found in the cardia 4.   The stomach otherwise appeared normal 5.   The duodenal mucosa showed no abnormalities in the bulb and 2nd part of the duodenum cold forcep biopsies were taken in the second portion  RECOMMENDATIONS: 1.  Await biopsy  results 2.  Resume omeprazole 40 mg daily, continue iron replacement 3.  Monitor Hgb and iron studies   eSigned:  Beverley FiedlerJay M Zackory Pudlo, MD 10/29/2014 3:50 PM    MV:HQIOCC:Alex V.  Plotnikov, MD and The Patient  PATIENT NAME:  [name], Samantha S MR#: [id number]

## 2014-10-29 NOTE — Patient Instructions (Signed)
Normal colonoscopy. Repeat colonoscopy in 10 years. Biopsies taken today. Resume current medications. Result letter in your mail in 1-2 weeks.  YOU HAD AN ENDOSCOPIC PROCEDURE TODAY AT THE [facility]: Refer to the procedure report that was given to you for any specific questions about what was found during the examination.  If the procedure report does not answer your questions, please call your gastroenterologist to clarify.  If you requested that your care partner not be given the details of your procedure findings, then the procedure report has been included in a sealed envelope for you to review at your convenience later.  YOU SHOULD EXPECT: Some feelings of bloating in the abdomen. Passage of more gas than usual.  Walking can help get rid of the air that was put into your GI tract during the procedure and reduce the bloating. If you had a lower endoscopy (such as a colonoscopy or flexible sigmoidoscopy) you may notice spotting of blood in your stool or on the toilet paper. If you underwent a bowel prep for your procedure, then you may not have a normal bowel movement for a few days.  DIET: Your first meal following the procedure should be a light meal and then it is ok to progress to your normal diet.  A half-sandwich or bowl of soup is an example of a good first meal.  Heavy or fried foods are harder to digest and may make you feel nauseous or bloated.  Likewise meals heavy in dairy and vegetables can cause extra gas to form and this can also increase the bloating.  Drink plenty of fluids but you should avoid alcoholic beverages for 24 hours.  ACTIVITY: Your care partner should take you home directly after the procedure.  You should plan to take it easy, moving slowly for the rest of the day.  You can resume normal activity the day after the procedure however you should NOT DRIVE or use heavy machinery for 24 hours (because of the sedation medicines used during the test).    SYMPTOMS  TO REPORT IMMEDIATELY: A gastroenterologist can be reached at any hour.  During normal business hours, 8:30 AM to 5:00 PM Monday through Friday, call [phone].  After hours and on weekends, please call the GI answering service at [phone] who will take a message and have the physician on call contact you.   Following lower endoscopy (colonoscopy or flexible sigmoidoscopy):  Excessive amounts of blood in the stool  Significant tenderness or worsening of abdominal pains  Swelling of the abdomen that is new, acute  Fever of 100F or higher  Following upper endoscopy (EGD)  Vomiting of blood or coffee ground material  New chest pain or pain under the shoulder blades  Painful or persistently difficult swallowing  New shortness of breath  Fever of 100F or higher  Black, tarry-looking stools  FOLLOW UP: If any biopsies were taken you will be contacted by phone or by letter within the next 1-3 weeks.  Call your gastroenterologist if you have not heard about the biopsies in 3 weeks.  Our staff will call the home number listed on your records the next business day following your procedure to check on you and address any questions or concerns that you may have at that time regarding the information given to you following your procedure. This is a courtesy call and so if there is no answer at the home number and we have not heard from you through the emergency physician  on call, we will assume that you have returned to your regular daily activities without incident.  SIGNATURES/CONFIDENTIALITY: You and/or your care partner have signed paperwork which will be entered into your electronic medical record.  These signatures attest to the fact that that the information above on your After Visit Summary has been reviewed and is understood.  Full responsibility of the confidentiality of this discharge information lies with you and/or your care-partner.

## 2014-10-29 NOTE — Progress Notes (Signed)
Stable to RR 

## 2014-10-30 ENCOUNTER — Other Ambulatory Visit: Payer: Self-pay

## 2014-10-30 ENCOUNTER — Telehealth: Payer: Self-pay | Admitting: *Deleted

## 2014-10-30 DIAGNOSIS — D509 Iron deficiency anemia, unspecified: Secondary | ICD-10-CM

## 2014-10-30 NOTE — Therapy (Signed)
[facility] [place], [place], [address] Phone: [phone]   Fax:  [phone]  Patient Details  Name: Samantha [name] MRN: [id number] Date of Birth: February 21, 1966  Encounter Date: 10/30/2014   PHYSICAL THERAPY DISCHARGE SUMMARY  Visits from Start of Care: 4  Pt attended 4 visits of PT for neck pain and weakness. Pt has not attended any further visits and has no future appts scheduled. Upon calling to inquire about pt's plan to continue, pt requested to be discharged from PT, and she does not plan to return, at this time.   Current functional level related to goals / functional outcomes: PT LONG TERM GOAL #1     Title  "Pt will be independent with advanced HEP.     Time  6    Period  Weeks    Status  On-going    PT LONG TERM GOAL #2    Title  "Pt will tolerate standing for 30 mins and walking for 1 hour without increased pain in order to return to max level of function.     Time  6    Period  Weeks    Status  On-going    PT LONG TERM GOAL #3    Title  "Pain will decrease to 2/10 with all functional activities    Time  6    Period  Weeks    Status  On-going    PT LONG TERM GOAL #4    Title  Gross strength improves to 4-/5 for improved ability to perform ADLs.     Period  Weeks    Status  On-going        10/21/14 1650    PT LONG TERM GOAL #1    Title  "Pt will be independent with advanced HEP.     Time  6    Period  Weeks    Status  On-going    PT LONG TERM GOAL #2    Title  "Pt will tolerate standing for 30 mins and walking for 1 hour without increased pain in order to return to max level of function.     Time  6    Period  Weeks    Status  On-going    PT LONG TERM GOAL #3    Title  "Pain will decrease to 2/10 with all functional activities    Time  6    Status  On-going    PT LONG TERM GOAL #4    Title  Gross strength  improves to 4-/5 for improved ability to perform ADLs.     Time  6    Status  Achieved         Remaining deficits: unknown   Education / Equipment: HEP  Plan: Patient agrees to discharge.  Patient goals were not met. Patient is being discharged due to the patient's request.  ?????         Dollene Cleveland, PT, DPT 10/30/2014 4:49 PM Phone: [phone] Fax: [facility] [address] [place], [place], [address] Phone: [phone]   Fax:  [phone]

## 2014-10-30 NOTE — Telephone Encounter (Signed)
  Follow up Call-  Call back number 10/29/2014  Post procedure Call Back phone  # [phone]  Permission to leave phone message Yes     Patient questions:  Do you have a fever, pain , or abdominal swelling? No. Pain Score  0 *  Have you tolerated food without any problems? Yes.    Have you been able to return to your normal activities? Yes.    Do you have any questions about your discharge instructions: Diet   No. Medications  No. Follow up visit  No.  Do you have questions or concerns about your Care? No.  Actions: * If pain score is 4 or above: No action needed, pain <4.

## 2014-11-06 ENCOUNTER — Encounter: Payer: Self-pay | Admitting: Internal Medicine

## 2014-11-10 ENCOUNTER — Other Ambulatory Visit: Payer: Self-pay | Admitting: Internal Medicine

## 2014-12-16 ENCOUNTER — Telehealth: Payer: Self-pay | Admitting: Neurology

## 2014-12-16 NOTE — Telephone Encounter (Signed)
Spoke to husband - stated memory worsening has been a gradual change for her and he would like for her to be seen earlier.  Her appt has been moved up.

## 2014-12-16 NOTE — Telephone Encounter (Signed)
Patient's husband is calling with concerns about wife's memory. Patient got lost on way back from husband's place of employment and is asking the same question constantly.  Does patient need to have more tests?  Please call.

## 2014-12-16 NOTE — Telephone Encounter (Signed)
Left message for husband to all back.

## 2014-12-18 ENCOUNTER — Ambulatory Visit (INDEPENDENT_AMBULATORY_CARE_PROVIDER_SITE_OTHER): Payer: BLUE CROSS/BLUE SHIELD | Admitting: Neurology

## 2014-12-18 ENCOUNTER — Encounter: Payer: Self-pay | Admitting: Neurology

## 2014-12-18 VITALS — BP 140/90 | HR 90 | Ht <= 58 in | Wt 136.0 lb

## 2014-12-18 DIAGNOSIS — Z789 Other specified health status: Secondary | ICD-10-CM

## 2014-12-18 DIAGNOSIS — R413 Other amnesia: Secondary | ICD-10-CM | POA: Insufficient documentation

## 2014-12-18 DIAGNOSIS — G919 Hydrocephalus, unspecified: Secondary | ICD-10-CM

## 2014-12-18 DIAGNOSIS — R410 Disorientation, unspecified: Secondary | ICD-10-CM

## 2014-12-18 MED ORDER — LAMOTRIGINE 25 MG PO TABS: ORAL_TABLET | ORAL | Status: DC

## 2014-12-18 MED ORDER — DULOXETINE HCL 60 MG PO CPEP: 60.0000 mg | ORAL_CAPSULE | Freq: Every day | ORAL | Status: DC

## 2014-12-18 NOTE — Progress Notes (Signed)
[facility]  PATIENT: Samantha [name] DOB: 1966/08/23  HISTORY OF PRESENT ILLNESS: Ms. Lumadue 49 -year-old female returns for followup headaches, history of hydrocephalus, neck pain  She was, referred by her primary care physician Dr. Alain Marion for evaluation of acute onset left-sided numbness in June 30th 2014.  She had a history of pituitary cyst, was diagnosed at age 18, she presented with right visual loss, difficulty walking, short statue, she had a right frontal craniotomy at age 25, 33 weeks later, she developed worsening headaches, memory loss, was found to have obstructive hydrocephalus, she had right parietal VP shunt placement.   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  In Mar 27 2013, she got up in the morning, trying to get off the bed, she suddenly felt something pulling in her neck, in the same time, she noticed numbness in her left arm, neck, but sparing left face, she also has some subjective weakness, the dense numbness lasting for 24 hours, much improved now, but she still not back to her normal self she still has intermittent numbness tingling involving the left arm, left leg   MRI of the brain in April 2014 showed moderate enlargement of the third and lateral ventricles, unchanged. There is a right parietal shunt catheter extending into the right lateral ventricle with the tip in the temporal horn. This is unchanged. The fourth ventricle is not dilated. Right frontal craniotomy. Encephalomalacia right frontal lobe related to prior surgery. There is enlargement of the right frontal horn related to volume loss in the right frontal lobe.   MRI cervical showed focal medial foraminal disc protrusion at C5-6. Lateral foraminal disc osteophyte complex on the right at C7- T1.  Electrodiagnostic study was normal, there was no evidence of right  cervical radiculopathy.   She was given prescription of Flexeril, tramadol, without significant improvement ,   She had a history of obstructive sleep sleep apnea, she used to use CPAP machine, but has not had her settings checked for many years, she complains of nighttime snoring, excessive daytime fatigue, sleepiness,   Laboratory Evaluation showed normal B12, TSH, ANA, CMP, CBC, elevated ESR 47 in 2014,   UPDATE Sep 15th 2015:  Last clinical visit was with Hoyle Sauer, over the past few months, she complains of worsening neck pain,  neck pain, upper back pain, She has tried tramadol, Flexeril, without help, is taking Cymbalta, Lexapro, Lyrica, despite polypharmacy, she continued to have significant difficulty, generalized fatigue, bilateral upper and lower extremity heaviness, lack of stamina, frequent headaches, increased nocturia, she also complains of increased confusion, memory trouble, need help from her husband to maintain daily household chores,  Recent laboratory evaluation has demonstrated anemia, hemoglobin was 9.4, which is a change compared to previous 13.5, she is referred to hematologist,  She is in the process of applying for long-term disability, social disability  UPDATE Feb 4th 2016: She is accompanied by her husband, and friends Hinton Dyer at today's clinical visit, she continued to have excessive fatigue, frequent neck pain, memory trouble, depression, difficulty focusing.  She has stopped working since 2014, following her recurrent episode of transient left-sided paresthesia, she continually had similar recurrent episodes, sometimes with right facial twitching, mild confusion, no loss of consciousness, no seizure-like event,  I have reviewed MRI of the brain in 2011, in comparison in 2014, and also MRI cervical spine with patient, and her family,  reported detailed above, there was no significant change in the size of her hydrocephalus in repeat MRI of brain with, overall, her  headache has slight improvement, especially since she quit working in 2014, with less stress, she only taking Imitrex occasionally for severe typical migraine headaches, which works well for her, she also showed improvement in her concentration, mood disorder, fatigue, after stopped working,   REVIEW OF SYSTEMS: Full 14 system review of systems performed and notable only for those listed, all others are neg: Decreased activity, fatigue, constipation, daytime sleepiness, snoring, back pain, walking difficulty, neck pain, neck stiffness, memory loss, weakness, facial drooping, confusion, depression, anxiety.    ALLERGIES: Allergies  Allergen Reactions  . Citalopram Hydrobromide     REACTION: low libido    HOME MEDICATIONS: Outpatient Prescriptions Prior to Visit  Medication Sig Dispense Refill  . aspirin 81 MG tablet Take 81 mg by mouth daily.    Marland Kitchen atorvastatin (LIPITOR) 20 MG tablet TAKE 1 TABLET (20 MG TOTAL) BY MOUTH DAILY. 90 tablet 3  . Calcium Carbonate-Vitamin D (CALCIUM-VITAMIN D) 500-200 MG-UNIT per tablet Take 1 tablet by mouth 2 (two) times daily with a meal.    . DULoxetine (CYMBALTA) 20 MG capsule TAKE ONE CAPSULE BY MOUTH EVERY DAY 90 capsule 1  . escitalopram (LEXAPRO) 10 MG tablet TAKE 1 TABLET BY MOUTH EVERY DAY 90 tablet 3  . FeFum-FePoly-FA-B Cmp-C-Biot (INTEGRA PLUS) CAPS Take 1 capsule by mouth daily. (Patient not taking: Reported on 10/29/2014) 30 capsule 6  . HYDROcodone-acetaminophen (NORCO/VICODIN) 5-325 MG per tablet Take 1 tablet by mouth 2 (two) times daily as needed for moderate pain or severe pain. Please fill on or after 12/23/14 60 tablet 0  . LORazepam (ATIVAN) 1 MG tablet Take 1 tablet (1 mg total) by mouth 2 (two) times daily as needed for anxiety. 60 tablet 3  . omega-3 acid ethyl esters (LOVAZA) 1 G capsule Take 2 capsules (2 g total) by mouth 2 (two) times daily. 360 capsule 1  . omeprazole (PRILOSEC) 40 MG capsule Take 1 capsule (40 mg total) by mouth daily.  90 capsule 3  . SUMAtriptan (IMITREX) 100 MG tablet Take 1 tablet (100 mg total) by mouth daily as needed. 10 tablet 11  . traMADol (ULTRAM) 50 MG tablet   0  . pregabalin (LYRICA) 75 MG capsule Take 1 capsule (75 mg total) by mouth 3 (three) times daily as needed. 90 capsule 2   No facility-administered medications prior to visit.    PAST MEDICAL HISTORY: Past Medical History  Diagnosis Date  . Migraine   . Hyperlipidemia   . Cerebral ventricular shunt fitting or adjustment   . Anxiety   . Depression   . COMMON MIGRAINE 10/01/2007    Chronic    . GLUCOSE INTOLERANCE 10/01/2007    Qualifier: Diagnosis of  By: Jenny Reichmann MD, Hunt Oris   . HYPERLIPIDEMIA 10/01/2007    Chronic. Lovaza is too $$$ Declined statins due to worries   . OBSTRUCTIVE SLEEP APNEA 10/01/2007    NPSG 2006:  AHI 9/hr Started cpap 2009 successfully.     . Paresthesia   . Sleep apnea   . Anemia   . GERD (gastroesophageal reflux disease)   . Stroke 03/2013  . Memory loss     PAST SURGICAL HISTORY: Past Surgical History  Procedure Laterality Date  . Cholecystectomy  1981  . Pituitary cyst w/subsequent shunt    . Cyst removal neck      FAMILY HISTORY: Family History  Problem Relation Age of Onset  . Dementia Mother   . Mental illness Mother     Alzheimer's  . Cancer Mother     kidney ca  . Alzheimer's disease Mother   . Migraines Mother   . Colon polyps Mother   . Heart disease Father   . Diabetes Father   . Melanoma Other   . Lung cancer Other   . Lung cancer Other   . Lung cancer Other   . Colon cancer Neg Hx   . Brain cancer Maternal Aunt   . Lung cancer Maternal Uncle   . Esophageal cancer Maternal Grandmother   . Alzheimer's disease Paternal Grandmother     SOCIAL HISTORY: History   Social History  . Marital Status: Married    Spouse Name: Elta Guadeloupe    Number of Children: 0  . Years of Education: 12   Occupational History  . [employer]   Social History Main Topics   . Smoking status: Never Smoker   . Smokeless tobacco: Never Used  . Alcohol Use: 0.6 oz/week    1 Glasses of wine per week     Comment: Social  . Drug Use: No  . Sexual Activity: Yes   Other Topics Concern  . Not on file   Social History Narrative   Mother is in a NH.    Patient lives at home with her husband Elta Guadeloupe). 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    High school education.   Right handed.   Caffeine 1 cup daily.   She has no children     PHYSICAL EXAM  Filed Vitals:   12/18/14 1112  BP: 140/90  Pulse: 90  Height: 4' 9"  (1.448 m)  Weight: 136 lb (61.689 kg)   Body mass index is 29.42 kg/(m^2).  Generalized: Well developed, mildly obese female in no acute distress  Head: normocephalic and atraumatic,. Oropharynx benign  Neck: Supple, no carotid bruits, tenderness to palpation paraspinals  Cardiac: Regular rate rhythm, no murmur  Musculoskeletal: She has tenderness along bilateral upper trapezia, cervical, thoracic paraspinal regions upon deep palpation.  Neurological examination   Mentation: Alert oriented to time, place, history taking. Follows all commands speech and language fluent, MMSE 29/30. Missed 1/3 recall  Cranial nerve II-XII: Pupils were equal round reactive to light extraocular movements were full, visual field were full on confrontational test. Facial sensation and strength were normal. hearing was intact to finger rubbing bilaterally. Uvula tongue midline. head turning and shoulder shrug were normal and symmetric.Tongue protrusion into cheek strength was normal. Motor: normal bulk and tone, full strength in the BUE, BLE, fine finger movements normal, no pronator drift. No focal weakness Sensory: normal and symmetric to light touch, pinprick, and  vibration  Coordination: finger-nose-finger, heel-to-shin bilaterally, no dysmetria Reflexes: Brachioradialis 2/2, biceps 2/2, triceps 2/2, patellar 3/3, Achilles 2/2, plantar responses were  flexor on left, extensor on right. Gait and Station: Rising up from seated position without assistance, normal stance,  moderate stride, good arm swing, smooth turning, able to perform tiptoe, and heel walking without difficulty. Tandem gait is steady  DIAGNOSTIC DATA (LABS, IMAGING, TESTING) - I reviewed patient records, labs, notes, testing and imaging myself where available.  Lab Results  Component Value Date   WBC 8.6 08/29/2014   HGB 9.6* 08/29/2014   HCT 30.5* 08/29/2014   MCV 72.2* 08/29/2014   PLT 531.0* 08/29/2014      Component Value Date/Time  NA 138 08/29/2014 1120   K 3.6 08/29/2014 1120   CL 105 08/29/2014 1120   CO2 25 08/29/2014 1120   GLUCOSE 112* 08/29/2014 1120   GLUCOSE 108* 11/22/2006 0728   BUN 12 08/29/2014 1120   CREATININE 1.0 08/29/2014 1120   CALCIUM 9.8 08/29/2014 1120   PROT 7.4 07/08/2014 0825   ALBUMIN 3.5 07/08/2014 0825   AST 21 07/08/2014 0825   ALT 19 07/08/2014 0825   ALKPHOS 45 07/08/2014 0825   BILITOT 0.3 07/08/2014 0825   GFRNONAA 76.46 06/29/2010 1025   Lab Results  Component Value Date   CHOL 250* 07/08/2014   HDL 42.50 07/08/2014   LDLCALC 185* 07/08/2014   LDLDIRECT 207.7 05/02/2011   TRIG 115.0 07/08/2014   CHOLHDL 6 07/08/2014   Lab Results  Component Value Date   HGBA1C 5.8 08/29/2014   Lab Results  Component Value Date   VITAMINB12 335 07/08/2014   Lab Results  Component Value Date   TSH 4.05 07/08/2014    ASSESSMENT AND PLAN  49 y.o. year old female  has a past medical history of Migraine; and history of pituitary cyst status post craniotomy, hydrocephalus, right VP shunt placement. History of left-sided numbness. Now presenting with worsening memory trouble, fatigue, lack of stamina, gait difficulty, persistent neck pain, headaches, upper back pain. significant abnormality on MRI, enlargement of ventricular system, right frontal encephalomalacia, evidence of right frontal craniotomy, VP shunt, tip in the right  temporal horn  1. she has recurrent episode of right facial twitching, transient confusion, could indicate a complex partial seizure, EEG, add on Lamictal, titrating to 25 mg 2 tablets twice a day. 2. Increase Cymbalta to 60 mg daily 3. Return to clinic in 3 months.  60 minutes were spend in cordinating her care at today's visit, more than 50% time were spend in face to face consultation.  Orders Placed This Encounter  Procedures  . EEG adult    New Prescriptions   DULOXETINE (CYMBALTA) 60 MG CAPSULE    Take 1 capsule (60 mg total) by mouth daily.   LAMOTRIGINE (LAMICTAL) 25 MG TABLET    One tab po bid xone week, then 2 tabs po bid    Medications Discontinued During This Encounter  Medication Reason  . pregabalin (LYRICA) 75 MG capsule Change in therapy  . traMADol (ULTRAM) 50 MG tablet   . DULoxetine (CYMBALTA) 20 MG capsule     Return in about 3 months (around 03/18/2015).  Marcial Pacas, M.D. Ph.D.  [facility] [place], [place] [address] Phone: [phone] Fax:      [phone]  Marcial Pacas, M.D. Ph.D. [facility] [address] [place], [place] [address] [phone]

## 2014-12-25 ENCOUNTER — Ambulatory Visit (INDEPENDENT_AMBULATORY_CARE_PROVIDER_SITE_OTHER): Payer: BLUE CROSS/BLUE SHIELD | Admitting: Neurology

## 2014-12-25 DIAGNOSIS — G919 Hydrocephalus, unspecified: Secondary | ICD-10-CM | POA: Diagnosis not present

## 2014-12-25 DIAGNOSIS — R410 Disorientation, unspecified: Secondary | ICD-10-CM

## 2014-12-25 DIAGNOSIS — R413 Other amnesia: Secondary | ICD-10-CM

## 2014-12-29 ENCOUNTER — Telehealth: Payer: Self-pay

## 2014-12-29 NOTE — Telephone Encounter (Signed)
-----   Message from Lily LovingsLinda Ray Hunt, RN sent at 10/30/2014  8:42 AM EST ----- Regarding: Labs Pt needs labs, orders in epic.

## 2014-12-29 NOTE — Telephone Encounter (Signed)
Pt aware.

## 2014-12-30 ENCOUNTER — Telehealth: Payer: Self-pay | Admitting: Neurology

## 2014-12-30 ENCOUNTER — Encounter: Payer: Self-pay | Admitting: *Deleted

## 2014-12-30 NOTE — Telephone Encounter (Signed)
Pt is calling to get her EEG results.  Please call and advise.

## 2014-12-30 NOTE — Telephone Encounter (Signed)
Results are not available yet - pt aware.  She wants to call back herself next week to inquire again.

## 2015-01-08 ENCOUNTER — Other Ambulatory Visit (INDEPENDENT_AMBULATORY_CARE_PROVIDER_SITE_OTHER): Payer: Self-pay

## 2015-01-08 DIAGNOSIS — D509 Iron deficiency anemia, unspecified: Secondary | ICD-10-CM

## 2015-01-08 LAB — IBC PANEL
Iron: 17 ug/dL — ABNORMAL LOW (ref 42–145)
Saturation Ratios: 3.7 % — ABNORMAL LOW (ref 20.0–50.0)
Transferrin: 329 mg/dL (ref 212.0–360.0)

## 2015-01-08 LAB — CBC WITH DIFFERENTIAL/PLATELET
Basophils Absolute: 0 10*3/uL (ref 0.0–0.1)
Basophils Relative: 0.9 % (ref 0.0–3.0)
Eosinophils Absolute: 0.3 10*3/uL (ref 0.0–0.7)
Eosinophils Relative: 6.8 % — ABNORMAL HIGH (ref 0.0–5.0)
HCT: 27.9 % — ABNORMAL LOW (ref 36.0–46.0)
Hemoglobin: 9 g/dL — ABNORMAL LOW (ref 12.0–15.0)
Lymphocytes Relative: 34.1 % (ref 12.0–46.0)
Lymphs Abs: 1.7 10*3/uL (ref 0.7–4.0)
MCHC: 32.1 g/dL (ref 30.0–36.0)
MCV: 68.6 fl — ABNORMAL LOW (ref 78.0–100.0)
Monocytes Absolute: 0.7 10*3/uL (ref 0.1–1.0)
Monocytes Relative: 13 % — ABNORMAL HIGH (ref 3.0–12.0)
Neutro Abs: 2.3 10*3/uL (ref 1.4–7.7)
Neutrophils Relative %: 45.2 % (ref 43.0–77.0)
Platelets: 391 10*3/uL (ref 150.0–400.0)
RBC: 4.07 Mil/uL (ref 3.87–5.11)
RDW: 18.9 % — ABNORMAL HIGH (ref 11.5–15.5)
WBC: 5.1 10*3/uL (ref 4.0–10.5)

## 2015-01-09 ENCOUNTER — Ambulatory Visit (INDEPENDENT_AMBULATORY_CARE_PROVIDER_SITE_OTHER): Payer: BLUE CROSS/BLUE SHIELD | Admitting: Internal Medicine

## 2015-01-09 ENCOUNTER — Encounter: Payer: Self-pay | Admitting: Internal Medicine

## 2015-01-09 VITALS — BP 146/88 | HR 84 | Ht <= 58 in | Wt 138.1 lb

## 2015-01-09 DIAGNOSIS — K449 Diaphragmatic hernia without obstruction or gangrene: Secondary | ICD-10-CM

## 2015-01-09 DIAGNOSIS — K253 Acute gastric ulcer without hemorrhage or perforation: Secondary | ICD-10-CM

## 2015-01-09 DIAGNOSIS — D509 Iron deficiency anemia, unspecified: Secondary | ICD-10-CM

## 2015-01-09 DIAGNOSIS — K5909 Other constipation: Secondary | ICD-10-CM

## 2015-01-09 DIAGNOSIS — T50905A Adverse effect of unspecified drugs, medicaments and biological substances, initial encounter: Secondary | ICD-10-CM

## 2015-01-09 DIAGNOSIS — K219 Gastro-esophageal reflux disease without esophagitis: Secondary | ICD-10-CM

## 2015-01-09 DIAGNOSIS — K5903 Drug induced constipation: Secondary | ICD-10-CM

## 2015-01-09 MED ORDER — LUBIPROSTONE 8 MCG PO CAPS: 8.0000 ug | ORAL_CAPSULE | Freq: Two times a day (BID) | ORAL | Status: DC

## 2015-01-09 MED ORDER — RANITIDINE HCL 150 MG PO TABS: 150.0000 mg | ORAL_TABLET | Freq: Every day | ORAL | Status: DC

## 2015-01-09 NOTE — Patient Instructions (Addendum)
You have been scheduled for a capsule endoscopy. Please follow written instructions given to you at your visit today.  We have sent the following medications to your pharmacy for you to pick up at your convenience: Ranitidine 150 mg every night Amitiza 8 mcg twice daily  Please discontinue omeprazole.  You have been scheduled for an appointment with [facility] (2nd floor of our [facility]) on Monday, 02/10/15 with Dr Craige CottaSood.  You will be scheduled for IV iron. We will contact you once your appointment has been made.  Do not eat within 2 hours of going to bed.  Sleep on a wedge.  You will be due for labwork in 3 months. We will contact you to remind you of this.

## 2015-01-09 NOTE — Progress Notes (Signed)
Subjective:    Patient ID: Samantha [name], female    DOB: June 26, 1966, 49 y.o.   MRN: [id number]  HPI Samantha [name] is a 49 year old female known to me for evaluation of iron deficiency anemia, history of hiatal hernia with Cameron's lesions, history of obstructive hydrocephalus requiring VP shunt, glucose intolerance, and GERD who is seen in follow-up. She had a colonoscopy and an upper endoscopy performed on 10/29/2014 to evaluate iron deficiency anemia. Upper endoscopy revealed a nonobstructing Schatzki's ring at 30 cm, a 4 cm hiatal hernia and several nonbleeding Cameron's erosions near the diaphragmatic hiatus. The stomach was otherwise normal. The duodenum was normal but biopsied to exclude celiac disease. Small bowel biopsies normal. Colonoscopy was normal. She had blood counts and iron studies repeated this week which shows persistent anemia with a hemoglobin of 9.0 and persistent iron deficiency, percent iron saturation 4.1  She reports she is having several issues. She is having regurgitation, which is specifically worse at night. She admits to often eating late at night due to her work schedule. She is also having constipation which she relates to narcotic pain medication use. She's been using hydrocodone and at other times tramadol for back pain. Her back pain is chronic. She was taking omeprazole 40 mg usually in the morning. She is not having much heartburn during the daytime but more regurgitation at night. She is now off of all PPI therapy. She's tried MiraLAX for constipation with no benefit. She uses Dulcolax but this causes urgency at times explosive unpredicted stooling. Stool softeners have not been enough alone. She has not been taking oral iron because of the worsening constipation.  Review of Systems As per history of present illness, otherwise negative  Current Medications, Allergies, Past Medical History, Past Surgical History, Family History and Social History were reviewed  in [facility] electronic medical record.     Objective:   Physical Exam BP 146/88 mmHg  Pulse 84  Ht 4' 9.75" (1.467 m)  Wt 138 lb 2 oz (62.653 kg)  BMI 29.11 kg/m2  LMP 12/09/2014 Constitutional: Well-developed and well-nourished. No distress. HEENT: Normocephalic and atraumatic. Oropharynx is clear and moist. No oropharyngeal exudate. Conjunctivae are normal.  No scleral icterus. Neck: Neck supple. Trachea midline. Cardiovascular: Normal rate, regular rhythm and intact distal pulses.  Pulmonary/chest: Effort normal and breath sounds normal. No wheezing, rales or rhonchi. Abdominal: Soft, nontender, nondistended. Bowel sounds active throughout. There are no masses palpable. No hepatosplenomegaly. Extremities: no clubbing, cyanosis, or edema Neurological: Alert and oriented to person place and time. Skin: Skin is warm and dry. No rashes noted. Psychiatric: Normal mood and affect. Behavior is normal.  CBC    Component Value Date/Time   WBC 5.1 01/08/2015 1548   RBC 4.07 01/08/2015 1548   HGB 9.0* 01/08/2015 1548   HCT 27.9* 01/08/2015 1548   PLT 391.0 01/08/2015 1548   MCV 68.6 Repeated and verified X2.* 01/08/2015 1548   MCHC 32.1 01/08/2015 1548   RDW 18.9* 01/08/2015 1548   LYMPHSABS 1.7 01/08/2015 1548   MONOABS 0.7 01/08/2015 1548   EOSABS 0.3 01/08/2015 1548   BASOSABS 0.0 01/08/2015 1548    Iron/TIBC/Ferritin/ %Sat    Component Value Date/Time   IRON 17* 01/08/2015 1548   IRONPCTSAT 3.7* 01/08/2015 1548   EGD, colonoscopy and pathology results reviewed with the patient     Assessment & Plan:   49 year old female known to me for evaluation of iron deficiency anemia, history of hiatal hernia with Cameron's lesions, history  of obstructive hydrocephalus requiring VP shunt, glucose intolerance, and GERD who is seen in follow-up.  1. GERD with HH -- symptoms are primarily nocturnal. I have recommended ranitidine 150 mg each evening. Also recommended that she by  a wedge or prop the head of her bed to help prevent nocturnal reflux. I've also instructed her not eating or drink within 2 hours of lying down. GERD diet.  2. IDA -- Cameron's lesions are a possible explanation for persistent iron deficiency. I recommended video capsule endoscopy to exclude a small bowel source of iron deficiency anemia such as angiodysplastic lesions.  We discussed the risks and benefits of capsule and she is agreeable to proceed. I also recommend IV iron due to her constipation and previous intolerance of oral iron. We will order this infusion at the hospital  3. Constipation, narcotic induced -- lubiprostone 8 g twice daily  Repeat CBC and iron studies in 3 months

## 2015-01-12 ENCOUNTER — Encounter: Payer: Self-pay | Admitting: Pulmonary Disease

## 2015-01-12 ENCOUNTER — Other Ambulatory Visit: Payer: Self-pay | Admitting: Internal Medicine

## 2015-01-12 ENCOUNTER — Telehealth: Payer: Self-pay | Admitting: *Deleted

## 2015-01-12 ENCOUNTER — Ambulatory Visit (INDEPENDENT_AMBULATORY_CARE_PROVIDER_SITE_OTHER): Payer: BLUE CROSS/BLUE SHIELD | Admitting: Pulmonary Disease

## 2015-01-12 VITALS — BP 136/90 | HR 100 | Temp 98.5°F | Ht <= 58 in | Wt 137.8 lb

## 2015-01-12 DIAGNOSIS — R569 Unspecified convulsions: Secondary | ICD-10-CM

## 2015-01-12 DIAGNOSIS — D509 Iron deficiency anemia, unspecified: Secondary | ICD-10-CM

## 2015-01-12 DIAGNOSIS — G40909 Epilepsy, unspecified, not intractable, without status epilepticus: Secondary | ICD-10-CM

## 2015-01-12 DIAGNOSIS — G4733 Obstructive sleep apnea (adult) (pediatric): Secondary | ICD-10-CM

## 2015-01-12 MED ORDER — LINACLOTIDE 145 MCG PO CAPS: 145.0000 ug | ORAL_CAPSULE | Freq: Every day | ORAL | Status: DC

## 2015-01-12 NOTE — Progress Notes (Signed)
Per Dr Rhea BeltonPyrtle, [facility] 1 week apart. Goal hgb 13. Patient has been scheduled for [facility]eraheme [facility]15 [facility]Dee[facility] and on 3/222/16 @ 2:00 pm. Patient verbalizes understanding of times, dates and location of infusions. Orders have been placed in EPIC.

## 2015-01-12 NOTE — Patient Instructions (Signed)
Will arrange [facility] Follow up in 6 weeks

## 2015-01-12 NOTE — Progress Notes (Deleted)
   Subjective:    Patient ID: Juanda BondMichele S Humiston, female    DOB: [birth year], 49 y.o.   MRN: [id number]  HPI    Review of Systems  Constitutional: Positive for unexpected weight change. Negative for fever.  HENT: Positive for dental problem and trouble swallowing. Negative for congestion, ear pain, nosebleeds, postnasal drip, rhinorrhea, sinus pressure, sneezing and sore throat.   Eyes: Negative for redness and itching.  Respiratory: Positive for shortness of breath. Negative for cough, chest tightness and wheezing.   Cardiovascular: Negative for palpitations and leg swelling.  Gastrointestinal: Negative for nausea and vomiting.       ACID HEARTBURN / INDIGESTION  Genitourinary: Negative for dysuria.  Musculoskeletal: Positive for arthralgias. Negative for joint swelling.  Skin: Negative for rash.  Neurological: Positive for headaches.  Hematological: Does not bruise/bleed easily.  Psychiatric/Behavioral: Positive for dysphoric mood. The patient is nervous/anxious.        Objective:   Physical Exam        Assessment & Plan:

## 2015-01-12 NOTE — Telephone Encounter (Signed)
I have spoken to patient regarding the information below. She verbalizes understanding. Rx for Linzess sent to pharmacy.

## 2015-01-12 NOTE — Telephone Encounter (Signed)
Patient's insurance company prefers patient take Linzess prior to trying Amitiza. Per our office notes, the only med I have that patient has previously tried is Miralax. Please advise... May we try Linzess?

## 2015-01-12 NOTE — Telephone Encounter (Signed)
Ok for trial of Linzess 145 mcg daily  Notify of side-effects of diarrhea and nausea, and if either occur she should notify us

## 2015-01-12 NOTE — Progress Notes (Signed)
Chief Complaint  Patient presents with  . SLEEP CONSULT    Referred by Dr Rhea BeltonPyrtle. Sleep study with [facility]. Epworth Score: 14    History of Present Illness: Samantha [name] is a 49 y.o. female for evaluation of sleep problems.  She has a history of sleep apnea.  She was previously using CPAP.  She used to work with [employer].  Her machine is very old, and she has not used this for several years.  She reports having a stroke about 2 years ago.  There is concern she has nocturnal seizures.  She is followed by neurology and had recent EEG done >> results are pending at time of this evaluation.  She has been getting more sleepy during the day.  She does not go into a deep sleep anymore.  She is snoring more, and her husband is concerned that she stops breathing while asleep.  She also has difficulty sleeping due to neck pain.  She can sleep for 14 hours at a time, but still feel tired.  She can't sleep on her back >> gets short of breath.  She goes to sleep at 830 pm.  She falls asleep after 20 minutes.  She wakes up 1 or 2 times to use the bathroom.  She gets out of bed between 9 and 11 am.  She feels tired in the morning.  She occasionally gets morning headache.  She occasionally uses tylenol PM.  She will drink 1 cup of coffee in the morning.  She denies sleep walking, sleep talking, bruxism, or nightmares.  There is no history of restless legs.  She denies sleep hallucinations, sleep paralysis, or cataplexy.  The Epworth score is 14 out of 24.  Tests: PSG 12/24/04 >> RDI 9 PSG 05/30/13 >> AHI 3  Samantha [name]  has a past medical history of Migraine; Hyperlipidemia; Cerebral ventricular shunt fitting or adjustment; Anxiety; Depression; COMMON MIGRAINE (10/01/2007); GLUCOSE INTOLERANCE (10/01/2007); HYPERLIPIDEMIA (10/01/2007); OBSTRUCTIVE SLEEP APNEA (10/01/2007); Paresthesia; Sleep apnea; Anemia; GERD (gastroesophageal reflux disease); Stroke (03/2013); Memory loss; Hiatal hernia; Schatzki's  ring; Hydrocephalus; and IDA (iron deficiency anemia).  Samantha [name]  has past surgical history that includes Cholecystectomy (1981); Pituitary cyst w/subsequent shunt; and Cyst removal neck.  Prior to Admission medications   Medication Sig Start Date End Date Taking? Authorizing Provider  aspirin 81 MG tablet Take 81 mg by mouth daily.    Historical Provider, MD  atorvastatin (LIPITOR) 20 MG tablet TAKE 1 TABLET (20 MG TOTAL) BY MOUTH DAILY. 05/07/14   Aleksei Plotnikov V, MD  Calcium Carbonate-Vitamin D (CALCIUM-VITAMIN D) 500-200 MG-UNIT per tablet Take 1 tablet by mouth 2 (two) times daily with a meal.    Historical Provider, MD  DULoxetine (CYMBALTA) 60 MG capsule Take 1 capsule (60 mg total) by mouth daily. 12/18/14   Levert FeinsteinYijun Yan, MD  escitalopram (LEXAPRO) 10 MG tablet TAKE 1 TABLET BY MOUTH EVERY DAY 09/01/14   Tresa GarterAleksei Plotnikov V, MD  HYDROcodone-acetaminophen (NORCO/VICODIN) 5-325 MG per tablet Take 1 tablet by mouth 2 (two) times daily as needed for moderate pain or severe pain. Please fill on or after 12/23/14 10/22/14   Tresa GarterAleksei Plotnikov V, MD  lamoTRIgine (LAMICTAL) 25 MG tablet One tab po bid xone week, then 2 tabs po bid 12/18/14   Levert FeinsteinYijun Yan, MD  LORazepam (ATIVAN) 1 MG tablet Take 1 tablet (1 mg total) by mouth 2 (two) times daily as needed for anxiety. 08/29/14   Tresa GarterAleksei Plotnikov V, MD  lubiprostone (AMITIZA) 8  MCG capsule Take 1 capsule (8 mcg total) by mouth 2 (two) times daily with a meal. 01/09/15   Beverley Fiedler, MD  omega-3 acid ethyl esters (LOVAZA) 1 G capsule Take 2 capsules (2 g total) by mouth 2 (two) times daily. 05/12/14 05/12/15  Aleksei Plotnikov V, MD  ranitidine (ZANTAC) 150 MG tablet Take 1 tablet (150 mg total) by mouth at bedtime. 01/09/15   Beverley Fiedler, MD  SUMAtriptan (IMITREX) 100 MG tablet Take 1 tablet (100 mg total) by mouth daily as needed. 05/02/11   Tresa Garter, MD    Allergies  Allergen Reactions  . Citalopram Hydrobromide     REACTION: low libido     Her family history includes Alzheimer's disease in her mother and paternal grandmother; Brain cancer in her maternal aunt; Colon polyps in her mother; Dementia in her mother; Diabetes in her father; Esophageal cancer in her maternal grandmother; Heart disease in her father; Kidney cancer in her mother; Lung cancer in her maternal uncle, other, and other; Melanoma in her other; Migraines in her mother. There is no history of Colon cancer.  She  reports that she has never smoked. She has never used smokeless tobacco. She reports that she drinks about 0.6 oz of alcohol per week. She reports that she does not use illicit drugs.  Review of Systems  Constitutional: Positive for unexpected weight change. Negative for fever.  HENT: Positive for dental problem and trouble swallowing. Negative for congestion, ear pain, nosebleeds, postnasal drip, rhinorrhea, sinus pressure, sneezing and sore throat.   Eyes: Negative for redness and itching.  Respiratory: Positive for shortness of breath. Negative for cough, chest tightness and wheezing.   Cardiovascular: Negative for palpitations and leg swelling.  Gastrointestinal: Negative for nausea and vomiting.       ACID HEARTBURN / INDIGESTION  Genitourinary: Negative for dysuria.  Musculoskeletal: Positive for arthralgias. Negative for joint swelling.  Skin: Negative for rash.  Neurological: Positive for headaches.  Hematological: Does not bruise/bleed easily.  Psychiatric/Behavioral: Positive for dysphoric mood. The patient is nervous/anxious.    Physical Exam: Blood pressure 136/90, pulse 100, temperature 98.5 F (36.9 C), temperature source Oral, height  (1.473 m), weight 137 lb 12.8 oz (62.506 kg), last menstrual period 12/09/2014, SpO2 99 %. Body mass index is 28.81 kg/(m^2).  General - No distress ENT - No sinus tenderness, no oral exudate, no LAN, no thyromegaly, TM clear, pupils equal/reactive, MP 3, elongated uvula Cardiac - s1s2 regular,  no murmur, pulses symmetric Chest - No wheeze/rales/dullness, good air entry, normal respiratory excursion Back - No focal tenderness Abd - Soft, non-tender, no organomegaly, + bowel sounds Ext - No edema Neuro - Normal strength, cranial nerves intact Skin - No rashes Psych - Normal mood, and behavior  Discussion: She has snoring, sleep disruption, witnessed apnea, and daytime sleepiness.  She has previous sleeps studies which have shown borderline sleep apnea.  She has gained weight since those sleeps studies were performed.  She also has a history of hydrocephalus and concern for nocturnal seizures >> she is followed by Dr. Terrace Arabia with [facility] neurology.  Assessment/plan:  Obstructive sleep. Plan: - given her history of nocturnal seizures I would like to have her do sleep study with seizure montage >> will try to arrange for this to be done at sleep lab with [facility] neurology so EEG portion can be reviewed by neurologist   Coralyn Helling, M.D. Pager [phone]

## 2015-01-15 NOTE — Progress Notes (Signed)
I have reviewed and agreed above plan. 

## 2015-01-16 ENCOUNTER — Telehealth: Payer: Self-pay | Admitting: Internal Medicine

## 2015-01-16 NOTE — Procedures (Signed)
   HISTORY: 49 years old female, with history of hydrocephalus, now presenting with frequent headaches, memory loss, neck pain.  TECHNIQUE:  16 channel EEG was performed based on standard 10-16 international system. One channel was dedicated to EKG, which has demonstrates normal sinus rhythm of 72 beats per minutes.  Upon awakening, the posterior background activity was well-developed, in alpha range, 9 Hz, with amplitude of 40 microvoltage, reactive to eye opening and closure.  There was no evidence of epileptiform discharge.  Photic stimulation was performed, which induced a symmetric photic driving.  Hyperventilation was performed, there was no abnormality elicit.  Stage II sleep was achieved.  CONCLUSION: This is a  normal awake and asleep EEG.  There is no electrodiagnostic evidence of epileptiform discharge

## 2015-01-16 NOTE — Telephone Encounter (Signed)
I have spoken to patient and advised that the prior auth needed was for amitiza. However, this was already denied by insurance company and I spoke to the patient on 01/12/15 about this. I also sent a new script for Linzess and asked the pharmacy to d/c the amitiza so I am unsure why they did this.

## 2015-01-18 ENCOUNTER — Other Ambulatory Visit: Payer: Self-pay | Admitting: Internal Medicine

## 2015-01-19 ENCOUNTER — Telehealth: Payer: Self-pay | Admitting: Neurology

## 2015-01-19 NOTE — Telephone Encounter (Signed)
Scheduled/confirmed appointment with patient 01/22/15 with Dr Frances FurbishAthar.

## 2015-01-20 ENCOUNTER — Telehealth: Payer: Self-pay | Admitting: Neurology

## 2015-01-20 NOTE — Telephone Encounter (Signed)
Please note that this patient is a sleep study review. Dr. Craige CottaSood has requested a sleep study with seizure montage. Please send me a internal sleep study review request. Patient does not need to be seen in clinic for this. She has a sleep physician but he has suggested that we do the sleep study w extended montage d/t her Sz disorder, for which she sees Dr. Terrace ArabiaYan.

## 2015-01-21 ENCOUNTER — Telehealth: Payer: Self-pay | Admitting: Neurology

## 2015-01-21 ENCOUNTER — Telehealth: Payer: Self-pay | Admitting: Internal Medicine

## 2015-01-21 NOTE — Telephone Encounter (Signed)
Called and cancelled appointment for 01/22/15,per Dr Teofilo PodAthar's telephone note from 01/20/15.

## 2015-01-21 NOTE — Telephone Encounter (Signed)
Called and cancelled appointment for 01/22/15

## 2015-01-21 NOTE — Telephone Encounter (Signed)
Pts capsule endo rescheduled to 01/26/15@8am , updated prep instructions left up front for pt to pickup.

## 2015-01-22 ENCOUNTER — Institutional Professional Consult (permissible substitution): Payer: Self-pay | Admitting: Neurology

## 2015-01-22 ENCOUNTER — Telehealth: Payer: Self-pay | Admitting: Neurology

## 2015-01-22 DIAGNOSIS — R569 Unspecified convulsions: Secondary | ICD-10-CM

## 2015-01-22 DIAGNOSIS — R51 Headache: Secondary | ICD-10-CM

## 2015-01-22 DIAGNOSIS — G911 Obstructive hydrocephalus: Secondary | ICD-10-CM

## 2015-01-22 DIAGNOSIS — R351 Nocturia: Secondary | ICD-10-CM

## 2015-01-22 DIAGNOSIS — R519 Headache, unspecified: Secondary | ICD-10-CM

## 2015-01-22 DIAGNOSIS — G4733 Obstructive sleep apnea (adult) (pediatric): Secondary | ICD-10-CM

## 2015-01-22 DIAGNOSIS — G4719 Other hypersomnia: Secondary | ICD-10-CM

## 2015-01-22 DIAGNOSIS — G40909 Epilepsy, unspecified, not intractable, without status epilepticus: Secondary | ICD-10-CM

## 2015-01-22 DIAGNOSIS — Z982 Presence of cerebrospinal fluid drainage device: Secondary | ICD-10-CM

## 2015-01-22 NOTE — Telephone Encounter (Signed)
Dr. Coralyn HellingVineet Sood refers patient for attended sleep study.  Height: 4'10"  Weight: 137 lb 12.8 oz  BMI: 28.81  Past Medical History:  Migraine    . Hyperlipidemia   . Cerebral ventricular shunt fitting or adjustment   . Anxiety   . Depression   . COMMON MIGRAINE 10/01/2007    Chronic   . GLUCOSE INTOLERANCE 10/01/2007    Qualifier: Diagnosis of By: Jonny RuizJohn MD, Len BlalockJames W   . HYPERLIPIDEMIA 10/01/2007    Chronic. Lovaza is too $$$ Declined statins due to worries   . OBSTRUCTIVE SLEEP APNEA 10/01/2007    NPSG 2006: AHI 9/hr Started cpap 2009 successfully.   . Paresthesia   . Sleep apnea   . Anemia   . GERD (gastroesophageal reflux disease)   . Stroke 03/2013  . Memory loss             Sleep Symptoms: She has been getting more sleepy during the day. She does not go into a deep sleep anymore. She is snoring more, and her husband is concerned that she stops breathing while asleep. She also has difficulty sleeping due to neck pain. She can sleep for 14 hours at a time, but still feel tired. She can't sleep on her back >> gets short of breath. She goes to sleep at 830 pm. She falls asleep after 20 minutes. She wakes up 1 or 2 times to use the bathroom. She gets out of bed between 9 and 11 am. She feels tired in the morning. She occasionally gets morning headache. She occasionally uses tylenol PM. She has gained weight since those sleeps studies were performed. She also has a history of hydrocephalus and concern for nocturnal seizures >> she is [facility]an [facility].   Epworth Score: ESS (14)   Medication:  Aspirin (Tab) aspirin 81 MG Take 81 mg by mouth daily.       Atorvastatin Calcium (Tab) LIPITOR 20 MG TAKE 1 TABLET (20 MG TOTAL) BY MOUTH DAILY.      Calcium Carbonate-Vitamin D (Tab) calcium-vitamin D 500-200 MG-UNIT Take 1 tablet by mouth 2 (two) times daily with a meal.       DULoxetine HCl (Cap DR Particles) CYMBALTA 60 MG Take 1 capsule (60 mg total) by mouth daily.      Escitalopram Oxalate (Tab) LEXAPRO 10 MG TAKE 1 TABLET BY MOUTH EVERY DAY      Hydrocodone-Acetaminophen (Tab) NORCO/VICODIN 5-325 MG Take 1 tablet by mouth 2 (two) times daily as needed for moderate pain or severe pain. Please fill on or after 12/23/14      LORazepam (Tab) ATIVAN 1 MG Take 1 tablet (1 mg total) by mouth 2 (two) times daily as needed for anxiety.      LamoTRIgine (Tab) LAMICTAL 25 MG One tab po bid xone week, then 2 tabs po bid      Linaclotide (Cap) LINZESS 145 MCG Take 1 capsule (145 mcg total) by mouth daily.      Omega-3-acid Ethyl Esters (Cap) LOVAZA 1 G TAKE 2 CAPSULES (2 G TOTAL) BY MOUTH 2 (TWO) TIMES DAILY.      Ranitidine HCl (Tab) ZANTAC 150 MG Take 1 tablet (150 mg total) by mouth at bedtime.      SUMAtriptan Succinate (Tab) IMITREX 100 MG Take 1 tablet (100 mg total) by mouth daily as needed.      .         Ins: [employer]   Assessment & Plan:  Obstructive  sleep. Plan: - given her history of nocturnal seizures I would like to have her do sleep study with seizure montage >> will try to arrange for this to be done at sleep lab with [facility] so EEG portion can be reviewed by neurologist   Please review patient information and submit instructions for scheduling and orders for sleep technologist. Thank you.

## 2015-01-23 ENCOUNTER — Encounter: Payer: Self-pay | Admitting: Internal Medicine

## 2015-01-23 ENCOUNTER — Ambulatory Visit (INDEPENDENT_AMBULATORY_CARE_PROVIDER_SITE_OTHER): Payer: BLUE CROSS/BLUE SHIELD | Admitting: Internal Medicine

## 2015-01-23 VITALS — BP 146/98 | HR 90 | Wt 136.0 lb

## 2015-01-23 DIAGNOSIS — M79605 Pain in left leg: Secondary | ICD-10-CM

## 2015-01-23 DIAGNOSIS — K2100 Gastro-esophageal reflux disease with esophagitis, without bleeding: Secondary | ICD-10-CM

## 2015-01-23 DIAGNOSIS — S91301A Unspecified open wound, right foot, initial encounter: Secondary | ICD-10-CM

## 2015-01-23 DIAGNOSIS — R202 Paresthesia of skin: Secondary | ICD-10-CM

## 2015-01-23 DIAGNOSIS — M545 Low back pain, unspecified: Secondary | ICD-10-CM

## 2015-01-23 DIAGNOSIS — D500 Iron deficiency anemia secondary to blood loss (chronic): Secondary | ICD-10-CM

## 2015-01-23 DIAGNOSIS — S91309A Unspecified open wound, unspecified foot, initial encounter: Secondary | ICD-10-CM | POA: Insufficient documentation

## 2015-01-23 DIAGNOSIS — K5900 Constipation, unspecified: Secondary | ICD-10-CM | POA: Insufficient documentation

## 2015-01-23 DIAGNOSIS — Z23 Encounter for immunization: Secondary | ICD-10-CM

## 2015-01-23 DIAGNOSIS — K5901 Slow transit constipation: Secondary | ICD-10-CM

## 2015-01-23 MED ORDER — LINACLOTIDE 290 MCG PO CAPS: 290.0000 ug | ORAL_CAPSULE | Freq: Every day | ORAL | Status: DC

## 2015-01-23 MED ORDER — HYDROCODONE-ACETAMINOPHEN 5-325 MG PO TABS: 1.0000 | ORAL_TABLET | Freq: Two times a day (BID) | ORAL | Status: DC | PRN

## 2015-01-23 NOTE — Assessment & Plan Note (Signed)
Dr [facility] On Zantac

## 2015-01-23 NOTE — Progress Notes (Signed)
Pre visit review using our clinic review tool, if applicable. No additional management support is needed unless otherwise documented below in the visit note. 

## 2015-01-23 NOTE — Progress Notes (Signed)
Subjective:    HPI    C/o L big toe wound x2d  F/u L side pain - (back, leg, arm) hurts a lot - tramadol not working; pain is 5/10 on Norco only  F/u GERD. C/o still being depressed, tired She was able to get Lyrica, Cymbalta - PA.  F/u worsening lower neck pain, worse on the left, with pain to left upper back/shoulder and numbness to left arm extending distally, similar in area that seemed to start months ago in bed when she stretched and reached for an object on the bedside table and felt a pop and pain lasting several days and seemed better until the return worse than ever now mod to severe;  No LUE weakness or loss of grip strength.  Also mentions similar left lower back pain with LLE numbness as well but much less severe pain, mild only, with LLE numbness but no weakness.  No HA and Pt denies new neurological symptoms such as new headache, or facial or extremity weakness as above.  No prior hx of CVA. C/o worsening depressive symptoms, suicidal ideation, or panic; has ongoing anxiety.  H/o MS   Dr Terrace Arabia: sx's suggestive of a right internal capsule thalamus small vessel disease, which can be missed by MRI scan, MRI of the brain showed stable chronic changes detailed above, there was no acute lesions  She had a sleep study last night   Wt Readings from Last 3 Encounters:  01/23/15 136 lb (61.689 kg)  01/12/15 137 lb 12.8 oz (62.506 kg)  01/09/15 138 lb 2 oz (62.653 kg)   BP Readings from Last 3 Encounters:  01/23/15 146/98  01/12/15 136/90  01/09/15 146/88      Past Medical History  Diagnosis Date  . Migraine   . Hyperlipidemia   . Cerebral ventricular shunt fitting or adjustment   . Anxiety   . Depression   . COMMON MIGRAINE 10/01/2007    Chronic    . GLUCOSE INTOLERANCE 10/01/2007    Qualifier: Diagnosis of  By: Jonny Ruiz MD, Len Blalock   . HYPERLIPIDEMIA 10/01/2007    Chronic. Lovaza is too $$$ Declined statins due to worries   . OBSTRUCTIVE SLEEP APNEA 10/01/2007     NPSG 2006:  AHI 9/hr Started cpap 2009 successfully.     . Paresthesia   . Sleep apnea   . Anemia   . GERD (gastroesophageal reflux disease)   . Stroke 03/2013  . Memory loss   . Hiatal hernia   . Schatzki's ring   . Hydrocephalus   . IDA (iron deficiency anemia)    Past Surgical History  Procedure Laterality Date  . Cholecystectomy  1981  . Pituitary cyst w/subsequent shunt    . Cyst removal neck      reports that she has never smoked. She has never used smokeless tobacco. She reports that she drinks about 0.6 oz of alcohol per week. She reports that she does not use illicit drugs. family history includes Alzheimer's disease in her mother and paternal grandmother; Brain cancer in her maternal aunt; Colon polyps in her mother; Dementia in her mother; Diabetes in her father; Esophageal cancer in her maternal grandmother; Heart disease in her father; Kidney cancer in her mother; Lung cancer in her maternal uncle, other, and other; Melanoma in her other; Migraines in her mother. There is no history of Colon cancer. Allergies  Allergen Reactions  . Citalopram Hydrobromide     REACTION: low libido   Current Outpatient Prescriptions on  File Prior to Visit  Medication Sig Dispense Refill  . aspirin 81 MG tablet Take 81 mg by mouth daily.    Marland Kitchen atorvastatin (LIPITOR) 20 MG tablet TAKE 1 TABLET (20 MG TOTAL) BY MOUTH DAILY. 90 tablet 3  . Calcium Carbonate-Vitamin D (CALCIUM-VITAMIN D) 500-200 MG-UNIT per tablet Take 1 tablet by mouth 2 (two) times daily with a meal.    . DULoxetine (CYMBALTA) 60 MG capsule Take 1 capsule (60 mg total) by mouth daily. 30 capsule 11  . escitalopram (LEXAPRO) 10 MG tablet TAKE 1 TABLET BY MOUTH EVERY DAY 90 tablet 3  . HYDROcodone-acetaminophen (NORCO/VICODIN) 5-325 MG per tablet Take 1 tablet by mouth 2 (two) times daily as needed for moderate pain or severe pain. Please fill on or after 12/23/14 60 tablet 0  . lamoTRIgine (LAMICTAL) 25 MG tablet One tab po bid  xone week, then 2 tabs po bid (Patient taking differently: Take 50 mg by mouth 2 (two) times daily. One tab po bid xone week, then 2 tabs po bid) 120 tablet 6  . Linaclotide (LINZESS) 145 MCG CAPS capsule Take 1 capsule (145 mcg total) by mouth daily. 30 capsule 2  . LORazepam (ATIVAN) 1 MG tablet Take 1 tablet (1 mg total) by mouth 2 (two) times daily as needed for anxiety. 60 tablet 3  . omega-3 acid ethyl esters (LOVAZA) 1 G capsule TAKE 2 CAPSULES (2 G TOTAL) BY MOUTH 2 (TWO) TIMES DAILY. 360 capsule 1  . ranitidine (ZANTAC) 150 MG tablet Take 1 tablet (150 mg total) by mouth at bedtime. 30 tablet 2  . SUMAtriptan (IMITREX) 100 MG tablet Take 1 tablet (100 mg total) by mouth daily as needed. (Patient not taking: Reported on 01/23/2015) 10 tablet 11   No current facility-administered medications on file prior to visit.   Review of Systems  Musculoskeletal: Positive for back pain, neck pain and neck stiffness.  Psychiatric/Behavioral: Positive for dysphoric mood. Negative for suicidal ideas. The patient is nervous/anxious.     Constitutional: Negative for unexpected weight change, or unusual diaphoresis  HENT: Negative for tinnitus.   Eyes: Negative for photophobia and visual disturbance.  Respiratory: Negative for choking and stridor.   Gastrointestinal: Negative for vomiting and blood in stool.  Genitourinary: Negative for hematuria and decreased urine volume.  Musculoskeletal: Negative for acute joint swelling Skin: Negative for color change and wound.  Neurological: Negative for tremors and numbness other than noted  Neck is tender Psychiatric/Behavioral: Negative for decreased concentration or  hyperactivity.  Depressed     Objective:   Physical Exam BP 146/98 mmHg  Pulse 90  Wt 136 lb (61.689 kg)  SpO2 98%  LMP 12/09/2014 VS noted,  Constitutional: Pt appears well-developed and well-nourished.  HENT: Head: NCAT.  Right Ear: External ear normal.  Left Ear: External ear  normal.  Eyes: Conjunctivae and EOM are normal. Pupils are equal, round, and reactive to light.  Neck: Normal range of motion. Neck supple.  Cardiovascular: Normal rate and regular rhythm.   Pulmonary/Chest: Effort normal and breath sounds normal.  Abd:  Soft, NT, non-distended, + BS Neurological: Pt is alert. Not confused , cn 2-12 intact, motor 5/5, with decr sens to LT to distal LUE, as well as LLE anterior thigh and lateral lower leg Spine tender to mid low c-spine only without red/tenderswelling Has some mild left lumbar paravertebral tender, no red/swelling Tenderness noted in neck, left trapezoid area as well, left shoulder NT, FROM Skin: Skin is warm. No erythema. No  rash Psychiatric: Pt behavior is normal. Thought content normal. not nervous,no obvious depressed affect  R big toe wound  Lab Results  Component Value Date   WBC 5.1 01/08/2015   HGB 9.0* 01/08/2015   HCT 27.9* 01/08/2015   PLT 391.0 01/08/2015   GLUCOSE 112* 08/29/2014   CHOL 250* 07/08/2014   TRIG 115.0 07/08/2014   HDL 42.50 07/08/2014   LDLDIRECT 207.7 05/02/2011   LDLCALC 185* 07/08/2014   ALT 19 07/08/2014   AST 21 07/08/2014   NA 138 08/29/2014   K 3.6 08/29/2014   CL 105 08/29/2014   CREATININE 1.0 08/29/2014   BUN 12 08/29/2014   CO2 25 08/29/2014   TSH 4.05 07/08/2014   HGBA1C 5.8 08/29/2014   Procedure note:  Wound debridement   Indication : a localized necrotic skin and debris in the wound   Risks including unsuccessful procedure , possible need for a repeat procedure, scar formation, and others as well as benefits were explained to the patient in detail. Oral consent was obtained/signed.    The patient was placed in a sitting position. The area around wound was prepped with povidone-iodine and draped in a sterile fashion. Dead skin was removed.  The wound was dressed.  Tolerated well.  Complications: None.  Wound instructions provided.      Assessment & Plan:

## 2015-01-23 NOTE — Assessment & Plan Note (Signed)
R big toe wound 01/22/15 See debridement procedure

## 2015-01-23 NOTE — Assessment & Plan Note (Signed)
Linzess dose was increased to 290 mcg qd

## 2015-01-23 NOTE — Assessment & Plan Note (Signed)
S/p thalamic CVA  Potential benefits of a long term opioids use as well as potential risks (i.e. addiction risk, apnea etc) and complications (i.e. Somnolence, constipation and others) were explained to the patient and were aknowledged. Norco Rx PRN

## 2015-01-23 NOTE — Assessment & Plan Note (Signed)
4/14  S/p thalamic CVA  Potential benefits of a long term opioids use as well as potential risks (i.e. addiction risk, apnea etc) and complications (i.e. Somnolence, constipation and others) were explained to the patient and were aknowledged. Norco Rx PRN

## 2015-01-23 NOTE — Assessment & Plan Note (Signed)
8/15 new: likely due to a GI blood loss Dr Rhea BeltonPyrtle ?capsule endoscopy  Iron infusion 3/16

## 2015-01-26 ENCOUNTER — Telehealth: Payer: Self-pay | Admitting: Internal Medicine

## 2015-01-26 NOTE — Telephone Encounter (Signed)
Spoke with pt and her husband never picked up the updated instructions last time. Discussed with her that she just needs to change the date and the instructions will be the same. Pt verbalized understanding.

## 2015-01-27 ENCOUNTER — Encounter (HOSPITAL_COMMUNITY)
Admission: RE | Admit: 2015-01-27 | Discharge: 2015-01-27 | Disposition: A | Discharge status: Home / Self Care | Payer: BLUE CROSS/BLUE SHIELD | Source: Ambulatory Visit | Attending: Internal Medicine | Admitting: Internal Medicine

## 2015-01-27 ENCOUNTER — Encounter (HOSPITAL_COMMUNITY): Payer: Self-pay

## 2015-01-27 ENCOUNTER — Ambulatory Visit: Payer: BC Managed Care – PPO | Admitting: Nurse Practitioner

## 2015-01-27 VITALS — BP 158/86 | HR 66 | Temp 98.7°F | Resp 18 | Ht <= 58 in | Wt 135.0 lb

## 2015-01-27 DIAGNOSIS — D509 Iron deficiency anemia, unspecified: Secondary | ICD-10-CM | POA: Insufficient documentation

## 2015-01-27 MED ORDER — FERUMOXYTOL INJECTION 510 MG/17 ML: 510.0000 mg | INTRAVENOUS | Status: DC

## 2015-01-27 MED ORDER — SODIUM CHLORIDE 0.9 % IV SOLN
510.0000 mg | INTRAVENOUS | Status: DC
  Filled 2015-01-27: qty 17

## 2015-01-27 MED ORDER — SODIUM CHLORIDE 0.9 % IV SOLN
250.0000 mL | Freq: Once | INTRAVENOUS | Status: AC
  Administered 2015-01-27: 250 mL via INTRAVENOUS

## 2015-01-27 NOTE — Progress Notes (Signed)
Uneventful infusion of #1/2 510 mg Feraheme with a 30 minute observation time post infusion. Pt is scheduled for #2/2 on 02/03/15

## 2015-01-27 NOTE — Discharge Instructions (Signed)
FERAHEME °Ferumoxytol injection °What is this medicine? °FERUMOXYTOL is an iron complex. Iron is used to make healthy red blood cells, which carry oxygen and nutrients throughout the body. This medicine is used to treat iron deficiency anemia in people with chronic kidney disease. °This medicine may be used for other purposes; ask your health care provider or pharmacist if you have questions. °COMMON BRAND NAME(S): Feraheme °What should I tell my health care provider before I take this medicine? °They need to know if you have any of these conditions: °-anemia not caused by low iron levels °-high levels of iron in the blood °-magnetic resonance imaging (MRI) test scheduled °-an unusual or allergic reaction to iron, other medicines, foods, dyes, or preservatives °-pregnant or trying to get pregnant °-breast-feeding °How should I use this medicine? °This medicine is for injection into a vein. It is given by a health care professional in a hospital or clinic setting. °Talk to your pediatrician regarding the use of this medicine in children. Special care may be needed. °Overdosage: If you think you've taken too much of this medicine contact a poison control center or emergency room at once. °Overdosage: If you think you have taken too much of this medicine contact a poison control center or emergency room at once. °NOTE: This medicine is only for you. Do not share this medicine with others. °What if I miss a dose? °It is important not to miss your dose. Call your doctor or health care professional if you are unable to keep an appointment. °What may interact with this medicine? °This medicine may interact with the following medications: °-other iron products °This list may not describe all possible interactions. Give your health care provider a list of all the medicines, herbs, non-prescription drugs, or dietary supplements you use. Also tell them if you smoke, drink alcohol, or use illegal drugs. Some items may interact  with your medicine. °What should I watch for while using this medicine? °Visit your doctor or healthcare professional regularly. Tell your doctor or healthcare professional if your symptoms do not start to get better or if they get worse. You may need blood work done while you are taking this medicine. °You may need to follow a special diet. Talk to your doctor. Foods that contain iron include: whole grains/cereals, dried fruits, beans, or peas, leafy green vegetables, and organ meats (liver, kidney). °What side effects may I notice from receiving this medicine? °Side effects that you should report to your doctor or health care professional as soon as possible: °-allergic reactions like skin rash, itching or hives, swelling of the face, lips, or tongue °-breathing problems °-changes in blood pressure °-feeling faint or lightheaded, falls °-fever or chills °-flushing, sweating, or hot feelings °-swelling of the ankles or feet °Side effects that usually do not require medical attention (Report these to your doctor or health care professional if they continue or are bothersome.): °-diarrhea °-headache °-nausea, vomiting °-stomach pain °This list may not describe all possible side effects. Call your doctor for medical advice about side effects. You may report side effects to FDA at 1-800-FDA-1088. °Where should I keep my medicine? °This drug is given in a hospital or clinic and will not be stored at home. °NOTE: This sheet is a summary. It may not cover all possible information. If you have questions about this medicine, talk to your doctor, pharmacist, or health care provider. °© 2015, [employer]. (2012-06-15 15:23:36 °Anemia, Nonspecific °Anemia is a condition in which the concentration of red blood   cells or hemoglobin in the blood is below normal. Hemoglobin is a substance in red blood cells that carries oxygen to the tissues of the body. Anemia results in not enough oxygen reaching these tissues.    °CAUSES  °Common causes of anemia include:  °· Excessive bleeding. Bleeding may be internal or external. This includes excessive bleeding from periods (in women) or from the intestine.   °· Poor nutrition.   °· Chronic kidney, thyroid, and liver disease.  °· Bone marrow disorders that decrease red blood cell production. °· Cancer and treatments for cancer. °· HIV, AIDS, and their treatments. °· Spleen problems that increase red blood cell destruction. °· Blood disorders. °· Excess destruction of red blood cells due to infection, medicines, and autoimmune disorders. °SIGNS AND SYMPTOMS  °· Minor weakness.   °· Dizziness.   °· Headache. °· Palpitations.   °· Shortness of breath, especially with exercise.   °· Paleness. °· Cold sensitivity. °· Indigestion. °· Nausea. °· Difficulty sleeping. °· Difficulty concentrating. °Symptoms may occur suddenly or they may develop slowly.  °DIAGNOSIS  °Additional blood tests are often needed. These help your health care provider determine the best treatment. Your health care provider will check your stool for blood and look for other causes of blood loss.  °TREATMENT  °Treatment varies depending on the cause of the anemia. Treatment can include:  °· Supplements of iron, vitamin B12, or folic acid.   °· Hormone medicines.   °· A blood transfusion. This may be needed if blood loss is severe.   °· Hospitalization. This may be needed if there is significant continual blood loss.   °· Dietary changes. °· Spleen removal. °HOME CARE INSTRUCTIONS °Keep all follow-up appointments. It often takes many weeks to correct anemia, and having your health care provider check on your condition and your response to treatment is very important. °SEEK IMMEDIATE MEDICAL CARE IF:  °· You develop extreme weakness, shortness of breath, or chest pain.   °· You become dizzy or have trouble concentrating. °· You develop heavy vaginal bleeding.   °· You develop a rash.   °· You have bloody or black, tarry  stools.   °· You faint.   °· You vomit up blood.   °· You vomit repeatedly.   °· You have abdominal pain. °· You have a fever or persistent symptoms for more than 2-3 days.   °· You have a fever and your symptoms suddenly get worse.   °· You are dehydrated.   °MAKE SURE YOU: °· Understand these instructions. °· Will watch your condition. °· Will get help right away if you are not doing well or get worse. °Document Released: 12/08/2004 Document Revised: 07/03/2013 Document Reviewed: 04/26/2013 °[employer]® Patient Information ©2015 [employer]. This information is not intended to replace advice given to you by your health care provider. Make sure you discuss any questions you have with your health care provider. °) ° °

## 2015-01-28 ENCOUNTER — Other Ambulatory Visit: Payer: Self-pay | Admitting: *Deleted

## 2015-01-28 MED ORDER — DULOXETINE HCL 60 MG PO CPEP: 60.0000 mg | ORAL_CAPSULE | Freq: Every day | ORAL | Status: DC

## 2015-01-28 NOTE — Telephone Encounter (Signed)
Patient is scheduled for sleep study 02/10/15 @8 :00PM

## 2015-01-28 NOTE — Telephone Encounter (Signed)
In House Sleep study request review: This patient has an underlying medical history of migraine headaches, hydrocephalus, status post VP shunt, prior diagnosis of obstructive sleep apnea several years ago, and concern for nocturnal seizures for which she is followed by Dr. Terrace ArabiaYan and is referred by Dr. Craige CottaSood for an attended sleep study due to a report of snoring, witnessed apneas, nocturia, excessive daytime somnolence and morning headaches. I will order a split-night sleep study with FULL EEG MONTAGE and send report to Dr. Craige CottaSood. Please print this note and attach to sleep study chart/package.   Sleep Acquisition Technologist instructions: Please score at 3% and split if 2 hour estimated AHI >15/h, unless mandated otherwise by the insurance carrier.    Huston FoleySaima Jenalee Trevizo, MD, PhD [facility])

## 2015-01-29 ENCOUNTER — Telehealth: Payer: Self-pay | Admitting: Internal Medicine

## 2015-01-29 NOTE — Telephone Encounter (Signed)
Pt wanted to know if she could pick up prep instructions. Discussed with pt that they should be up front for her to pick up where she was scheduled before, pt aware.

## 2015-02-03 ENCOUNTER — Encounter (HOSPITAL_COMMUNITY)
Admission: RE | Admit: 2015-02-03 | Discharge: 2015-02-03 | Disposition: A | Discharge status: Home / Self Care | Payer: BLUE CROSS/BLUE SHIELD | Source: Ambulatory Visit | Attending: Internal Medicine | Admitting: Internal Medicine

## 2015-02-03 ENCOUNTER — Encounter (HOSPITAL_COMMUNITY): Payer: Self-pay

## 2015-02-03 ENCOUNTER — Telehealth: Payer: Self-pay | Admitting: Internal Medicine

## 2015-02-03 DIAGNOSIS — D509 Iron deficiency anemia, unspecified: Secondary | ICD-10-CM | POA: Diagnosis not present

## 2015-02-03 MED ORDER — SODIUM CHLORIDE 0.9 % IV SOLN
250.0000 mL | Freq: Once | INTRAVENOUS | Status: AC
  Administered 2015-02-03: 250 mL via INTRAVENOUS

## 2015-02-03 MED ORDER — SODIUM CHLORIDE 0.9 % IV SOLN
510.0000 mg | INTRAVENOUS | Status: DC
  Administered 2015-02-03: 510 mg via INTRAVENOUS
  Filled 2015-02-03: qty 17

## 2015-02-03 NOTE — Progress Notes (Signed)
Uneventful infusion of #2/2 Feraheme 510 mg over 15 minutes with 30 minute suggested observation time.

## 2015-02-03 NOTE — Telephone Encounter (Signed)
Pt called in has had some questions before she has a procedure next week ??     Best number [phone]

## 2015-02-04 ENCOUNTER — Other Ambulatory Visit: Payer: Self-pay | Admitting: Internal Medicine

## 2015-02-05 NOTE — Telephone Encounter (Signed)
Pt had questions about ASA. She states she has already stopped taking it as directed by GI. I advised her that is fine and she can start back taking ASA 81 mg the day after her procedure.

## 2015-02-11 ENCOUNTER — Ambulatory Visit (INDEPENDENT_AMBULATORY_CARE_PROVIDER_SITE_OTHER): Payer: BLUE CROSS/BLUE SHIELD | Admitting: Internal Medicine

## 2015-02-11 DIAGNOSIS — D509 Iron deficiency anemia, unspecified: Secondary | ICD-10-CM

## 2015-02-11 NOTE — Progress Notes (Signed)
Patient here for capsule endoscopy. Tolerated procedure. Verbalizes understanding of written and verbal instructions. Lot #[id number] capsule ID [id number] exp 03/2016

## 2015-02-16 ENCOUNTER — Telehealth: Payer: Self-pay | Admitting: Internal Medicine

## 2015-02-16 NOTE — Telephone Encounter (Signed)
noted 

## 2015-02-24 ENCOUNTER — Encounter: Payer: Self-pay | Admitting: Internal Medicine

## 2015-03-08 ENCOUNTER — Ambulatory Visit (INDEPENDENT_AMBULATORY_CARE_PROVIDER_SITE_OTHER): Payer: BLUE CROSS/BLUE SHIELD | Admitting: Neurology

## 2015-03-08 VITALS — BP 138/77 | HR 96 | Ht <= 58 in | Wt 137.0 lb

## 2015-03-08 DIAGNOSIS — G4720 Circadian rhythm sleep disorder, unspecified type: Secondary | ICD-10-CM

## 2015-03-08 DIAGNOSIS — G4761 Periodic limb movement disorder: Secondary | ICD-10-CM

## 2015-03-08 DIAGNOSIS — G4733 Obstructive sleep apnea (adult) (pediatric): Secondary | ICD-10-CM | POA: Diagnosis not present

## 2015-03-09 ENCOUNTER — Other Ambulatory Visit: Payer: Self-pay

## 2015-03-09 MED ORDER — LAMOTRIGINE 25 MG PO TABS: ORAL_TABLET | ORAL | Status: DC

## 2015-03-09 NOTE — Sleep Study (Signed)
See scanned documents in Encounters tab 

## 2015-03-09 NOTE — Telephone Encounter (Signed)
Pharmacy requests 90 day Rx  

## 2015-03-10 ENCOUNTER — Encounter: Payer: Self-pay | Admitting: Pulmonary Disease

## 2015-03-10 ENCOUNTER — Ambulatory Visit (INDEPENDENT_AMBULATORY_CARE_PROVIDER_SITE_OTHER): Payer: BLUE CROSS/BLUE SHIELD | Admitting: Pulmonary Disease

## 2015-03-10 VITALS — BP 124/86 | HR 95 | Ht <= 58 in | Wt 132.8 lb

## 2015-03-10 DIAGNOSIS — G4733 Obstructive sleep apnea (adult) (pediatric): Secondary | ICD-10-CM

## 2015-03-10 DIAGNOSIS — G40909 Epilepsy, unspecified, not intractable, without status epilepticus: Secondary | ICD-10-CM

## 2015-03-10 NOTE — Progress Notes (Signed)
Chief Complaint  Patient presents with  . Follow-up    Review sleep study    History of Present Illness: Samantha [name] is a 49 y.o. female with sleep problems.  She had her sleep study done on 03/08/15.  Results not available yet.   TESTS:   Past medical hx, Past surgical hx, Medications, Allergies, Family hx, Social hx all reviewed.   Physical Exam: Blood pressure 124/86, pulse 95, height 4\' 10"  (1.473 m), weight 132 lb 12.8 oz (60.238 kg), SpO2 96 %. Body mass index is 27.76 kg/(m^2).   Assessment/Plan:  Will cancel this visit.  I will call her once her sleep study is available.   Coralyn HellingVineet Minsa Weddington, MD [facility] Pager:  [phone]

## 2015-03-18 ENCOUNTER — Telehealth: Payer: Self-pay | Admitting: Neurology

## 2015-03-18 ENCOUNTER — Encounter: Payer: Self-pay | Admitting: Neurology

## 2015-03-18 ENCOUNTER — Ambulatory Visit (INDEPENDENT_AMBULATORY_CARE_PROVIDER_SITE_OTHER): Payer: BLUE CROSS/BLUE SHIELD | Admitting: Neurology

## 2015-03-18 VITALS — BP 130/92 | HR 78 | Ht <= 58 in | Wt 132.0 lb

## 2015-03-18 DIAGNOSIS — R410 Disorientation, unspecified: Secondary | ICD-10-CM

## 2015-03-18 DIAGNOSIS — Z789 Other specified health status: Secondary | ICD-10-CM | POA: Diagnosis not present

## 2015-03-18 DIAGNOSIS — M542 Cervicalgia: Secondary | ICD-10-CM | POA: Diagnosis not present

## 2015-03-18 DIAGNOSIS — G8929 Other chronic pain: Secondary | ICD-10-CM

## 2015-03-18 DIAGNOSIS — G919 Hydrocephalus, unspecified: Secondary | ICD-10-CM

## 2015-03-18 DIAGNOSIS — R413 Other amnesia: Secondary | ICD-10-CM | POA: Diagnosis not present

## 2015-03-18 MED ORDER — LAMOTRIGINE 100 MG PO TABS: 100.0000 mg | ORAL_TABLET | Freq: Two times a day (BID) | ORAL | Status: DC

## 2015-03-18 NOTE — Telephone Encounter (Signed)
Dr. Evlyn CourierSood's patient, and patient also sees Dr. Terrace ArabiaYan here.   Please advise patient that I do apologize for the delay in interpretation of her sleep study. As I understand an appointment with Dr. Craige CottaSood was recently canceled because her sleep study results were not available at the time. Again, I do apologize for this inconvenience. Please advise her that Dr. Craige CottaSood will want to go over the details of her sleep study during her next appointment with him. On the positive side, I did not see any changes on her EEG concerning for seizure activity or abnormal discharges, and overall she has mild obstructive sleep apnea and mild twitching of her legs in sleep.  Please route/fax report to Drs. Craige CottaSood and Terrace ArabiaYan. She can follow up with both her physicians as planned. Thanks.

## 2015-03-18 NOTE — Progress Notes (Signed)
[facility]  PATIENT: Samantha [name] DOB: 1966/07/17  HISTORY OF PRESENT ILLNESS: Samantha [name] 49 -year-old female returns for followup headaches, history of hydrocephalus, neck pain  She was, referred by her primary care physician Dr. Alain [name] for evaluation of acute onset left-sided numbness in June 30th 2014.  She had a history of pituitary cyst, was diagnosed at age 26, she presented with right visual loss, difficulty walking, short statue, she had a right frontal craniotomy at age 87, 6 weeks later, she developed worsening headaches, memory loss, was found to have obstructive hydrocephalus, she had right parietal VP shunt placement.   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  In Mar 27 2013, she got up in the morning, trying to get off the bed, she suddenly felt something pulling in her neck, in the same time, she noticed numbness in her left arm, neck, but sparing left face, she also has some subjective weakness, the dense numbness lasting for 24 hours, much improved now, but she still not back to her normal self she still has intermittent numbness tingling involving the left arm, left leg   MRI of the brain in April 2014 showed moderate enlargement of the third and lateral ventricles, unchanged. There is a right parietal shunt catheter extending into the right lateral ventricle with the tip in the temporal horn. This is unchanged. The fourth ventricle is not dilated. Right frontal craniotomy. Encephalomalacia right frontal lobe related to prior surgery. There is enlargement of the right frontal horn related to volume loss in the right frontal lobe.   MRI cervical showed focal medial foraminal disc protrusion at C5-6. Lateral foraminal disc osteophyte complex on the right at C7- T1.  Electrodiagnostic study was normal, there was no evidence of right  cervical radiculopathy.   She was given prescription of Flexeril, tramadol, without significant improvement ,   She had a history of obstructive sleep sleep apnea, she used to use CPAP machine, but has not had her settings checked for many years, she complains of nighttime snoring, excessive daytime fatigue, sleepiness,   Laboratory Evaluation showed normal B12, TSH, ANA, CMP, CBC, elevated ESR 47 in 2014,   UPDATE Sep 15th 2015:  Last clinical visit was with Samantha [name], over the past few months, she complains of worsening neck pain,  neck pain, upper back pain, She has tried tramadol, Flexeril, without help, is taking Cymbalta, Lexapro, Lyrica, despite polypharmacy, she continued to have significant difficulty, generalized fatigue, bilateral upper and lower extremity heaviness, lack of stamina, frequent headaches, increased nocturia, she also complains of increased confusion, memory trouble, need help from her husband to maintain daily household chores,  Recent laboratory evaluation has demonstrated anemia, hemoglobin was 9.4, which is a change compared to previous 13.5, she is referred to hematologist,  She is in the process of applying for long-term disability, social disability  UPDATE Feb 4th 2016: She is accompanied by her husband, and friends Samantha [name] at today's clinical visit, she continued to have excessive fatigue, frequent neck pain, memory trouble, depression, difficulty focusing.  She has stopped working since 2014, following her recurrent episode of transient left-sided paresthesia, she continually had similar recurrent episodes, sometimes with right facial twitching, mild confusion, no loss of consciousness, no seizure-like event,  I have reviewed MRI of the brain in 2011, in comparison in 2014, and also MRI cervical spine with patient, and her family,  reported detailed above, there was no significant change in the size of her hydrocephalus in repeat MRI of brain with, overall, her  headache has slight improvement, especially since she quit working in 2014, with less stress, she only taking Imitrex occasionally for severe typical migraine headaches, which works well for her, she also showed improvement in her concentration, mood disorder, fatigue, after stopped working,   UPDATE May 4th 2016: She is accompanied by her friend at today's visit, she complains of generalized weakness, sometimes drop things from her hands, she is tolerating lamotrigine 25 mg 2 tablets twice a day well, she has much less episode of right mouth corner twitching,  EEG was normal in January 16 2015, had a sleep study, reports pending, this will followed up by her pulmonologist Dr. Tomma [name], who has managed her CPAP in the past  REVIEW OF SYSTEMS: Full 14 system review of systems performed and notable only for those listed, all others are neg: Decreased activity, fatigue, constipation, daytime sleepiness, snoring, back pain, walking difficulty, neck pain, neck stiffness, memory loss, weakness, facial drooping, confusion, depression, anxiety.    ALLERGIES: Allergies  Allergen Reactions  . Citalopram Hydrobromide     REACTION: low libido    HOME MEDICATIONS: Outpatient Prescriptions Prior to Visit  Medication Sig Dispense Refill  . aspirin 81 MG tablet Take 81 mg by mouth daily.    Marland Kitchen atorvastatin (LIPITOR) 20 MG tablet TAKE 1 TABLET (20 MG TOTAL) BY MOUTH DAILY. 90 tablet 3  . Calcium Carbonate-Vitamin D (CALCIUM-VITAMIN D) 500-200 MG-UNIT per tablet Take 1 tablet by mouth 2 (two) times daily with a meal.    . DULoxetine (CYMBALTA) 60 MG capsule Take 1 capsule (60 mg total) by mouth daily. 90 capsule 3  . escitalopram (LEXAPRO) 10 MG tablet TAKE 1 TABLET BY MOUTH EVERY DAY 90 tablet 3  . HYDROcodone-acetaminophen (NORCO/VICODIN) 5-325 MG per tablet Take 1 tablet by mouth 2 (two) times daily as needed for moderate pain or severe pain. Please fill on or after 03/25/15 60 tablet 0  . lamoTRIgine (LAMICTAL) 25  MG tablet One tab po bid xone week, then 2 tabs po bid 360 tablet 1  . Linaclotide (LINZESS) 290 MCG CAPS capsule Take 1 capsule (290 mcg total) by mouth daily. 30 capsule 11  . LORazepam (ATIVAN) 1 MG tablet Take 1 tablet (1 mg total) by mouth 2 (two) times daily as needed for anxiety. 60 tablet 3  . omega-3 acid ethyl esters (LOVAZA) 1 G capsule TAKE 2 CAPSULES (2 G TOTAL) BY MOUTH 2 (TWO) TIMES DAILY. 360 capsule 1  . ranitidine (ZANTAC) 150 MG tablet Take 1 tablet (150 mg total) by mouth at bedtime. 30 tablet 2  . SUMAtriptan (IMITREX) 100 MG tablet Take 1 tablet (100 mg total) by mouth daily as needed. 10 tablet 11   No facility-administered medications prior to visit.    PAST MEDICAL HISTORY: Past Medical History  Diagnosis Date  . Migraine   . Hyperlipidemia   . Cerebral ventricular shunt fitting or adjustment   . Anxiety   . Depression   . COMMON MIGRAINE 10/01/2007    Chronic    . GLUCOSE INTOLERANCE 10/01/2007    Qualifier: Diagnosis of  By: Jenny Reichmann MD, Hunt Oris   . HYPERLIPIDEMIA 10/01/2007    Chronic. Lovaza is too $$$ Declined statins due to worries   . OBSTRUCTIVE SLEEP APNEA 10/01/2007    NPSG 2006:  AHI 9/hr Started cpap 2009 successfully.     . Paresthesia   .  Sleep apnea   . Anemia   . GERD (gastroesophageal reflux disease)   . Stroke 03/2013  . Memory loss   . Hiatal hernia   . Schatzki's ring   . Hydrocephalus   . IDA (iron deficiency anemia)     PAST SURGICAL HISTORY: Past Surgical History  Procedure Laterality Date  . Cholecystectomy  1981  . Pituitary cyst w/subsequent shunt    . Cyst removal neck    . [place] shunt  1976,    cerebral vascular shunt/ with a revision 1981    FAMILY HISTORY: Family History  Problem Relation Age of Onset  . Dementia Mother   . Kidney cancer Mother   . Alzheimer's disease Mother   . Migraines Mother   . Colon polyps Mother   . Heart disease Father   . Diabetes Father   . Melanoma Other   . Lung cancer Other   .  Lung cancer Other   . Colon cancer Neg Hx   . Brain cancer Maternal Aunt   . Lung cancer Maternal Uncle   . Esophageal cancer Maternal Grandmother   . Alzheimer's disease Paternal Grandmother     SOCIAL HISTORY: History   Social History  . Marital Status: Married    Spouse Name: Samantha [name]  . Number of Children: 0  . Years of Education: 12   Occupational History  . [employer]  . disabled    Social History Main Topics  . Smoking status: Never Smoker   . Smokeless tobacco: Never Used  . Alcohol Use: 0.6 oz/week    1 Glasses of wine per week     Comment: Social  . Drug Use: No  . Sexual Activity: Yes   Other Topics Concern  . Not on file   Social History Narrative   Mother is in a NH.    Patient lives at home with her husband Samantha [name]). 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    High school education.   Right handed.   Caffeine 1 cup daily.   She has no children     PHYSICAL EXAM  Filed Vitals:   03/18/15 1130  BP: 130/92  Pulse: 78  Height: 4' 10"  (1.473 m)  Weight: 132 lb (59.875 kg)   Body mass index is 27.6 kg/(m^2). PHYSICAL EXAMNIATION:  Gen: NAD, conversant, well nourised, obese, well groomed                     Cardiovascular: Regular rate rhythm, no peripheral edema, warm, nontender. Eyes: Conjunctivae clear without exudates or hemorrhage Neck: Supple, no carotid bruise. Pulmonary: Clear to auscultation bilaterally   NEUROLOGICAL EXAM:  MENTAL STATUS: Speech:    Speech is normal; fluent and spontaneous with normal comprehension.  Cognition:    The patient is oriented to person, place, and time;     recent and remote memory intact;     language fluent;     normal attention, concentration,     fund of knowledge.  CRANIAL NERVES: CN II: Visual fields are full to confrontation. Fundoscopic exam is normal with sharp discs and no vascular changes. Venous pulsations are present bilaterally. Pupils are 4 mm and briskly  reactive to light. Visual acuity is 20/20 bilaterally. CN III, IV, VI: extraocular movement are normal. No ptosis. CN V: Facial sensation is intact to pinprick in all 3 divisions bilaterally. Corneal responses are intact.  CN VII: Face is symmetric with normal eye closure  and smile. CN VIII: Hearing is normal to rubbing fingers CN IX, X: Palate elevates symmetrically. Phonation is normal. CN XI: Head turning and shoulder shrug are intact CN XII: Tongue is midline with normal movements and no atrophy.  MOTOR: There is no pronator drift of out-stretched arms. Muscle bulk and tone are normal. Muscle strength is normal.   Shoulder abduction Shoulder external rotation Elbow flexion Elbow extension Wrist flexion Wrist extension Finger abduction Hip flexion Knee flexion Knee extension Ankle dorsi flexion Ankle plantar flexion  R 5 5 5 5 5 5 5 5 5 5 5 5   L 5 5 5 5 5 5 5 5 5 5 5 5     REFLEXES: Reflexes are 2+ and symmetric at the biceps, triceps, knees, and ankles. Plantar responses are flexor.  SENSORY: Light touch, pinprick, position sense, and vibration sense are intact in fingers and toes.  COORDINATION: Rapid alternating movements and fine finger movements are intact. There is no dysmetria on finger-to-nose and heel-knee-shin. There are no abnormal or extraneous movements.   GAIT/STANCE: Posture is normal. Gait is steady with normal steps, base, arm swing, and turning. Heel and toe walking are normal. Tandem gait is normal.  Romberg is absent.   DIAGNOSTIC DATA (LABS, IMAGING, TESTING) - I reviewed patient records, labs, notes, testing and imaging myself where available.  Lab Results  Component Value Date   WBC 5.1 01/08/2015   HGB 9.0* 01/08/2015   HCT 27.9* 01/08/2015   MCV 68.6 Repeated and verified X2.* 01/08/2015   PLT 391.0 01/08/2015      Component Value Date/Time   NA 138 08/29/2014 1120   K 3.6 08/29/2014 1120   CL 105 08/29/2014 1120   CO2 25 08/29/2014 1120    GLUCOSE 112* 08/29/2014 1120   GLUCOSE 108* 11/22/2006 0728   BUN 12 08/29/2014 1120   CREATININE 1.0 08/29/2014 1120   CALCIUM 9.8 08/29/2014 1120   PROT 7.4 07/08/2014 0825   ALBUMIN 3.5 07/08/2014 0825   AST 21 07/08/2014 0825   ALT 19 07/08/2014 0825   ALKPHOS 45 07/08/2014 0825   BILITOT 0.3 07/08/2014 0825   GFRNONAA 76.46 06/29/2010 1025   Lab Results  Component Value Date   CHOL 250* 07/08/2014   HDL 42.50 07/08/2014   LDLCALC 185* 07/08/2014   LDLDIRECT 207.7 05/02/2011   TRIG 115.0 07/08/2014   CHOLHDL 6 07/08/2014   Lab Results  Component Value Date   HGBA1C 5.8 08/29/2014   Lab Results  Component Value Date   VITAMINB12 335 07/08/2014   Lab Results  Component Value Date   TSH 4.05 07/08/2014    ASSESSMENT AND PLAN  49 y.o. year old female  has a past medical history of Migraine; and history of pituitary cyst status post craniotomy, hydrocephalus, right VP shunt placement. History of left-sided numbness. Now presenting with worsening memory trouble, fatigue, lack of stamina, gait difficulty, persistent neck pain, headaches, upper back pain. significant abnormality on MRI, enlargement of ventricular system, right frontal encephalomalacia, evidence of right frontal craniotomy, VP shunt, tip in the right temporal horn  1. she has recurrent episode of right facial twitching, transient confusion, could indicate a complex partial seizure, EEG, responded to Lamictal, titrating to 25 mg 2 tablets twice a day. She has less spells, tolerating Lamictal 61m bid well, I will increase it to Lamotrigine 1015mbid 2. Follow up on sleep study, I will call her report, and forward report to Dr. SoTomma [name].  RTC in 6 months  Jethro Radke  Krista Blue, M.D. Ph.D.  [facility] [place], [place] [address] Phone: [phone] Fax:      [phone]

## 2015-03-19 NOTE — Telephone Encounter (Signed)
I spoke with patient. Patient is aware of results and will f/u with Dr. Craige CottaSood. I will fax the report to him and also give copy to Dr. Terrace ArabiaYan.

## 2015-03-30 ENCOUNTER — Telehealth: Payer: Self-pay | Admitting: Pulmonary Disease

## 2015-03-30 DIAGNOSIS — G4733 Obstructive sleep apnea (adult) (pediatric): Secondary | ICD-10-CM

## 2015-03-30 NOTE — Telephone Encounter (Signed)
PSG 03/08/15 >> AHI 12.1, SpO2 low 91%  Will have my nurse inform pt that sleep study shows mild sleep apnea.  Options at this time are 1) auto CPAP set and then ROV, or 2) ROV first.  If pt okay with auto CPAP, then please send order for auto CPAP range 5 to 15 cm H2O with heated humidity and mask of choice.  Have download sent after 1 month and schedule ROV after 2 months.

## 2015-03-31 ENCOUNTER — Institutional Professional Consult (permissible substitution): Payer: BLUE CROSS/BLUE SHIELD | Admitting: Neurology

## 2015-04-01 NOTE — Telephone Encounter (Signed)
Results have been explained to patient, pt expressed understanding.  Order placed for CPAP start. 2 month recall entered as patient could not schedule appt for being out of town later in July.  Nothing further needed.

## 2015-04-09 ENCOUNTER — Other Ambulatory Visit (INDEPENDENT_AMBULATORY_CARE_PROVIDER_SITE_OTHER): Payer: BLUE CROSS/BLUE SHIELD

## 2015-04-09 DIAGNOSIS — D509 Iron deficiency anemia, unspecified: Secondary | ICD-10-CM | POA: Diagnosis not present

## 2015-04-09 LAB — CBC WITH DIFFERENTIAL/PLATELET
Basophils Absolute: 0 10*3/uL (ref 0.0–0.1)
Basophils Relative: 0.5 % (ref 0.0–3.0)
Eosinophils Absolute: 0.2 10*3/uL (ref 0.0–0.7)
Eosinophils Relative: 3 % (ref 0.0–5.0)
HCT: 39.8 % (ref 36.0–46.0)
Hemoglobin: 13.5 g/dL (ref 12.0–15.0)
Lymphocytes Relative: 33.1 % (ref 12.0–46.0)
Lymphs Abs: 2.7 10*3/uL (ref 0.7–4.0)
MCHC: 33.8 g/dL (ref 30.0–36.0)
MCV: 87.6 fl (ref 78.0–100.0)
Monocytes Absolute: 0.6 10*3/uL (ref 0.1–1.0)
Monocytes Relative: 7.1 % (ref 3.0–12.0)
Neutro Abs: 4.6 10*3/uL (ref 1.4–7.7)
Neutrophils Relative %: 56.3 % (ref 43.0–77.0)
Platelets: 496 10*3/uL — ABNORMAL HIGH (ref 150.0–400.0)
RBC: 4.55 Mil/uL (ref 3.87–5.11)
RDW: 24.6 % — ABNORMAL HIGH (ref 11.5–15.5)
WBC: 8.2 10*3/uL (ref 4.0–10.5)

## 2015-04-09 LAB — IBC PANEL
Iron: 113 ug/dL (ref 42–145)
Saturation Ratios: 34.5 % (ref 20.0–50.0)
Transferrin: 234 mg/dL (ref 212.0–360.0)

## 2015-04-09 LAB — FERRITIN: Ferritin: 55.6 ng/mL (ref 10.0–291.0)

## 2015-04-14 ENCOUNTER — Other Ambulatory Visit: Payer: Self-pay

## 2015-04-14 DIAGNOSIS — D509 Iron deficiency anemia, unspecified: Secondary | ICD-10-CM

## 2015-04-27 ENCOUNTER — Encounter: Payer: Self-pay | Admitting: Internal Medicine

## 2015-04-27 ENCOUNTER — Ambulatory Visit (INDEPENDENT_AMBULATORY_CARE_PROVIDER_SITE_OTHER): Payer: BLUE CROSS/BLUE SHIELD | Admitting: Internal Medicine

## 2015-04-27 VITALS — BP 140/92 | HR 75 | Wt 133.0 lb

## 2015-04-27 DIAGNOSIS — D509 Iron deficiency anemia, unspecified: Secondary | ICD-10-CM

## 2015-04-27 DIAGNOSIS — Z0000 Encounter for general adult medical examination without abnormal findings: Secondary | ICD-10-CM

## 2015-04-27 DIAGNOSIS — G4733 Obstructive sleep apnea (adult) (pediatric): Secondary | ICD-10-CM

## 2015-04-27 DIAGNOSIS — R413 Other amnesia: Secondary | ICD-10-CM | POA: Diagnosis not present

## 2015-04-27 DIAGNOSIS — G919 Hydrocephalus, unspecified: Secondary | ICD-10-CM | POA: Diagnosis not present

## 2015-04-27 MED ORDER — HYDROCODONE-ACETAMINOPHEN 5-325 MG PO TABS: 1.0000 | ORAL_TABLET | Freq: Two times a day (BID) | ORAL | Status: DC | PRN

## 2015-04-27 MED ORDER — PHOSPHATIDYLSERINE-DHA-EPA 100-19.5-6.5 MG PO CAPS: 1.0000 | ORAL_CAPSULE | Freq: Every day | ORAL | Status: DC

## 2015-04-27 MED ORDER — RANITIDINE HCL 150 MG PO TABS: 150.0000 mg | ORAL_TABLET | Freq: Every day | ORAL | Status: DC

## 2015-04-27 MED ORDER — LORAZEPAM 1 MG PO TABS: 1.0000 mg | ORAL_TABLET | Freq: Two times a day (BID) | ORAL | Status: DC | PRN

## 2015-04-27 NOTE — Assessment & Plan Note (Signed)
Starting CPAP next week

## 2015-04-27 NOTE — Progress Notes (Signed)
Pre visit review using our clinic review tool, if applicable. No additional management support is needed unless otherwise documented below in the visit note. 

## 2015-04-27 NOTE — Assessment & Plan Note (Signed)
Resolved S/p GI work up

## 2015-04-27 NOTE — Assessment & Plan Note (Addendum)
2015 ?etiology short term memory loss Started before meds per pt Start Vyacog and f/u w/Dr Terrace Arabia Hold Lipitor

## 2015-04-27 NOTE — Assessment & Plan Note (Signed)
F/u w/Dr Yan 

## 2015-04-27 NOTE — Progress Notes (Signed)
Subjective:    HPI     F/u fatigue - is planning to start CPAP soon F/u anemia - off iron per Dr Rhea Belton 6 mo ago F/u facial twitching - on Lamictal, Lamotrigine at night F/u L side pain - (back, leg, arm) hurts a lot - tramadol not working; pain is 5/10 on Norco only  F/u GERD. C/o still being depressed, tired  She was able to get Lyrica, Cymbalta - PA.  F/u worsening lower neck pain, worse on the left, with pain to left upper back/shoulder and numbness to left arm extending distally, similar in area that seemed to start months ago in bed when she stretched and reached for an object on the bedside table and felt a pop and pain lasting several days and seemed better until the return worse than ever now mod to severe;  No LUE weakness or loss of grip strength.  Also mentions similar left lower back pain with LLE numbness as well but much less severe pain, mild only, with LLE numbness but no weakness.  No HA and Pt denies new neurological symptoms such as new headache, or facial or extremity weakness as above.  No prior hx of CVA. C/o worsening depressive symptoms, suicidal ideation, or panic; has ongoing anxiety.  H/o MS   Dr Terrace Arabia: sx's suggestive of a right internal capsule thalamus small vessel disease, which can be missed by MRI scan, MRI of the brain showed stable chronic changes detailed above, there was no acute lesions  She had a sleep study last night   Wt Readings from Last 3 Encounters:  04/27/15 133 lb (60.328 kg)  03/18/15 132 lb (59.875 kg)  03/10/15 132 lb 12.8 oz (60.238 kg)   BP Readings from Last 3 Encounters:  04/27/15 140/92  03/18/15 130/92  03/10/15 124/86      Past Medical History  Diagnosis Date  . Migraine   . Hyperlipidemia   . Cerebral ventricular shunt fitting or adjustment   . Anxiety   . Depression   . COMMON MIGRAINE 10/01/2007    Chronic    . GLUCOSE INTOLERANCE 10/01/2007    Qualifier: Diagnosis of  By: Jonny Ruiz MD, Len Blalock   . HYPERLIPIDEMIA  10/01/2007    Chronic. Lovaza is too $$$ Declined statins due to worries   . OBSTRUCTIVE SLEEP APNEA 10/01/2007    NPSG 2006:  AHI 9/hr Started cpap 2009 successfully.     . Paresthesia   . Sleep apnea   . Anemia   . GERD (gastroesophageal reflux disease)   . Stroke 03/2013  . Memory loss   . Hiatal hernia   . Schatzki's ring   . Hydrocephalus   . IDA (iron deficiency anemia)    Past Surgical History  Procedure Laterality Date  . Cholecystectomy  1981  . Pituitary cyst w/subsequent shunt    . Cyst removal neck    . [place] shunt  1976,    cerebral vascular shunt/ with a revision 1981    reports that she has never smoked. She has never used smokeless tobacco. She reports that she drinks about 0.6 oz of alcohol per week. She reports that she does not use illicit drugs. family history includes Alzheimer's disease in her mother and paternal grandmother; Brain cancer in her maternal aunt; Colon polyps in her mother; Dementia in her mother; Diabetes in her father; Esophageal cancer in her maternal grandmother; Heart disease in her father; Kidney cancer in her mother; Lung cancer in her maternal uncle, other,  and other; Melanoma in her other; Migraines in her mother. There is no history of Colon cancer. Allergies  Allergen Reactions  . Citalopram Hydrobromide     REACTION: low libido   Current Outpatient Prescriptions on File Prior to Visit  Medication Sig Dispense Refill  . aspirin 81 MG tablet Take 81 mg by mouth daily.    Marland Kitchen atorvastatin (LIPITOR) 20 MG tablet TAKE 1 TABLET (20 MG TOTAL) BY MOUTH DAILY. 90 tablet 3  . Calcium Carbonate-Vitamin D (CALCIUM-VITAMIN D) 500-200 MG-UNIT per tablet Take 1 tablet by mouth 2 (two) times daily with a meal.    . DULoxetine (CYMBALTA) 60 MG capsule Take 1 capsule (60 mg total) by mouth daily. 90 capsule 3  . escitalopram (LEXAPRO) 10 MG tablet TAKE 1 TABLET BY MOUTH EVERY DAY 90 tablet 3  . HYDROcodone-acetaminophen (NORCO/VICODIN) 5-325 MG per  tablet Take 1 tablet by mouth 2 (two) times daily as needed for moderate pain or severe pain. Please fill on or after 03/25/15 60 tablet 0  . lamoTRIgine (LAMICTAL) 100 MG tablet Take 1 tablet (100 mg total) by mouth 2 (two) times daily. 60 tablet 11  . Linaclotide (LINZESS) 290 MCG CAPS capsule Take 1 capsule (290 mcg total) by mouth daily. 30 capsule 11  . LORazepam (ATIVAN) 1 MG tablet Take 1 tablet (1 mg total) by mouth 2 (two) times daily as needed for anxiety. 60 tablet 3  . omega-3 acid ethyl esters (LOVAZA) 1 G capsule TAKE 2 CAPSULES (2 G TOTAL) BY MOUTH 2 (TWO) TIMES DAILY. 360 capsule 1  . ranitidine (ZANTAC) 150 MG tablet Take 1 tablet (150 mg total) by mouth at bedtime. 30 tablet 2  . SUMAtriptan (IMITREX) 100 MG tablet Take 1 tablet (100 mg total) by mouth daily as needed. 10 tablet 11  . lamoTRIgine (LAMICTAL) 25 MG tablet One tab po bid xone week, then 2 tabs po bid (Patient not taking: Reported on 04/27/2015) 360 tablet 1   No current facility-administered medications on file prior to visit.   Review of Systems  Musculoskeletal: Positive for back pain, neck pain and neck stiffness.  Psychiatric/Behavioral: Positive for dysphoric mood. Negative for suicidal ideas. The patient is nervous/anxious.     Constitutional: Negative for unexpected weight change, or unusual diaphoresis  HENT: Negative for tinnitus.   Eyes: Negative for photophobia and visual disturbance.  Respiratory: Negative for choking and stridor.   Gastrointestinal: Negative for vomiting and blood in stool.  Genitourinary: Negative for hematuria and decreased urine volume.  Musculoskeletal: Negative for acute joint swelling Skin: Negative for color change and wound.  Neurological: Negative for tremors and numbness other than noted  Neck is tender Psychiatric/Behavioral: Negative for decreased concentration or  hyperactivity.  Depressed     Objective:   Physical Exam BP 140/92 mmHg  Pulse 75  Wt 133 lb (60.328  kg)  SpO2 96% VS noted,  Constitutional: Pt appears well-developed and well-nourished.  HENT: Head: NCAT.  Right Ear: External ear normal.  Left Ear: External ear normal.  Eyes: Conjunctivae and EOM are normal. Pupils are equal, round, and reactive to light.  Neck: Normal range of motion. Neck supple.  Cardiovascular: Normal rate and regular rhythm.   Pulmonary/Chest: Effort normal and breath sounds normal.  Abd:  Soft, NT, non-distended, + BS Neurological: Pt is alert. Not confused , cn 2-12 intact, motor 5/5, with decr sens to LT to distal LUE, as well as LLE anterior thigh and lateral lower leg Spine tender to mid  low c-spine only without red/tenderswelling Has some mild left lumbar paravertebral tender, no red/swelling Tenderness noted in neck, left trapezoid area as well, left shoulder NT, FROM Skin: Skin is warm. No erythema. No rash Psychiatric: Pt behavior is normal. Thought content normal. not nervous,no obvious depressed affect  Pt stumbles on "76-7" Recalls 1/3   Lab Results  Component Value Date   WBC 8.2 04/09/2015   HGB 13.5 04/09/2015   HCT 39.8 04/09/2015   PLT 496.0* 04/09/2015   GLUCOSE 112* 08/29/2014   CHOL 250* 07/08/2014   TRIG 115.0 07/08/2014   HDL 42.50 07/08/2014   LDLDIRECT 207.7 05/02/2011   LDLCALC 185* 07/08/2014   ALT 19 07/08/2014   AST 21 07/08/2014   NA 138 08/29/2014   K 3.6 08/29/2014   CL 105 08/29/2014   CREATININE 1.0 08/29/2014   BUN 12 08/29/2014   CO2 25 08/29/2014   TSH 4.05 07/08/2014   HGBA1C 5.8 08/29/2014        Assessment & Plan:

## 2015-05-12 ENCOUNTER — Other Ambulatory Visit: Payer: Self-pay | Admitting: Internal Medicine

## 2015-05-19 ENCOUNTER — Other Ambulatory Visit: Payer: Self-pay

## 2015-05-19 MED ORDER — RANITIDINE HCL 150 MG PO TABS: 150.0000 mg | ORAL_TABLET | Freq: Every day | ORAL | Status: DC

## 2015-05-21 ENCOUNTER — Other Ambulatory Visit: Payer: Self-pay | Admitting: Internal Medicine

## 2015-05-22 ENCOUNTER — Other Ambulatory Visit: Payer: Self-pay | Admitting: Internal Medicine

## 2015-06-22 ENCOUNTER — Ambulatory Visit (INDEPENDENT_AMBULATORY_CARE_PROVIDER_SITE_OTHER): Payer: BLUE CROSS/BLUE SHIELD | Admitting: Neurology

## 2015-06-22 ENCOUNTER — Encounter: Payer: Self-pay | Admitting: Neurology

## 2015-06-22 ENCOUNTER — Telehealth: Payer: Self-pay | Admitting: Neurology

## 2015-06-22 VITALS — BP 130/90 | HR 84 | Ht <= 58 in | Wt 134.0 lb

## 2015-06-22 DIAGNOSIS — G919 Hydrocephalus, unspecified: Secondary | ICD-10-CM

## 2015-06-22 DIAGNOSIS — R569 Unspecified convulsions: Secondary | ICD-10-CM

## 2015-06-22 DIAGNOSIS — R269 Unspecified abnormalities of gait and mobility: Secondary | ICD-10-CM

## 2015-06-22 MED ORDER — LAMOTRIGINE 100 MG PO TABS: ORAL_TABLET | ORAL | Status: DC

## 2015-06-22 NOTE — Telephone Encounter (Signed)
Please fax today's note to her attorney Lyman Bishop, attention Ricardo.

## 2015-06-22 NOTE — Progress Notes (Signed)
Chief Complaint  Patient presents with  . Hydrocephalus    She is reporting an event on 06/20/15 where she noticed her mouth drawing to the left and an unsteady gait.  Symptoms have improved but she is concerned because she is still very fatigued.    [facility]  PATIENT: Samantha [name] DOB: July 10, 1966  HISTORY OF PRESENT ILLNESS: Samantha [name] 49 -year-old female returns for followup headaches, history of hydrocephalus, neck pain  She was referred by her primary care physician Dr. Alain Marion for evaluation of acute onset left-sided numbness in June 30th 2014.  She had a history of pituitary cyst, was diagnosed at age 12, she presented with right visual loss, difficulty walking, short statue, she had a right frontal craniotomy at age 49,   she developed worsening headaches few weeks later with memory loss, was found to have obstructive hydrocephalus, she had right parietal VP shunt placement.   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  In Mar 27 2013, she got up in the morning, trying to get off the bed, she suddenly felt something pulling in her neck, in the same time, she noticed numbness in her left arm, neck, but sparing left face, she also has some subjective weakness, the dense numbness lasting for 24 hours, much improved now, but she still not back to her normal self she still has intermittent numbness tingling involving the left arm, left leg   MRI of the brain in April 2014 showed moderate enlargement of the third and lateral ventricles, unchanged. There is a right parietal shunt catheter extending into the right lateral ventricle with the tip in the temporal horn. This is unchanged. The fourth ventricle is not dilated. Right frontal craniotomy. Encephalomalacia right frontal lobe related to prior surgery. There is enlargement of the right frontal horn  related to volume loss in the right frontal lobe.   MRI cervical showed focal medial foraminal disc protrusion at C5-6. Lateral foraminal disc osteophyte complex on the right at C7- T1.  Electrodiagnostic study was normal, there was no evidence of right cervical radiculopathy.   She had a history of obstructive sleep sleep apnea, she used to use CPAP machine, but has not had her settings checked for many years, she complains of nighttime snoring, excessive daytime fatigue, sleepiness,   Laboratory Evaluation showed normal B12, TSH, ANA, CMP, CBC, elevated ESR 47 in 2014,   UPDATE Sep 15th 2015:  Last clinical visit was with Samantha [name], over the past few months, she complains of worsening neck pain,  neck pain, upper back pain, She has tried tramadol, Flexeril, without help, is taking Cymbalta, Lexapro, Lyrica, despite polypharmacy, she continued to have significant difficulty, generalized fatigue, bilateral upper and lower extremity heaviness, lack of stamina, frequent headaches, increased nocturia, she also complains of increased confusion, memory trouble, need help from her husband to maintain daily household chores,  Recent laboratory evaluation has demonstrated anemia, hemoglobin was 9.4, which is a change compared to previous 13.5, she is referred to hematologist,  She is in the process of applying for long-term disability, social disability  UPDATE Feb 4th 2016: She is accompanied by her husband, and friends Hinton Dyer at today's clinical visit, she continued to have excessive fatigue, frequent neck pain, memory trouble, depression, difficulty focusing.  She has stopped working since 2014, following her recurrent episode of transient left-sided paresthesia, she continually had similar  recurrent episodes, sometimes with right facial twitching, mild confusion, no loss of consciousness, no seizure-like event,  UPDATE May 4th 2016: She is accompanied by her friend at today's visit, she complains of  generalized weakness, sometimes drop things from her hands, she is tolerating lamotrigine 25 mg 2 tablets twice a day well, she has much less episode of right mouth corner twitching.  UPDATE August 8th 2016; Patient came earlier than expected today, In August 4th 2016, she noticed sudden onset right mouth twitching, lasted for 2 minutes, no LOC, there was no involvement in her arms or legs She also reported worsening memory trouble, get confused easily, made wrong turn to my clinic today, was late for the appointment. She had sleep study consistent with obstructive sleep apnea, but could not afford the co-pay for her CPAP machine. She also complains of worsening gait difficulty, tends to drag her left leg more. She has frequent headaches, yesterday headache lasted for 1 day, no blurry vision,  I have reviewed MRI of the brain in 2014 with patient,Moderate enlargement of the third and lateral ventricle, right parietal shunt catheter extending to the right lateral ventricle, with the tip in the temporal home, the fourth ventricle is not dilated, evidence of right frontal craniectomy , Encephalomalacia at the right frontal lobe, enlargement of the right frontal horn.  MRI of cervical spine: focal medial foraminal disc protrusion at C5-6.  Lateral foraminal disc osteophyte complex on the right at C7- T1.   REVIEW OF SYSTEMS: Full 14 system review of systems performed and notable only for those listed, as above.   ALLERGIES: Allergies  Allergen Reactions  . Citalopram Hydrobromide     REACTION: low libido    HOME MEDICATIONS: Outpatient Prescriptions Prior to Visit  Medication Sig Dispense Refill  . aspirin 81 MG tablet Take 81 mg by mouth daily.    Marland Kitchen atorvastatin (LIPITOR) 20 MG tablet TAKE 1 TABLET (20 MG TOTAL) BY MOUTH DAILY. 90 tablet 3  . Calcium Carbonate-Vitamin D (CALCIUM-VITAMIN D) 500-200 MG-UNIT per tablet Take 1 tablet by mouth 2 (two) times daily with a meal.    . DULoxetine  (CYMBALTA) 60 MG capsule Take 1 capsule (60 mg total) by mouth daily. 90 capsule 3  . escitalopram (LEXAPRO) 10 MG tablet TAKE 1 TABLET BY MOUTH EVERY DAY 90 tablet 3  . HYDROcodone-acetaminophen (NORCO/VICODIN) 5-325 MG per tablet Take 1 tablet by mouth 2 (two) times daily as needed for moderate pain or severe pain. Please fill on or after 06/27/15 60 tablet 0  . lamoTRIgine (LAMICTAL) 100 MG tablet Take 1 tablet (100 mg total) by mouth 2 (two) times daily. 60 tablet 11  . LINZESS 145 MCG CAPS capsule TAKE 1 CAPSULE (145 MCG TOTAL) BY MOUTH DAILY. 30 capsule 2  . LORazepam (ATIVAN) 1 MG tablet Take 1 tablet (1 mg total) by mouth 2 (two) times daily as needed for anxiety. 60 tablet 3  . omega-3 acid ethyl esters (LOVAZA) 1 G capsule TAKE 2 CAPSULES (2 G TOTAL) BY MOUTH 2 (TWO) TIMES DAILY. 360 capsule 3  . Phosphatidylserine-DHA-EPA (VAYACOG) 100-19.5-6.5 MG CAPS Take 1 capsule by mouth daily. 30 capsule 11  . ranitidine (ZANTAC) 150 MG tablet Take 1 tablet (150 mg total) by mouth at bedtime. 90 tablet 0  . SUMAtriptan (IMITREX) 100 MG tablet Take 1 tablet (100 mg total) by mouth daily as needed. 10 tablet 11  . lamoTRIgine (LAMICTAL) 25 MG tablet One tab po bid xone week, then 2 tabs po  bid 360 tablet 1  . Linaclotide (LINZESS) 290 MCG CAPS capsule Take 1 capsule (290 mcg total) by mouth daily. 30 capsule 11  . traMADol (ULTRAM) 50 MG tablet TAKE 1 OR 2 TABLETS BY MOUTH TWICE A DAY AS NEEDED 100 tablet 3   No facility-administered medications prior to visit.    PAST MEDICAL HISTORY: Past Medical History  Diagnosis Date  . Migraine   . Hyperlipidemia   . Cerebral ventricular shunt fitting or adjustment   . Anxiety   . Depression   . COMMON MIGRAINE 10/01/2007    Chronic    . GLUCOSE INTOLERANCE 10/01/2007    Qualifier: Diagnosis of  By: Jenny Reichmann MD, Hunt Oris   . HYPERLIPIDEMIA 10/01/2007    Chronic. Lovaza is too $$$ Declined statins due to worries   . OBSTRUCTIVE SLEEP APNEA 10/01/2007     NPSG 2006:  AHI 9/hr Started cpap 2009 successfully.     . Paresthesia   . Sleep apnea   . Anemia   . GERD (gastroesophageal reflux disease)   . Stroke 03/2013  . Memory loss   . Hiatal hernia   . Schatzki's ring   . Hydrocephalus   . IDA (iron deficiency anemia)     PAST SURGICAL HISTORY: Past Surgical History  Procedure Laterality Date  . Cholecystectomy  1981  . Pituitary cyst w/subsequent shunt    . Cyst removal neck    . [place] shunt  1976,    cerebral vascular shunt/ with a revision 1981    FAMILY HISTORY: Family History  Problem Relation Age of Onset  . Dementia Mother   . Kidney cancer Mother   . Alzheimer's disease Mother   . Migraines Mother   . Colon polyps Mother   . Heart disease Father   . Diabetes Father   . Melanoma Other   . Lung cancer Other   . Lung cancer Other   . Colon cancer Neg Hx   . Brain cancer Maternal Aunt   . Lung cancer Maternal Uncle   . Esophageal cancer Maternal Grandmother   . Alzheimer's disease Paternal Grandmother     SOCIAL HISTORY: History   Social History  . Marital Status: Married    Spouse Name: Elta Guadeloupe  . Number of Children: 0  . Years of Education: 12   Occupational History  . [employer]  . disabled    Social History Main Topics  . Smoking status: Never Smoker   . Smokeless tobacco: Never Used  . Alcohol Use: 0.6 oz/week    1 Glasses of wine per week     Comment: Social  . Drug Use: No  . Sexual Activity: Yes   Other Topics Concern  . Not on file   Social History Narrative   Mother is in a NH.    Patient lives at home with her husband Elta Guadeloupe). 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    High school education.   Right handed.   Caffeine 1 cup daily.   She has no children     PHYSICAL EXAM  Filed Vitals:   06/22/15 0849  BP: 130/90  Pulse: 84  Height: $Remove'4\' 10"'ClDBkln$  (1.473 m)  Weight: 134 lb (60.782 kg)   Body mass index is 28.01 kg/(m^2). PHYSICAL EXAMNIATION:  Gen:  NAD, conversant, well nourised, obese, well groomed                     Cardiovascular: Regular  rate rhythm, no peripheral edema, warm, nontender. Eyes: Conjunctivae clear without exudates or hemorrhage Neck: Supple, no carotid bruise. Pulmonary: Clear to auscultation bilaterally   NEUROLOGICAL EXAM:  MENTAL STATUS: Speech/cognition:    Speech is normal; fluent and spontaneous with normal comprehension.  Cognition:    The patient is oriented to person, place, and time;      Give detailed history,  CRANIAL NERVES: CN II: Visual fields are full to confrontation. Fundoscopic exam is normal with sharp discs and no vascular changes. Pupil were equal round reactive to light CN III, IV, VI: extraocular movement are normal. No ptosis. CN V: Facial sensation is intact to pinprick in all 3 divisions bilaterally. Corneal responses are intact.  CN VII: Face is symmetric with normal eye closure and smile. CN VIII: Hearing is normal to rubbing fingers CN IX, X: Palate elevates symmetrically. Phonation is normal. CN XI: Head turning and shoulder shrug are intact CN XII: Tongue is midline with normal movements and no atrophy.  MOTOR: Mild fixation of left arm up on rapid rotating movement  REFLEXES: Reflexes are 3  and symmetric at the biceps, triceps, knees, and ankles. Plantar responses are flexor.  SENSORY: Light touch, pinprick, position sense, and vibration sense are intact in fingers and toes.  COORDINATION: Rapid alternating movements and fine finger movements are intact. There is no dysmetria on finger-to-nose and heel-knee-shin. There are no abnormal or extraneous movements.   GAIT/STANCE: Mildly unsteady, dragging her left leg,  Romberg is absent.   DIAGNOSTIC DATA (LABS, IMAGING, TESTING) - I reviewed patient records, labs, notes, testing and imaging myself where available.  Lab Results  Component Value Date   WBC 8.2 04/09/2015   HGB 13.5 04/09/2015   HCT 39.8 04/09/2015     MCV 87.6 04/09/2015   PLT 496.0* 04/09/2015      Component Value Date/Time   NA 138 08/29/2014 1120   K 3.6 08/29/2014 1120   CL 105 08/29/2014 1120   CO2 25 08/29/2014 1120   GLUCOSE 112* 08/29/2014 1120   GLUCOSE 108* 11/22/2006 0728   BUN 12 08/29/2014 1120   CREATININE 1.0 08/29/2014 1120   CALCIUM 9.8 08/29/2014 1120   PROT 7.4 07/08/2014 0825   ALBUMIN 3.5 07/08/2014 0825   AST 21 07/08/2014 0825   ALT 19 07/08/2014 0825   ALKPHOS 45 07/08/2014 0825   BILITOT 0.3 07/08/2014 0825   GFRNONAA 76.46 06/29/2010 1025   Lab Results  Component Value Date   CHOL 250* 07/08/2014   HDL 42.50 07/08/2014   LDLCALC 185* 07/08/2014   LDLDIRECT 207.7 05/02/2011   TRIG 115.0 07/08/2014   CHOLHDL 6 07/08/2014   Lab Results  Component Value Date   HGBA1C 5.8 08/29/2014   Lab Results  Component Value Date   VITAMINB12 335 07/08/2014   Lab Results  Component Value Date   TSH 4.05 07/08/2014    ASSESSMENT AND PLAN  49 y.o. year old female   Partial seizure,  She had a history of similar spells in the past, right facial twitching, sometimes with transient confusion, responding to titrating dose of lamotrigine, she is currently taking  100 mg twice a day, will increase to 1 in the morning, one and half tablets at night Hydrocephalus  Stable based on previous imaging, no evidence of worsening, we will hold off MRI at this point Gait difficulty, left side weakness  Consistent with imaging findings, hydrocephalus, enlarged right frontal horn, right frontal encephalomalacia  Marcial Pacas, M.D. Ph.D.  [facility] [place], [place] [address] Phone: [phone] Fax:      [phone]

## 2015-06-22 NOTE — Telephone Encounter (Signed)
Faxed to [phone] - confirmed receipt.  Patient aware that it has been completed.

## 2015-06-29 ENCOUNTER — Other Ambulatory Visit: Payer: Self-pay | Admitting: *Deleted

## 2015-06-29 NOTE — Telephone Encounter (Signed)
Pt is requesting Rf on Hydroco/APAP and Lorazepam. See pended meds.

## 2015-06-30 ENCOUNTER — Other Ambulatory Visit: Payer: Self-pay | Admitting: *Deleted

## 2015-06-30 MED ORDER — HYDROCODONE-ACETAMINOPHEN 5-325 MG PO TABS: 1.0000 | ORAL_TABLET | Freq: Two times a day (BID) | ORAL | Status: DC | PRN

## 2015-06-30 MED ORDER — LORAZEPAM 1 MG PO TABS: 1.0000 mg | ORAL_TABLET | Freq: Two times a day (BID) | ORAL | Status: DC | PRN

## 2015-06-30 NOTE — Telephone Encounter (Signed)
Pt informed

## 2015-06-30 NOTE — Telephone Encounter (Signed)
Rxs are upfront for p/u.

## 2015-07-07 ENCOUNTER — Telehealth: Payer: Self-pay | Admitting: *Deleted

## 2015-07-07 MED ORDER — LORAZEPAM 1 MG PO TABS
1.0000 mg | ORAL_TABLET | Freq: Two times a day (BID) | ORAL | Status: DC | PRN
Start: 2015-07-07 — End: 2015-12-15

## 2015-07-07 MED ORDER — LAMOTRIGINE 100 MG PO TABS: ORAL_TABLET | ORAL | Status: DC

## 2015-07-07 NOTE — Telephone Encounter (Signed)
Ok Thx 

## 2015-07-07 NOTE — Telephone Encounter (Signed)
Pt states her insurance will only cover 90 days on her medications. Wanting new rx's for her lorazepam & lamictal sent for 90 day to [facility].../lmb

## 2015-07-08 NOTE — Telephone Encounter (Signed)
Notified pt [facility]'s [facility].../lmb

## 2015-07-12 ENCOUNTER — Emergency Department (HOSPITAL_COMMUNITY)
Admission: EM | Admit: 2015-07-12 | Discharge: 2015-07-12 | Disposition: A | Discharge status: Home / Self Care | Payer: BLUE CROSS/BLUE SHIELD | Attending: Emergency Medicine | Admitting: Emergency Medicine

## 2015-07-12 ENCOUNTER — Emergency Department (HOSPITAL_COMMUNITY): Payer: BLUE CROSS/BLUE SHIELD

## 2015-07-12 ENCOUNTER — Encounter (HOSPITAL_COMMUNITY): Payer: Self-pay | Admitting: Emergency Medicine

## 2015-07-12 DIAGNOSIS — E785 Hyperlipidemia, unspecified: Secondary | ICD-10-CM | POA: Insufficient documentation

## 2015-07-12 DIAGNOSIS — F419 Anxiety disorder, unspecified: Secondary | ICD-10-CM | POA: Diagnosis not present

## 2015-07-12 DIAGNOSIS — G4733 Obstructive sleep apnea (adult) (pediatric): Secondary | ICD-10-CM | POA: Diagnosis not present

## 2015-07-12 DIAGNOSIS — M6281 Muscle weakness (generalized): Secondary | ICD-10-CM | POA: Insufficient documentation

## 2015-07-12 DIAGNOSIS — F329 Major depressive disorder, single episode, unspecified: Secondary | ICD-10-CM | POA: Diagnosis not present

## 2015-07-12 DIAGNOSIS — R079 Chest pain, unspecified: Secondary | ICD-10-CM | POA: Diagnosis present

## 2015-07-12 DIAGNOSIS — Z9981 Dependence on supplemental oxygen: Secondary | ICD-10-CM | POA: Diagnosis not present

## 2015-07-12 DIAGNOSIS — R0789 Other chest pain: Secondary | ICD-10-CM | POA: Diagnosis not present

## 2015-07-12 DIAGNOSIS — G43909 Migraine, unspecified, not intractable, without status migrainosus: Secondary | ICD-10-CM | POA: Diagnosis not present

## 2015-07-12 DIAGNOSIS — Z862 Personal history of diseases of the blood and blood-forming organs and certain disorders involving the immune mechanism: Secondary | ICD-10-CM | POA: Insufficient documentation

## 2015-07-12 DIAGNOSIS — Z79899 Other long term (current) drug therapy: Secondary | ICD-10-CM | POA: Insufficient documentation

## 2015-07-12 DIAGNOSIS — Z7982 Long term (current) use of aspirin: Secondary | ICD-10-CM | POA: Insufficient documentation

## 2015-07-12 DIAGNOSIS — K449 Diaphragmatic hernia without obstruction or gangrene: Secondary | ICD-10-CM | POA: Insufficient documentation

## 2015-07-12 DIAGNOSIS — R29898 Other symptoms and signs involving the musculoskeletal system: Secondary | ICD-10-CM

## 2015-07-12 LAB — I-STAT TROPONIN, ED
Troponin i, poc: 0 ng/mL (ref 0.00–0.08)
Troponin i, poc: 0 ng/mL (ref 0.00–0.08)

## 2015-07-12 LAB — HEPATIC FUNCTION PANEL
ALT: 15 U/L (ref 14–54)
AST: 21 U/L (ref 15–41)
Albumin: 3.7 g/dL (ref 3.5–5.0)
Alkaline Phosphatase: 49 U/L (ref 38–126)
Bilirubin, Direct: 0.2 mg/dL (ref 0.1–0.5)
Indirect Bilirubin: 0.4 mg/dL (ref 0.3–0.9)
Total Bilirubin: 0.6 mg/dL (ref 0.3–1.2)
Total Protein: 7 g/dL (ref 6.5–8.1)

## 2015-07-12 LAB — BASIC METABOLIC PANEL
Anion gap: 10 (ref 5–15)
BUN: 14 mg/dL (ref 6–20)
CO2: 24 mmol/L (ref 22–32)
Calcium: 9.6 mg/dL (ref 8.9–10.3)
Chloride: 104 mmol/L (ref 101–111)
Creatinine, Ser: 1.02 mg/dL — ABNORMAL HIGH (ref 0.44–1.00)
GFR calc Af Amer: >60 mL/min (ref >60)
GFR calc non Af Amer: >60 mL/min (ref >60)
Glucose, Bld: 90 mg/dL (ref 65–99)
Potassium: 3.9 mmol/L (ref 3.5–5.1)
Sodium: 138 mmol/L (ref 135–145)

## 2015-07-12 LAB — CBC
HCT: 38.3 % (ref 36.0–46.0)
Hemoglobin: 13.1 g/dL (ref 12.0–15.0)
MCH: 32 pg (ref 26.0–34.0)
MCHC: 34.2 g/dL (ref 30.0–36.0)
MCV: 93.4 fL (ref 78.0–100.0)
Platelets: 344 10*3/uL (ref 150–400)
RBC: 4.1 MIL/uL (ref 3.87–5.11)
RDW: 12.5 % (ref 11.5–15.5)
WBC: 7.2 10*3/uL (ref 4.0–10.5)

## 2015-07-12 LAB — LIPASE, BLOOD: Lipase: 41 U/L (ref 22–51)

## 2015-07-12 MED ORDER — FAMOTIDINE IN NACL 20-0.9 MG/50ML-% IV SOLN
20.0000 mg | Freq: Once | INTRAVENOUS | Status: AC
  Administered 2015-07-12: 20 mg via INTRAVENOUS
  Filled 2015-07-12: qty 50

## 2015-07-12 MED ORDER — RANITIDINE HCL 150 MG PO TABS: ORAL_TABLET | ORAL | Status: DC

## 2015-07-12 MED ORDER — ONDANSETRON HCL 4 MG/2ML IJ SOLN
4.0000 mg | Freq: Once | INTRAMUSCULAR | Status: AC
Start: 2015-07-12 — End: 2015-07-12
  Administered 2015-07-12: 4 mg via INTRAVENOUS
  Filled 2015-07-12: qty 2

## 2015-07-12 NOTE — ED Notes (Signed)
Pt. reports right chest pain with SOB and nausea onset this evening , pt. added left arm and lips numbness , denies cough or diaphoresis .

## 2015-07-12 NOTE — Discharge Instructions (Signed)
Watch what you eat, it could make you have the pain again. Increase your zantac to twice a day for 2 weeks then back down to once a day.  Recheck if you feel worse.      Hiatal Hernia A hiatal hernia occurs when part of your stomach slides above the muscle that separates your abdomen from your chest (diaphragm). You can be born with a hiatal hernia (congenital), or it may develop over time. In almost all cases of hiatal hernia, only the top part of the stomach pushes through.  Many people have a hiatal hernia with no symptoms. The larger the hernia, the more likely that you will have symptoms. In some cases, a hiatal hernia allows stomach acid to flow back into the tube that carries food from your mouth to your stomach (esophagus). This may cause heartburn symptoms. Severe heartburn symptoms may mean you have developed a condition called gastroesophageal reflux disease (GERD).  CAUSES  Hiatal hernias are caused by a weakness in the opening (hiatus) where your esophagus passes through your diaphragm to attach to the upper part of your stomach. You may be born with a weakness in your hiatus, or a weakness can develop. RISK FACTORS Older age is a major risk factor for a hiatal hernia. Anything that increases pressure on your diaphragm can also increase your risk of a hiatal hernia. This includes:  Pregnancy.  Excess weight.  Frequent constipation. SIGNS AND SYMPTOMS  People with a hiatal hernia often have no symptoms. If symptoms develop, they are almost always caused by GERD. They may include:  Heartburn.  Belching.  Indigestion.  Trouble swallowing.  Coughing or wheezing.  Sore throat.  Hoarseness.  Chest pain. DIAGNOSIS  A hiatal hernia is sometimes found during an exam for another problem. Your health care provider may suspect a hiatal hernia if you have symptoms of GERD. Tests may be done to diagnose GERD. These may include:  X-rays of your stomach or chest.  An upper  gastrointestinal (GI) series. This is an X-ray exam of your GI tract involving the use of a chalky liquid that you swallow. The liquid shows up clearly on the X-ray.  Endoscopy. This is a procedure to look into your stomach using a thin, flexible tube that has a tiny camera and light on the end of it. TREATMENT  If you have no symptoms, you may not need treatment. If you have symptoms, treatment may include:  Dietary and lifestyle changes to help reduce GERD symptoms.  Medicines. These may include:  Over-the-counter antacids.  Medicines that make your stomach empty more quickly.  Medicines that block the production of stomach acid (H2 blockers).  Stronger medicines to reduce stomach acid (proton pump inhibitors).  You may need surgery to repair the hernia if other treatments are not helping. HOME CARE INSTRUCTIONS   Take all medicines as directed by your health care provider.  Quit smoking, if you smoke.  Try to achieve and maintain a healthy body weight.  Eat frequent small meals instead of three large meals a day. This keeps your stomach from getting too full.  Eat slowly.  Do not lie down right after eating.  Do noteat 1-2 hours before bed.   Do not drink beverages with caffeine. These include cola, coffee, cocoa, and tea.  Do not drink alcohol.  Avoid foods that can make symptoms of GERD worse. These may include:  Fatty foods.  Citrus fruits.  Other foods and drinks that contain acid (tomatoes).  Avoid putting pressure on your belly. Anything that puts pressure on your belly increases the amount of acid that may be pushed up into your esophagus.   Avoid bending over, especially after eating.  Raise the head of your bed by putting blocks under the legs. This keeps your head and esophagus higher than your stomach.  Do not wear tight clothing around your chest or stomach.  Try not to strain when having a bowel movement, when urinating, or when lifting heavy  objects. SEEK MEDICAL CARE IF:  Your symptoms are not controlled with medicines or lifestyle changes.  You are having trouble swallowing.  You have coughing or wheezing that will not go away. SEEK IMMEDIATE MEDICAL CARE IF:  Your pain is getting worse.  Your pain spreads to your arms, neck, jaw, teeth, or back.  You have shortness of breath.  You sweat for no reason.  You feel sick to your stomach (nauseous) or vomit.  You vomit blood.  You have bright red blood in your stools.  You have black, tarry stools.  Document Released: 01/21/2004 Document Revised: 03/17/2014 Document Reviewed: 10/18/2013 [facility] Patient Information 2015 [place], [place]. This information is not intended to replace advice given to you by your health care provider. Make sure you discuss any questions you have with your health care provider.

## 2015-07-12 NOTE — ED Provider Notes (Signed)
CSN: [id number]     Arrival date & time 07/12/15  0158 History   This chart was scribed for Devoria Albe, MD by Arlan Organ, ED Scribe. This patient was seen in room A06C/A06C and the patient's care was started 2:40AM.   Chief Complaint  Patient presents with  . Chest Pain   The history is provided by the patient. No language interpreter was used.    HPI Comments: Samantha [name] is a 49 y.o. female with a PMHx of hyperlipidemia, hiatal hernia, stroke, seizures, and anxiety who presents to the Emergency Department complaining of constant, ongoing Left sided chest pain with associated shortness of breath onset about 1 AM while lying in bed reading. She states the pain is sharp and needlelike and uses one finger to point her left upper chest for the pain is located. Sometimes it shoots over to the right side of her chest and she describes that as a pressure.  Pain described as sharp lasting 2-3 minutes in duration. She also reports radiating pain into her left arm with numbness along with pain describes as pressure underneath her left breast and numbness like an outline around her lower and upper lip. Samantha [name] states after initial episode came, she noted a second episode at approximately 2:15 which lasted 5-6 minutes. She reports a total of 4-5 episodes. However, she is now pain free since 2:15 AM but reports mild nausea and shortness of breath. She states she ate some salad thinking that maybe she was hungry after the first episode. He also reports she had spaghetti earlier in the evening for dinner. She denies any headache, blurred vision, fever, chills, nausea, vomiting, or abdominal pain. No numbness or loss of sensation. She had mild diaphoresis of her face. She states she normally drags her left leg since she had a stroke about 2 years ago however today it seemed to be worse. Of some tightening of the right corner of her lip which her neurologist feels is indication of seizure activity. She is not an  every day smoker and denies any alcohol consumption. She takes a baby ASA daily. Father died from a massive heart attack at the age of 49 and had prior heart disease and grandfather died of a heart attack at the age of 33. Samantha [name] is in the process of filing for disability for her history of deep brain stroke. Patient states she has a history of a hiatal hernia. She personally does not have history of heart disease.  PCP: Sonda Primes, MD   Past Medical History  Diagnosis Date  . Migraine   . Hyperlipidemia   . Cerebral ventricular shunt fitting or adjustment   . Anxiety   . Depression   . COMMON MIGRAINE 10/01/2007    Chronic    . GLUCOSE INTOLERANCE 10/01/2007    Qualifier: Diagnosis of  By: Jonny Ruiz MD, Len Blalock   . HYPERLIPIDEMIA 10/01/2007    Chronic. Lovaza is too $$$ Declined statins due to worries   . OBSTRUCTIVE SLEEP APNEA 10/01/2007    NPSG 2006:  AHI 9/hr Started cpap 2009 successfully.     . Paresthesia   . Sleep apnea   . Anemia   . GERD (gastroesophageal reflux disease)   . Stroke 03/2013  . Memory loss   . Hiatal hernia   . Schatzki's ring   . Hydrocephalus   . IDA (iron deficiency anemia)    Past Surgical History  Procedure Laterality Date  . Cholecystectomy  1981  .  Pituitary cyst w/subsequent shunt    . Cyst removal neck    . [place] shunt  1976,    cerebral vascular shunt/ with a revision 1981   Family History  Problem Relation Age of Onset  . Dementia Mother   . Kidney cancer Mother   . Alzheimer's disease Mother   . Migraines Mother   . Colon polyps Mother   . Heart disease Father   . Diabetes Father   . Melanoma Other   . Lung cancer Other   . Lung cancer Other   . Colon cancer Neg Hx   . Brain cancer Maternal Aunt   . Lung cancer Maternal Uncle   . Esophageal cancer Maternal Grandmother   . Alzheimer's disease Paternal Grandmother    Social History  Substance Use Topics  . Smoking status: Never Smoker   . Smokeless tobacco: Never Used  .  Alcohol Use: 0.6 oz/week    1 Glasses of wine per week     Comment: Social   Applying for disability Lives at home Lives with spouse  OB History    No data available     Review of Systems  Constitutional: Negative for fever and chills.  Respiratory: Positive for shortness of breath. Negative for cough.   Cardiovascular: Positive for chest pain.  Gastrointestinal: Positive for nausea. Negative for vomiting and abdominal pain.  Neurological: Negative for dizziness, weakness, numbness and headaches.  Psychiatric/Behavioral: Negative for confusion.  All other systems reviewed and are negative.     Allergies  Citalopram hydrobromide  Home Medications   Prior to Admission medications   Medication Sig Start Date End Date Taking? Authorizing Provider  aspirin 81 MG tablet Take 81 mg by mouth daily.   Yes Historical Provider, MD  atorvastatin (LIPITOR) 20 MG tablet TAKE 1 TABLET (20 MG TOTAL) BY MOUTH DAILY. 05/22/15  Yes Aleksei Plotnikov V, MD  Calcium Carbonate-Vitamin D (CALCIUM-VITAMIN D) 500-200 MG-UNIT per tablet Take 1 tablet by mouth 2 (two) times daily with a meal.   Yes Historical Provider, MD  DULoxetine (CYMBALTA) 60 MG capsule Take 1 capsule (60 mg total) by mouth daily. 01/28/15  Yes Levert Feinstein, MD  escitalopram (LEXAPRO) 10 MG tablet TAKE 1 TABLET BY MOUTH EVERY DAY 09/01/14  Yes Aleksei Plotnikov V, MD  HYDROcodone-acetaminophen (NORCO/VICODIN) 5-325 MG per tablet Take 1 tablet by mouth 2 (two) times daily as needed for moderate pain or severe pain. Please fill on or after 06/27/15 06/30/15  Yes Aleksei Plotnikov V, MD  lamoTRIgine (LAMICTAL) 100 MG tablet One tab in am, one and half every night 07/07/15  Yes Aleksei Plotnikov V, MD  LINZESS 145 MCG CAPS capsule TAKE 1 CAPSULE (145 MCG TOTAL) BY MOUTH DAILY. 05/22/15  Yes Beverley Fiedler, MD  LORazepam (ATIVAN) 1 MG tablet Take 1 tablet (1 mg total) by mouth 2 (two) times daily as needed for anxiety. 07/07/15  Yes Aleksei Plotnikov  V, MD  omega-3 acid ethyl esters (LOVAZA) 1 G capsule TAKE 2 CAPSULES (2 G TOTAL) BY MOUTH 2 (TWO) TIMES DAILY. Patient not taking: Reported on 07/12/2015 05/12/15   Tresa Garter, MD  Phosphatidylserine-DHA-EPA (VAYACOG) 100-19.5-6.5 MG CAPS Take 1 capsule by mouth daily. Patient not taking: Reported on 07/12/2015 04/27/15   Tresa Garter, MD  ranitidine (ZANTAC) 150 MG tablet Take 1 po BID x 2 weeks then once a day 07/12/15   Devoria Albe, MD  SUMAtriptan (IMITREX) 100 MG tablet Take 1 tablet (100 mg total) by mouth  daily as needed. 05/02/11   Aleksei Plotnikov V, MD   Triage Vitals: BP 140/75 mmHg  Pulse 70  Temp(Src) 99.2 F (37.3 C) (Oral)  Resp 17  Ht 4\' 9"  (1.448 m)  Wt 135 lb (61.236 kg)  BMI 29.21 kg/m2  SpO2 99%  LMP 06/10/2015  Vital signs normal     Physical Exam  Constitutional: She is oriented to person, place, and time. She appears well-developed and well-nourished.  Non-toxic appearance. She does not appear ill. No distress.  HENT:  Head: Normocephalic and atraumatic.  Right Ear: External ear normal.  Left Ear: External ear normal.  Nose: Nose normal. No mucosal edema or rhinorrhea.  Mouth/Throat: Oropharynx is clear and moist and mucous membranes are normal. No dental abscesses or uvula swelling.  Eyes: Conjunctivae and EOM are normal. Pupils are equal, round, and reactive to light.  Neck: Normal range of motion and full passive range of motion without pain. Neck supple.  Cardiovascular: Normal rate, regular rhythm and normal heart sounds.  Exam reveals no gallop and no friction rub.   No murmur heard. Pulmonary/Chest: Effort normal and breath sounds normal. No respiratory distress. She has no wheezes. She has no rhonchi. She has no rales. She exhibits no tenderness and no crepitus.    Area of pains noted, the upper chest pain is small like a finger breath  Abdominal: Soft. Normal appearance and bowel sounds are normal. She exhibits no distension. There is no  tenderness. There is no rebound and no guarding.  Musculoskeletal: Normal range of motion. She exhibits no edema or tenderness.  Moves all extremities well.   Neurological: She is alert and oriented to person, place, and time. She has normal strength. No cranial nerve deficit.  Grips are equal No pronator drift Has some heaviness on lifting her L leg in comparison to baseline. Is able to lift her leg and hold it against gravity   Skin: Skin is warm, dry and intact. No rash noted. No erythema. No pallor.  Psychiatric: She has a normal mood and affect. Her speech is normal and behavior is normal. Her mood appears not anxious.  Nursing note and vitals reviewed.   ED Course  Procedures (including critical care time)  DIAGNOSTIC STUDIES: Oxygen Saturation is 97% on RA, Normal by my interpretation.    COORDINATION OF CARE: 3:05 AM- Will order CBC, BMP, CXR, i-stat troponin I, and EKG. Discussed treatment plan with pt at bedside and pt agreed to plan.    Patient reports she normally has some dragging of her left leg and she had her stroke 2 years ago. She reports however today it is been more prominent. MRI of her brain was done to rule out acute stroke today.  05:30 pt given her MRI results. She has not had any further episodes of chest pain. Waiting for he second troponin to result.   Patient's delta troponins were negative.  06:15 husband given test results. We discussed her pain was most likely from her hiatal hernia. She did eat spaghetti last night before she had the pain.     Labs Review Results for orders placed or performed during the hospital encounter of 07/12/15  Basic metabolic panel  Result Value Ref Range   Sodium 138 135 - 145 mmol/L   Potassium 3.9 3.5 - 5.1 mmol/L   Chloride 104 101 - 111 mmol/L   CO2 24 22 - 32 mmol/L   Glucose, Bld 90 65 - 99 mg/dL   BUN 14 6 -  20 mg/dL   Creatinine, Ser 4.09 (H) 0.44 - 1.00 mg/dL   Calcium 9.6 8.9 - 81.1 mg/dL   GFR calc non Af  Amer >60 >60 mL/min   GFR calc Af Amer >60 >60 mL/min   Anion gap 10 5 - 15  CBC  Result Value Ref Range   WBC 7.2 4.0 - 10.5 K/uL   RBC 4.10 3.87 - 5.11 MIL/uL   Hemoglobin 13.1 12.0 - 15.0 g/dL   HCT 91.4 78.2 - 95.6 %   MCV 93.4 78.0 - 100.0 fL   MCH 32.0 26.0 - 34.0 pg   MCHC 34.2 30.0 - 36.0 g/dL   RDW 21.3 08.6 - 57.8 %   Platelets 344 150 - 400 K/uL  Hepatic function panel  Result Value Ref Range   Total Protein 7.0 6.5 - 8.1 g/dL   Albumin 3.7 3.5 - 5.0 g/dL   AST 21 15 - 41 U/L   ALT 15 14 - 54 U/L   Alkaline Phosphatase 49 38 - 126 U/L   Total Bilirubin 0.6 0.3 - 1.2 mg/dL   Bilirubin, Direct 0.2 0.1 - 0.5 mg/dL   Indirect Bilirubin 0.4 0.3 - 0.9 mg/dL  Lipase, blood  Result Value Ref Range   Lipase 41 22 - 51 U/L  I-stat troponin, ED  Result Value Ref Range   Troponin i, poc 0.00 0.00 - 0.08 ng/mL   Comment 3          I-stat troponin, ED  Result Value Ref Range   Troponin i, poc 0.00 0.00 - 0.08 ng/mL   Comment 3           Laboratory interpretation all normal   Imaging Review Dg Chest 2 View  07/12/2015   CLINICAL DATA:  Chest pain.  Nonsmoker.  EXAM: CHEST  2 VIEW  COMPARISON:  11/30/2009  FINDINGS: Shallow inspiration. Normal heart size and pulmonary vascularity. Lungs appear clear and expanded. No focal airspace disease or consolidation. No blunting of costophrenic angles. No pneumothorax. There appears to be an old scar tract from a right ventricular peritoneal shunt tube. Surgical clips in the right upper quadrant. Thoracolumbar scoliosis.  IMPRESSION: No active cardiopulmonary disease.   Electronically Signed   By: Burman Nieves M.D.   On: 07/12/2015 02:55   Mr Brain Wo Contrast (neuro Protocol)  07/12/2015   CLINICAL DATA:  Worsening LEFT leg weakness tonight after stroke. History of stroke and seizures, severe chest pain.  EXAM: MRI HEAD WITHOUT CONTRAST  TECHNIQUE: Multiplanar, multiecho pulse sequences of the brain and surrounding structures were  obtained without intravenous contrast.  COMPARISON:  MRI of the head March 05, 2013  FINDINGS: No reduced diffusion to suggest acute ischemia. RIGHT frontal encephalomalacia with ex vacuo dilatation of the frontal horn of the RIGHT lateral ventricle in a background of stable severe ventriculomegaly. Old RIGHT basal ganglia infarct. Ventriculoperitoneal shunt via RIGHT parietal burr hole with catheter traversing the RIGHT lateral ventricle/temporal horn. Nodular low signal within the anterior recess of third ventricle again noted. Susceptibility artifact at the level the supra sellar cistern may represent surgical clip. Patchy supratentorial white matter T2 hyperintensities exclusive of these aforementioned abnormality are similar, advanced for age.  No abnormal extra-axial fluid collections. Normal major intracranial vascular flow voids seen at the skull base. Ocular globes and orbital contents are unremarkable. Paranasal sinuses and mastoid air cells are well aerated. No abnormal sellar expansion. No cerebellar tonsillar ectopia. No suspicious calvarial bone marrow signal. Old RIGHT frontal craniotomy.  IMPRESSION: No acute intracranial process.  Stable chronic changes including RIGHT frontal encephalomalacia and severe ventriculomegaly, with ventriculoperitoneal shunt in place.  Similar nodularity anterior recess of third ventricle could represent blood products, though is nonspecific and, stable from prior imaging.   Electronically Signed   By: Awilda Metro M.D.   On: 07/12/2015 05:13   I have personally reviewed and evaluated these images and lab results as part of my medical decision-making.   EKG Interpretation   Date/Time:  Sunday July 12 2015 02:05:39 EDT Ventricular Rate:  76 PR Interval:  166 QRS Duration: 84 QT Interval:  402 QTC Calculation: 452 R Axis:   31 Text Interpretation:  Normal sinus rhythm Possible Anterior infarct , age  undetermined No significant change since last  tracing 22 Sep 2002  Confirmed by [facility]  MD-I, Daryel Kenneth ([id number]) on 07/12/2015 2:24:52 AM      MDM   Final diagnoses:  Atypical chest pain  Hiatal hernia  Left leg weakness    Modified Medications   Modified Medication Previous Medication   RANITIDINE (ZANTAC) 150 MG TABLET ranitidine (ZANTAC) 150 MG tablet      Take 1 po BID x 2 weeks then once a day    Take 1 tablet (150 mg total) by mouth at bedtime.    Plan discharge  Devoria Albe, MD, FACEP  I personally performed the services described in this documentation, which was scribed in my presence. The recorded information has been reviewed and considered.  Devoria Albe, MD, Concha Pyo, MD 07/12/15 [phone]

## 2015-07-31 ENCOUNTER — Encounter: Payer: BLUE CROSS/BLUE SHIELD | Admitting: Internal Medicine

## 2015-08-03 ENCOUNTER — Telehealth: Payer: Self-pay | Admitting: Neurology

## 2015-08-03 NOTE — Telephone Encounter (Signed)
Spoke to patient - she would like to be seen - appt scheduled with Dr. Terrace Arabia.

## 2015-08-03 NOTE — Telephone Encounter (Signed)
Patient called requesting appointment. Has been stumbling around randomly. No other symptoms. Patient has h/o stroke. Was in ED 07/12/15 for possible heart attack. It was determined it was her hiatal hernia. Patient would like for nurse to look at those results to help save time with what's going on now.

## 2015-08-04 ENCOUNTER — Encounter: Payer: Self-pay | Admitting: Neurology

## 2015-08-04 ENCOUNTER — Ambulatory Visit (INDEPENDENT_AMBULATORY_CARE_PROVIDER_SITE_OTHER): Payer: BLUE CROSS/BLUE SHIELD | Admitting: Neurology

## 2015-08-04 VITALS — BP 145/87 | HR 96 | Ht <= 58 in | Wt 133.0 lb

## 2015-08-04 DIAGNOSIS — R413 Other amnesia: Secondary | ICD-10-CM

## 2015-08-04 DIAGNOSIS — R269 Unspecified abnormalities of gait and mobility: Secondary | ICD-10-CM | POA: Diagnosis not present

## 2015-08-04 DIAGNOSIS — R569 Unspecified convulsions: Secondary | ICD-10-CM | POA: Diagnosis not present

## 2015-08-04 DIAGNOSIS — G919 Hydrocephalus, unspecified: Secondary | ICD-10-CM

## 2015-08-04 NOTE — Progress Notes (Signed)
Chief Complaint  Patient presents with  . Hydrocephalus    She is still having gait unsteadiness, fatigue and difficulty with word finding.  She went to the ED on 07/12/15 with chest pain and was told it was related to her hiatal hernia.   Chief Complaint  Patient presents with  . Hydrocephalus    She is still having gait unsteadiness, fatigue and difficulty with word finding.  She went to the ED on 07/12/15 with chest pain and was told it was related to her hiatal hernia.    [facility]  PATIENT: Samantha [name] DOB: 06-29-1966  HISTORY OF PRESENT ILLNESS: Samantha [name] 49 -year-old female returns for followup headaches, history of hydrocephalus, neck pain  She was referred by her primary care physician Dr. Alain Marion for evaluation of acute onset left-sided numbness in June 30th 2014.  She had a history of pituitary cyst, was diagnosed at age 30, she presented with right visual loss, difficulty walking, short statue, she had a right frontal craniotomy at age 47,   she developed worsening headaches few weeks later with memory loss, was found to have obstructive hydrocephalus, she had right parietal VP shunt placement.   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  In Mar 27 2013, she got up in the morning, trying to get off the bed, she suddenly felt something pulling in her neck, in the same time, she noticed numbness in her left arm, neck, but sparing left face, she also has some subjective weakness, the dense numbness lasting for 24 hours, much improved now, but she still not back to her normal self she still has intermittent numbness tingling involving the left arm, left leg   MRI of the brain in April 2014 showed moderate enlargement of the third and lateral ventricles, unchanged. There is a right parietal shunt catheter extending into the right lateral  ventricle with the tip in the temporal horn. This is unchanged. The fourth ventricle is not dilated. Right frontal craniotomy. Encephalomalacia right frontal lobe related to prior surgery. There is enlargement of the right frontal horn related to volume loss in the right frontal lobe.   MRI cervical showed focal medial foraminal disc protrusion at C5-6. Lateral foraminal disc osteophyte complex on the right at C7- T1.  Electrodiagnostic study was normal, there was no evidence of right cervical radiculopathy.   She had a history of obstructive sleep sleep apnea, she used to use CPAP machine, but has not had her settings checked for many years, she complains of nighttime snoring, excessive daytime fatigue, sleepiness,   Laboratory Evaluation showed normal B12, TSH, ANA, CMP, CBC, elevated ESR 47 in 2014,   UPDATE Sep 15th 2015:  Last clinical visit was with Hoyle Sauer, over the past few months, she complains of worsening neck pain,  neck pain, upper back pain, She has tried tramadol, Flexeril, without help, is taking Cymbalta, Lexapro, Lyrica, despite polypharmacy, she continued to have significant difficulty, generalized fatigue, bilateral upper and lower extremity heaviness, lack of stamina, frequent headaches, increased nocturia, she also complains of increased confusion, memory trouble, need help from her husband to maintain daily household chores,  Recent laboratory evaluation has demonstrated anemia, hemoglobin was 9.4, which is a change compared to previous 13.5, she is referred to hematologist,  She is in the process of applying for long-term disability, social disability  UPDATE Feb 4th 2016:  She is accompanied by her husband, and friends Hinton Dyer at today's clinical visit, she continued to have excessive fatigue, frequent neck pain, memory trouble, depression, difficulty focusing.  She has stopped working since 2014, following her recurrent episode of transient left-sided paresthesia, she  continually had similar recurrent episodes, sometimes with right facial twitching, mild confusion, no loss of consciousness, no seizure-like event,  UPDATE May 4th 2016: She is accompanied by her friend at today's visit, she complains of generalized weakness, sometimes drop things from her hands, she is tolerating lamotrigine 25 mg 2 tablets twice a day well, she has much less episode of right mouth corner twitching.  UPDATE August 8th 2016; Patient came earlier than expected today, In August 4th 2016, she noticed sudden onset right mouth twitching, lasted for 2 minutes, no LOC, there was no involvement in her arms or legs She also reported worsening memory trouble, get confused easily, made wrong turn to my clinic today, was late for the appointment. She had sleep study consistent with obstructive sleep apnea, but could not afford the co-pay for her CPAP machine. She also complains of worsening gait difficulty, tends to drag her left leg more. She has frequent headaches, yesterday headache lasted for 1 day, no blurry vision,  I have reviewed MRI of the brain in 2014 with patient,Moderate enlargement of the third and lateral ventricle, right parietal shunt catheter extending to the right lateral ventricle, with the tip in the temporal home, the fourth ventricle is not dilated, evidence of right frontal craniectomy , Encephalomalacia at the right frontal lobe, enlargement of the right frontal horn.  MRI of cervical spine: focal medial foraminal disc protrusion at C5-6.  Lateral foraminal disc osteophyte complex on the right at C7- T1.  UPDATE Aug 04 2015: Samantha [name] is with her husband at today's clinical visit, she presented to emergency room July 12 2015 complains of chest pain, troponin was negative, had a repeat MRI of the brain, we have reviewed the film together, no acute intracranial process, stable right frontal encephalomalacia, severe ventriculomegaly, with ventriculoperitoneal shunt in  place, similar nodular anterior recess of third ventricle, overall stable from previous imaging  [name] complains of gradual worsening functional status, tends to get frustrated, missing steps, forgetful, mild increased gait difficulty, no recurrent seizure, She is already on polypharmacy treatment, including lamotrigine 100 mg/150 mg, Lexapro 10 mg daily, Cymbalta 60 mg every day, Ativan as needed.  REVIEW OF SYSTEMS: Full 14 system review of systems performed and notable only for those listed, activity change, fatigue, chest pain, daytime sleepiness, snoring, memory loss, dizziness, seizure, weakness, facial drooping, confusion, depression, anxiety.   ALLERGIES: Allergies  Allergen Reactions  . Citalopram Hydrobromide     REACTION: low libido    HOME MEDICATIONS: Outpatient Prescriptions Prior to Visit  Medication Sig Dispense Refill  . aspirin 81 MG tablet Take 81 mg by mouth daily.    Marland Kitchen atorvastatin (LIPITOR) 20 MG tablet TAKE 1 TABLET (20 MG TOTAL) BY MOUTH DAILY. 90 tablet 3  . Calcium Carbonate-Vitamin D (CALCIUM-VITAMIN D) 500-200 MG-UNIT per tablet Take 1 tablet by mouth 2 (two) times daily with a meal.    . DULoxetine (CYMBALTA) 60 MG capsule Take 1 capsule (60 mg total) by mouth daily. 90 capsule 3  . escitalopram (LEXAPRO) 10 MG tablet TAKE 1 TABLET BY MOUTH EVERY DAY 90 tablet 3  . HYDROcodone-acetaminophen (NORCO/VICODIN) 5-325 MG per tablet Take 1 tablet by mouth 2 (two) times daily as needed for moderate pain or severe pain.  Please fill on or after 06/27/15 60 tablet 0  . lamoTRIgine (LAMICTAL) 100 MG tablet One tab in am, one and half every night 240 tablet 3  . LINZESS 145 MCG CAPS capsule TAKE 1 CAPSULE (145 MCG TOTAL) BY MOUTH DAILY. 30 capsule 2  . LORazepam (ATIVAN) 1 MG tablet Take 1 tablet (1 mg total) by mouth 2 (two) times daily as needed for anxiety. 180 tablet 1  . omega-3 acid ethyl esters (LOVAZA) 1 G capsule TAKE 2 CAPSULES (2 G TOTAL) BY MOUTH 2 (TWO) TIMES  DAILY. 360 capsule 3  . Phosphatidylserine-DHA-EPA (VAYACOG) 100-19.5-6.5 MG CAPS Take 1 capsule by mouth daily. 30 capsule 11  . ranitidine (ZANTAC) 150 MG tablet Take 1 po BID x 2 weeks then once a day 60 tablet 0  . SUMAtriptan (IMITREX) 100 MG tablet Take 1 tablet (100 mg total) by mouth daily as needed. 10 tablet 11   No facility-administered medications prior to visit.    PAST MEDICAL HISTORY: Past Medical History  Diagnosis Date  . Migraine   . Hyperlipidemia   . Cerebral ventricular shunt fitting or adjustment   . Anxiety   . Depression   . COMMON MIGRAINE 10/01/2007    Chronic    . GLUCOSE INTOLERANCE 10/01/2007    Qualifier: Diagnosis of  By: Jenny Reichmann MD, Hunt Oris   . HYPERLIPIDEMIA 10/01/2007    Chronic. Lovaza is too $$$ Declined statins due to worries   . OBSTRUCTIVE SLEEP APNEA 10/01/2007    NPSG 2006:  AHI 9/hr Started cpap 2009 successfully.     . Paresthesia   . Sleep apnea   . Anemia   . GERD (gastroesophageal reflux disease)   . Stroke 03/2013  . Memory loss   . Hiatal hernia   . Schatzki's ring   . Hydrocephalus   . IDA (iron deficiency anemia)     PAST SURGICAL HISTORY: Past Surgical History  Procedure Laterality Date  . Cholecystectomy  1981  . Pituitary cyst w/subsequent shunt    . Cyst removal neck    . [place] shunt  1976,    cerebral vascular shunt/ with a revision 1981    FAMILY HISTORY: Family History  Problem Relation Age of Onset  . Dementia Mother   . Kidney cancer Mother   . Alzheimer's disease Mother   . Migraines Mother   . Colon polyps Mother   . Heart disease Father   . Diabetes Father   . Melanoma Other   . Lung cancer Other   . Lung cancer Other   . Colon cancer Neg Hx   . Brain cancer Maternal Aunt   . Lung cancer Maternal Uncle   . Esophageal cancer Maternal Grandmother   . Alzheimer's disease Paternal Grandmother     SOCIAL HISTORY: Social History   Social History  . Marital Status: Married    Spouse Name: Elta Guadeloupe    . Number of Children: 0  . Years of Education: 12   Occupational History  . [employer]  . disabled    Social History Main Topics  . Smoking status: Never Smoker   . Smokeless tobacco: Never Used  . Alcohol Use: 0.6 oz/week    1 Glasses of wine per week     Comment: Social  . Drug Use: No  . Sexual Activity: Yes   Other Topics Concern  . Not on file   Social History Narrative   Mother is in a NH.  Patient lives at home with her husband Elta Guadeloupe). 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    High school education.   Right handed.   Caffeine 1 cup daily.   She has no children     PHYSICAL EXAM  Filed Vitals:   08/04/15 1046  BP: 145/87  Pulse: 96  Height: 4' 9"  (1.448 m)  Weight: 133 lb (60.328 kg)   Body mass index is 28.77 kg/(m^2). PHYSICAL EXAMNIATION:  Gen: NAD, conversant, well nourised, obese, well groomed                     Cardiovascular: Regular rate rhythm, no peripheral edema, warm, nontender. Eyes: Conjunctivae clear without exudates or hemorrhage Neck: Supple, no carotid bruise. Pulmonary: Clear to auscultation bilaterally   NEUROLOGICAL EXAM:  MENTAL STATUS: Speech/cognition:    Speech is normal; fluent and spontaneous with normal comprehension.  Cognition:    The patient is oriented to person, place, and time;      Give detailed history,  CRANIAL NERVES: CN II: Visual fields are full to confrontation. Fundoscopic exam is normal with sharp discs and no vascular changes. Pupil were equal round reactive to light CN III, IV, VI: extraocular movement are normal. No ptosis. CN V: Facial sensation is intact to pinprick in all 3 divisions bilaterally. Corneal responses are intact.  CN VII: Face is symmetric with normal eye closure and smile. CN VIII: Hearing is normal to rubbing fingers CN IX, X: Palate elevates symmetrically. Phonation is normal. CN XI: Head turning and shoulder shrug are intact CN XII: Tongue is  midline with normal movements and no atrophy.  MOTOR: Mild fixation of left arm up on rapid rotating movement, Mild left lower extremity spasticity  REFLEXES: Reflexes are 3  and symmetric at the biceps, triceps, knees, and ankles. Plantar responses are flexor.  SENSORY: Light touch, pinprick, position sense, and vibration sense are intact in fingers and toes.  COORDINATION: Rapid alternating movements and fine finger movements are intact. There is no dysmetria on finger-to-nose and heel-knee-shin.   GAIT/STANCE: Mildly unsteady,  stiff, dragging her left leg,  Romberg is absent.   DIAGNOSTIC DATA (LABS, IMAGING, TESTING) - I reviewed patient records, labs, notes, testing and imaging myself where available.  Lab Results  Component Value Date   WBC 7.2 07/12/2015   HGB 13.1 07/12/2015   HCT 38.3 07/12/2015   MCV 93.4 07/12/2015   PLT 344 07/12/2015      Component Value Date/Time   NA 138 07/12/2015 0230   K 3.9 07/12/2015 0230   CL 104 07/12/2015 0230   CO2 24 07/12/2015 0230   GLUCOSE 90 07/12/2015 0230   GLUCOSE 108* 11/22/2006 0728   BUN 14 07/12/2015 0230   CREATININE 1.02* 07/12/2015 0230   CALCIUM 9.6 07/12/2015 0230   PROT 7.0 07/12/2015 0230   ALBUMIN 3.7 07/12/2015 0230   AST 21 07/12/2015 0230   ALT 15 07/12/2015 0230   ALKPHOS 49 07/12/2015 0230   BILITOT 0.6 07/12/2015 0230   GFRNONAA >60 07/12/2015 0230   GFRAA >60 07/12/2015 0230   Lab Results  Component Value Date   CHOL 250* 07/08/2014   HDL 42.50 07/08/2014   LDLCALC 185* 07/08/2014   LDLDIRECT 207.7 05/02/2011   TRIG 115.0 07/08/2014   CHOLHDL 6 07/08/2014   Lab Results  Component Value Date   HGBA1C 5.8 08/29/2014   Lab Results  Component Value Date   VITAMINB12 335 07/08/2014   Lab Results  Component  Value Date   TSH 4.05 07/08/2014    ASSESSMENT AND PLAN  49 y.o. year old female   Partial seizure,  She had a history of similar spells in the past, right facial twitching,  sometimes with transient confusion, responding to titrating dose of lamotrigine, keep current dose 100  mg/150 mg  Hydrocephalus  Stable based on previous imaging, no evidence of worsening, Gait difficulty, left side weakness  Consistent with imaging findings, hydrocephalus, enlarged right frontal horn, right frontal encephalomalacia  Marcial Pacas, M.D. Ph.D.  [facility] [place], [place] [address] Phone: [phone] Fax:      [phone]

## 2015-08-05 ENCOUNTER — Telehealth: Payer: Self-pay | Admitting: *Deleted

## 2015-08-05 MED ORDER — HYDROCODONE-ACETAMINOPHEN 5-325 MG PO TABS: 1.0000 | ORAL_TABLET | Freq: Two times a day (BID) | ORAL | Status: DC | PRN

## 2015-08-05 NOTE — Telephone Encounter (Signed)
OK to fill this prescription with additional refills x0 OV q 3 mo Thank you!  

## 2015-08-05 NOTE — Telephone Encounter (Signed)
Notified pt rx ready for pick-up.../lmb 

## 2015-08-05 NOTE — Telephone Encounter (Signed)
Receive call pt requesting refill on her hydrocodone.../lmb 

## 2015-08-12 ENCOUNTER — Encounter: Payer: BLUE CROSS/BLUE SHIELD | Admitting: Internal Medicine

## 2015-08-20 ENCOUNTER — Telehealth: Payer: Self-pay | Admitting: Geriatric Medicine

## 2015-08-20 ENCOUNTER — Telehealth: Payer: Self-pay | Admitting: Internal Medicine

## 2015-08-20 NOTE — Telephone Encounter (Signed)
PLEASE NOTE: All timestamps contained within this report are represented as [place] Standard Time. CONFIDENTIALTY NOTICE: This fax transmission is intended only for the addressee. It contains information that is legally privileged, confidential or otherwise protected from use or disclosure. If you are not the intended recipient, you are strictly prohibited from reviewing, disclosing, copying using or disseminating any of this information or taking any action in reliance on or regarding this information. If you have received this fax in error, please notify us immediately by telephone so that we can arrange for its return to [place]. Phone: [phone], Toll-Free: [phone], Fax: [phone] Page: 1 of 1 Call Id: [id number] [facility] - Client TELEPHONE ADVICE RECORD [facility] Patient Name: Samantha [name] DOB: 05-26-1966 Initial Comment Caller states her perscription is $91.00, wants something else called in. Nurse Assessment Nurse: Roderic Ovens, RN, Amy Date/Time Lamount Cohen Time): 08/20/2015 10:23:24 AM Please select the assessment type ---Verbal order / New medication order THIS IS A NEW MEDICATION AND THE CALLER STATES THAT HER INSURANCE IS NOT COVERING IT AND SHE IS REQUESTING A NEW MEDICATION. SHE STATES THAT HER MEMORY IS STILL BAD. Does the client directives allow for assistance with medications after hours? ---No Additional Documentation ---PHARMACY INFORMATION IS ON FILE. Guidelines Guideline Title Affirmed Question Affirmed Notes Final Disposition User

## 2015-08-20 NOTE — Telephone Encounter (Signed)
Keep ROV ?Thx ?

## 2015-08-20 NOTE — Telephone Encounter (Signed)
Pt called to let you know that Dr. Macario Golds gave her the [facility] on 06/22/15.

## 2015-08-20 NOTE — Telephone Encounter (Signed)
Needs replacement for vayacog due to cost. Please call patient.

## 2015-08-20 NOTE — Telephone Encounter (Signed)
Forwarding msg to PCP.../lmb 

## 2015-08-21 ENCOUNTER — Telehealth: Payer: Self-pay | Admitting: *Deleted

## 2015-08-21 NOTE — Telephone Encounter (Signed)
Called pt gave md response. Pt stated she didn't make f/u appt don't have one schedule. She is wanting md to change the Vayacog to something else since insurance will not cover. Inform pt she will  Need to contact her insurance & ask them what alternative they have because md wouldn't know what her insurance cover. Once she get the alternatives she will call bck to let md know...[name]

## 2015-08-21 NOTE — Telephone Encounter (Signed)
Pt call back stating she spoke with her insurance company there is no alternative for [employer], but if md would wite a letter to [employer] stating that this is the med md has chose for her to take, and she is needing med insurance will probably cover. Pls fax to [phone]...[name]

## 2015-08-21 NOTE — Telephone Encounter (Signed)
pls write a letter Thx 

## 2015-08-24 ENCOUNTER — Telehealth: Payer: Self-pay | Admitting: *Deleted

## 2015-08-24 NOTE — Telephone Encounter (Signed)
Receive call pt states the hydrocodone is not helping her pain symptoms. She is wanting md to increase milligram. She stated she saw her neurologist "Dr. Terrace Arabia" discuss symptoms with her she told her to let PCP know, and see if pain med can be increase...[name]

## 2015-08-24 NOTE — Telephone Encounter (Signed)
Generated letter fax to # below. Notified pt letter has been fax...Samantha [name]

## 2015-08-24 NOTE — Telephone Encounter (Signed)
Needs OV Thx 

## 2015-08-25 NOTE — Telephone Encounter (Signed)
Called pt gave md response. Made appt for 08/27/15...[name]

## 2015-08-27 ENCOUNTER — Ambulatory Visit (INDEPENDENT_AMBULATORY_CARE_PROVIDER_SITE_OTHER): Payer: BLUE CROSS/BLUE SHIELD | Admitting: Internal Medicine

## 2015-08-27 ENCOUNTER — Encounter: Payer: Self-pay | Admitting: Internal Medicine

## 2015-08-27 VITALS — BP 130/90 | HR 95 | Wt 136.0 lb

## 2015-08-27 DIAGNOSIS — F419 Anxiety disorder, unspecified: Secondary | ICD-10-CM

## 2015-08-27 DIAGNOSIS — Z23 Encounter for immunization: Secondary | ICD-10-CM

## 2015-08-27 DIAGNOSIS — M545 Low back pain: Secondary | ICD-10-CM

## 2015-08-27 DIAGNOSIS — F4323 Adjustment disorder with mixed anxiety and depressed mood: Secondary | ICD-10-CM | POA: Diagnosis not present

## 2015-08-27 DIAGNOSIS — M79605 Pain in left leg: Secondary | ICD-10-CM

## 2015-08-27 DIAGNOSIS — E785 Hyperlipidemia, unspecified: Secondary | ICD-10-CM | POA: Diagnosis not present

## 2015-08-27 MED ORDER — HYDROCODONE-ACETAMINOPHEN 7.5-325 MG PO TABS: 1.0000 | ORAL_TABLET | Freq: Three times a day (TID) | ORAL | Status: DC | PRN

## 2015-08-27 NOTE — Assessment & Plan Note (Signed)
Worse Options discussed 

## 2015-08-27 NOTE — Progress Notes (Signed)
Pre visit review using our clinic review tool, if applicable. No additional management support is needed unless otherwise documented below in the visit note. 

## 2015-08-27 NOTE — Assessment & Plan Note (Signed)
Chronic  Potential benefits of a long term benzodiazepines  use as well as potential risks  and complications were explained to the patient and were aknowledged. Cymbalta, Lexapro Lorazepam prn

## 2015-08-27 NOTE — Progress Notes (Signed)
Subjective:  Patient ID: Samantha [name], female    DOB: 1966-05-31  Age: 49 y.o. MRN: [id number]  CC: No chief complaint on file.   HPI Samantha [name] presents for:  F/u fatigue - is planning to start CPAP soop when she can afford it F/u anemia - off iron per Dr Rhea Belton  F/u facial twitching - on Lamictal, Lamotrigine at night F/u L side pain - (back, leg, arm) - on Norco - not helping C/o still being depressed, tired. C/o weak legs, memory loss, weakness  Outpatient Prescriptions Prior to Visit  Medication Sig Dispense Refill  . aspirin 81 MG tablet Take 81 mg by mouth daily.    Marland Kitchen atorvastatin (LIPITOR) 20 MG tablet TAKE 1 TABLET (20 MG TOTAL) BY MOUTH DAILY. 90 tablet 3  . Calcium Carbonate-Vitamin D (CALCIUM-VITAMIN D) 500-200 MG-UNIT per tablet Take 1 tablet by mouth 2 (two) times daily with a meal.    . DULoxetine (CYMBALTA) 60 MG capsule Take 1 capsule (60 mg total) by mouth daily. 90 capsule 3  . escitalopram (LEXAPRO) 10 MG tablet TAKE 1 TABLET BY MOUTH EVERY DAY 90 tablet 3  . lamoTRIgine (LAMICTAL) 100 MG tablet One tab in am, one and half every night 240 tablet 3  . LORazepam (ATIVAN) 1 MG tablet Take 1 tablet (1 mg total) by mouth 2 (two) times daily as needed for anxiety. 180 tablet 1  . omega-3 acid ethyl esters (LOVAZA) 1 G capsule TAKE 2 CAPSULES (2 G TOTAL) BY MOUTH 2 (TWO) TIMES DAILY. 360 capsule 3  . ranitidine (ZANTAC) 150 MG tablet Take 1 po BID x 2 weeks then once a day 60 tablet 0  . SUMAtriptan (IMITREX) 100 MG tablet Take 1 tablet (100 mg total) by mouth daily as needed. 10 tablet 11  . HYDROcodone-acetaminophen (NORCO/VICODIN) 5-325 MG per tablet Take 1 tablet by mouth 2 (two) times daily as needed for moderate pain or severe pain. 60 tablet 0  . LINZESS 145 MCG CAPS capsule TAKE 1 CAPSULE (145 MCG TOTAL) BY MOUTH DAILY. 30 capsule 2  . Phosphatidylserine-DHA-EPA (VAYACOG) 100-19.5-6.5 MG CAPS Take 1 capsule by mouth daily. (Patient not taking: Reported on  08/27/2015) 30 capsule 11   No facility-administered medications prior to visit.    ROS Review of Systems  Constitutional: Positive for fatigue. Negative for chills, activity change, appetite change and unexpected weight change.  HENT: Negative for congestion, mouth sores and sinus pressure.   Eyes: Negative for visual disturbance.  Respiratory: Negative for cough and chest tightness.   Gastrointestinal: Negative for nausea and abdominal pain.  Genitourinary: Negative for frequency, difficulty urinating and vaginal pain.  Musculoskeletal: Positive for back pain, neck pain and neck stiffness. Negative for gait problem.  Skin: Negative for pallor and rash.  Neurological: Positive for weakness. Negative for dizziness, tremors, numbness and headaches.  Psychiatric/Behavioral: Negative for confusion and sleep disturbance. The patient is nervous/anxious.     Objective:  BP 130/90 mmHg  Pulse 95  Wt 136 lb (61.689 kg)  SpO2 96%  BP Readings from Last 3 Encounters:  08/27/15 130/90  08/04/15 145/87  07/12/15 149/79    Wt Readings from Last 3 Encounters:  08/27/15 136 lb (61.689 kg)  08/04/15 133 lb (60.328 kg)  07/12/15 135 lb (61.236 kg)    Physical Exam  Constitutional: She appears well-developed. No distress.  HENT:  Head: Normocephalic.  Right Ear: External ear normal.  Left Ear: External ear normal.  Nose: Nose normal.  Mouth/Throat:  Oropharynx is clear and moist.  Eyes: Conjunctivae are normal. Pupils are equal, round, and reactive to light. Right eye exhibits no discharge. Left eye exhibits no discharge.  Neck: Normal range of motion. Neck supple. No JVD present. No tracheal deviation present. No thyromegaly present.  Cardiovascular: Normal rate, regular rhythm and normal heart sounds.   Pulmonary/Chest: No stridor. No respiratory distress. She has no wheezes.  Abdominal: Soft. Bowel sounds are normal. She exhibits no distension and no mass. There is no tenderness.  There is no rebound and no guarding.  Musculoskeletal: She exhibits no edema or tenderness.  Lymphadenopathy:    She has no cervical adenopathy.  Neurological: She displays normal reflexes. No cranial nerve deficit. She exhibits normal muscle tone. Coordination normal.  Skin: No rash noted. No erythema.  Psychiatric: She has a normal mood and affect. Her behavior is normal. Judgment and thought content normal.    Lab Results  Component Value Date   WBC 7.2 07/12/2015   HGB 13.1 07/12/2015   HCT 38.3 07/12/2015   PLT 344 07/12/2015   GLUCOSE 90 07/12/2015   CHOL 250* 07/08/2014   TRIG 115.0 07/08/2014   HDL 42.50 07/08/2014   LDLDIRECT 207.7 05/02/2011   LDLCALC 185* 07/08/2014   ALT 15 07/12/2015   AST 21 07/12/2015   NA 138 07/12/2015   K 3.9 07/12/2015   CL 104 07/12/2015   CREATININE 1.02* 07/12/2015   BUN 14 07/12/2015   CO2 24 07/12/2015   TSH 4.05 07/08/2014   HGBA1C 5.8 08/29/2014    Dg Chest 2 View  07/12/2015  CLINICAL DATA:  Chest pain.  Nonsmoker. EXAM: CHEST  2 VIEW COMPARISON:  11/30/2009 FINDINGS: Shallow inspiration. Normal heart size and pulmonary vascularity. Lungs appear clear and expanded. No focal airspace disease or consolidation. No blunting of costophrenic angles. No pneumothorax. There appears to be an old scar tract from a right ventricular peritoneal shunt tube. Surgical clips in the right upper quadrant. Thoracolumbar scoliosis. IMPRESSION: No active cardiopulmonary disease. Electronically Signed   By: Burman Nieves M.D.   On: 07/12/2015 02:55   Mr Brain Wo Contrast (neuro Protocol)  07/12/2015  CLINICAL DATA:  Worsening LEFT leg weakness tonight after stroke. History of stroke and seizures, severe chest pain. EXAM: MRI HEAD WITHOUT CONTRAST TECHNIQUE: Multiplanar, multiecho pulse sequences of the brain and surrounding structures were obtained without intravenous contrast. COMPARISON:  MRI of the head March 05, 2013 FINDINGS: No reduced diffusion to  suggest acute ischemia. RIGHT frontal encephalomalacia with ex vacuo dilatation of the frontal horn of the RIGHT lateral ventricle in a background of stable severe ventriculomegaly. Old RIGHT basal ganglia infarct. Ventriculoperitoneal shunt via RIGHT parietal burr hole with catheter traversing the RIGHT lateral ventricle/temporal horn. Nodular low signal within the anterior recess of third ventricle again noted. Susceptibility artifact at the level the supra sellar cistern may represent surgical clip. Patchy supratentorial white matter T2 hyperintensities exclusive of these aforementioned abnormality are similar, advanced for age. No abnormal extra-axial fluid collections. Normal major intracranial vascular flow voids seen at the skull base. Ocular globes and orbital contents are unremarkable. Paranasal sinuses and mastoid air cells are well aerated. No abnormal sellar expansion. No cerebellar tonsillar ectopia. No suspicious calvarial bone marrow signal. Old RIGHT frontal craniotomy. IMPRESSION: No acute intracranial process. Stable chronic changes including RIGHT frontal encephalomalacia and severe ventriculomegaly, with ventriculoperitoneal shunt in place. Similar nodularity anterior recess of third ventricle could represent blood products, though is nonspecific and, stable from prior imaging. Electronically  Signed   By: Awilda Metroourtnay  Bloomer M.D.   On: 07/12/2015 05:13    Assessment & Plan:   Diagnoses and all orders for this visit:  Need for influenza vaccination -     Flu Vaccine QUAD 36+ mos IM  Other orders -     Discontinue: HYDROcodone-acetaminophen (NORCO) 7.5-325 MG tablet; Take 1 tablet by mouth 3 (three) times daily as needed for severe pain. -     HYDROcodone-acetaminophen (NORCO) 7.5-325 MG tablet; Take 1 tablet by mouth 3 (three) times daily as needed for severe pain.   I have discontinued Ms. Rodman KeyJones's LINZESS. I am also having her maintain her SUMAtriptan, calcium-vitamin D,  escitalopram, aspirin, DULoxetine, Phosphatidylserine-DHA-EPA, omega-3 acid ethyl esters, atorvastatin, LORazepam, lamoTRIgine, ranitidine, and HYDROcodone-acetaminophen.  Meds ordered this encounter  Medications  . DISCONTD: HYDROcodone-acetaminophen (NORCO) 7.5-325 MG tablet    Sig: Take 1 tablet by mouth 3 (three) times daily as needed for severe pain.    Dispense:  90 tablet    Refill:  0  . HYDROcodone-acetaminophen (NORCO) 7.5-325 MG tablet    Sig: Take 1 tablet by mouth 3 (three) times daily as needed for severe pain.    Dispense:  90 tablet    Refill:  0    Please fill on or after 09/27/15     Follow-up: Return in about 2 months (around 10/27/2015) for a follow-up visit.  Sonda PrimesAlex Kyuss Hale, MD

## 2015-08-27 NOTE — Assessment & Plan Note (Signed)
Situational 2014 - due to illness On Cymbalta

## 2015-08-27 NOTE — Assessment & Plan Note (Signed)
On Lipitor 

## 2015-08-28 ENCOUNTER — Telehealth: Payer: Self-pay | Admitting: Internal Medicine

## 2015-08-28 DIAGNOSIS — I639 Cerebral infarction, unspecified: Secondary | ICD-10-CM

## 2015-08-28 NOTE — Telephone Encounter (Signed)
Patient states that she would like a referral to neurology.   Dr. Kendell BaneWilliam Bell  [facility]:  Address: [address], [place], [place] [address] Phone: [phone]  No appt at this time.

## 2015-09-03 ENCOUNTER — Other Ambulatory Visit: Payer: Self-pay | Admitting: Internal Medicine

## 2015-09-03 NOTE — Telephone Encounter (Signed)
Notified pt md has place referral.../LMB

## 2015-09-03 NOTE — Telephone Encounter (Signed)
Ok Thx 

## 2015-09-04 ENCOUNTER — Other Ambulatory Visit: Payer: Self-pay | Admitting: Internal Medicine

## 2015-09-07 ENCOUNTER — Telehealth: Payer: Self-pay | Admitting: Internal Medicine

## 2015-09-07 NOTE — Telephone Encounter (Signed)
Patient was wondering if you have sent her last office visit notes to her attorney. She states you know about this. Please give her a call at [phone]

## 2015-09-09 NOTE — Telephone Encounter (Signed)
pls mail to pt, unless we have info release permission from pt Thx

## 2015-09-09 NOTE — Telephone Encounter (Signed)
Notified pt with md response. She is asking if can be fax to him @ [phone] att: Lelon MastSamantha. Printed ov 10/13, and fax to # given...[name]/lmb

## 2015-09-10 NOTE — Telephone Encounter (Signed)
Error

## 2015-09-18 ENCOUNTER — Telehealth: Payer: Self-pay | Admitting: *Deleted

## 2015-09-18 DIAGNOSIS — R51 Headache: Principal | ICD-10-CM

## 2015-09-18 DIAGNOSIS — G8929 Other chronic pain: Secondary | ICD-10-CM

## 2015-09-18 DIAGNOSIS — R519 Headache, unspecified: Secondary | ICD-10-CM

## 2015-09-18 NOTE — Telephone Encounter (Signed)
Phyllis from [facility] LackMary[facility] needs a head CT and Shunt series plain before they can see her. Please advise.

## 2015-09-20 NOTE — Telephone Encounter (Signed)
OK -ordered Thx

## 2015-09-21 NOTE — Telephone Encounter (Signed)
Pt informed

## 2015-09-23 ENCOUNTER — Ambulatory Visit: Payer: Self-pay | Admitting: Neurology

## 2015-09-23 ENCOUNTER — Ambulatory Visit: Payer: BLUE CROSS/BLUE SHIELD | Admitting: Neurology

## 2015-09-28 ENCOUNTER — Telehealth: Payer: Self-pay

## 2015-09-28 NOTE — Telephone Encounter (Signed)
-----   Message from Chrystie NoseLinda R Elsey Holts, RN sent at 04/14/2015  3:23 PM EDT ----- Regarding: Labs Pt needs labs, orders in epic.

## 2015-09-28 NOTE — Telephone Encounter (Signed)
Pt aware.

## 2015-10-05 ENCOUNTER — Other Ambulatory Visit: Payer: Self-pay | Admitting: Internal Medicine

## 2015-10-12 NOTE — Telephone Encounter (Signed)
Please enter orders for shunt series xrays.

## 2015-10-12 NOTE — Telephone Encounter (Signed)
I can't find it in Epic Thx

## 2015-10-13 NOTE — Telephone Encounter (Signed)
[name], do you know the order/img number to order the shunt series xray

## 2015-10-14 NOTE — Telephone Encounter (Signed)
Pt is aware to come here for xrays.

## 2015-10-14 NOTE — Telephone Encounter (Signed)
Orders have been reentered and signed.   Let me know if there is any other issue

## 2015-10-14 NOTE — Addendum Note (Signed)
Addended by: Radford PaxAIRRIKIER DAVIDSON, Mahala Rommel M on: 10/14/2015 03:50 PM   Modules accepted: Orders

## 2015-10-14 NOTE — Telephone Encounter (Signed)
It will be 3 separate orders. 1 view chest (IMG34), 1 view abdomen (IMG 154), 2 view skull (IMG21).

## 2015-10-21 ENCOUNTER — Ambulatory Visit (INDEPENDENT_AMBULATORY_CARE_PROVIDER_SITE_OTHER)
Admission: RE | Admit: 2015-10-21 | Discharge: 2015-10-21 | Disposition: A | Discharge status: Home / Self Care | Payer: BLUE CROSS/BLUE SHIELD | Source: Ambulatory Visit | Attending: Internal Medicine | Admitting: Internal Medicine

## 2015-10-21 ENCOUNTER — Ambulatory Visit
Admission: RE | Admit: 2015-10-21 | Discharge: 2015-10-21 | Disposition: A | Discharge status: Home / Self Care | Payer: BLUE CROSS/BLUE SHIELD | Source: Ambulatory Visit | Attending: Internal Medicine | Admitting: Internal Medicine

## 2015-10-21 DIAGNOSIS — G8929 Other chronic pain: Secondary | ICD-10-CM

## 2015-10-21 DIAGNOSIS — R519 Headache, unspecified: Secondary | ICD-10-CM

## 2015-10-21 DIAGNOSIS — R51 Headache: Secondary | ICD-10-CM | POA: Diagnosis not present

## 2015-10-22 ENCOUNTER — Telehealth: Payer: Self-pay | Admitting: Internal Medicine

## 2015-10-22 ENCOUNTER — Telehealth: Payer: Self-pay | Admitting: Neurology

## 2015-10-22 ENCOUNTER — Ambulatory Visit: Payer: BLUE CROSS/BLUE SHIELD | Admitting: Nurse Practitioner

## 2015-10-22 NOTE — Telephone Encounter (Signed)
OK to fill this prescription (7 days) with additional refills x0 Thank you!

## 2015-10-22 NOTE — Telephone Encounter (Signed)
Spoke to WeatogueMichele to let her know she needs to contact Dr. Valerie SaltsPlotikov - the tests were ordered by him and he also manages her hydrocodone.

## 2015-10-22 NOTE — Telephone Encounter (Signed)
Needs OV for dose change Thx

## 2015-10-22 NOTE — Telephone Encounter (Signed)
OV scheduled for 10/27/15. She is completely out of meds. What should she do between now and then? Please advise.

## 2015-10-22 NOTE — Telephone Encounter (Signed)
Pt requesting refill for HYDROcodone-acetaminophen (NORCO) 7.5-325 MG tablet [[id number]][[id number]]  She states that this strength is not working and is wondering if you can increase. She is taking more than what's prescribed.

## 2015-10-22 NOTE — Telephone Encounter (Signed)
Patient needs an Rx for Vicodin - and wants a call back on tests. Best call back [phone].

## 2015-10-23 ENCOUNTER — Other Ambulatory Visit: Payer: Self-pay

## 2015-10-23 MED ORDER — HYDROCODONE-ACETAMINOPHEN 7.5-325 MG PO TABS: 1.0000 | ORAL_TABLET | Freq: Three times a day (TID) | ORAL | Status: DC | PRN

## 2015-10-27 ENCOUNTER — Ambulatory Visit: Payer: BLUE CROSS/BLUE SHIELD | Admitting: Internal Medicine

## 2015-10-27 ENCOUNTER — Ambulatory Visit (INDEPENDENT_AMBULATORY_CARE_PROVIDER_SITE_OTHER): Payer: BLUE CROSS/BLUE SHIELD | Admitting: Internal Medicine

## 2015-10-27 ENCOUNTER — Encounter: Payer: Self-pay | Admitting: Internal Medicine

## 2015-10-27 VITALS — BP 130/84 | HR 94 | Wt 134.0 lb

## 2015-10-27 DIAGNOSIS — R413 Other amnesia: Secondary | ICD-10-CM

## 2015-10-27 DIAGNOSIS — K5901 Slow transit constipation: Secondary | ICD-10-CM

## 2015-10-27 DIAGNOSIS — M542 Cervicalgia: Secondary | ICD-10-CM

## 2015-10-27 DIAGNOSIS — R202 Paresthesia of skin: Secondary | ICD-10-CM

## 2015-10-27 DIAGNOSIS — F4323 Adjustment disorder with mixed anxiety and depressed mood: Secondary | ICD-10-CM

## 2015-10-27 DIAGNOSIS — Z982 Presence of cerebrospinal fluid drainage device: Secondary | ICD-10-CM

## 2015-10-27 DIAGNOSIS — G919 Hydrocephalus, unspecified: Secondary | ICD-10-CM

## 2015-10-27 DIAGNOSIS — G8929 Other chronic pain: Secondary | ICD-10-CM

## 2015-10-27 MED ORDER — HYDROCODONE-ACETAMINOPHEN 10-325 MG PO TABS: 0.5000 | ORAL_TABLET | Freq: Four times a day (QID) | ORAL | Status: DC | PRN

## 2015-10-27 MED ORDER — DONEPEZIL HCL 5 MG PO TABS: 5.0000 mg | ORAL_TABLET | Freq: Every day | ORAL | Status: DC

## 2015-10-27 MED ORDER — LINACLOTIDE 145 MCG PO CAPS: ORAL_CAPSULE | ORAL | Status: DC

## 2015-10-27 NOTE — Assessment & Plan Note (Signed)
S/p shunt - remote Dr Manson PasseyBrown

## 2015-10-27 NOTE — Assessment & Plan Note (Addendum)
Chronic pain - worse. Pt is unable to work Dr Terrace ArabiaYan NS ref Dr Manson PasseyBrown Norco qid. Other options discussed. Pt declined pain clinic ref  Potential benefits of a long term opioids use as well as potential risks (i.e. addiction risk, apnea etc) and complications (i.e. Somnolence, constipation and others) were explained to the patient and were aknowledged.

## 2015-10-27 NOTE — Progress Notes (Signed)
Subjective:  Patient ID: Samantha [name], female    DOB: 1966/09/04  Age: 49 y.o. MRN: [id number]  CC: No chief complaint on file.   HPI MAKAILEY HODGKIN presents for memory loss and worsening pain in the legs L>>R - worse at nights; meds do not help. C/o pain in neck and shoulders - worse; severe - 6/10. On Percocet 3-4/d. 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     In Mar 27 2013, she got up in the morning, trying to get off the bed, she suddenly felt something pulling in her neck, in the same time, she noticed numbness in her left arm, neck, but sparing left face, she also has some subjective weakness, the dense numbness lasting for 24 hours, much improved now, but she still not back to her normal self she still has intermittent numbness tingling involving the left arm, left leg   MRI of the brain in April 2014 showed moderate enlargement of the third and lateral ventricles, unchanged. There is a right parietal shunt catheter extending into the right lateral ventricle with the tip in the temporal horn. This is unchanged. The fourth ventricle is not dilated. Right frontal craniotomy. Encephalomalacia right frontal lobe related to prior surgery. There is enlargement of the right frontal horn related to volume loss in the right frontal lobe.  MRI cervical showed focal medial foraminal disc protrusion at C5-6. Lateral foraminal disc osteophyte complex on the right at C7- T1.  Electrodiagnostic study was normal, there was no evidence of right cervical radiculopathy.  She had a history of obstructive sleep sleep apnea, she used to use CPAP machine, but has not  had her settings checked for many years, she complains of nighttime snoring, excessive daytime fatigue, sleepiness,   Past Medical History  Diagnosis Date  . Migraine   . Hyperlipidemia   . Cerebral ventricular shunt fitting or adjustment   . Anxiety   . Depression   . COMMON MIGRAINE 10/01/2007    Chronic    . GLUCOSE INTOLERANCE 10/01/2007    Qualifier: Diagnosis of  By: Jonny Ruiz MD, Len Blalock   . HYPERLIPIDEMIA 10/01/2007    Chronic. Lovaza is too $$$ Declined statins due to worries   . OBSTRUCTIVE SLEEP APNEA 10/01/2007    NPSG 2006:  AHI 9/hr Started cpap 2009 successfully.     . Paresthesia   . Sleep apnea   . Anemia   . GERD (gastroesophageal reflux disease)   . Stroke (HCC) 03/2013  . Memory loss   . Hiatal hernia   . Schatzki's ring   . Hydrocephalus   . IDA (iron deficiency anemia)    Past Surgical History  Procedure Laterality Date  . Cholecystectomy  1981  . Pituitary cyst w/subsequent shunt    . Cyst removal neck    . [place] shunt  1976,    cerebral vascular shunt/ with a revision 1981    reports that she has never smoked. She has never used smokeless tobacco. She reports that she drinks about 0.6 oz of alcohol per week. She reports that she does not use illicit drugs. family history includes Alzheimer's disease in her mother and  paternal grandmother; Brain cancer in her maternal aunt; Colon polyps in her mother; Dementia in her mother; Diabetes in her father; Esophageal cancer in her maternal grandmother; Heart disease in her father; Kidney cancer in her mother; Lung cancer in her maternal uncle, other, and other; Melanoma in her other; Migraines in her mother. There is no history of Colon cancer. Allergies  Allergen Reactions  . Citalopram Hydrobromide     REACTION: low libido        Outpatient Prescriptions Prior to Visit  Medication Sig Dispense Refill  . aspirin 81 MG tablet Take 81 mg by mouth daily.    Marland Kitchen atorvastatin (LIPITOR) 20 MG tablet TAKE 1 TABLET  (20 MG TOTAL) BY MOUTH DAILY. 90 tablet 3  . Calcium Carbonate-Vitamin D (CALCIUM-VITAMIN D) 500-200 MG-UNIT per tablet Take 1 tablet by mouth 2 (two) times daily with a meal.    . DULoxetine (CYMBALTA) 60 MG capsule Take 1 capsule (60 mg total) by mouth daily. 90 capsule 3  . escitalopram (LEXAPRO) 10 MG tablet TAKE 1 TABLET BY MOUTH EVERY DAY 90 tablet 3  . lamoTRIgine (LAMICTAL) 100 MG tablet One tab in am, one and half every night 240 tablet 3  . LORazepam (ATIVAN) 1 MG tablet Take 1 tablet (1 mg total) by mouth 2 (two) times daily as needed for anxiety. 180 tablet 1  . omega-3 acid ethyl esters (LOVAZA) 1 G capsule TAKE 2 CAPSULES (2 G TOTAL) BY MOUTH 2 (TWO) TIMES DAILY. 360 capsule 3  . ranitidine (ZANTAC) 150 MG tablet TAKE 1 TABLET BY MOUTH TWICE A DAY X 2 WEEKS,THEN ONCE DAILY 60 tablet 0  . SUMAtriptan (IMITREX) 100 MG tablet Take 1 tablet (100 mg total) by mouth daily as needed. 10 tablet 11  . HYDROcodone-acetaminophen (NORCO) 7.5-325 MG tablet Take 1 tablet by mouth 3 (three) times daily as needed for severe pain. 7 tablet 0  . Phosphatidylserine-DHA-EPA (VAYACOG) 100-19.5-6.5 MG CAPS Take 1 capsule by mouth daily. (Patient not taking: Reported on 10/27/2015) 30 capsule 11  . LINZESS 145 MCG CAPS capsule TAKE 1 CAPSULE (145 MCG TOTAL) BY MOUTH DAILY. (Patient not taking: Reported on 10/27/2015) 90 capsule 0   No facility-administered medications prior to visit.    ROS Review of Systems  Constitutional: Positive for fatigue. Negative for chills, activity change, appetite change and unexpected weight change.  HENT: Negative for congestion, mouth sores and sinus pressure.   Eyes: Negative for visual disturbance.  Respiratory: Negative for cough and chest tightness.   Gastrointestinal: Negative for nausea, abdominal pain and blood in stool.  Genitourinary: Negative for frequency, difficulty urinating and vaginal pain.  Musculoskeletal: Positive for back pain, arthralgias and neck  pain. Negative for joint swelling and gait problem.  Skin: Negative for pallor and rash.  Neurological: Negative for dizziness, tremors, weakness, numbness and headaches.  Psychiatric/Behavioral: Negative for suicidal ideas, confusion and sleep disturbance. The patient is not nervous/anxious.     Objective:  BP 130/84 mmHg  Pulse 94  Wt 134 lb (60.782 kg)  SpO2 97%  LMP 10/21/2015  BP Readings from Last 3 Encounters:  10/27/15 130/84  08/27/15 130/90  08/04/15 145/87    Wt Readings from Last 3 Encounters:  10/27/15 134 lb (60.782 kg)  08/27/15 136 lb (61.689 kg)  08/04/15 133 lb (60.328 kg)    Physical Exam  Constitutional: She appears well-developed. No distress.  HENT:  Head: Normocephalic.  Right Ear: External ear normal.  Left Ear: External ear normal.  Nose: Nose  normal.  Mouth/Throat: Oropharynx is clear and moist.  Eyes: Conjunctivae are normal. Pupils are equal, round, and reactive to light. Right eye exhibits no discharge. Left eye exhibits no discharge.  Neck: Normal range of motion. Neck supple. No JVD present. No tracheal deviation present. No thyromegaly present.  Cardiovascular: Normal rate, regular rhythm and normal heart sounds.   Pulmonary/Chest: No stridor. No respiratory distress. She has no wheezes.  Abdominal: Soft. Bowel sounds are normal. She exhibits no distension and no mass. There is no tenderness. There is no rebound and no guarding.  Musculoskeletal: She exhibits tenderness. She exhibits no edema.  Lymphadenopathy:    She has no cervical adenopathy.  Neurological: She displays normal reflexes. No cranial nerve deficit. She exhibits normal muscle tone. Coordination normal.  Skin: No rash noted. No erythema.  Psychiatric: She has a normal mood and affect. Her behavior is normal. Judgment and thought content normal.  short neck A/o/c  Lab Results  Component Value Date   WBC 7.2 07/12/2015   HGB 13.1 07/12/2015   HCT 38.3 07/12/2015   PLT  344 07/12/2015   GLUCOSE 90 07/12/2015   CHOL 250* 07/08/2014   TRIG 115.0 07/08/2014   HDL 42.50 07/08/2014   LDLDIRECT 207.7 05/02/2011   LDLCALC 185* 07/08/2014   ALT 15 07/12/2015   AST 21 07/12/2015   NA 138 07/12/2015   K 3.9 07/12/2015   CL 104 07/12/2015   CREATININE 1.02* 07/12/2015   BUN 14 07/12/2015   CO2 24 07/12/2015   TSH 4.05 07/08/2014   HGBA1C 5.8 08/29/2014    Dg Skull 1-3 Views  10/21/2015  CLINICAL DATA:  Memory loss.  Evaluate shunt. EXAM: SKULL - 1-3 VIEW COMPARISON:  None. FINDINGS: Visualized shunt catheter in the right neck, entering in the right posterior occipital region. It is difficult to visualize the portion of the shunt overlying the posterior calvarium. Remainder the shunt appears intact. IMPRESSION: Right shunt catheter as above. Electronically Signed   By: Charlett NoseKevin  Dover M.D.   On: 10/21/2015 13:30   Dg Chest 1 View  10/21/2015  CLINICAL DATA:  Assess status of a ventriculoperitoneal shunt placed 40 years ago; patient reports memory loss and intractable headache. EXAM: CHEST 1 VIEW COMPARISON:  PA and lateral chest x-ray of July 12, 2015 FINDINGS: The ventriculoperitoneal shunt tube is visible extending from the base of the neck to the lower aspect of the right hemi- thorax but it is difficult to follow it more inferiorly until approximately T11 where it becomes visible again. Visualization is hampered by the prominent dextrocurvature of the lower thoracic spine. Without a lateral view it is difficult to comment further on the shunt. The lungs are clear. The heart is top-normal in size. The pulmonary vascularity is normal. There is moderate S shaped thoracolumbar scoliosis. IMPRESSION: The ventriculoperitoneal shunt tube appears intact down to approximately T8 where it is lost due to overlying bony structures. It is again demonstrated at approximately T11. One cannot exclude discontinuity of the lower thoracic portion of the shunt. Electronically Signed    By: David  SwazilandJordan M.D.   On: 10/21/2015 13:31   Dg Abd 1 View  10/21/2015  CLINICAL DATA:  Evaluate shunt. EXAM: ABDOMEN - 1 VIEW COMPARISON:  None. FINDINGS: The shunt tip is in the right upper quadrant. The bowel gas pattern is unremarkable. The soft tissue shadows are maintained. No worrisome calcifications. The bony structures are intact. IMPRESSION: Shunt tip in the right upper quadrant. Unremarkable abdominal radiograph. Electronically Signed  By: Rudie Meyer M.D.   On: 10/21/2015 13:30   Ct Head Wo Contrast  10/21/2015  CLINICAL DATA:  49 year old female with chronic shunt since the 1970s. Recurrent headaches. Initial encounter. EXAM: CT HEAD WITHOUT CONTRAST TECHNIQUE: Contiguous axial images were obtained from the base of the skull through the vertex without intravenous contrast. COMPARISON:  Brain MRI 07/12/2015 and earlier. FINDINGS: Sequelae of right frontotemporal craniotomy. There is an intracranial surgical clip at the central skullbase just above the posterior clinoid. This was seen to be near the basilar artery tip on the recent MRI. There is a E right posterior convexity approach ventriculostomy shunt. No acute osseous abnormality identified. Visualized paranasal sinuses and mastoids are clear. Negative visualized orbits soft tissues. Chronic scalp soft tissue scarring. Posterior right scalp shunt reservoir with tubing which descends in the posterior right neck soft tissues. Chronic ventriculomegaly appears stable since August. The current shunt catheter courses to the right temporal horn and appears stable. Chronic encephalomalacia overlying the right frontal horn. No CT evidence of transependymal edema or acute cortically based infarct. No acute intracranial hemorrhage identified. No midline shift or acute intracranial mass effect. IMPRESSION: 1. Stable appearance of the brain since the MRI in August. No transependymal edema. 2. Chronic ventriculomegaly and right frontal horn region  encephalomalacia. 3. Chronic postoperative changes including prior right frontotemporal craniotomy, and chronic surgical clip at the central skullbase near the basilar artery. Electronically Signed   By: Odessa Fleming M.D.   On: 10/21/2015 15:59    Assessment & Plan:   Diagnoses and all orders for this visit:  Slow transit constipation  Memory loss  Neck pain, chronic  Other orders -     Discontinue: HYDROcodone-acetaminophen (NORCO) 10-325 MG tablet; Take 0.5-1 tablets by mouth every 6 (six) hours as needed for severe pain. -     donepezil (ARICEPT) 5 MG tablet; Take 1 tablet (5 mg total) by mouth at bedtime. -     HYDROcodone-acetaminophen (NORCO) 10-325 MG tablet; Take 0.5-1 tablets by mouth every 6 (six) hours as needed for severe pain. Please fill on or after 11/26/14 -     Linaclotide (LINZESS) 145 MCG CAPS capsule; TAKE 1 CAPSULE (145 MCG TOTAL) BY MOUTH DAILY.  I have discontinued Ms. Brenn HYDROcodone-acetaminophen. I have changed her LINZESS to Linaclotide. I have also changed her HYDROcodone-acetaminophen. Additionally, I am having her start on donepezil. Lastly, I am having her maintain her SUMAtriptan, calcium-vitamin D, escitalopram, aspirin, DULoxetine, Phosphatidylserine-DHA-EPA, omega-3 acid ethyl esters, atorvastatin, LORazepam, lamoTRIgine, and ranitidine.  Meds ordered this encounter  Medications  . DISCONTD: HYDROcodone-acetaminophen (NORCO) 10-325 MG tablet    Sig: Take 0.5-1 tablets by mouth every 6 (six) hours as needed for severe pain.    Dispense:  120 tablet    Refill:  0  . donepezil (ARICEPT) 5 MG tablet    Sig: Take 1 tablet (5 mg total) by mouth at bedtime.    Dispense:  30 tablet    Refill:  3  . HYDROcodone-acetaminophen (NORCO) 10-325 MG tablet    Sig: Take 0.5-1 tablets by mouth every 6 (six) hours as needed for severe pain. Please fill on or after 11/26/14    Dispense:  120 tablet    Refill:  0  . Linaclotide (LINZESS) 145 MCG CAPS capsule    Sig:  TAKE 1 CAPSULE (145 MCG TOTAL) BY MOUTH DAILY.    Dispense:  90 capsule    Refill:  3     Follow-up: Return in about 2  months (around 12/28/2015) for a follow-up visit.  Sonda Primes, MD

## 2015-10-27 NOTE — Assessment & Plan Note (Signed)
Situational 2014 - due to illness/CVA On Cymbalta

## 2015-10-27 NOTE — Assessment & Plan Note (Signed)
S/p shunt - remote Dr Terrace ArabiaYan

## 2015-10-27 NOTE — Assessment & Plan Note (Signed)
Dr Terrace ArabiaYan: sx's suggestive of a right internal capsule thalamus small vessel disease, which can be missed by MRI scan, MRI of the brain showed stable chronic changes detailed above, there was no acute lesions

## 2015-10-27 NOTE — Progress Notes (Signed)
Pre visit review using our clinic review tool, if applicable. No additional management support is needed unless otherwise documented below in the visit note. 

## 2015-10-27 NOTE — Assessment & Plan Note (Signed)
Linzess 

## 2015-10-27 NOTE — Assessment & Plan Note (Signed)
Try aricept

## 2015-10-30 ENCOUNTER — Ambulatory Visit: Payer: BLUE CROSS/BLUE SHIELD | Admitting: Internal Medicine

## 2015-12-13 ENCOUNTER — Other Ambulatory Visit: Payer: Self-pay | Admitting: Neurology

## 2015-12-15 ENCOUNTER — Encounter: Payer: Self-pay | Admitting: Internal Medicine

## 2015-12-15 ENCOUNTER — Ambulatory Visit (INDEPENDENT_AMBULATORY_CARE_PROVIDER_SITE_OTHER): Payer: BLUE CROSS/BLUE SHIELD | Admitting: Internal Medicine

## 2015-12-15 ENCOUNTER — Ambulatory Visit: Payer: BLUE CROSS/BLUE SHIELD | Admitting: Internal Medicine

## 2015-12-15 VITALS — BP 160/110 | HR 103 | Wt 135.0 lb

## 2015-12-15 DIAGNOSIS — K5901 Slow transit constipation: Secondary | ICD-10-CM

## 2015-12-15 DIAGNOSIS — J069 Acute upper respiratory infection, unspecified: Secondary | ICD-10-CM

## 2015-12-15 MED ORDER — AZITHROMYCIN 250 MG PO TABS: ORAL_TABLET | ORAL | Status: DC

## 2015-12-15 MED ORDER — LORAZEPAM 1 MG PO TABS: 1.0000 mg | ORAL_TABLET | Freq: Two times a day (BID) | ORAL | Status: DC | PRN

## 2015-12-15 NOTE — Progress Notes (Signed)
Subjective:  Patient ID: Samantha [name], female    DOB: [birth year]  Age: 50 y.o. MRN: [id number]  CC: No chief complaint on file.   HPI Samantha [name] presents for URI sx's x 1 week  Outpatient Prescriptions Prior to Visit  Medication Sig Dispense Refill  . aspirin 81 MG tablet Take 81 mg by mouth daily.    Marland Kitchen atorvastatin (LIPITOR) 20 MG tablet TAKE 1 TABLET (20 MG TOTAL) BY MOUTH DAILY. 90 tablet 3  . Calcium Carbonate-Vitamin D (CALCIUM-VITAMIN D) 500-200 MG-UNIT per tablet Take 1 tablet by mouth 2 (two) times daily with a meal.    . donepezil (ARICEPT) 5 MG tablet Take 1 tablet (5 mg total) by mouth at bedtime. 30 tablet 3  . escitalopram (LEXAPRO) 10 MG tablet TAKE 1 TABLET BY MOUTH EVERY DAY 90 tablet 3  . HYDROcodone-acetaminophen (NORCO) 10-325 MG tablet Take 0.5-1 tablets by mouth every 6 (six) hours as needed for severe pain. Please fill on or after 11/26/14 120 tablet 0  . lamoTRIgine (LAMICTAL) 100 MG tablet One tab in am, one and half every night 240 tablet 3  . Linaclotide (LINZESS) 145 MCG CAPS capsule TAKE 1 CAPSULE (145 MCG TOTAL) BY MOUTH DAILY. 90 capsule 3  . omega-3 acid ethyl esters (LOVAZA) 1 G capsule TAKE 2 CAPSULES (2 G TOTAL) BY MOUTH 2 (TWO) TIMES DAILY. 360 capsule 3  . Phosphatidylserine-DHA-EPA (VAYACOG) 100-19.5-6.5 MG CAPS Take 1 capsule by mouth daily. 30 capsule 11  . ranitidine (ZANTAC) 150 MG tablet TAKE 1 TABLET BY MOUTH TWICE A DAY X 2 WEEKS,THEN ONCE DAILY 60 tablet 0  . SUMAtriptan (IMITREX) 100 MG tablet Take 1 tablet (100 mg total) by mouth daily as needed. 10 tablet 11  . DULoxetine (CYMBALTA) 60 MG capsule Take 1 capsule (60 mg total) by mouth daily. 90 capsule 3  . LORazepam (ATIVAN) 1 MG tablet Take 1 tablet (1 mg total) by mouth 2 (two) times daily as needed for anxiety. 180 tablet 1   No facility-administered medications prior to visit.    ROS Review of Systems  Constitutional: Negative for chills, activity change, appetite change,  fatigue and unexpected weight change.  HENT: Positive for congestion, rhinorrhea and sore throat. Negative for mouth sores and sinus pressure.   Eyes: Negative for visual disturbance.  Respiratory: Positive for cough. Negative for chest tightness and wheezing.   Gastrointestinal: Negative for nausea and abdominal pain.  Genitourinary: Negative for frequency, difficulty urinating and vaginal pain.  Musculoskeletal: Negative for back pain and gait problem.  Skin: Negative for pallor and rash.  Neurological: Negative for dizziness, tremors, weakness, numbness and headaches.  Psychiatric/Behavioral: Negative for confusion and sleep disturbance.    Objective:  BP 160/110 mmHg  Pulse 103  Wt 135 lb (61.236 kg)  SpO2 98%  BP Readings from Last 3 Encounters:  12/15/15 160/110  10/27/15 130/84  08/27/15 130/90    Wt Readings from Last 3 Encounters:  12/15/15 135 lb (61.236 kg)  10/27/15 134 lb (60.782 kg)  08/27/15 136 lb (61.689 kg)    Physical Exam  Constitutional: She appears well-developed. No distress.  HENT:  Head: Normocephalic.  Right Ear: External ear normal.  Left Ear: External ear normal.  Nose: Nose normal.  Mouth/Throat: Oropharynx is clear and moist.  Eyes: Conjunctivae are normal. Pupils are equal, round, and reactive to light. Left eye exhibits no discharge.  Neck: Normal range of motion. Neck supple. No JVD present. No tracheal deviation present. No thyromegaly  present.  Cardiovascular: Normal rate, regular rhythm and normal heart sounds.   Pulmonary/Chest: No stridor. No respiratory distress. She has no wheezes.  Abdominal: Soft. Bowel sounds are normal. She exhibits no distension and no mass. There is no tenderness. There is no rebound and no guarding.  Musculoskeletal: She exhibits no edema or tenderness.  Lymphadenopathy:    She has no cervical adenopathy.  Neurological: She displays normal reflexes. No cranial nerve deficit. She exhibits normal muscle tone.  Coordination normal.  Skin: No rash noted. No erythema.  Psychiatric: She has a normal mood and affect. Her behavior is normal. Judgment and thought content normal.  eryth throat, nares  Lab Results  Component Value Date   WBC 7.2 07/12/2015   HGB 13.1 07/12/2015   HCT 38.3 07/12/2015   PLT 344 07/12/2015   GLUCOSE 90 07/12/2015   CHOL 250* 07/08/2014   TRIG 115.0 07/08/2014   HDL 42.50 07/08/2014   LDLDIRECT 207.7 05/02/2011   LDLCALC 185* 07/08/2014   ALT 15 07/12/2015   AST 21 07/12/2015   NA 138 07/12/2015   K 3.9 07/12/2015   CL 104 07/12/2015   CREATININE 1.02* 07/12/2015   BUN 14 07/12/2015   CO2 24 07/12/2015   TSH 4.05 07/08/2014   HGBA1C 5.8 08/29/2014    Dg Skull 1-3 Views  10/21/2015  CLINICAL DATA:  Memory loss.  Evaluate shunt. EXAM: SKULL - 1-3 VIEW COMPARISON:  None. FINDINGS: Visualized shunt catheter in the right neck, entering in the right posterior occipital region. It is difficult to visualize the portion of the shunt overlying the posterior calvarium. Remainder the shunt appears intact. IMPRESSION: Right shunt catheter as above. Electronically Signed   By: Charlett Nose M.D.   On: 10/21/2015 13:30   Dg Chest 1 View  10/21/2015  CLINICAL DATA:  Assess status of a ventriculoperitoneal shunt placed 40 years ago; patient reports memory loss and intractable headache. EXAM: CHEST 1 VIEW COMPARISON:  PA and lateral chest x-ray of July 12, 2015 FINDINGS: The ventriculoperitoneal shunt tube is visible extending from the base of the neck to the lower aspect of the right hemi- thorax but it is difficult to follow it more inferiorly until approximately T11 where it becomes visible again. Visualization is hampered by the prominent dextrocurvature of the lower thoracic spine. Without a lateral view it is difficult to comment further on the shunt. The lungs are clear. The heart is top-normal in size. The pulmonary vascularity is normal. There is moderate S shaped  thoracolumbar scoliosis. IMPRESSION: The ventriculoperitoneal shunt tube appears intact down to approximately T8 where it is lost due to overlying bony structures. It is again demonstrated at approximately T11. One cannot exclude discontinuity of the lower thoracic portion of the shunt. Electronically Signed   By: David  [place] M.D.   On: 10/21/2015 13:31   Dg Abd 1 View  10/21/2015  CLINICAL DATA:  Evaluate shunt. EXAM: ABDOMEN - 1 VIEW COMPARISON:  None. FINDINGS: The shunt tip is in the right upper quadrant. The bowel gas pattern is unremarkable. The soft tissue shadows are maintained. No worrisome calcifications. The bony structures are intact. IMPRESSION: Shunt tip in the right upper quadrant. Unremarkable abdominal radiograph. Electronically Signed   By: Rudie Meyer M.D.   On: 10/21/2015 13:30   Ct Head Wo Contrast  10/21/2015  CLINICAL DATA:  50 year old female with chronic shunt since the 1970s. Recurrent headaches. Initial encounter. EXAM: CT HEAD WITHOUT CONTRAST TECHNIQUE: Contiguous axial images were obtained from the base  of the skull through the vertex without intravenous contrast. COMPARISON:  Brain MRI 07/12/2015 and earlier. FINDINGS: Sequelae of right frontotemporal craniotomy. There is an intracranial surgical clip at the central skullbase just above the posterior clinoid. This was seen to be near the basilar artery tip on the recent MRI. There is a E right posterior convexity approach ventriculostomy shunt. No acute osseous abnormality identified. Visualized paranasal sinuses and mastoids are clear. Negative visualized orbits soft tissues. Chronic scalp soft tissue scarring. Posterior right scalp shunt reservoir with tubing which descends in the posterior right neck soft tissues. Chronic ventriculomegaly appears stable since August. The current shunt catheter courses to the right temporal horn and appears stable. Chronic encephalomalacia overlying the right frontal horn. No CT evidence of  transependymal edema or acute cortically based infarct. No acute intracranial hemorrhage identified. No midline shift or acute intracranial mass effect. IMPRESSION: 1. Stable appearance of the brain since the MRI in August. No transependymal edema. 2. Chronic ventriculomegaly and right frontal horn region encephalomalacia. 3. Chronic postoperative changes including prior right frontotemporal craniotomy, and chronic surgical clip at the central skullbase near the basilar artery. Electronically Signed   By: Odessa Fleming M.D.   On: 10/21/2015 15:59    Assessment & Plan:   There are no diagnoses linked to this encounter. I am having Ms. Dec start on azithromycin. I am also having her maintain her SUMAtriptan, calcium-vitamin D, escitalopram, aspirin, Phosphatidylserine-DHA-EPA, omega-3 acid ethyl esters, atorvastatin, lamoTRIgine, ranitidine, donepezil, HYDROcodone-acetaminophen, Linaclotide, and LORazepam.  Meds ordered this encounter  Medications  . LORazepam (ATIVAN) 1 MG tablet    Sig: Take 1 tablet (1 mg total) by mouth 2 (two) times daily as needed for anxiety.    Dispense:  180 tablet    Refill:  0  . azithromycin (ZITHROMAX) 250 MG tablet    Sig: As directed    Dispense:  6 tablet    Refill:  0     Follow-up: No Follow-up on file.  Sonda Primes, MD

## 2015-12-15 NOTE — Assessment & Plan Note (Signed)
OTC meds 

## 2015-12-15 NOTE — Progress Notes (Signed)
Pre visit review using our clinic review tool, if applicable. No additional management support is needed unless otherwise documented below in the visit note. 

## 2015-12-15 NOTE — Assessment & Plan Note (Signed)
Zpac 

## 2015-12-25 ENCOUNTER — Telehealth: Payer: Self-pay | Admitting: *Deleted

## 2015-12-25 NOTE — Telephone Encounter (Signed)
Receive call pt is wanting refill on her hydrocodone...Samantha [name]

## 2015-12-25 NOTE — Telephone Encounter (Signed)
ok Thx 

## 2015-12-28 MED ORDER — HYDROCODONE-ACETAMINOPHEN 10-325 MG PO TABS: 0.5000 | ORAL_TABLET | Freq: Four times a day (QID) | ORAL | Status: DC | PRN

## 2015-12-28 NOTE — Telephone Encounter (Signed)
Notified pt rx ready for pick-up.../lmb 

## 2015-12-29 ENCOUNTER — Ambulatory Visit: Payer: BLUE CROSS/BLUE SHIELD | Admitting: Internal Medicine

## 2016-01-25 ENCOUNTER — Other Ambulatory Visit: Payer: Self-pay | Admitting: Internal Medicine

## 2016-01-25 NOTE — Telephone Encounter (Signed)
Patient is requesting refill on hydrocodone  °

## 2016-01-27 MED ORDER — HYDROCODONE-ACETAMINOPHEN 10-325 MG PO TABS: 0.5000 | ORAL_TABLET | Freq: Four times a day (QID) | ORAL | Status: DC | PRN

## 2016-01-27 NOTE — Telephone Encounter (Signed)
Ok per PCP. Rx printed/signed/given to pt.

## 2016-02-01 ENCOUNTER — Ambulatory Visit: Payer: BLUE CROSS/BLUE SHIELD | Admitting: Nurse Practitioner

## 2016-02-04 ENCOUNTER — Ambulatory Visit: Payer: BLUE CROSS/BLUE SHIELD | Admitting: Nurse Practitioner

## 2016-02-25 ENCOUNTER — Telehealth: Payer: Self-pay | Admitting: Internal Medicine

## 2016-02-25 ENCOUNTER — Encounter: Payer: Self-pay | Admitting: Internal Medicine

## 2016-02-25 MED ORDER — HYDROCODONE-ACETAMINOPHEN 10-325 MG PO TABS: 0.5000 | ORAL_TABLET | Freq: Four times a day (QID) | ORAL | Status: DC | PRN

## 2016-02-25 NOTE — Telephone Encounter (Signed)
Please advise, Dr. Macario GoldsPlot is out for today and pt will be running out this Sat.

## 2016-02-25 NOTE — Telephone Encounter (Signed)
Pt aware.

## 2016-02-25 NOTE — Telephone Encounter (Signed)
Pt request refill for HYDROcodone-acetaminophen (NORCO) 10-325 MG tablet, pt was wondering if she can come pick it up today, advise Dr. Macario GoldsPlot is out of the office already and [facility].

## 2016-02-25 NOTE — Telephone Encounter (Signed)
Medication refilled

## 2016-02-29 ENCOUNTER — Other Ambulatory Visit: Payer: Self-pay

## 2016-02-29 ENCOUNTER — Telehealth: Payer: Self-pay

## 2016-02-29 NOTE — Telephone Encounter (Signed)
OK x12 mo Thx 

## 2016-02-29 NOTE — Telephone Encounter (Signed)
Please advise patient is requesting refill on aricept

## 2016-03-01 ENCOUNTER — Other Ambulatory Visit: Payer: Self-pay

## 2016-03-01 MED ORDER — DONEPEZIL HCL 5 MG PO TABS: 5.0000 mg | ORAL_TABLET | Freq: Every day | ORAL | Status: DC

## 2016-03-01 NOTE — Telephone Encounter (Signed)
Rx was printed faxed script to

## 2016-03-01 NOTE — Telephone Encounter (Signed)
[facility]

## 2016-03-25 ENCOUNTER — Telehealth: Payer: Self-pay | Admitting: *Deleted

## 2016-03-25 ENCOUNTER — Other Ambulatory Visit: Payer: Self-pay | Admitting: Family

## 2016-03-25 MED ORDER — HYDROCODONE-ACETAMINOPHEN 10-325 MG PO TABS: 0.5000 | ORAL_TABLET | Freq: Four times a day (QID) | ORAL | Status: DC | PRN

## 2016-03-25 NOTE — Telephone Encounter (Signed)
OK to fill this prescription with additional refills x0 Thank you!  

## 2016-03-25 NOTE — Telephone Encounter (Signed)
Receive call pt requesting refills on her Hydrocodone...Samantha [name]/lmb

## 2016-03-28 NOTE — Telephone Encounter (Signed)
Pt came in on Friday requesting status on refill. Dr. Macario GoldsPlot left early so Tammy SoursGreg approved refill & rx was given to pt...[name]/lmb

## 2016-04-08 ENCOUNTER — Ambulatory Visit: Payer: BLUE CROSS/BLUE SHIELD | Admitting: Physical Medicine & Rehabilitation

## 2016-04-27 ENCOUNTER — Telehealth: Payer: Self-pay | Admitting: *Deleted

## 2016-04-27 NOTE — Telephone Encounter (Signed)
Patient showed up to the office wanting her RX. She was informed the doctor has 24 to 48 hours to get back to her on her request.

## 2016-04-27 NOTE — Telephone Encounter (Signed)
Pt left msg on triage requesting refill on her Hydrocodone.../lmb 

## 2016-04-27 NOTE — Telephone Encounter (Signed)
OK to fill this prescription with additional refills x0 OV q 3 mo Thank you!  

## 2016-04-28 ENCOUNTER — Telehealth: Payer: Self-pay

## 2016-04-28 ENCOUNTER — Encounter: Payer: Self-pay | Admitting: Physical Medicine & Rehabilitation

## 2016-04-28 ENCOUNTER — Ambulatory Visit (HOSPITAL_BASED_OUTPATIENT_CLINIC_OR_DEPARTMENT_OTHER): Payer: Medicare Other | Admitting: Physical Medicine & Rehabilitation

## 2016-04-28 ENCOUNTER — Encounter: Payer: Medicare Other | Attending: Physical Medicine & Rehabilitation

## 2016-04-28 VITALS — BP 136/93 | HR 97 | Resp 17

## 2016-04-28 DIAGNOSIS — E785 Hyperlipidemia, unspecified: Secondary | ICD-10-CM | POA: Insufficient documentation

## 2016-04-28 DIAGNOSIS — G4733 Obstructive sleep apnea (adult) (pediatric): Secondary | ICD-10-CM | POA: Insufficient documentation

## 2016-04-28 DIAGNOSIS — Z5181 Encounter for therapeutic drug level monitoring: Secondary | ICD-10-CM | POA: Insufficient documentation

## 2016-04-28 DIAGNOSIS — R413 Other amnesia: Secondary | ICD-10-CM | POA: Insufficient documentation

## 2016-04-28 DIAGNOSIS — Z79899 Other long term (current) drug therapy: Secondary | ICD-10-CM | POA: Diagnosis not present

## 2016-04-28 DIAGNOSIS — F329 Major depressive disorder, single episode, unspecified: Secondary | ICD-10-CM | POA: Insufficient documentation

## 2016-04-28 DIAGNOSIS — G919 Hydrocephalus, unspecified: Secondary | ICD-10-CM | POA: Insufficient documentation

## 2016-04-28 DIAGNOSIS — D649 Anemia, unspecified: Secondary | ICD-10-CM | POA: Diagnosis not present

## 2016-04-28 DIAGNOSIS — G43909 Migraine, unspecified, not intractable, without status migrainosus: Secondary | ICD-10-CM | POA: Insufficient documentation

## 2016-04-28 DIAGNOSIS — F419 Anxiety disorder, unspecified: Secondary | ICD-10-CM | POA: Insufficient documentation

## 2016-04-28 DIAGNOSIS — M5136 Other intervertebral disc degeneration, lumbar region: Secondary | ICD-10-CM

## 2016-04-28 DIAGNOSIS — M542 Cervicalgia: Secondary | ICD-10-CM

## 2016-04-28 DIAGNOSIS — G8929 Other chronic pain: Secondary | ICD-10-CM | POA: Insufficient documentation

## 2016-04-28 DIAGNOSIS — G894 Chronic pain syndrome: Secondary | ICD-10-CM

## 2016-04-28 DIAGNOSIS — Z8673 Personal history of transient ischemic attack (TIA), and cerebral infarction without residual deficits: Secondary | ICD-10-CM | POA: Insufficient documentation

## 2016-04-28 DIAGNOSIS — K219 Gastro-esophageal reflux disease without esophagitis: Secondary | ICD-10-CM | POA: Diagnosis not present

## 2016-04-28 DIAGNOSIS — M51369 Other intervertebral disc degeneration, lumbar region without mention of lumbar back pain or lower extremity pain: Secondary | ICD-10-CM

## 2016-04-28 DIAGNOSIS — M5416 Radiculopathy, lumbar region: Secondary | ICD-10-CM | POA: Insufficient documentation

## 2016-04-28 MED ORDER — TRAMADOL HCL 50 MG PO TABS: 50.0000 mg | ORAL_TABLET | Freq: Three times a day (TID) | ORAL | Status: DC | PRN

## 2016-04-28 MED ORDER — MELOXICAM 15 MG PO TABS: 15.0000 mg | ORAL_TABLET | Freq: Every day | ORAL | Status: DC

## 2016-04-28 MED ORDER — HYDROCODONE-ACETAMINOPHEN 10-325 MG PO TABS: 0.5000 | ORAL_TABLET | Freq: Four times a day (QID) | ORAL | Status: DC | PRN

## 2016-04-28 NOTE — Telephone Encounter (Signed)
call in mobic 15 mg daily #90, 3 month supply

## 2016-04-28 NOTE — Telephone Encounter (Signed)
Notified pt rx ready for pick-up.../lmb 
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
## 2016-04-28 NOTE — Telephone Encounter (Signed)
Mobic sent to pharmacy. Pt aware.

## 2016-04-28 NOTE — Telephone Encounter (Signed)
Pt states that her mobic was increased to 15 mg daily. She would like a 90 day supply [facility]. I do not see any documentation that the mobic was increased or any activity on her med list. Please advise?

## 2016-04-28 NOTE — Patient Instructions (Addendum)
Will need to check urine screen prior to narcotic prescription  Do not take ibuprofen with no pick. They are very similar and the combination can increase her chance for an ulcer or kidney failure

## 2016-04-29 NOTE — Telephone Encounter (Signed)
Patient called in states that she is now going through pain management.  She does not need script.  Pulled from cabinet and placed on Stacy's desk.

## 2016-04-29 NOTE — Telephone Encounter (Signed)
Voided script...[name]/lmb

## 2016-05-03 ENCOUNTER — Telehealth: Payer: Self-pay | Admitting: Physical Medicine & Rehabilitation

## 2016-05-03 NOTE — Telephone Encounter (Signed)
Called patient to let her know that no more pain medication can be given until her UDS comes back clear. I just checked with [facility] and it is still pending. I told her I would call her as soon as we had it back.

## 2016-05-03 NOTE — Telephone Encounter (Signed)
Patient having increased pain and medication is not working.  Patient would like to know what she can do.  Please call her at [phone].

## 2016-05-05 ENCOUNTER — Telehealth: Payer: Self-pay | Admitting: Internal Medicine

## 2016-05-05 ENCOUNTER — Telehealth: Payer: Self-pay | Admitting: Physical Medicine & Rehabilitation

## 2016-05-05 NOTE — Telephone Encounter (Signed)
Patient is calling back requesting medication.  She is having a lot of increased pain and would like for someone to call her back.

## 2016-05-05 NOTE — Telephone Encounter (Signed)
Pt states that she will no longer be a patient of this practice due to her "suffering from lack of medication". She states that she can not wait any longer for the UDS to result. She states she will find another pain management doctor to help her. FYI.

## 2016-05-05 NOTE — Telephone Encounter (Signed)
That is fine She can request a limited prescription from Dr. Posey ReaPlotnikov in the interval time

## 2016-05-05 NOTE — Telephone Encounter (Signed)
States she is in a lot of pain and pain management office took her off of meds that Plot had her on.  States she is needing something for pain.

## 2016-05-06 ENCOUNTER — Encounter: Payer: Self-pay | Admitting: Internal Medicine

## 2016-05-06 ENCOUNTER — Ambulatory Visit (INDEPENDENT_AMBULATORY_CARE_PROVIDER_SITE_OTHER): Payer: Medicare Other | Admitting: Internal Medicine

## 2016-05-06 VITALS — BP 130/80 | HR 86 | Wt 132.0 lb

## 2016-05-06 DIAGNOSIS — M545 Low back pain, unspecified: Secondary | ICD-10-CM

## 2016-05-06 DIAGNOSIS — G8929 Other chronic pain: Secondary | ICD-10-CM | POA: Diagnosis not present

## 2016-05-06 DIAGNOSIS — M79605 Pain in left leg: Secondary | ICD-10-CM

## 2016-05-06 DIAGNOSIS — M542 Cervicalgia: Secondary | ICD-10-CM | POA: Diagnosis not present

## 2016-05-06 MED ORDER — HYDROCODONE-ACETAMINOPHEN 10-325 MG PO TABS: 0.5000 | ORAL_TABLET | Freq: Four times a day (QID) | ORAL | Status: DC | PRN

## 2016-05-06 NOTE — Assessment & Plan Note (Addendum)
Worse Dr Manson PasseyBrown, a [place] in [place] ref the pt to Pain Clinic.The pt saw Dr Rikki SpearingKirsein's on 6/15 and given Rx for tramadol/Mobic - steroid injections were scheduled. Elon JesterMichele [facility] - appt 05/31/16 is pending...  Use Norco until appt at the Pain Clinic D/c Tramadol

## 2016-05-06 NOTE — Telephone Encounter (Signed)
Who did she see? What pain management was offered? Needs OV Thx

## 2016-05-06 NOTE — Progress Notes (Signed)
Pre visit review using our clinic review tool, if applicable. No additional management support is needed unless otherwise documented below in the visit note. 

## 2016-05-06 NOTE — Assessment & Plan Note (Signed)
Potential benefits of a long term opioids use as well as potential risks (i.e. addiction risk, apnea etc) and complications (i.e. Somnolence, constipation and others) were explained to the patient and were aknowledged. Re-start Norco Rx PRN temporarily. Samantha [name] [facility] - appt 05/31/16 is pending... Use Norco until appt at the Pain Clinic  D/c Tramadol

## 2016-05-06 NOTE — Progress Notes (Signed)
Subjective:  Patient ID: Samantha [name], female    DOB: 1966-06-06  Age: 50 y.o. MRN: [id number]  CC: Neck Pain; Leg Pain; and Shoulder Pain   HPI Samantha [name] presents for severe neck pain. "I can't move, can't sleep". "I've been crying w/pain" Dr Manson Passey, a [place] in [place] ref the pt to Pain Clinic.The pt saw Dr Rikki Spearing on 6/15 and given Rx for tramadol/Mobic - steroid injections were scheduled. Samantha [name] wants to switch to [facility] - appt 05/31/16 is pending...UDS is pending  Outpatient Prescriptions Prior to Visit  Medication Sig Dispense Refill  . aspirin 81 MG tablet Take 81 mg by mouth daily.    Marland Kitchen atorvastatin (LIPITOR) 20 MG tablet TAKE 1 TABLET (20 MG TOTAL) BY MOUTH DAILY. 90 tablet 3  . Calcium Carbonate-Vitamin D (CALCIUM-VITAMIN D) 500-200 MG-UNIT per tablet Take 1 tablet by mouth 2 (two) times daily with a meal.    . donepezil (ARICEPT) 5 MG tablet Take 1 tablet (5 mg total) by mouth at bedtime. 30 tablet 3  . DULoxetine (CYMBALTA) 60 MG capsule TAKE 1 CAPSULE (60 MG TOTAL) BY MOUTH DAILY. 90 capsule 2  . lamoTRIgine (LAMICTAL) 100 MG tablet One tab in am, one and half every night 240 tablet 3  . Linaclotide (LINZESS) 145 MCG CAPS capsule TAKE 1 CAPSULE (145 MCG TOTAL) BY MOUTH DAILY. 90 capsule 3  . LORazepam (ATIVAN) 1 MG tablet Take 1 tablet (1 mg total) by mouth 2 (two) times daily as needed for anxiety. 180 tablet 0  . meloxicam (MOBIC) 15 MG tablet Take 1 tablet (15 mg total) by mouth daily. 90 tablet 1  . omega-3 acid ethyl esters (LOVAZA) 1 G capsule TAKE 2 CAPSULES (2 G TOTAL) BY MOUTH 2 (TWO) TIMES DAILY. 360 capsule 3  . ranitidine (ZANTAC) 150 MG tablet TAKE 1 TABLET BY MOUTH TWICE A DAY X 2 WEEKS,THEN ONCE DAILY 60 tablet 0  . SUMAtriptan (IMITREX) 100 MG tablet Take 1 tablet (100 mg total) by mouth daily as needed. 10 tablet 11  . traMADol (ULTRAM) 50 MG tablet Take 1 tablet (50 mg total) by mouth every 8 (eight) hours as needed. 90  tablet 0  . HYDROcodone-acetaminophen (NORCO) 10-325 MG tablet Take 0.5-1 tablets by mouth every 6 (six) hours as needed for severe pain. (Patient not taking: Reported on 05/06/2016) 120 tablet 0   No facility-administered medications prior to visit.    ROS Review of Systems  Constitutional: Negative for chills, activity change, appetite change, fatigue and unexpected weight change.  HENT: Negative for congestion, mouth sores and sinus pressure.   Eyes: Negative for visual disturbance.  Respiratory: Negative for cough and chest tightness.   Gastrointestinal: Negative for nausea and abdominal pain.  Genitourinary: Negative for frequency, difficulty urinating and vaginal pain.  Musculoskeletal: Positive for back pain and neck pain. Negative for gait problem.  Skin: Negative for pallor and rash.  Neurological: Negative for dizziness, tremors, weakness, numbness and headaches.  Psychiatric/Behavioral: Positive for sleep disturbance and decreased concentration. Negative for suicidal ideas and confusion.    Objective:  BP 130/80 mmHg  Pulse 86  Wt 132 lb (59.875 kg)  SpO2 98%  LMP  (Within Weeks)  BP Readings from Last 3 Encounters:  05/06/16 130/80  04/28/16 136/93  12/15/15 160/110    Wt Readings from Last 3 Encounters:  05/06/16 132 lb (59.875 kg)  12/15/15 135 lb (61.236 kg)  10/27/15 134 lb (60.782 kg)    Physical Exam  Constitutional: She appears well-developed. No distress.  HENT:  Head: Normocephalic.  Right Ear: External ear normal.  Left Ear: External ear normal.  Nose: Nose normal.  Mouth/Throat: Oropharynx is clear and moist.  Eyes: Conjunctivae are normal. Pupils are equal, round, and reactive to light. Right eye exhibits no discharge. Left eye exhibits no discharge.  Neck: Normal range of motion. Neck supple. No JVD present. No tracheal deviation present. No thyromegaly present.  Cardiovascular: Normal rate, regular rhythm and normal heart sounds.     Pulmonary/Chest: No stridor. No respiratory distress. She has no wheezes.  Abdominal: Soft. Bowel sounds are normal. She exhibits no distension and no mass. There is no tenderness. There is no rebound and no guarding.  Musculoskeletal: She exhibits tenderness. She exhibits no edema.  Lymphadenopathy:    She has no cervical adenopathy.  Neurological: She displays normal reflexes. No cranial nerve deficit. She exhibits normal muscle tone. Coordination normal.  Skin: No rash noted. No erythema.  Psychiatric: She has a normal mood and affect. Her behavior is normal. Judgment and thought content normal.  Soft neck collar is on Restless w/pain   Lab Results  Component Value Date   WBC 7.2 07/12/2015   HGB 13.1 07/12/2015   HCT 38.3 07/12/2015   PLT 344 07/12/2015   GLUCOSE 90 07/12/2015   CHOL 250* 07/08/2014   TRIG 115.0 07/08/2014   HDL 42.50 07/08/2014   LDLDIRECT 207.7 05/02/2011   LDLCALC 185* 07/08/2014   ALT 15 07/12/2015   AST 21 07/12/2015   NA 138 07/12/2015   K 3.9 07/12/2015   CL 104 07/12/2015   CREATININE 1.02* 07/12/2015   BUN 14 07/12/2015   CO2 24 07/12/2015   TSH 4.05 07/08/2014   HGBA1C 5.8 08/29/2014    Dg Skull 1-3 Views  10/21/2015  CLINICAL DATA:  Memory loss.  Evaluate shunt. EXAM: SKULL - 1-3 VIEW COMPARISON:  None. FINDINGS: Visualized shunt catheter in the right neck, entering in the right posterior occipital region. It is difficult to visualize the portion of the shunt overlying the posterior calvarium. Remainder the shunt appears intact. IMPRESSION: Right shunt catheter as above. Electronically Signed   By: Charlett NoseKevin  Dover M.D.   On: 10/21/2015 13:30   Dg Chest 1 View  10/21/2015  CLINICAL DATA:  Assess status of a ventriculoperitoneal shunt placed 40 years ago; patient reports memory loss and intractable headache. EXAM: CHEST 1 VIEW COMPARISON:  PA and lateral chest x-ray of July 12, 2015 FINDINGS: The ventriculoperitoneal shunt tube is visible  extending from the base of the neck to the lower aspect of the right hemi- thorax but it is difficult to follow it more inferiorly until approximately T11 where it becomes visible again. Visualization is hampered by the prominent dextrocurvature of the lower thoracic spine. Without a lateral view it is difficult to comment further on the shunt. The lungs are clear. The heart is top-normal in size. The pulmonary vascularity is normal. There is moderate S shaped thoracolumbar scoliosis. IMPRESSION: The ventriculoperitoneal shunt tube appears intact down to approximately T8 where it is lost due to overlying bony structures. It is again demonstrated at approximately T11. One cannot exclude discontinuity of the lower thoracic portion of the shunt. Electronically Signed   By: David  SwazilandJordan M.D.   On: 10/21/2015 13:31   Dg Abd 1 View  10/21/2015  CLINICAL DATA:  Evaluate shunt. EXAM: ABDOMEN - 1 VIEW COMPARISON:  None. FINDINGS: The shunt tip is in the right upper quadrant. The bowel  gas pattern is unremarkable. The soft tissue shadows are maintained. No worrisome calcifications. The bony structures are intact. IMPRESSION: Shunt tip in the right upper quadrant. Unremarkable abdominal radiograph. Electronically Signed   By: Rudie MeyerP.  Gallerani M.D.   On: 10/21/2015 13:30   Ct Head Wo Contrast  10/21/2015  CLINICAL DATA:  50 year old female with chronic shunt since the 1970s. Recurrent headaches. Initial encounter. EXAM: CT HEAD WITHOUT CONTRAST TECHNIQUE: Contiguous axial images were obtained from the base of the skull through the vertex without intravenous contrast. COMPARISON:  Brain MRI 07/12/2015 and earlier. FINDINGS: Sequelae of right frontotemporal craniotomy. There is an intracranial surgical clip at the central skullbase just above the posterior clinoid. This was seen to be near the basilar artery tip on the recent MRI. There is a E right posterior convexity approach ventriculostomy shunt. No acute osseous  abnormality identified. Visualized paranasal sinuses and mastoids are clear. Negative visualized orbits soft tissues. Chronic scalp soft tissue scarring. Posterior right scalp shunt reservoir with tubing which descends in the posterior right neck soft tissues. Chronic ventriculomegaly appears stable since August. The current shunt catheter courses to the right temporal horn and appears stable. Chronic encephalomalacia overlying the right frontal horn. No CT evidence of transependymal edema or acute cortically based infarct. No acute intracranial hemorrhage identified. No midline shift or acute intracranial mass effect. IMPRESSION: 1. Stable appearance of the brain since the MRI in August. No transependymal edema. 2. Chronic ventriculomegaly and right frontal horn region encephalomalacia. 3. Chronic postoperative changes including prior right frontotemporal craniotomy, and chronic surgical clip at the central skullbase near the basilar artery. Electronically Signed   By: Odessa FlemingH  Hall M.D.   On: 10/21/2015 15:59    Assessment & Plan:   There are no diagnoses linked to this encounter. I am having Samantha [name] maintain her SUMAtriptan, calcium-vitamin D, aspirin, omega-3 acid ethyl esters, atorvastatin, lamoTRIgine, ranitidine, linaclotide, DULoxetine, LORazepam, donepezil, HYDROcodone-acetaminophen, traMADol, and meloxicam.  No orders of the defined types were placed in this encounter.     Follow-up: No Follow-up on file.  Sonda PrimesAlex Plotnikov, MD

## 2016-05-06 NOTE — Telephone Encounter (Signed)
Pt seen today.  See OV note.

## 2016-05-07 LAB — TOXASSURE SELECT,+ANTIDEPR,UR: PDF: 0

## 2016-05-09 ENCOUNTER — Other Ambulatory Visit: Payer: Self-pay | Admitting: Internal Medicine

## 2016-05-10 ENCOUNTER — Ambulatory Visit: Payer: Medicare Other | Admitting: Physical Medicine & Rehabilitation

## 2016-05-10 NOTE — Telephone Encounter (Signed)
[facility] had [facility] on [facility] vm../lmb 

## 2016-05-31 ENCOUNTER — Telehealth: Payer: Self-pay

## 2016-05-31 DIAGNOSIS — Z79891 Long term (current) use of opiate analgesic: Secondary | ICD-10-CM | POA: Diagnosis not present

## 2016-05-31 DIAGNOSIS — M546 Pain in thoracic spine: Secondary | ICD-10-CM | POA: Diagnosis not present

## 2016-05-31 DIAGNOSIS — G8929 Other chronic pain: Secondary | ICD-10-CM | POA: Diagnosis not present

## 2016-05-31 DIAGNOSIS — Z79899 Other long term (current) drug therapy: Secondary | ICD-10-CM | POA: Diagnosis not present

## 2016-05-31 DIAGNOSIS — M542 Cervicalgia: Secondary | ICD-10-CM | POA: Diagnosis not present

## 2016-05-31 MED ORDER — HYDROCODONE-ACETAMINOPHEN 10-325 MG PO TABS: 0.5000 | ORAL_TABLET | Freq: Four times a day (QID) | ORAL | Status: DC | PRN

## 2016-05-31 NOTE — Telephone Encounter (Signed)
Ok - [employer] Keep ROV Thx

## 2016-05-31 NOTE — Telephone Encounter (Signed)
Pt called requesting Rx refill of Hydrocodone.  Pt states she has upcoming appointment with pain management on July 27 but will run out of her medication in 7 days. Please advise

## 2016-05-31 NOTE — Telephone Encounter (Signed)
Pt informed, Rx in cabinet for pt pick up  

## 2016-06-04 DIAGNOSIS — M542 Cervicalgia: Secondary | ICD-10-CM | POA: Diagnosis not present

## 2016-06-04 DIAGNOSIS — M5416 Radiculopathy, lumbar region: Secondary | ICD-10-CM | POA: Diagnosis not present

## 2016-06-15 ENCOUNTER — Telehealth: Payer: Self-pay | Admitting: Emergency Medicine

## 2016-06-15 NOTE — Telephone Encounter (Signed)
Pharmacy called and pt has a lower co pay and needs pts diagnoses and what she has tried and failed for omega-3 acid ethyl esters (LOVAZA) 1 G capsule. Please follow up thanks.

## 2016-06-15 NOTE — Telephone Encounter (Signed)
Form from [facility] listing possible alternatives given to PCP in red folder for review.

## 2016-06-20 ENCOUNTER — Telehealth: Payer: Self-pay | Admitting: Internal Medicine

## 2016-06-20 NOTE — Telephone Encounter (Signed)
Patient called in regardingomega-3 acid ethyl esters (LOVAZA) 1 G capsule [[id number]][[id number]]  . States that Samantha [name] is using [employer] as her insurance

## 2016-06-20 NOTE — Telephone Encounter (Signed)
Pt called in and said that the meds that Dr Macario Goldsplot sent in for pt, the ins compy has denied it.  She wants to know is there something else that needs to be done in order for her to get this med or is there something else that can be called in?  Pt is requesting a call back when you get a chance?

## 2016-06-21 NOTE — Telephone Encounter (Signed)
Ok Thx 

## 2016-06-27 ENCOUNTER — Telehealth: Payer: Self-pay | Admitting: Emergency Medicine

## 2016-06-27 NOTE — Telephone Encounter (Signed)
[facility] and [place] is [place] in [place]. She forgot her meds and wants to know if you can send a 5 day prescription of LORazepam (ATIVAN) 1 MG tablet [facility] [address] [place], [place]. Please advise. Thanks.

## 2016-06-27 NOTE — Telephone Encounter (Signed)
OK. Thx

## 2016-06-28 MED ORDER — LORAZEPAM 1 MG PO TABS: 1.0000 mg | ORAL_TABLET | Freq: Two times a day (BID) | ORAL | 0 refills | Status: DC | PRN

## 2016-06-28 NOTE — Telephone Encounter (Signed)
[facility] to [facility] @ [phone]...[name]/lmb

## 2016-07-05 DIAGNOSIS — Z79899 Other long term (current) drug therapy: Secondary | ICD-10-CM | POA: Diagnosis not present

## 2016-07-05 DIAGNOSIS — G8929 Other chronic pain: Secondary | ICD-10-CM | POA: Diagnosis not present

## 2016-07-05 DIAGNOSIS — M546 Pain in thoracic spine: Secondary | ICD-10-CM | POA: Diagnosis not present

## 2016-07-11 ENCOUNTER — Other Ambulatory Visit: Payer: Self-pay | Admitting: Internal Medicine

## 2016-07-23 ENCOUNTER — Other Ambulatory Visit: Payer: Self-pay | Admitting: Internal Medicine

## 2016-07-28 DIAGNOSIS — M791 Myalgia: Secondary | ICD-10-CM | POA: Diagnosis not present

## 2016-08-03 DIAGNOSIS — G8929 Other chronic pain: Secondary | ICD-10-CM | POA: Diagnosis not present

## 2016-08-03 DIAGNOSIS — M546 Pain in thoracic spine: Secondary | ICD-10-CM | POA: Diagnosis not present

## 2016-08-03 DIAGNOSIS — M791 Myalgia: Secondary | ICD-10-CM | POA: Diagnosis not present

## 2016-08-03 DIAGNOSIS — Z79891 Long term (current) use of opiate analgesic: Secondary | ICD-10-CM | POA: Diagnosis not present

## 2016-08-05 ENCOUNTER — Other Ambulatory Visit: Payer: Self-pay | Admitting: Internal Medicine

## 2016-08-08 ENCOUNTER — Ambulatory Visit: Payer: Medicare Other | Admitting: Internal Medicine

## 2016-08-19 ENCOUNTER — Other Ambulatory Visit: Payer: Self-pay | Admitting: Neurology

## 2016-08-22 ENCOUNTER — Ambulatory Visit (INDEPENDENT_AMBULATORY_CARE_PROVIDER_SITE_OTHER): Payer: Medicare Other | Admitting: Internal Medicine

## 2016-08-22 ENCOUNTER — Encounter: Payer: Self-pay | Admitting: Internal Medicine

## 2016-08-22 VITALS — BP 138/86 | HR 88 | Temp 98.4°F | Ht <= 58 in | Wt 134.0 lb

## 2016-08-22 DIAGNOSIS — M545 Low back pain, unspecified: Secondary | ICD-10-CM

## 2016-08-22 DIAGNOSIS — G4733 Obstructive sleep apnea (adult) (pediatric): Secondary | ICD-10-CM | POA: Diagnosis not present

## 2016-08-22 DIAGNOSIS — R410 Disorientation, unspecified: Secondary | ICD-10-CM | POA: Diagnosis not present

## 2016-08-22 DIAGNOSIS — Z23 Encounter for immunization: Secondary | ICD-10-CM

## 2016-08-22 DIAGNOSIS — M79605 Pain in left leg: Secondary | ICD-10-CM

## 2016-08-22 DIAGNOSIS — F419 Anxiety disorder, unspecified: Secondary | ICD-10-CM

## 2016-08-22 DIAGNOSIS — K5901 Slow transit constipation: Secondary | ICD-10-CM

## 2016-08-22 MED ORDER — DULOXETINE HCL 60 MG PO CPEP: ORAL_CAPSULE | ORAL | 2 refills | Status: DC

## 2016-08-22 MED ORDER — LINACLOTIDE 290 MCG PO CAPS: 290.0000 ug | ORAL_CAPSULE | Freq: Every day | ORAL | 3 refills | Status: DC

## 2016-08-22 MED ORDER — LAMOTRIGINE 100 MG PO TABS: ORAL_TABLET | ORAL | 2 refills | Status: DC

## 2016-08-22 NOTE — Assessment & Plan Note (Signed)
Chronic On Aricept

## 2016-08-22 NOTE — Assessment & Plan Note (Signed)
F/u w/[facility] ([facility])

## 2016-08-22 NOTE — Assessment & Plan Note (Signed)
Increase Linzess dose

## 2016-08-22 NOTE — Progress Notes (Signed)
Pre visit review using our clinic review tool, if applicable. No additional management support is needed unless otherwise documented below in the visit note. 

## 2016-08-22 NOTE — Assessment & Plan Note (Signed)
F/u w/Dr Sood 

## 2016-08-22 NOTE — Progress Notes (Signed)
Subjective:  Patient ID: Samantha [name], female    DOB: 1966/02/10  Age: 50 y.o. MRN: [id number]  CC: Follow-up (3 month followup)   HPI Samantha [name] presents for LBP, chronic pain, insomnia, memory loss f/u  Outpatient Medications Prior to Visit  Medication Sig Dispense Refill  . aspirin 81 MG tablet Take 81 mg by mouth daily.    Marland Kitchen. atorvastatin (LIPITOR) 20 MG tablet TAKE 1 TABLET BY MOUTH DAILY 90 tablet 3  . Calcium Carbonate-Vitamin D (CALCIUM-VITAMIN D) 500-200 MG-UNIT per tablet Take 1 tablet by mouth 2 (two) times daily with a meal.    . donepezil (ARICEPT) 5 MG tablet TAKE 1 TABLET AT BEDTIME(90 DAY SUPPLY) 90 tablet 1  . DULoxetine (CYMBALTA) 60 MG capsule TAKE 1 CAPSULE (60 MG TOTAL) BY MOUTH DAILY. 90 capsule 2  . HYDROcodone-acetaminophen (NORCO) 10-325 MG tablet Take 0.5-1 tablets by mouth every 6 (six) hours as needed for severe pain. 120 tablet 0  . lamoTRIgine (LAMICTAL) 100 MG tablet TAKE 1 TAB BY MOUTH IN THE MORNING, AND THEN TAKE 1 1/2 TAB BY MOUTH AT NIGHT 240 tablet 2  . Linaclotide (LINZESS) 145 MCG CAPS capsule TAKE 1 CAPSULE (145 MCG TOTAL) BY MOUTH DAILY. 90 capsule 3  . LORazepam (ATIVAN) 1 MG tablet Take 1 tablet (1 mg total) by mouth 2 (two) times daily as needed. 10 tablet 0  . meloxicam (MOBIC) 15 MG tablet Take 1 tablet (15 mg total) by mouth daily. 90 tablet 1  . ranitidine (ZANTAC) 150 MG tablet TAKE 1 TABLET BY MOUTH TWICE A DAY X 2 WEEKS,THEN ONCE DAILY 60 tablet 0  . SUMAtriptan (IMITREX) 100 MG tablet Take 1 tablet (100 mg total) by mouth daily as needed. 10 tablet 11  . omega-3 acid ethyl esters (LOVAZA) 1 G capsule TAKE 2 CAPSULES (2 G TOTAL) BY MOUTH 2 (TWO) TIMES DAILY. (Patient not taking: Reported on 08/22/2016) 360 capsule 3   No facility-administered medications prior to visit.     ROS Review of Systems  Constitutional: Negative for activity change, appetite change, chills, fatigue and unexpected weight change.  HENT: Negative for  congestion, mouth sores and sinus pressure.   Eyes: Negative for visual disturbance.  Respiratory: Negative for cough and chest tightness.   Gastrointestinal: Negative for abdominal pain and nausea.  Genitourinary: Negative for difficulty urinating, frequency and vaginal pain.  Musculoskeletal: Positive for arthralgias, back pain and gait problem.  Skin: Negative for pallor and rash.  Neurological: Positive for headaches. Negative for dizziness, tremors, weakness and numbness.  Psychiatric/Behavioral: Negative for confusion and sleep disturbance. The patient is nervous/anxious.     Objective:  BP 138/86 (BP Location: Left Arm, Patient Position: Sitting, Cuff Size: Normal)   Pulse 88   Temp 98.4 F (36.9 C)   Ht 4\' 10"  (1.473 m)   Wt 134 lb (60.8 kg)   SpO2 98%   BMI 28.01 kg/m   BP Readings from Last 3 Encounters:  08/22/16 138/86  05/06/16 130/80  04/28/16 (!) 136/93    Wt Readings from Last 3 Encounters:  08/22/16 134 lb (60.8 kg)  05/06/16 132 lb (59.9 kg)  12/15/15 135 lb (61.2 kg)    Physical Exam  Constitutional: She appears well-developed. No distress.  HENT:  Head: Normocephalic.  Right Ear: External ear normal.  Left Ear: External ear normal.  Nose: Nose normal.  Mouth/Throat: Oropharynx is clear and moist.  Eyes: Conjunctivae are normal. Pupils are equal, round, and reactive to light. Right  eye exhibits no discharge. Left eye exhibits no discharge.  Neck: Normal range of motion. Neck supple. No JVD present. No tracheal deviation present. No thyromegaly present.  Cardiovascular: Normal rate, regular rhythm and normal heart sounds.   Pulmonary/Chest: No stridor. No respiratory distress. She has no wheezes.  Abdominal: Soft. Bowel sounds are normal. She exhibits no distension and no mass. There is no tenderness. There is no rebound and no guarding.  Musculoskeletal: She exhibits no edema or tenderness.  Lymphadenopathy:    She has no cervical adenopathy.    Neurological: She displays normal reflexes. No cranial nerve deficit. She exhibits normal muscle tone. Coordination normal.  Skin: No rash noted. No erythema.  Psychiatric: She has a normal mood and affect. Her behavior is normal. Judgment and thought content normal.  LS tender Cane  Lab Results  Component Value Date   WBC 7.2 07/12/2015   HGB 13.1 07/12/2015   HCT 38.3 07/12/2015   PLT 344 07/12/2015   GLUCOSE 90 07/12/2015   CHOL 250 (H) 07/08/2014   TRIG 115.0 07/08/2014   HDL 42.50 07/08/2014   LDLDIRECT 207.7 05/02/2011   LDLCALC 185 (H) 07/08/2014   ALT 15 07/12/2015   AST 21 07/12/2015   NA 138 07/12/2015   K 3.9 07/12/2015   CL 104 07/12/2015   CREATININE 1.02 (H) 07/12/2015   BUN 14 07/12/2015   CO2 24 07/12/2015   TSH 4.05 07/08/2014   HGBA1C 5.8 08/29/2014    Dg Skull 1-3 Views  Result Date: 10/21/2015 CLINICAL DATA:  Memory loss.  Evaluate shunt. EXAM: SKULL - 1-3 VIEW COMPARISON:  None. FINDINGS: Visualized shunt catheter in the right neck, entering in the right posterior occipital region. It is difficult to visualize the portion of the shunt overlying the posterior calvarium. Remainder the shunt appears intact. IMPRESSION: Right shunt catheter as above. Electronically Signed   By: Charlett Nose M.D.   On: 10/21/2015 13:30   Dg Chest 1 View  Result Date: 10/21/2015 CLINICAL DATA:  Assess status of a ventriculoperitoneal shunt placed 40 years ago; patient reports memory loss and intractable headache. EXAM: CHEST 1 VIEW COMPARISON:  PA and lateral chest x-ray of July 12, 2015 FINDINGS: The ventriculoperitoneal shunt tube is visible extending from the base of the neck to the lower aspect of the right hemi- thorax but it is difficult to follow it more inferiorly until approximately T11 where it becomes visible again. Visualization is hampered by the prominent dextrocurvature of the lower thoracic spine. Without a lateral view it is difficult to comment further on the  shunt. The lungs are clear. The heart is top-normal in size. The pulmonary vascularity is normal. There is moderate S shaped thoracolumbar scoliosis. IMPRESSION: The ventriculoperitoneal shunt tube appears intact down to approximately T8 where it is lost due to overlying bony structures. It is again demonstrated at approximately T11. One cannot exclude discontinuity of the lower thoracic portion of the shunt. Electronically Signed   By: David  Swaziland M.D.   On: 10/21/2015 13:31   Dg Abd 1 View  Result Date: 10/21/2015 CLINICAL DATA:  Evaluate shunt. EXAM: ABDOMEN - 1 VIEW COMPARISON:  None. FINDINGS: The shunt tip is in the right upper quadrant. The bowel gas pattern is unremarkable. The soft tissue shadows are maintained. No worrisome calcifications. The bony structures are intact. IMPRESSION: Shunt tip in the right upper quadrant. Unremarkable abdominal radiograph. Electronically Signed   By: Rudie Meyer M.D.   On: 10/21/2015 13:30   Ct Head Wo  Contrast  Result Date: 10/21/2015 CLINICAL DATA:  50 year old female with chronic shunt since the 1970s. Recurrent headaches. Initial encounter. EXAM: CT HEAD WITHOUT CONTRAST TECHNIQUE: Contiguous axial images were obtained from the base of the skull through the vertex without intravenous contrast. COMPARISON:  Brain MRI 07/12/2015 and earlier. FINDINGS: Sequelae of right frontotemporal craniotomy. There is an intracranial surgical clip at the central skullbase just above the posterior clinoid. This was seen to be near the basilar artery tip on the recent MRI. There is a E right posterior convexity approach ventriculostomy shunt. No acute osseous abnormality identified. Visualized paranasal sinuses and mastoids are clear. Negative visualized orbits soft tissues. Chronic scalp soft tissue scarring. Posterior right scalp shunt reservoir with tubing which descends in the posterior right neck soft tissues. Chronic ventriculomegaly appears stable since August. The  current shunt catheter courses to the right temporal horn and appears stable. Chronic encephalomalacia overlying the right frontal horn. No CT evidence of transependymal edema or acute cortically based infarct. No acute intracranial hemorrhage identified. No midline shift or acute intracranial mass effect. IMPRESSION: 1. Stable appearance of the brain since the MRI in August. No transependymal edema. 2. Chronic ventriculomegaly and right frontal horn region encephalomalacia. 3. Chronic postoperative changes including prior right frontotemporal craniotomy, and chronic surgical clip at the central skullbase near the basilar artery. Electronically Signed   By: Odessa Fleming M.D.   On: 10/21/2015 15:59    Assessment & Plan:   Linlee was seen today for follow-up.  Diagnoses and all orders for this visit:  Need for prophylactic vaccination and inoculation against influenza -     Flu Vaccine QUAD 36+ mos IM   I am having Ms. Bracco maintain her SUMAtriptan, calcium-vitamin D, aspirin, omega-3 acid ethyl esters, ranitidine, linaclotide, DULoxetine, meloxicam, HYDROcodone-acetaminophen, LORazepam, donepezil, atorvastatin, lamoTRIgine, and baclofen.  Meds ordered this encounter  Medications  . baclofen (LIORESAL) 10 MG tablet     Follow-up: No Follow-up on file.  Sonda Primes, MD

## 2016-08-22 NOTE — Assessment & Plan Note (Signed)
Chronic  Potential benefits of a long term benzodiazepines  use as well as potential risks  and complications were explained to the patient and were aknowledged. Cymbalta, Lexapro Lorazepam

## 2016-08-25 ENCOUNTER — Ambulatory Visit (INDEPENDENT_AMBULATORY_CARE_PROVIDER_SITE_OTHER): Payer: Medicare Other | Admitting: Internal Medicine

## 2016-08-25 ENCOUNTER — Telehealth: Payer: Self-pay | Admitting: Physical Medicine & Rehabilitation

## 2016-08-25 ENCOUNTER — Encounter: Payer: Self-pay | Admitting: Internal Medicine

## 2016-08-25 VITALS — BP 120/72 | HR 68 | Ht <= 58 in | Wt 133.8 lb

## 2016-08-25 DIAGNOSIS — G4733 Obstructive sleep apnea (adult) (pediatric): Secondary | ICD-10-CM

## 2016-08-25 NOTE — Patient Instructions (Addendum)
Order- new DME, new CPAP auto 5-15, mask of choice, humidifier, supplies, AirView    Dx OSA  Please call as needed  We will help you get necessary CPAP follow-up here in 30-90 days after you get you CPAP

## 2016-08-25 NOTE — Telephone Encounter (Signed)
Pharmacy is requesting a refill on Meloxicam for patient.  Please call [phone].

## 2016-08-25 NOTE — Progress Notes (Signed)
08/25/2016-50 year old female never smoker followed by Dr. Craige CottaSood for OSA, complicated by history of migraine, hydrocephalus/VP shunt and concern for nocturnal seizures, Hx CVA for which she is followed by Neurology NPSG 03/08/[facility] at [facility] with full seizure montage- AHI 12.1/ hr, desaturation to 91%, no seizure activity CPAP auto 5-15/ [facility]- apparently never got started. Last here 2016. VS patient; Pt needs CPAP machine and never set up by VS. Pt has stroke and hard time remembering things.  Here with a friend. Has now decided she wants to start CPAP She used CPAP remotely when she worked for [employer] and was given a machine  Uses melatonin to help sleep onset while taking Vicodin and baclofen at night for back pain. Denies shortness of breath, nasal symptoms, cough or wheezing.  ROS-see HPI   Negative unless "+" Constitutional:    weight loss, night sweats, fevers, chills, +fatigue, lassitude. HEENT:    headaches, difficulty swallowing, tooth/dental problems, sore throat,       sneezing, itching, ear ache, nasal congestion, post nasal drip, snoring CV:    chest pain, orthopnea, PND, swelling in lower extremities, anasarca,                                                         dizziness, palpitations Resp:   shortness of breath with exertion or at rest.                productive cough,   non-productive cough, coughing up of blood.              change in color of mucus.  wheezing.   Skin:    rash or lesions. GI:  No-   heartburn, indigestion, abdominal pain, nausea, vomiting, diarrhea,                 change in bowel habits, loss of appetite GU: dysuria, change in color of urine, no urgency or frequency.   flank pain. MS:   joint pain, stiffness, decreased range of motion, back pain. Neuro-     nothing unusual Psych:  change in mood or affect.  depression or anxiety.   memory loss.  OBJ- Physical Exam General- Alert, Oriented, Affect-appropriate, Distress- none acute Skin-  rash-none, lesions- none, excoriation- none Lymphadenopathy- none Head- atraumatic, + R VP shunt            Eyes- Gross vision intact, PERRLA, conjunctivae and secretions clear            Ears- Hearing, canals-normal            Nose- Clear, no-Septal dev, mucus, polyps, erosion, perforation             Throat- Mallampati III-IV , mucosa clear , drainage- none, tonsils- atrophic Neck- flexible , trachea midline, no stridor , thyroid nl, carotid no bruit Chest - symmetrical excursion , unlabored           Heart/CV- RRR , no murmur , no gallop  , no rub, nl s1 s2                           - JVD- none , edema- none, stasis changes- none, varices- none           Lung- clear to P&A, wheeze- none, cough-  none , dullness-none, rub- none           Chest wall-  Abd-  Br/ Gen/ Rectal- Not done, not indicated Extrem- cyanosis- none, clubbing, none, atrophy- none, strength- nl Neuro- grossly intact to observation

## 2016-08-26 MED ORDER — MELOXICAM 15 MG PO TABS
15.0000 mg | ORAL_TABLET | Freq: Every day | ORAL | 0 refills | Status: DC
Start: 2016-08-26 — End: 2016-12-26

## 2016-08-26 NOTE — Telephone Encounter (Signed)
Last visit 04/28/16  And does not have follow up.  Please advise about the refill

## 2016-08-26 NOTE — Telephone Encounter (Signed)
Spoke to Dr. Wynn BankerKirsteins, 1 more refill advised, future refills should come from PCP. Contacted pt, patient acknowledged and understood

## 2016-08-26 NOTE — Progress Notes (Signed)
Urine drug screen for this encounter is consistent for prescribed medication 

## 2016-08-26 NOTE — Addendum Note (Signed)
Addended by: Angela NevinWESSLING, Miaisabella Bacorn D on: 08/26/2016 01:10 PM   Modules accepted: Orders

## 2016-08-28 NOTE — Assessment & Plan Note (Signed)
Based on most recent sleep study she does qualify to start CPAP. We discussed oral appliances as an alternative. She chooses CPAP. We discussed goals, comfort issues and compliance. Plan- CPAP auto titration 5-15. She will return to Dr. Adria DevonSouthard for follow-up.

## 2016-08-30 ENCOUNTER — Telehealth: Payer: Self-pay | Admitting: Internal Medicine

## 2016-08-30 DIAGNOSIS — G4733 Obstructive sleep apnea (adult) (pediatric): Secondary | ICD-10-CM

## 2016-08-30 NOTE — Telephone Encounter (Signed)
[facility] to see what [facility] is needed [facility].  Will update the chart once Melissa answers the message.

## 2016-09-01 DIAGNOSIS — M546 Pain in thoracic spine: Secondary | ICD-10-CM | POA: Diagnosis not present

## 2016-09-01 DIAGNOSIS — M791 Myalgia: Secondary | ICD-10-CM | POA: Diagnosis not present

## 2016-09-01 DIAGNOSIS — Z79891 Long term (current) use of opiate analgesic: Secondary | ICD-10-CM | POA: Diagnosis not present

## 2016-09-01 DIAGNOSIS — G8929 Other chronic pain: Secondary | ICD-10-CM | POA: Diagnosis not present

## 2016-09-02 NOTE — Telephone Encounter (Signed)
Message sent to [facility] to find out what is needed.  Will update chart once I hear back from her.

## 2016-09-02 NOTE — Telephone Encounter (Signed)
Sheron NightingaleJason Pierce  Marcellus Scottonnie L Adkins, CMA        I was told that [facility] sent a message about this to Florentina AddisonKatie on Monday. I have added the note below to show what's going on. Thanks!   rcvd referral for the pt to be set up with a cpap. This was a previous order that never happened. Pt was insured by [employer] when the inital order was placed. The pt since then went Medicare on 03-14-2016 and the SS was performed before this so the pt wpuld have to start over with a new [id number] and ss.    Will forward to [facility] to follow up on message to pt on Monday.

## 2016-09-05 ENCOUNTER — Other Ambulatory Visit: Payer: Self-pay | Admitting: Obstetrics and Gynecology

## 2016-09-05 DIAGNOSIS — Z124 Encounter for screening for malignant neoplasm of cervix: Secondary | ICD-10-CM | POA: Diagnosis not present

## 2016-09-05 NOTE — Telephone Encounter (Signed)
Spoke with pt and advised that new sleep study is needed.  Pt verbalized understanding and is ok with doing a HST.  Order placed.

## 2016-09-05 NOTE — Telephone Encounter (Signed)
Patient returning call - she can be reached at [phone]-pr

## 2016-09-05 NOTE — Telephone Encounter (Signed)
LMTCB for patient-we need to let her know that her insurance requires new sleep study; CY would like to have patient do a HST(will need to place order once patient is aware).

## 2016-09-07 LAB — CYTOLOGY - PAP

## 2016-09-15 ENCOUNTER — Other Ambulatory Visit: Payer: Self-pay | Admitting: Internal Medicine

## 2016-09-15 DIAGNOSIS — G4733 Obstructive sleep apnea (adult) (pediatric): Secondary | ICD-10-CM

## 2016-09-27 DIAGNOSIS — M791 Myalgia: Secondary | ICD-10-CM | POA: Diagnosis not present

## 2016-09-27 DIAGNOSIS — Z79891 Long term (current) use of opiate analgesic: Secondary | ICD-10-CM | POA: Diagnosis not present

## 2016-09-27 DIAGNOSIS — G8929 Other chronic pain: Secondary | ICD-10-CM | POA: Diagnosis not present

## 2016-09-27 DIAGNOSIS — M546 Pain in thoracic spine: Secondary | ICD-10-CM | POA: Diagnosis not present

## 2016-10-14 DIAGNOSIS — Z1231 Encounter for screening mammogram for malignant neoplasm of breast: Secondary | ICD-10-CM | POA: Diagnosis not present

## 2016-10-25 DIAGNOSIS — M546 Pain in thoracic spine: Secondary | ICD-10-CM | POA: Diagnosis not present

## 2016-10-25 DIAGNOSIS — M791 Myalgia: Secondary | ICD-10-CM | POA: Diagnosis not present

## 2016-10-25 DIAGNOSIS — G8929 Other chronic pain: Secondary | ICD-10-CM | POA: Diagnosis not present

## 2016-10-25 DIAGNOSIS — Z79891 Long term (current) use of opiate analgesic: Secondary | ICD-10-CM | POA: Diagnosis not present

## 2016-10-27 ENCOUNTER — Ambulatory Visit (HOSPITAL_BASED_OUTPATIENT_CLINIC_OR_DEPARTMENT_OTHER): Payer: Medicare Other | Attending: Internal Medicine | Admitting: Internal Medicine

## 2016-10-27 VITALS — Ht <= 58 in | Wt 130.0 lb

## 2016-10-27 DIAGNOSIS — G4733 Obstructive sleep apnea (adult) (pediatric): Secondary | ICD-10-CM | POA: Diagnosis not present

## 2016-11-02 DIAGNOSIS — G4733 Obstructive sleep apnea (adult) (pediatric): Secondary | ICD-10-CM | POA: Diagnosis not present

## 2016-11-02 NOTE — Procedures (Signed)
  Patient Name: Samantha [name], Telly Study Date: 10/27/2016 Gender: Female D.O.B: Feb 27, 1966 Age (years): [age] Referring Provider: Jetty Duhamellinton Roseann Kees MD, ABSM Height (inches): 59 Interpreting Physician: Jetty Duhamellinton Nekeya Briski MD, ABSM Weight (lbs): 130 RPSGT: Cherylann ParrDubili, Fred BMI: 26 MRN: [id number] Neck Size: 15.00 CLINICAL INFORMATION Sleep Study Type: NPSG  Indication for sleep study: Fatigue, Snoring  Epworth Sleepiness Score: 16  SLEEP STUDY TECHNIQUE As per the AASM Manual for the Scoring of Sleep and Associated Events v2.3 (April 2016) with a hypopnea requiring 4% desaturations.  The channels recorded and monitored were frontal, central and occipital EEG, electrooculogram (EOG), submentalis EMG (chin), nasal and oral airflow, thoracic and abdominal wall motion, anterior tibialis EMG, snore microphone, electrocardiogram, and pulse oximetry.  MEDICATIONS Medications self-administered by patient taken the night of the study : ASPIRIN, LAMICTAL, ZANTAC, STOOL SOFTNER, ARICEPT, CYMBALTA, MELATONIN, MELOXICAM, OXYCODONE HCL, BACLOFEN  SLEEP ARCHITECTURE The study was initiated at 11:04:29 PM and ended at 5:38:19 AM.  Sleep onset time was 32.8 minutes and the sleep efficiency was 85.3%. The total sleep time was 336.0 minutes.  Stage REM latency was 143.5 minutes.  The patient spent 1.64% of the night in stage N1 sleep, 19.79% in stage N2 sleep, 77.23% in stage N3 and 1.34% in REM.  Alpha intrusion was absent.  Supine sleep was 45.39%.  RESPIRATORY PARAMETERS The overall apnea/hypopnea index (AHI) was 13.9 per hour. There were 23 total apneas, including 21 obstructive, 2 central and 0 mixed apneas. There were 55 hypopneas and 1 RERAs.  The AHI during Stage REM sleep was 0.0 per hour.  AHI while supine was 26.4 per hour.  The mean oxygen saturation was 94.98%. The minimum SpO2 during sleep was 83.00%.  Moderate snoring was noted during this study.  CARDIAC DATA The 2 lead EKG demonstrated  sinus rhythm. The mean heart rate was 64.63 beats per minute. Other EKG findings include: None. LEG MOVEMENT DATA The total PLMS were 0 with a resulting PLMS index of 0.00. Associated arousal with leg movement index was 0.0 .  IMPRESSIONS - Mild obstructive sleep apnea occurred during this study (AHI = 13.9/h). - Insufficient early events to meet protocol requirements for split CPAP titration. - No significant central sleep apnea occurred during this study (CAI = 0.4/h). - Mild oxygen desaturation was noted during this study (Min O2 = 83.00%). - The patient snored with Moderate snoring volume. - No cardiac abnormalities were noted during this study. - Clinically significant periodic limb movements did not occur during sleep. No significant associated arousals.  DIAGNOSIS - Obstructive Sleep Apnea (327.23 [G47.33 ICD-10])  RECOMMENDATIONS - Therapeutic CPAP titration to determine optimal pressure required to alleviate sleep disordered breathing. - A fitted oral appliance could be an alternmative. - Positional therapy avoiding supine position during sleep. - Avoid alcohol, sedatives and other CNS depressants that may worsen sleep apnea and disrupt normal sleep architecture. - Sleep hygiene should be reviewed to assess factors that may improve sleep quality. - Weight management and regular exercise should be initiated or continued if appropriate.  [Electronically signed] 11/02/2016 01:10 PM  Jetty Duhamellinton Josselyn Harkins MD, ABSM Diplomate, American Board of Sleep Medicine   NPI: [id number]  Waymon BudgeYOUNG,Fitzhugh Vizcarrondo D Diplomate, American Board of Sleep Medicine  ELECTRONICALLY SIGNED ON:  11/02/2016, 1:07 PM [facility] PH: [phone]   FX: [phone] ACCREDITED BY THE AMERICAN ACADEMY OF SLEEP MEDICINE

## 2016-11-10 ENCOUNTER — Telehealth: Payer: Self-pay | Admitting: Pulmonary Disease

## 2016-11-10 DIAGNOSIS — G4733 Obstructive sleep apnea (adult) (pediatric): Secondary | ICD-10-CM

## 2016-11-10 NOTE — Telephone Encounter (Signed)
Pt was last seen by CY on 08/25/16 Please advise of recent sleep study results. Thanks  Allergies  Allergen Reactions  . Citalopram Hydrobromide     REACTION: low libido

## 2016-11-10 NOTE — Telephone Encounter (Signed)
VS  Please Advise-  This pt. Called in wanting to know her sleep study results

## 2016-11-10 NOTE — Telephone Encounter (Signed)
Patient was seen by Dr. Maple HudsonYoung most recently and sleep study reviewed by Dr. Maple HudsonYoung.  Will route message to Dr. Maple HudsonYoung.

## 2016-11-11 NOTE — Telephone Encounter (Signed)
She has mild obstructive sleep apnea based on the sleep study, scoring 13.9 apneas/ hour.  This would qualify her for the CPAP machine ordered in my most recent office note. We can go ahead and order the CPAP machine and settings outlined in my most recent office note.

## 2016-11-11 NOTE — Telephone Encounter (Signed)
Spoke with the pt and notified of recs per CDY Pt verbalized understanding  [facility]

## 2016-11-23 ENCOUNTER — Telehealth: Payer: Self-pay | Admitting: *Deleted

## 2016-11-23 MED ORDER — LAMOTRIGINE 100 MG PO TABS: ORAL_TABLET | ORAL | 0 refills | Status: DC

## 2016-11-23 MED ORDER — LINACLOTIDE 290 MCG PO CAPS: 290.0000 ug | ORAL_CAPSULE | Freq: Every day | ORAL | 1 refills | Status: DC

## 2016-11-23 NOTE — Telephone Encounter (Signed)
Rec'd call pt states she has change pharmacy to [facility], and she is needing refills on her Lamictal & Linzess. Verified chart inform will send rx's electronically...[name]/LMB

## 2016-11-25 ENCOUNTER — Ambulatory Visit: Payer: Medicare Other | Admitting: Internal Medicine

## 2016-12-14 ENCOUNTER — Ambulatory Visit: Payer: Medicare Other | Admitting: Internal Medicine

## 2016-12-23 ENCOUNTER — Ambulatory Visit: Payer: Medicare Other | Admitting: Internal Medicine

## 2016-12-26 ENCOUNTER — Telehealth: Payer: Self-pay | Admitting: Internal Medicine

## 2016-12-26 ENCOUNTER — Ambulatory Visit (INDEPENDENT_AMBULATORY_CARE_PROVIDER_SITE_OTHER): Payer: Medicare Other | Admitting: Internal Medicine

## 2016-12-26 ENCOUNTER — Encounter: Payer: Self-pay | Admitting: Internal Medicine

## 2016-12-26 ENCOUNTER — Other Ambulatory Visit (INDEPENDENT_AMBULATORY_CARE_PROVIDER_SITE_OTHER): Payer: Medicare Other

## 2016-12-26 VITALS — BP 136/86 | HR 78 | Temp 99.2°F | Resp 16 | Ht <= 58 in | Wt 133.0 lb

## 2016-12-26 DIAGNOSIS — F4323 Adjustment disorder with mixed anxiety and depressed mood: Secondary | ICD-10-CM

## 2016-12-26 DIAGNOSIS — R5382 Chronic fatigue, unspecified: Secondary | ICD-10-CM | POA: Diagnosis not present

## 2016-12-26 DIAGNOSIS — E785 Hyperlipidemia, unspecified: Secondary | ICD-10-CM | POA: Diagnosis not present

## 2016-12-26 DIAGNOSIS — R413 Other amnesia: Secondary | ICD-10-CM | POA: Diagnosis not present

## 2016-12-26 DIAGNOSIS — M545 Low back pain: Secondary | ICD-10-CM

## 2016-12-26 DIAGNOSIS — Z9989 Dependence on other enabling machines and devices: Secondary | ICD-10-CM | POA: Diagnosis not present

## 2016-12-26 DIAGNOSIS — Z23 Encounter for immunization: Secondary | ICD-10-CM | POA: Diagnosis not present

## 2016-12-26 DIAGNOSIS — M79605 Pain in left leg: Secondary | ICD-10-CM

## 2016-12-26 DIAGNOSIS — G4733 Obstructive sleep apnea (adult) (pediatric): Secondary | ICD-10-CM

## 2016-12-26 DIAGNOSIS — E559 Vitamin D deficiency, unspecified: Secondary | ICD-10-CM

## 2016-12-26 LAB — URINALYSIS
Bilirubin Urine: NEGATIVE
Hgb urine dipstick: NEGATIVE
Ketones, ur: NEGATIVE
Leukocytes, UA: NEGATIVE
Nitrite: NEGATIVE
Specific Gravity, Urine: 1.02 (ref 1.000–1.030)
Total Protein, Urine: NEGATIVE
Urine Glucose: NEGATIVE
Urobilinogen, UA: 0.2 (ref 0.0–1.0)
pH: 6 (ref 5.0–8.0)

## 2016-12-26 LAB — BASIC METABOLIC PANEL
BUN: 12 mg/dL (ref 6–23)
CO2: 25 mEq/L (ref 19–32)
Calcium: 9.7 mg/dL (ref 8.4–10.5)
Chloride: 104 mEq/L (ref 96–112)
Creatinine, Ser: 0.9 mg/dL (ref 0.40–1.20)
GFR: 69.69 mL/min (ref >60.00)
Glucose, Bld: 102 mg/dL — ABNORMAL HIGH (ref 70–99)
Potassium: 4.3 mEq/L (ref 3.5–5.1)
Sodium: 136 mEq/L (ref 135–145)

## 2016-12-26 LAB — LIPID PANEL
Cholesterol: 205 mg/dL — ABNORMAL HIGH (ref 0–200)
HDL: 52.4 mg/dL (ref >39.00)
LDL Cholesterol: 133 mg/dL — ABNORMAL HIGH (ref 0–99)
NonHDL: 152.92
Total CHOL/HDL Ratio: 4
Triglycerides: 102 mg/dL (ref 0.0–149.0)
VLDL: 20.4 mg/dL (ref 0.0–40.0)

## 2016-12-26 LAB — CBC WITH DIFFERENTIAL/PLATELET
Basophils Absolute: 0.1 10*3/uL (ref 0.0–0.1)
Basophils Relative: 1 % (ref 0.0–3.0)
Eosinophils Absolute: 0.1 10*3/uL (ref 0.0–0.7)
Eosinophils Relative: 1.4 % (ref 0.0–5.0)
HCT: 37 % (ref 36.0–46.0)
Hemoglobin: 12.1 g/dL (ref 12.0–15.0)
Lymphocytes Relative: 33.2 % (ref 12.0–46.0)
Lymphs Abs: 2.7 10*3/uL (ref 0.7–4.0)
MCHC: 32.8 g/dL (ref 30.0–36.0)
MCV: 86.9 fl (ref 78.0–100.0)
Monocytes Absolute: 0.8 10*3/uL (ref 0.1–1.0)
Monocytes Relative: 10 % (ref 3.0–12.0)
Neutro Abs: 4.4 10*3/uL (ref 1.4–7.7)
Neutrophils Relative %: 54.4 % (ref 43.0–77.0)
Platelets: 564 10*3/uL — ABNORMAL HIGH (ref 150.0–400.0)
RBC: 4.26 Mil/uL (ref 3.87–5.11)
RDW: 15.9 % — ABNORMAL HIGH (ref 11.5–15.5)
WBC: 8.2 10*3/uL (ref 4.0–10.5)

## 2016-12-26 LAB — HEPATIC FUNCTION PANEL
ALT: 13 U/L (ref 0–35)
AST: 17 U/L (ref 0–37)
Albumin: 4.6 g/dL (ref 3.5–5.2)
Alkaline Phosphatase: 54 U/L (ref 39–117)
Bilirubin, Direct: 0.1 mg/dL (ref 0.0–0.3)
Total Bilirubin: 0.4 mg/dL (ref 0.2–1.2)
Total Protein: 7.7 g/dL (ref 6.0–8.3)

## 2016-12-26 LAB — TSH: TSH: 2.66 u[IU]/mL (ref 0.35–4.50)

## 2016-12-26 LAB — VITAMIN B12: Vitamin B-12: 469 pg/mL (ref 211–911)

## 2016-12-26 MED ORDER — MELOXICAM 15 MG PO TABS: 7.5000 mg | ORAL_TABLET | Freq: Every day | ORAL | 0 refills | Status: DC | PRN

## 2016-12-26 NOTE — Assessment & Plan Note (Signed)
Not tolerating CPAP that well

## 2016-12-26 NOTE — Telephone Encounter (Signed)
Spoke with pt, who states many years ago she had a nasal mask that only went over her nose. Pt is [facility] that [facility], as the nasal pillows are not working.  CY please advise. Thanks.

## 2016-12-26 NOTE — Assessment & Plan Note (Signed)
On CPAP. ?

## 2016-12-26 NOTE — Addendum Note (Signed)
Addended by: Osa CraverGUIJOZA, Kadija Cruzen M on: 12/26/2016 04:34 PM   Modules accepted: Orders

## 2016-12-26 NOTE — Assessment & Plan Note (Signed)
Stable - not better

## 2016-12-26 NOTE — Telephone Encounter (Signed)
Spoke with pt. She is aware of CY's response. Order has been placed. Nothing further was needed. 

## 2016-12-26 NOTE — Telephone Encounter (Signed)
Ok [facility]- Patient is requesting to change CPAP mask- please help change for comfort and compliance. Mask of choice.    Dx OSA

## 2016-12-26 NOTE — Assessment & Plan Note (Addendum)
Per Dr Manson PasseyBrown ([place] in [place]) - pain is related to LS spine 2017 - Pain Clinic: Norco and Baclofen

## 2016-12-26 NOTE — Progress Notes (Signed)
Pre-visit discussion using our clinic review tool. No additional management support is needed unless otherwise documented below in the visit note.  

## 2016-12-26 NOTE — Assessment & Plan Note (Signed)
Cymbalta 

## 2016-12-26 NOTE — Progress Notes (Signed)
Subjective:  Patient ID: Samantha [name], female    DOB: 14-May-1966  Age: 51 y.o. MRN: [id number]  CC: Follow-up (anxiety, Low back pain, stroke, constipation)   HPI Samantha [name] presents for OSA, chronic pain due to CVA, depression f/u  Outpatient Medications Prior to Visit  Medication Sig Dispense Refill  . aspirin 81 MG tablet Take 81 mg by mouth daily.    Marland Kitchen atorvastatin (LIPITOR) 20 MG tablet TAKE 1 TABLET BY MOUTH DAILY 90 tablet 3  . baclofen (LIORESAL) 10 MG tablet     . Calcium Carbonate-Vitamin D (CALCIUM-VITAMIN D) 500-200 MG-UNIT per tablet Take 1 tablet by mouth 2 (two) times daily with a meal.    . donepezil (ARICEPT) 5 MG tablet TAKE 1 TABLET AT BEDTIME(90 DAY SUPPLY) 90 tablet 1  . DULoxetine (CYMBALTA) 60 MG capsule TAKE 1 CAPSULE (60 MG TOTAL) BY MOUTH DAILY. 90 capsule 2  . HYDROcodone-acetaminophen (NORCO) 10-325 MG tablet Take 0.5-1 tablets by mouth every 6 (six) hours as needed for severe pain. 120 tablet 0  . lamoTRIgine (LAMICTAL) 100 MG tablet TAKE 1 TAB BY MOUTH IN THE MORNING, AND THEN TAKE 1 1/2 TAB BY MOUTH AT NIGHT 240 tablet 0  . linaclotide (LINZESS) 290 MCG CAPS capsule Take 1 capsule (290 mcg total) by mouth daily. 90 capsule 1  . LORazepam (ATIVAN) 1 MG tablet Take 1 tablet (1 mg total) by mouth 2 (two) times daily as needed. 10 tablet 0  . ranitidine (ZANTAC) 150 MG tablet TAKE 1 TABLET BY MOUTH TWICE A DAY X 2 WEEKS,THEN ONCE DAILY 60 tablet 0  . SUMAtriptan (IMITREX) 100 MG tablet Take 1 tablet (100 mg total) by mouth daily as needed. 10 tablet 11  . meloxicam (MOBIC) 15 MG tablet Take 1 tablet (15 mg total) by mouth daily. (Patient not taking: Reported on 12/26/2016) 90 tablet 0  . omega-3 acid ethyl esters (LOVAZA) 1 G capsule TAKE 2 CAPSULES (2 G TOTAL) BY MOUTH 2 (TWO) TIMES DAILY. (Patient not taking: Reported on 12/26/2016) 360 capsule 3   No facility-administered medications prior to visit.     ROS Review of Systems  Constitutional:  Positive for fatigue. Negative for activity change, appetite change, chills and unexpected weight change.  HENT: Negative for congestion, mouth sores and sinus pressure.   Eyes: Negative for visual disturbance.  Respiratory: Negative for cough and chest tightness.   Gastrointestinal: Positive for constipation. Negative for abdominal pain and nausea.  Genitourinary: Negative for difficulty urinating, frequency and vaginal pain.  Musculoskeletal: Positive for back pain and gait problem.  Skin: Negative for pallor and rash.  Neurological: Negative for dizziness, tremors, weakness, numbness and headaches.  Psychiatric/Behavioral: Positive for decreased concentration. Negative for confusion, sleep disturbance and suicidal ideas. The patient is nervous/anxious.     Objective:  BP 136/86   Pulse 78   Temp 99.2 F (37.3 C) (Oral)   Resp 16   Ht 4\' 9"  (1.448 m)   Wt 133 lb (60.3 kg)   LMP 11/24/2016   SpO2 98%   BMI 28.78 kg/m   BP Readings from Last 3 Encounters:  12/26/16 136/86  08/25/16 120/72  08/22/16 138/86    Wt Readings from Last 3 Encounters:  12/26/16 133 lb (60.3 kg)  10/28/16 130 lb (59 kg)  08/25/16 133 lb 12.8 oz (60.7 kg)    Physical Exam  Constitutional: She appears well-developed. No distress.  HENT:  Head: Normocephalic.  Right Ear: External ear normal.  Left Ear:  External ear normal.  Nose: Nose normal.  Mouth/Throat: Oropharynx is clear and moist.  Eyes: Conjunctivae are normal. Pupils are equal, round, and reactive to light. Right eye exhibits no discharge. Left eye exhibits no discharge.  Neck: Normal range of motion. Neck supple. No JVD present. No tracheal deviation present. No thyromegaly present.  Cardiovascular: Normal rate, regular rhythm and normal heart sounds.   Pulmonary/Chest: No stridor. No respiratory distress. She has no wheezes.  Abdominal: Soft. Bowel sounds are normal. She exhibits no distension and no mass. There is no tenderness. There  is no rebound and no guarding.  Musculoskeletal: She exhibits tenderness. She exhibits no edema.  Lymphadenopathy:    She has no cervical adenopathy.  Neurological: She displays normal reflexes. No cranial nerve deficit. She exhibits normal muscle tone. Coordination abnormal.  Skin: No rash noted. No erythema.  Psychiatric: She has a normal mood and affect. Her behavior is normal. Judgment and thought content normal.  LS tender Cane  Lab Results  Component Value Date   WBC 7.2 07/12/2015   HGB 13.1 07/12/2015   HCT 38.3 07/12/2015   PLT 344 07/12/2015   GLUCOSE 90 07/12/2015   CHOL 250 (H) 07/08/2014   TRIG 115.0 07/08/2014   HDL 42.50 07/08/2014   LDLDIRECT 207.7 05/02/2011   LDLCALC 185 (H) 07/08/2014   ALT 15 07/12/2015   AST 21 07/12/2015   NA 138 07/12/2015   K 3.9 07/12/2015   CL 104 07/12/2015   CREATININE 1.02 (H) 07/12/2015   BUN 14 07/12/2015   CO2 24 07/12/2015   TSH 4.05 07/08/2014   HGBA1C 5.8 08/29/2014    Dg Skull 1-3 Views  Result Date: 10/21/2015 CLINICAL DATA:  Memory loss.  Evaluate shunt. EXAM: SKULL - 1-3 VIEW COMPARISON:  None. FINDINGS: Visualized shunt catheter in the right neck, entering in the right posterior occipital region. It is difficult to visualize the portion of the shunt overlying the posterior calvarium. Remainder the shunt appears intact. IMPRESSION: Right shunt catheter as above. Electronically Signed   By: Charlett NoseKevin  Dover M.D.   On: 10/21/2015 13:30   Dg Chest 1 View  Result Date: 10/21/2015 CLINICAL DATA:  Assess status of a ventriculoperitoneal shunt placed 40 years ago; patient reports memory loss and intractable headache. EXAM: CHEST 1 VIEW COMPARISON:  PA and lateral chest x-ray of July 12, 2015 FINDINGS: The ventriculoperitoneal shunt tube is visible extending from the base of the neck to the lower aspect of the right hemi- thorax but it is difficult to follow it more inferiorly until approximately T11 where it becomes visible again.  Visualization is hampered by the prominent dextrocurvature of the lower thoracic spine. Without a lateral view it is difficult to comment further on the shunt. The lungs are clear. The heart is top-normal in size. The pulmonary vascularity is normal. There is moderate S shaped thoracolumbar scoliosis. IMPRESSION: The ventriculoperitoneal shunt tube appears intact down to approximately T8 where it is lost due to overlying bony structures. It is again demonstrated at approximately T11. One cannot exclude discontinuity of the lower thoracic portion of the shunt. Electronically Signed   By: David  SwazilandJordan M.D.   On: 10/21/2015 13:31   Dg Abd 1 View  Result Date: 10/21/2015 CLINICAL DATA:  Evaluate shunt. EXAM: ABDOMEN - 1 VIEW COMPARISON:  None. FINDINGS: The shunt tip is in the right upper quadrant. The bowel gas pattern is unremarkable. The soft tissue shadows are maintained. No worrisome calcifications. The bony structures are intact. IMPRESSION: Shunt  tip in the right upper quadrant. Unremarkable abdominal radiograph. Electronically Signed   By: Rudie Meyer M.D.   On: 10/21/2015 13:30   Ct Head Wo Contrast  Result Date: 10/21/2015 CLINICAL DATA:  52 year old female with chronic shunt since the 1970s. Recurrent headaches. Initial encounter. EXAM: CT HEAD WITHOUT CONTRAST TECHNIQUE: Contiguous axial images were obtained from the base of the skull through the vertex without intravenous contrast. COMPARISON:  Brain MRI 07/12/2015 and earlier. FINDINGS: Sequelae of right frontotemporal craniotomy. There is an intracranial surgical clip at the central skullbase just above the posterior clinoid. This was seen to be near the basilar artery tip on the recent MRI. There is a E right posterior convexity approach ventriculostomy shunt. No acute osseous abnormality identified. Visualized paranasal sinuses and mastoids are clear. Negative visualized orbits soft tissues. Chronic scalp soft tissue scarring. Posterior right  scalp shunt reservoir with tubing which descends in the posterior right neck soft tissues. Chronic ventriculomegaly appears stable since August. The current shunt catheter courses to the right temporal horn and appears stable. Chronic encephalomalacia overlying the right frontal horn. No CT evidence of transependymal edema or acute cortically based infarct. No acute intracranial hemorrhage identified. No midline shift or acute intracranial mass effect. IMPRESSION: 1. Stable appearance of the brain since the MRI in August. No transependymal edema. 2. Chronic ventriculomegaly and right frontal horn region encephalomalacia. 3. Chronic postoperative changes including prior right frontotemporal craniotomy, and chronic surgical clip at the central skullbase near the basilar artery. Electronically Signed   By: Odessa Fleming M.D.   On: 10/21/2015 15:59    Assessment & Plan:   There are no diagnoses linked to this encounter. I have discontinued Samantha [name] omega-3 acid ethyl esters. I am also having her maintain her SUMAtriptan, calcium-vitamin D, aspirin, ranitidine, HYDROcodone-acetaminophen, LORazepam, donepezil, atorvastatin, baclofen, DULoxetine, meloxicam, lamoTRIgine, linaclotide, and Docusate Calcium (STOOL SOFTENER PO).  Meds ordered this encounter  Medications  . Docusate Calcium (STOOL SOFTENER PO)    Sig: Take by mouth 2 (two) times daily.     Follow-up: No Follow-up on file.  Sonda Primes, MD

## 2016-12-30 ENCOUNTER — Ambulatory Visit: Payer: Medicare Other | Admitting: Internal Medicine

## 2017-01-19 ENCOUNTER — Other Ambulatory Visit: Payer: Self-pay | Admitting: Internal Medicine

## 2017-02-09 ENCOUNTER — Ambulatory Visit: Payer: Medicare Other | Admitting: Internal Medicine

## 2017-02-13 DIAGNOSIS — Z79891 Long term (current) use of opiate analgesic: Secondary | ICD-10-CM | POA: Diagnosis not present

## 2017-02-13 DIAGNOSIS — M47812 Spondylosis without myelopathy or radiculopathy, cervical region: Secondary | ICD-10-CM | POA: Diagnosis not present

## 2017-02-13 DIAGNOSIS — G894 Chronic pain syndrome: Secondary | ICD-10-CM | POA: Diagnosis not present

## 2017-02-13 DIAGNOSIS — M47817 Spondylosis without myelopathy or radiculopathy, lumbosacral region: Secondary | ICD-10-CM | POA: Diagnosis not present

## 2017-02-13 DIAGNOSIS — G919 Hydrocephalus, unspecified: Secondary | ICD-10-CM | POA: Diagnosis not present

## 2017-03-14 ENCOUNTER — Telehealth: Payer: Self-pay | Admitting: Internal Medicine

## 2017-03-14 DIAGNOSIS — G919 Hydrocephalus, unspecified: Secondary | ICD-10-CM | POA: Diagnosis not present

## 2017-03-14 DIAGNOSIS — M47812 Spondylosis without myelopathy or radiculopathy, cervical region: Secondary | ICD-10-CM | POA: Diagnosis not present

## 2017-03-14 DIAGNOSIS — G894 Chronic pain syndrome: Secondary | ICD-10-CM | POA: Diagnosis not present

## 2017-03-14 DIAGNOSIS — M5136 Other intervertebral disc degeneration, lumbar region: Secondary | ICD-10-CM | POA: Diagnosis not present

## 2017-03-14 DIAGNOSIS — Z79891 Long term (current) use of opiate analgesic: Secondary | ICD-10-CM | POA: Diagnosis not present

## 2017-03-14 DIAGNOSIS — M47817 Spondylosis without myelopathy or radiculopathy, lumbosacral region: Secondary | ICD-10-CM | POA: Diagnosis not present

## 2017-03-17 ENCOUNTER — Ambulatory Visit (INDEPENDENT_AMBULATORY_CARE_PROVIDER_SITE_OTHER): Payer: Medicare Other | Admitting: Internal Medicine

## 2017-03-17 ENCOUNTER — Encounter: Payer: Self-pay | Admitting: Internal Medicine

## 2017-03-17 VITALS — BP 124/76 | HR 83 | Ht <= 58 in | Wt 137.4 lb

## 2017-03-17 DIAGNOSIS — G919 Hydrocephalus, unspecified: Secondary | ICD-10-CM

## 2017-03-17 DIAGNOSIS — G4733 Obstructive sleep apnea (adult) (pediatric): Secondary | ICD-10-CM | POA: Diagnosis not present

## 2017-03-17 DIAGNOSIS — Z9989 Dependence on other enabling machines and devices: Secondary | ICD-10-CM

## 2017-03-17 NOTE — Patient Instructions (Signed)
We can continue [employer] CPAP auto 5-15, mask of choice, humidifier, supplies, AirView   Dx OSA  Please call as needed

## 2017-03-17 NOTE — Telephone Encounter (Signed)
Patient called back in regard °

## 2017-03-17 NOTE — Telephone Encounter (Signed)
Pt called back in regard. Please advise.    [facility] on [address].  Have we received fax on this from pharmacy?

## 2017-03-17 NOTE — Progress Notes (Signed)
HPI female never smoker followed for OSA, complicated by history of migraine, hydrocephalus/VP shunt/  and concern for nocturnal seizures, Hx CVA for which she is followed by Neurology, back pain NPSG 03/08/15 at [facility] with full seizure montage- AHI 12.1/ hr, desaturation to 91%, no seizure activity   ------------------------------------------------------------------------------------------------  08/25/2016-51 year old female never smoker followed  for OSA, complicated by history of migraine, hydrocephalus/VP shunt and concern for nocturnal seizures, Hx CVA for which she is followed by Neurology NPSG 03/08/15 at [facility] with full seizure montage- AHI 12.1/ hr, desaturation to 91%, no seizure activity CPAP auto 5-15/ [facility]- apparently never got started. Last here 2016. VS patient; Pt needs CPAP machine and never set up by VS. Pt has stroke and hard time remembering things.  Here with a friend. Has now decided she wants to start CPAP She used CPAP remotely when she worked for [employer] and was given a machine  Uses melatonin to help sleep onset while taking Vicodin and baclofen at night for back pain. Denies shortness of breath, nasal symptoms, cough or wheezing.  03/15/17- 51 year old female never smoker followed by Dr. Craige CottaSood for OSA, complicated by history of migraine, hydrocephalus/VP shunt and concern for nocturnal seizures, Hx CVA for which she is followed by Neurology, back pain CPAP auto 5-15/[facility] FOLLOWS FOR DME IS [facility]  Down load is attached patient wears cpap every night and with napping  Download 93%/4 hours, AHI 0.7/hour. Uses melatonin for sleep along with her Cymbalta and says this works well. Using CPAP routinely including with naps, taking a nap most afternoons. She is quite satisfied and definitely sleeps better with CPAP. Settled on a nasal mask.  ROS-see HPI   + = pos Constitutional:    weight loss, night sweats, fevers, chills, fatigue, lassitude. HEENT:     headaches, difficulty swallowing, tooth/dental problems, sore throat,       sneezing, itching, ear ache, nasal congestion, post nasal drip, snoring CV:    chest pain, orthopnea, PND, swelling in lower extremities, anasarca,                                                         dizziness, palpitations Resp:   shortness of breath with exertion or at rest.                productive cough,   non-productive cough, coughing up of blood.              change in color of mucus.  wheezing.   Skin:    rash or lesions. GI:  No-   heartburn, indigestion, abdominal pain, nausea, vomiting, diarrhea,                 change in bowel habits, loss of appetite GU: dysuria, change in color of urine, no urgency or frequency.   flank pain. MS:   joint pain, stiffness, decreased range of motion, back pain. Neuro-     nothing unusual Psych:  change in mood or affect.  depression or anxiety.   memory loss.  OBJ- Physical Exam           stable baseline exam General- Alert, Oriented, Affect-appropriate, Distress- none acute Skin- rash-none, lesions- none, excoriation- none Lymphadenopathy- none Head- atraumatic, + R VP shunt  Eyes- Gross vision intact, PERRLA, conjunctivae and secretions clear            Ears- Hearing, canals-normal            Nose- Clear, no-Septal dev, mucus, polyps, erosion, perforation             Throat- Mallampati III-IV , mucosa clear , drainage- none, tonsils- atrophic Neck- flexible , trachea midline, no stridor , thyroid nl, carotid no bruit Chest - symmetrical excursion , unlabored           Heart/CV- RRR , no murmur , no gallop  , no rub, nl s1 s2                           - JVD- none , edema- none, stasis changes- none, varices- none           Lung- clear to P&A, wheeze- none, cough- none , dullness-none, rub- none           Chest wall-  Abd-  Br/ Gen/ Rectal- Not done, not indicated Extrem- cyanosis- none, clubbing, none, atrophy- none, strength- nl Neuro- grossly  intact to observation

## 2017-03-17 NOTE — Assessment & Plan Note (Signed)
She indicates these problems are under control now managed by neurology.

## 2017-03-17 NOTE — Assessment & Plan Note (Signed)
She is doing very well now, confirmed by download and she says CPAP definitely benefits her. She does still take a nap in the afternoon. Pressure range is comfortable. Plan-continue current settings.

## 2017-03-20 NOTE — Telephone Encounter (Signed)
sch OV 

## 2017-03-20 NOTE — Telephone Encounter (Signed)
Left message for patient to move next months appt up. Can leave appt with Noreene LarssonJill just replace hold with appt.

## 2017-03-21 ENCOUNTER — Ambulatory Visit (INDEPENDENT_AMBULATORY_CARE_PROVIDER_SITE_OTHER): Payer: Medicare Other | Admitting: Internal Medicine

## 2017-03-21 ENCOUNTER — Encounter: Payer: Self-pay | Admitting: Internal Medicine

## 2017-03-21 DIAGNOSIS — R5382 Chronic fatigue, unspecified: Secondary | ICD-10-CM | POA: Diagnosis not present

## 2017-03-21 DIAGNOSIS — F419 Anxiety disorder, unspecified: Secondary | ICD-10-CM

## 2017-03-21 DIAGNOSIS — R413 Other amnesia: Secondary | ICD-10-CM | POA: Diagnosis not present

## 2017-03-21 DIAGNOSIS — G8929 Other chronic pain: Secondary | ICD-10-CM | POA: Diagnosis not present

## 2017-03-21 DIAGNOSIS — F418 Other specified anxiety disorders: Secondary | ICD-10-CM | POA: Diagnosis not present

## 2017-03-21 DIAGNOSIS — K5901 Slow transit constipation: Secondary | ICD-10-CM

## 2017-03-21 DIAGNOSIS — M542 Cervicalgia: Secondary | ICD-10-CM

## 2017-03-21 MED ORDER — MELOXICAM 15 MG PO TABS: 7.5000 mg | ORAL_TABLET | Freq: Every day | ORAL | 0 refills | Status: DC | PRN

## 2017-03-21 MED ORDER — DONEPEZIL HCL 5 MG PO TABS: 5.0000 mg | ORAL_TABLET | Freq: Every day | ORAL | 2 refills | Status: DC

## 2017-03-21 MED ORDER — DULOXETINE HCL 60 MG PO CPEP: ORAL_CAPSULE | ORAL | 2 refills | Status: DC

## 2017-03-21 MED ORDER — LINACLOTIDE 290 MCG PO CAPS: 290.0000 ug | ORAL_CAPSULE | Freq: Every day | ORAL | 1 refills | Status: DC

## 2017-03-21 MED ORDER — LORAZEPAM 1 MG PO TABS: ORAL_TABLET | ORAL | 0 refills | Status: DC

## 2017-03-21 NOTE — Progress Notes (Signed)
Subjective:  Patient ID: Samantha [name], female    DOB: 05/20/1966  Age: 51 y.o. MRN: [id number]  CC: No chief complaint on file.   HPI Samantha [name] presents for CVA, dyslipidemia, chronic pain and depression f/u. C/o spells of "severe fatigue and muscle twitching" that would last for 1 min - several a day x 2 weeks...  Outpatient Medications Prior to Visit  Medication Sig Dispense Refill  . aspirin 81 MG tablet Take 81 mg by mouth daily.    Marland Kitchen. atorvastatin (LIPITOR) 20 MG tablet TAKE 1 TABLET BY MOUTH DAILY 90 tablet 3  . baclofen (LIORESAL) 10 MG tablet     . Calcium Carbonate-Vitamin D (CALCIUM-VITAMIN D) 500-200 MG-UNIT per tablet Take 1 tablet by mouth 2 (two) times daily with a meal.    . Docusate Calcium (STOOL SOFTENER PO) Take by mouth 2 (two) times daily.    Marland Kitchen. donepezil (ARICEPT) 5 MG tablet TAKE ONE TABLET BY MOUTH EVERY NIGHT AT BEDTIME 90 tablet 1  . DULoxetine (CYMBALTA) 60 MG capsule TAKE 1 CAPSULE (60 MG TOTAL) BY MOUTH DAILY. 90 capsule 2  . HYDROcodone-acetaminophen (NORCO) 10-325 MG tablet Take 0.5-1 tablets by mouth every 6 (six) hours as needed for severe pain. 120 tablet 0  . lamoTRIgine (LAMICTAL) 100 MG tablet TAKE 1 TAB BY MOUTH IN THE MORNING, AND THEN TAKE 1 1/2 TAB BY MOUTH AT NIGHT 240 tablet 0  . linaclotide (LINZESS) 290 MCG CAPS capsule Take 1 capsule (290 mcg total) by mouth daily. 90 capsule 1  . LORazepam (ATIVAN) 1 MG tablet TAKE ONE (1) TABLET BY MOUTH TWO (2) TIMES DAILY AS NEEDED 180 tablet 0  . meloxicam (MOBIC) 15 MG tablet Take 0.5-1 tablets (7.5-15 mg total) by mouth daily as needed for pain. 90 tablet 0  . ranitidine (ZANTAC) 150 MG tablet TAKE 1 TABLET BY MOUTH TWICE A DAY X 2 WEEKS,THEN ONCE DAILY 60 tablet 0  . SUMAtriptan (IMITREX) 100 MG tablet Take 1 tablet (100 mg total) by mouth daily as needed. 10 tablet 11   No facility-administered medications prior to visit.     ROS Review of Systems  Constitutional: Positive for fatigue.  Negative for activity change, appetite change, chills and unexpected weight change.  HENT: Negative for congestion, mouth sores and sinus pressure.   Eyes: Negative for visual disturbance.  Respiratory: Negative for cough and chest tightness.   Gastrointestinal: Negative for abdominal pain and nausea.  Genitourinary: Negative for difficulty urinating, frequency and vaginal pain.  Musculoskeletal: Negative for back pain and gait problem.  Skin: Negative for pallor and rash.  Neurological: Positive for weakness. Negative for dizziness, tremors, numbness and headaches.  Psychiatric/Behavioral: Positive for decreased concentration. Negative for confusion, sleep disturbance and suicidal ideas. The patient is nervous/anxious.     Objective:  BP 138/84 (BP Location: Left Arm, Patient Position: Sitting, Cuff Size: Normal)   Pulse 83   Temp 98.1 F (36.7 C) (Oral)   Ht 4\' 10"  (1.473 m)   Wt 138 lb (62.6 kg)   SpO2 99%   BMI 28.84 kg/m   BP Readings from Last 3 Encounters:  03/21/17 138/84  03/17/17 124/76  12/26/16 136/86    Wt Readings from Last 3 Encounters:  03/21/17 138 lb (62.6 kg)  03/17/17 137 lb 6.4 oz (62.3 kg)  12/26/16 133 lb (60.3 kg)    Physical Exam  Constitutional: She appears well-developed. No distress.  HENT:  Head: Normocephalic.  Right Ear: External ear normal.  Left Ear: External ear normal.  Nose: Nose normal.  Mouth/Throat: Oropharynx is clear and moist.  Eyes: Conjunctivae are normal. Pupils are equal, round, and reactive to light. Right eye exhibits no discharge. Left eye exhibits no discharge.  Neck: Normal range of motion. Neck supple. No JVD present. No tracheal deviation present. No thyromegaly present.  Cardiovascular: Normal rate, regular rhythm and normal heart sounds.   Pulmonary/Chest: No stridor. No respiratory distress. She has no wheezes.  Abdominal: Soft. Bowel sounds are normal. She exhibits no distension and no mass. There is no  tenderness. There is no rebound and no guarding.  Musculoskeletal: She exhibits no edema or tenderness.  Lymphadenopathy:    She has no cervical adenopathy.  Neurological: She displays normal reflexes. No cranial nerve deficit. She exhibits normal muscle tone. Coordination normal.  Skin: No rash noted. No erythema.  Psychiatric: Her behavior is normal. Judgment and thought content normal.  cane Neck - tender  Lab Results  Component Value Date   WBC 8.2 12/26/2016   HGB 12.1 12/26/2016   HCT 37.0 12/26/2016   PLT 564.0 (H) 12/26/2016   GLUCOSE 102 (H) 12/26/2016   CHOL 205 (H) 12/26/2016   TRIG 102.0 12/26/2016   HDL 52.40 12/26/2016   LDLDIRECT 207.7 05/02/2011   LDLCALC 133 (H) 12/26/2016   ALT 13 12/26/2016   AST 17 12/26/2016   NA 136 12/26/2016   K 4.3 12/26/2016   CL 104 12/26/2016   CREATININE 0.90 12/26/2016   BUN 12 12/26/2016   CO2 25 12/26/2016   TSH 2.66 12/26/2016   HGBA1C 5.8 08/29/2014    Dg Skull 1-3 Views  Result Date: 10/21/2015 CLINICAL DATA:  Memory loss.  Evaluate shunt. EXAM: SKULL - 1-3 VIEW COMPARISON:  None. FINDINGS: Visualized shunt catheter in the right neck, entering in the right posterior occipital region. It is difficult to visualize the portion of the shunt overlying the posterior calvarium. Remainder the shunt appears intact. IMPRESSION: Right shunt catheter as above. Electronically Signed   By: Charlett Nose M.D.   On: 10/21/2015 13:30   Dg Chest 1 View  Result Date: 10/21/2015 CLINICAL DATA:  Assess status of a ventriculoperitoneal shunt placed 40 years ago; patient reports memory loss and intractable headache. EXAM: CHEST 1 VIEW COMPARISON:  PA and lateral chest x-ray of July 12, 2015 FINDINGS: The ventriculoperitoneal shunt tube is visible extending from the base of the neck to the lower aspect of the right hemi- thorax but it is difficult to follow it more inferiorly until approximately T11 where it becomes visible again. Visualization is  hampered by the prominent dextrocurvature of the lower thoracic spine. Without a lateral view it is difficult to comment further on the shunt. The lungs are clear. The heart is top-normal in size. The pulmonary vascularity is normal. There is moderate S shaped thoracolumbar scoliosis. IMPRESSION: The ventriculoperitoneal shunt tube appears intact down to approximately T8 where it is lost due to overlying bony structures. It is again demonstrated at approximately T11. One cannot exclude discontinuity of the lower thoracic portion of the shunt. Electronically Signed   By: David  Swaziland M.D.   On: 10/21/2015 13:31   Dg Abd 1 View  Result Date: 10/21/2015 CLINICAL DATA:  Evaluate shunt. EXAM: ABDOMEN - 1 VIEW COMPARISON:  None. FINDINGS: The shunt tip is in the right upper quadrant. The bowel gas pattern is unremarkable. The soft tissue shadows are maintained. No worrisome calcifications. The bony structures are intact. IMPRESSION: Shunt tip in the right  upper quadrant. Unremarkable abdominal radiograph. Electronically Signed   By: Rudie Meyer M.D.   On: 10/21/2015 13:30   Ct Head Wo Contrast  Result Date: 10/21/2015 CLINICAL DATA:  51 year old female with chronic shunt since the 1970s. Recurrent headaches. Initial encounter. EXAM: CT HEAD WITHOUT CONTRAST TECHNIQUE: Contiguous axial images were obtained from the base of the skull through the vertex without intravenous contrast. COMPARISON:  Brain MRI 07/12/2015 and earlier. FINDINGS: Sequelae of right frontotemporal craniotomy. There is an intracranial surgical clip at the central skullbase just above the posterior clinoid. This was seen to be near the basilar artery tip on the recent MRI. There is a E right posterior convexity approach ventriculostomy shunt. No acute osseous abnormality identified. Visualized paranasal sinuses and mastoids are clear. Negative visualized orbits soft tissues. Chronic scalp soft tissue scarring. Posterior right scalp shunt  reservoir with tubing which descends in the posterior right neck soft tissues. Chronic ventriculomegaly appears stable since August. The current shunt catheter courses to the right temporal horn and appears stable. Chronic encephalomalacia overlying the right frontal horn. No CT evidence of transependymal edema or acute cortically based infarct. No acute intracranial hemorrhage identified. No midline shift or acute intracranial mass effect. IMPRESSION: 1. Stable appearance of the brain since the MRI in August. No transependymal edema. 2. Chronic ventriculomegaly and right frontal horn region encephalomalacia. 3. Chronic postoperative changes including prior right frontotemporal craniotomy, and chronic surgical clip at the central skullbase near the basilar artery. Electronically Signed   By: Odessa Fleming M.D.   On: 10/21/2015 15:59    Assessment & Plan:   There are no diagnoses linked to this encounter. I am having Samantha [name] maintain her SUMAtriptan, calcium-vitamin D, aspirin, ranitidine, HYDROcodone-acetaminophen, atorvastatin, baclofen, DULoxetine, lamoTRIgine, linaclotide, Docusate Calcium (STOOL SOFTENER PO), meloxicam, donepezil, and LORazepam.  No orders of the defined types were placed in this encounter.    Follow-up: No Follow-up on file.  Sonda Primes, MD

## 2017-03-21 NOTE — Assessment & Plan Note (Signed)
Cymbalta, Lexapro Lorazepam

## 2017-03-21 NOTE — Assessment & Plan Note (Signed)
[facility] [place] - Oxy and Baclofen

## 2017-03-21 NOTE — Assessment & Plan Note (Addendum)
On CPAP Labs OK Ortho VS

## 2017-03-21 NOTE — Assessment & Plan Note (Signed)
F/u w/Dr Yan 

## 2017-03-21 NOTE — Assessment & Plan Note (Signed)
Lorazepam prn Not to take w/pain meds  Potential benefits of a long term benzodiazepines  use as well as potential risks  and complications were explained to the patient and were aknowledged.

## 2017-03-21 NOTE — Progress Notes (Signed)
Pre visit review using our clinic review tool, if applicable. No additional management support is needed unless otherwise documented below in the visit note. 

## 2017-03-21 NOTE — Assessment & Plan Note (Signed)
Linzess prn 

## 2017-04-03 DIAGNOSIS — M47817 Spondylosis without myelopathy or radiculopathy, lumbosacral region: Secondary | ICD-10-CM | POA: Diagnosis not present

## 2017-04-03 DIAGNOSIS — M5136 Other intervertebral disc degeneration, lumbar region: Secondary | ICD-10-CM | POA: Diagnosis not present

## 2017-04-23 NOTE — Progress Notes (Deleted)
Subjective:   NASTASSIA BAZALDUA is a 51 y.o. female who presents for an Initial Medicare Annual Wellness Visit.  Review of Systems    No ROS.  Medicare Wellness Visit.    Sleep patterns: {SX; SLEEP PATTERNS:18802::"feels rested on waking","does not get up to void","gets up *** times nightly to void","sleeps *** hours nightly"}.   Home Safety/Smoke Alarms: Feels safe in home. Smoke alarms in place.  Living environment; residence and Firearm Safety: {Rehab home environment / accessibility:30080::"no firearms","firearms stored safely"}. Seat Belt Safety/Bike Helmet: Wears seat belt.   Counseling:   Eye Exam-  Dental-  Female:   Pap- Last 09/05/16      Mammo- Last  02/07/12, BI-RADS CATEGORY 1:  Negative    Dexa scan- N/A       CCS- Last 10/29/14,     Objective:    There were no vitals filed for this visit. There is no height or weight on file to calculate BMI.   Current Medications (verified) Outpatient Encounter Prescriptions as of 04/24/2017  Medication Sig  . aspirin 81 MG tablet Take 81 mg by mouth daily.  Marland Kitchen atorvastatin (LIPITOR) 20 MG tablet TAKE 1 TABLET BY MOUTH DAILY  . baclofen (LIORESAL) 10 MG tablet   . Calcium Carbonate-Vitamin D (CALCIUM-VITAMIN D) 500-200 MG-UNIT per tablet Take 1 tablet by mouth 2 (two) times daily with a meal.  . Docusate Calcium (STOOL SOFTENER PO) Take by mouth 2 (two) times daily.  Marland Kitchen donepezil (ARICEPT) 5 MG tablet Take 1 tablet (5 mg total) by mouth at bedtime.  . DULoxetine (CYMBALTA) 60 MG capsule TAKE 1 CAPSULE (60 MG TOTAL) BY MOUTH DAILY.  Marland Kitchen HYDROcodone-acetaminophen (NORCO) 10-325 MG tablet Take 0.5-1 tablets by mouth every 6 (six) hours as needed for severe pain.  Marland Kitchen lamoTRIgine (LAMICTAL) 100 MG tablet TAKE 1 TAB BY MOUTH IN THE MORNING, AND THEN TAKE 1 1/2 TAB BY MOUTH AT NIGHT  . linaclotide (LINZESS) 290 MCG CAPS capsule Take 1 capsule (290 mcg total) by mouth daily.  Marland Kitchen LORazepam (ATIVAN) 1 MG tablet TAKE ONE (1) TABLET BY MOUTH  TWO (2) TIMES DAILY AS NEEDED  . meloxicam (MOBIC) 15 MG tablet Take 0.5-1 tablets (7.5-15 mg total) by mouth daily as needed for pain.  . ranitidine (ZANTAC) 150 MG tablet TAKE 1 TABLET BY MOUTH TWICE A DAY X 2 WEEKS,THEN ONCE DAILY  . SUMAtriptan (IMITREX) 100 MG tablet Take 1 tablet (100 mg total) by mouth daily as needed.   No facility-administered encounter medications on file as of 04/24/2017.     Allergies (verified) Citalopram hydrobromide   History: Past Medical History:  Diagnosis Date  . Anemia   . Anxiety   . Cerebral ventricular shunt fitting or adjustment   . COMMON MIGRAINE 10/01/2007   Chronic    . Depression   . GERD (gastroesophageal reflux disease)   . GLUCOSE INTOLERANCE 10/01/2007   Qualifier: Diagnosis of  By: Jonny Ruiz MD, Len Blalock   . Hiatal hernia   . Hydrocephalus   . Hyperlipidemia   . HYPERLIPIDEMIA 10/01/2007   Chronic. Lovaza is too $$$ Declined statins due to worries   . IDA (iron deficiency anemia)   . Memory loss   . Migraine   . OBSTRUCTIVE SLEEP APNEA 10/01/2007   NPSG 2006:  AHI 9/hr Started cpap 2009 successfully.     . Paresthesia   . Schatzki's ring   . Sleep apnea   . Stroke [facility]) 03/2013   Past Surgical History:  Procedure Laterality  Date  . Eligha Bridegroom Shunt  1976,   cerebral vascular shunt/ with a revision 1981  . CHOLECYSTECTOMY  1981  . CYST REMOVAL NECK    . Pituitary cyst w/subsequent shunt     Family History  Problem Relation Age of Onset  . Dementia Mother   . Kidney cancer Mother   . Alzheimer's disease Mother   . Migraines Mother   . Colon polyps Mother   . Heart disease Father   . Diabetes Father   . Melanoma Other   . Lung cancer Other   . Lung cancer Other   . Brain cancer Maternal Aunt   . Lung cancer Maternal Uncle   . Esophageal cancer Maternal Grandmother   . Alzheimer's disease Paternal Grandmother   . Colon cancer Neg Hx    Social History   Occupational History  . [employer]     Customer Service Rep  .  disabled    Social History Main Topics  . Smoking status: Never Smoker  . Smokeless tobacco: Never Used  . Alcohol use 0.6 oz/week    1 Glasses of Zainab Crumrine per week     Comment: Social  . Drug use: No  . Sexual activity: Yes    Tobacco Counseling Counseling given: Not Answered   Activities of Daily Living No flowsheet data found.  Immunizations and Health Maintenance Immunization History  Administered Date(s) Administered  . Influenza Whole 09/06/2012  . Influenza,inj,Quad PF,36+ Mos 08/08/2013, 07/07/2014, 08/27/2015, 08/22/2016  . Pneumococcal Conjugate-13 12/26/2016  . Tdap 01/23/2015   Health Maintenance Due  Topic Date Due  . Hepatitis C Screening  June 09, 1966  . HIV Screening  04/13/1979  . MAMMOGRAM  04/12/2014    Patient Care Team: Tresa Garter, MD as PCP - General (Internal Medicine) Plotnikov, Georgina Quint, MD as Referring Physician (Internal Medicine) Mezer, Dimas Aguas, MD (Obstetrics and Gynecology) Levert Feinstein, MD as Consulting Physician (Neurology) Coralyn Helling, MD as Consulting Physician (Pulmonary Disease) Shellia Carwin., MD as Referring Physician (Neurosurgery)  Indicate any recent Medical Services you may have received from other than [facility] providers in the past year (date may be approximate).     Assessment:   This is a routine wellness examination for Jayci. Physical assessment deferred to PCP.   Hearing/Vision screen No exam data present  Dietary issues and exercise activities discussed:   Diet (meal preparation, eat out, water intake, caffeinated beverages, dairy products, fruits and vegetables): {Desc; diets:16563} Breakfast: Lunch:  Dinner:      Goals    None     Depression Screen PHQ 2/9 Scores 04/28/2016  PHQ - 2 Score 1  PHQ- 9 Score 11    Fall Risk Fall Risk  04/28/2016  Falls in the past year? No    Cognitive Function: MMSE - Mini Mental State Exam 03/18/2015 12/18/2014  Orientation to time 5 5  Orientation to Place  5 5  Registration 3 3  Attention/ Calculation 5 5  Recall 2 2  Language- name 2 objects 2 2  Language- repeat 1 1  Language- follow 3 step command 3 3  Language- read & follow direction 1 1  Write a sentence 1 1  Copy design 1 1  Total score 29 29        Screening Tests Health Maintenance  Topic Date Due  . Hepatitis C Screening  1966/10/26  . HIV Screening  04/13/1979  . MAMMOGRAM  04/12/2014  . INFLUENZA VACCINE  06/14/2017  . PAP SMEAR  09/06/2019  .  COLONOSCOPY  10/29/2024  . TETANUS/TDAP  01/22/2025      Plan:     I have personally reviewed and noted the following in the patient's chart:   . Medical and social history . Use of alcohol, tobacco or illicit drugs  . Current medications and supplements . Functional ability and status . Nutritional status . Physical activity . Advanced directives . List of other physicians . Vitals . Screenings to include cognitive, depression, and falls . Referrals and appointments  In addition, I have reviewed and discussed with patient certain preventive protocols, quality metrics, and best practice recommendations. A written personalized care plan for preventive services as well as general preventive health recommendations were provided to patient.     Wanda PlumpJill A Chandria Rookstool, RN   04/23/2017

## 2017-04-23 NOTE — Progress Notes (Deleted)
Pre visit review using our clinic review tool, if applicable. No additional management support is needed unless otherwise documented below in the visit note. 

## 2017-04-24 ENCOUNTER — Ambulatory Visit: Payer: Medicare Other | Admitting: Internal Medicine

## 2017-04-24 ENCOUNTER — Ambulatory Visit: Payer: Medicare Other

## 2017-05-03 ENCOUNTER — Telehealth: Payer: Self-pay | Admitting: Internal Medicine

## 2017-05-03 DIAGNOSIS — M542 Cervicalgia: Principal | ICD-10-CM

## 2017-05-03 DIAGNOSIS — M79605 Pain in left leg: Secondary | ICD-10-CM

## 2017-05-03 DIAGNOSIS — M545 Low back pain, unspecified: Secondary | ICD-10-CM

## 2017-05-03 DIAGNOSIS — M549 Dorsalgia, unspecified: Secondary | ICD-10-CM

## 2017-05-03 DIAGNOSIS — G8929 Other chronic pain: Secondary | ICD-10-CM

## 2017-05-03 NOTE — Telephone Encounter (Signed)
Pt called stating she needs a new referral to a pain management facility, the office she was going to closed down, she states the medicine she needs to relieve her pain Dr. Macario GoldsPlot cannot prescribe. She would like a new referral put in.  I cannot find in the chart where she has ever seen a pain clinic.

## 2017-05-03 NOTE — Telephone Encounter (Signed)
Please advise 

## 2017-05-04 DIAGNOSIS — M5136 Other intervertebral disc degeneration, lumbar region: Secondary | ICD-10-CM | POA: Diagnosis not present

## 2017-05-04 DIAGNOSIS — M47817 Spondylosis without myelopathy or radiculopathy, lumbosacral region: Secondary | ICD-10-CM | POA: Diagnosis not present

## 2017-05-04 DIAGNOSIS — G8929 Other chronic pain: Secondary | ICD-10-CM | POA: Diagnosis not present

## 2017-05-04 NOTE — Telephone Encounter (Signed)
Pt called again regarding this, she would like to know what Dr, Yetta BarreJones thinks of [facility], and she would like a referal put in as soon as possible her last appt was today with her other pain clinic

## 2017-05-08 ENCOUNTER — Other Ambulatory Visit: Payer: Self-pay | Admitting: Internal Medicine

## 2017-05-08 NOTE — Telephone Encounter (Signed)
Any advise on [facility]?

## 2017-05-09 NOTE — Telephone Encounter (Signed)
I think they are a good organization

## 2017-05-09 NOTE — Telephone Encounter (Signed)
Referral entered  

## 2017-05-22 ENCOUNTER — Encounter: Payer: Self-pay | Admitting: Internal Medicine

## 2017-05-22 ENCOUNTER — Ambulatory Visit (INDEPENDENT_AMBULATORY_CARE_PROVIDER_SITE_OTHER): Payer: Medicare Other | Admitting: Internal Medicine

## 2017-05-22 DIAGNOSIS — E785 Hyperlipidemia, unspecified: Secondary | ICD-10-CM

## 2017-05-22 DIAGNOSIS — F419 Anxiety disorder, unspecified: Secondary | ICD-10-CM | POA: Diagnosis not present

## 2017-05-22 DIAGNOSIS — F4323 Adjustment disorder with mixed anxiety and depressed mood: Secondary | ICD-10-CM | POA: Diagnosis not present

## 2017-05-22 DIAGNOSIS — M545 Low back pain, unspecified: Secondary | ICD-10-CM

## 2017-05-22 DIAGNOSIS — M79605 Pain in left leg: Secondary | ICD-10-CM

## 2017-05-22 DIAGNOSIS — G919 Hydrocephalus, unspecified: Secondary | ICD-10-CM

## 2017-05-22 MED ORDER — LINACLOTIDE 290 MCG PO CAPS: 290.0000 ug | ORAL_CAPSULE | Freq: Every day | ORAL | 11 refills | Status: DC

## 2017-05-22 MED ORDER — MELOXICAM 15 MG PO TABS: 7.5000 mg | ORAL_TABLET | Freq: Every day | ORAL | 3 refills | Status: DC | PRN

## 2017-05-22 MED ORDER — ZOSTER VAC RECOMB ADJUVANTED 50 MCG/0.5ML IM SUSR
0.5000 mL | Freq: Once | INTRAMUSCULAR | 1 refills | Status: AC
Start: 2017-05-22 — End: 2017-05-22

## 2017-05-22 NOTE — Progress Notes (Signed)
Subjective:  Patient ID: Samantha [name], female    DOB: Mar 31, 1966  Age: 51 y.o. MRN: [id number]  CC: No chief complaint on file.   HPI Samantha BondMichele S Quintero presents for chronic pain, dyslipidemia, cognitive issues f/u. [facility]...  Outpatient Medications Prior to Visit  Medication Sig Dispense Refill  . aspirin 81 MG tablet Take 81 mg by mouth daily.    Marland Kitchen. atorvastatin (LIPITOR) 20 MG tablet TAKE 1 TABLET BY MOUTH DAILY 90 tablet 3  . baclofen (LIORESAL) 10 MG tablet     . Calcium Carbonate-Vitamin D (CALCIUM-VITAMIN D) 500-200 MG-UNIT per tablet Take 1 tablet by mouth 2 (two) times daily with a meal.    . Docusate Calcium (STOOL SOFTENER PO) Take by mouth 2 (two) times daily.    Marland Kitchen. donepezil (ARICEPT) 5 MG tablet Take 1 tablet (5 mg total) by mouth at bedtime. 90 tablet 2  . DULoxetine (CYMBALTA) 60 MG capsule TAKE 1 CAPSULE (60 MG TOTAL) BY MOUTH DAILY. 90 capsule 2  . HYDROcodone-acetaminophen (NORCO) 10-325 MG tablet Take 0.5-1 tablets by mouth every 6 (six) hours as needed for severe pain. 120 tablet 0  . lamoTRIgine (LAMICTAL) 100 MG tablet TAKE 1 TAB BY MOUTH IN THE MORNING, AND THEN TAKE 1 1/2 TAB BY MOUTH AT NIGHT 240 tablet 0  . linaclotide (LINZESS) 290 MCG CAPS capsule Take 1 capsule (290 mcg total) by mouth daily. 90 capsule 1  . LORazepam (ATIVAN) 1 MG tablet TAKE ONE (1) TABLET BY MOUTH TWO (2) TIMES DAILY AS NEEDED 180 tablet 0  . meloxicam (MOBIC) 15 MG tablet Take 0.5-1 tablets (7.5-15 mg total) by mouth daily as needed for pain. 90 tablet 0  . ranitidine (ZANTAC) 150 MG tablet TAKE 1 TABLET BY MOUTH TWICE A DAY X 2 WEEKS,THEN ONCE DAILY 60 tablet 0  . SUMAtriptan (IMITREX) 100 MG tablet Take 1 tablet (100 mg total) by mouth daily as needed. 10 tablet 11   No facility-administered medications prior to visit.     ROS Review of Systems  Constitutional: Positive for fatigue. Negative for activity change, appetite change, chills and unexpected weight  change.  HENT: Negative for congestion, mouth sores and sinus pressure.   Eyes: Negative for visual disturbance.  Respiratory: Negative for cough and chest tightness.   Gastrointestinal: Negative for abdominal pain and nausea.  Genitourinary: Negative for difficulty urinating, frequency and vaginal pain.  Musculoskeletal: Positive for arthralgias, back pain and gait problem.  Skin: Negative for pallor and rash.  Neurological: Negative for dizziness, tremors, weakness, numbness and headaches.  Psychiatric/Behavioral: Positive for decreased concentration. Negative for confusion, sleep disturbance and suicidal ideas. The patient is nervous/anxious.     Objective:  BP 122/84 (BP Location: Left Arm, Patient Position: Sitting, Cuff Size: Normal)   Pulse 72   Temp 98.4 F (36.9 C) (Oral)   Ht 4\' 10"  (1.473 m)   Wt 140 lb (63.5 kg)   SpO2 100%   BMI 29.26 kg/m   BP Readings from Last 3 Encounters:  05/22/17 122/84  03/21/17 138/84  03/17/17 124/76    Wt Readings from Last 3 Encounters:  05/22/17 140 lb (63.5 kg)  03/21/17 138 lb (62.6 kg)  03/17/17 137 lb 6.4 oz (62.3 kg)    Physical Exam  Constitutional: She appears well-developed. No distress.  HENT:  Head: Normocephalic.  Right Ear: External ear normal.  Left Ear: External ear normal.  Nose: Nose normal.  Mouth/Throat: Oropharynx is clear and moist.  Eyes: Conjunctivae are normal. Pupils are equal, round, and reactive to light. Right eye exhibits no discharge. Left eye exhibits no discharge.  Neck: Normal range of motion. Neck supple. No JVD present. No tracheal deviation present. No thyromegaly present.  Cardiovascular: Normal rate, regular rhythm and normal heart sounds.   Pulmonary/Chest: No stridor. No respiratory distress. She has no wheezes.  Abdominal: Soft. Bowel sounds are normal. She exhibits no distension and no mass. There is no tenderness. There is no rebound and no guarding.  Musculoskeletal: She exhibits  tenderness. She exhibits no edema.  Lymphadenopathy:    She has no cervical adenopathy.  Neurological: She displays normal reflexes. No cranial nerve deficit. She exhibits normal muscle tone. Coordination abnormal.  Skin: No rash noted. No erythema.  Psychiatric: She has a normal mood and affect. Her behavior is normal. Judgment and thought content normal.  LS tender Cane  Lab Results  Component Value Date   WBC 8.2 12/26/2016   HGB 12.1 12/26/2016   HCT 37.0 12/26/2016   PLT 564.0 (H) 12/26/2016   GLUCOSE 102 (H) 12/26/2016   CHOL 205 (H) 12/26/2016   TRIG 102.0 12/26/2016   HDL 52.40 12/26/2016   LDLDIRECT 207.7 05/02/2011   LDLCALC 133 (H) 12/26/2016   ALT 13 12/26/2016   AST 17 12/26/2016   NA 136 12/26/2016   K 4.3 12/26/2016   CL 104 12/26/2016   CREATININE 0.90 12/26/2016   BUN 12 12/26/2016   CO2 25 12/26/2016   TSH 2.66 12/26/2016   HGBA1C 5.8 08/29/2014    Dg Skull 1-3 Views  Result Date: 10/21/2015 CLINICAL DATA:  Memory loss.  Evaluate shunt. EXAM: SKULL - 1-3 VIEW COMPARISON:  None. FINDINGS: Visualized shunt catheter in the right neck, entering in the right posterior occipital region. It is difficult to visualize the portion of the shunt overlying the posterior calvarium. Remainder the shunt appears intact. IMPRESSION: Right shunt catheter as above. Electronically Signed   By: Charlett Nose M.D.   On: 10/21/2015 13:30   Dg Chest 1 View  Result Date: 10/21/2015 CLINICAL DATA:  Assess status of a ventriculoperitoneal shunt placed 40 years ago; patient reports memory loss and intractable headache. EXAM: CHEST 1 VIEW COMPARISON:  PA and lateral chest x-ray of July 12, 2015 FINDINGS: The ventriculoperitoneal shunt tube is visible extending from the base of the neck to the lower aspect of the right hemi- thorax but it is difficult to follow it more inferiorly until approximately T11 where it becomes visible again. Visualization is hampered by the prominent  dextrocurvature of the lower thoracic spine. Without a lateral view it is difficult to comment further on the shunt. The lungs are clear. The heart is top-normal in size. The pulmonary vascularity is normal. There is moderate S shaped thoracolumbar scoliosis. IMPRESSION: The ventriculoperitoneal shunt tube appears intact down to approximately T8 where it is lost due to overlying bony structures. It is again demonstrated at approximately T11. One cannot exclude discontinuity of the lower thoracic portion of the shunt. Electronically Signed   By: David  [place] M.D.   On: 10/21/2015 13:31   Dg Abd 1 View  Result Date: 10/21/2015 CLINICAL DATA:  Evaluate shunt. EXAM: ABDOMEN - 1 VIEW COMPARISON:  None. FINDINGS: The shunt tip is in the right upper quadrant. The bowel gas pattern is unremarkable. The soft tissue shadows are maintained. No worrisome calcifications. The bony structures are intact. IMPRESSION: Shunt tip in the right upper quadrant. Unremarkable abdominal radiograph. Electronically Signed   By:  Rudie Meyer M.D.   On: 10/21/2015 13:30   Ct Head Wo Contrast  Result Date: 10/21/2015 CLINICAL DATA:  51 year old female with chronic shunt since the 1970s. Recurrent headaches. Initial encounter. EXAM: CT HEAD WITHOUT CONTRAST TECHNIQUE: Contiguous axial images were obtained from the base of the skull through the vertex without intravenous contrast. COMPARISON:  Brain MRI 07/12/2015 and earlier. FINDINGS: Sequelae of right frontotemporal craniotomy. There is an intracranial surgical clip at the central skullbase just above the posterior clinoid. This was seen to be near the basilar artery tip on the recent MRI. There is a E right posterior convexity approach ventriculostomy shunt. No acute osseous abnormality identified. Visualized paranasal sinuses and mastoids are clear. Negative visualized orbits soft tissues. Chronic scalp soft tissue scarring. Posterior right scalp shunt reservoir with tubing which  descends in the posterior right neck soft tissues. Chronic ventriculomegaly appears stable since August. The current shunt catheter courses to the right temporal horn and appears stable. Chronic encephalomalacia overlying the right frontal horn. No CT evidence of transependymal edema or acute cortically based infarct. No acute intracranial hemorrhage identified. No midline shift or acute intracranial mass effect. IMPRESSION: 1. Stable appearance of the brain since the MRI in August. No transependymal edema. 2. Chronic ventriculomegaly and right frontal horn region encephalomalacia. 3. Chronic postoperative changes including prior right frontotemporal craniotomy, and chronic surgical clip at the central skullbase near the basilar artery. Electronically Signed   By: Odessa Fleming M.D.   On: 10/21/2015 15:59    Assessment & Plan:   There are no diagnoses linked to this encounter. I am having Ms. Schleicher maintain her SUMAtriptan, calcium-vitamin D, aspirin, ranitidine, HYDROcodone-acetaminophen, atorvastatin, baclofen, lamoTRIgine, Docusate Calcium (STOOL SOFTENER PO), linaclotide, donepezil, DULoxetine, meloxicam, and LORazepam.  No orders of the defined types were placed in this encounter.    Follow-up: No Follow-up on file.  Sonda Primes, MD

## 2017-05-22 NOTE — Assessment & Plan Note (Signed)
Cymbalta, Lexapro Lorazepam

## 2017-05-22 NOTE — Assessment & Plan Note (Signed)
Cymbalta 

## 2017-05-22 NOTE — Assessment & Plan Note (Signed)
F/u w/Dr Yan 

## 2017-05-22 NOTE — Assessment & Plan Note (Addendum)
[facility] [place] [facility]

## 2017-05-22 NOTE — Assessment & Plan Note (Signed)
Lipitor 

## 2017-06-02 DIAGNOSIS — M2042 Other hammer toe(s) (acquired), left foot: Secondary | ICD-10-CM | POA: Diagnosis not present

## 2017-06-02 DIAGNOSIS — M21612 Bunion of left foot: Secondary | ICD-10-CM | POA: Diagnosis not present

## 2017-06-02 DIAGNOSIS — M21611 Bunion of right foot: Secondary | ICD-10-CM | POA: Diagnosis not present

## 2017-06-02 DIAGNOSIS — M79672 Pain in left foot: Secondary | ICD-10-CM | POA: Diagnosis not present

## 2017-06-28 ENCOUNTER — Encounter: Payer: Self-pay | Admitting: Internal Medicine

## 2017-06-28 ENCOUNTER — Ambulatory Visit (INDEPENDENT_AMBULATORY_CARE_PROVIDER_SITE_OTHER): Payer: Medicare Other | Admitting: Internal Medicine

## 2017-06-28 VITALS — BP 118/100 | HR 82 | Temp 98.1°F | Ht <= 58 in

## 2017-06-28 DIAGNOSIS — J302 Other seasonal allergic rhinitis: Secondary | ICD-10-CM

## 2017-06-28 DIAGNOSIS — F419 Anxiety disorder, unspecified: Secondary | ICD-10-CM | POA: Diagnosis not present

## 2017-06-28 DIAGNOSIS — E739 Lactose intolerance, unspecified: Secondary | ICD-10-CM

## 2017-06-28 DIAGNOSIS — M542 Cervicalgia: Secondary | ICD-10-CM

## 2017-06-28 MED ORDER — CYCLOBENZAPRINE HCL 5 MG PO TABS: 5.0000 mg | ORAL_TABLET | Freq: Three times a day (TID) | ORAL | 1 refills | Status: DC | PRN

## 2017-06-28 MED ORDER — METHYLPREDNISOLONE ACETATE 80 MG/ML IJ SUSP
80.0000 mg | Freq: Once | INTRAMUSCULAR | Status: AC
  Administered 2017-06-28: 80 mg via INTRAMUSCULAR

## 2017-06-28 MED ORDER — PREDNISONE 10 MG PO TABS: ORAL_TABLET | ORAL | 0 refills | Status: DC

## 2017-06-28 MED ORDER — OXYCODONE-ACETAMINOPHEN 10-325 MG PO TABS: 1.0000 | ORAL_TABLET | Freq: Four times a day (QID) | ORAL | 0 refills | Status: DC | PRN

## 2017-06-28 NOTE — Progress Notes (Signed)
Subjective:    Patient ID: Samantha [name], female    DOB: 08/15/1966, 51 y.o.   MRN: [id number]  HPI  Here with bilat post neck pain x 3 days after falling asleep for several hours with head bent to the chest sitting in back seat while husband driving.  Severe, constant, sharp, worse to any movement, better to lie down trying to be still.  Pt denies bowel or bladder change, fever, wt loss,  Worsening UE or LE pain/numbness/weakness, gait change or falls.  No fever, trauma.  Does have hx of known c-spine DDD/DDD per husband.   Pt denies polydipsia, polyuria Denies worsening depressive symptoms, suicidal ideation, or panic; has ongoing anxiety   Also, Does have several wks ongoing nasal allergy symptoms with clearish congestion, itch and sneezing, without fever, pain, ST, cough, swelling or wheezing. Past Medical History:  Diagnosis Date  . Anemia   . Anxiety   . Cerebral ventricular shunt fitting or adjustment   . COMMON MIGRAINE 10/01/2007   Chronic    . Depression   . GERD (gastroesophageal reflux disease)   . GLUCOSE INTOLERANCE 10/01/2007   Qualifier: Diagnosis of  By: Jonny Ruiz MD, Len Blalock   . Hiatal hernia   . Hydrocephalus   . Hyperlipidemia   . HYPERLIPIDEMIA 10/01/2007   Chronic. Lovaza is too $$$ Declined statins due to worries   . IDA (iron deficiency anemia)   . Memory loss   . Migraine   . OBSTRUCTIVE SLEEP APNEA 10/01/2007   NPSG 2006:  AHI 9/hr Started cpap 2009 successfully.     . Paresthesia   . Schatzki's ring   . Sleep apnea   . Stroke [facility]) 03/2013   Past Surgical History:  Procedure Laterality Date  . [place] Shunt  [id number],   cerebral vascular shunt/ with a revision 1981  . CHOLECYSTECTOMY  1981  . CYST REMOVAL NECK    . Pituitary cyst w/subsequent shunt      reports that she has never smoked. She has never used smokeless tobacco. She reports that she drinks about 0.6 oz of alcohol per week . She reports that she does not use drugs. family history includes  Alzheimer's disease in her mother and paternal grandmother; Brain cancer in her maternal aunt; Colon polyps in her mother; Dementia in her mother; Diabetes in her father; Esophageal cancer in her maternal grandmother; Heart disease in her father; Kidney cancer in her mother; Lung cancer in her maternal uncle, other, and other; Melanoma in her other; Migraines in her mother. Allergies  Allergen Reactions  . Citalopram Hydrobromide     REACTION: low libido   Current Outpatient Prescriptions on File Prior to Visit  Medication Sig Dispense Refill  . aspirin 81 MG tablet Take 81 mg by mouth daily.    Marland Kitchen atorvastatin (LIPITOR) 20 MG tablet TAKE 1 TABLET BY MOUTH DAILY 90 tablet 3  . baclofen (LIORESAL) 10 MG tablet     . Calcium Carbonate-Vitamin D (CALCIUM-VITAMIN D) 500-200 MG-UNIT per tablet Take 1 tablet by mouth 2 (two) times daily with a meal.    . Docusate Calcium (STOOL SOFTENER PO) Take by mouth 2 (two) times daily.    Marland Kitchen donepezil (ARICEPT) 5 MG tablet Take 1 tablet (5 mg total) by mouth at bedtime. 90 tablet 2  . DULoxetine (CYMBALTA) 60 MG capsule TAKE 1 CAPSULE (60 MG TOTAL) BY MOUTH DAILY. 90 capsule 2  . lamoTRIgine (LAMICTAL) 100 MG tablet TAKE 1 TAB BY MOUTH IN THE MORNING,  AND THEN TAKE 1 1/2 TAB BY MOUTH AT NIGHT 240 tablet 0  . linaclotide (LINZESS) 290 MCG CAPS capsule Take 1 capsule (290 mcg total) by mouth daily. 30 capsule 11  . LORazepam (ATIVAN) 1 MG tablet TAKE ONE (1) TABLET BY MOUTH TWO (2) TIMES DAILY AS NEEDED 180 tablet 0  . meloxicam (MOBIC) 15 MG tablet Take 0.5-1 tablets (7.5-15 mg total) by mouth daily as needed for pain. 30 tablet 3  . ranitidine (ZANTAC) 150 MG tablet TAKE 1 TABLET BY MOUTH TWICE A DAY X 2 WEEKS,THEN ONCE DAILY 60 tablet 0  . SUMAtriptan (IMITREX) 100 MG tablet Take 1 tablet (100 mg total) by mouth daily as needed. 10 tablet 11   No current facility-administered medications on file prior to visit.    Review of Systems  Constitutional: Negative  for other unusual diaphoresis or sweats HENT: Negative for ear discharge or swelling Eyes: Negative for other worsening visual disturbances Respiratory: Negative for stridor or other swelling  Gastrointestinal: Negative for worsening distension or other blood Genitourinary: Negative for retention or other urinary change Musculoskeletal: Negative for other MSK pain or swelling Skin: Negative for color change or other new lesions Neurological: Negative for worsening tremors and other numbness  Psychiatric/Behavioral: Negative for worsening agitation or other fatigue All other system neg per pt    Objective:   Physical Exam BP (!) 118/100   Pulse 82   Temp 98.1 F (36.7 C)   Ht 4\' 10"  (1.473 m)   SpO2 98%  VS noted, not ill but appaers in marked pain, lying on exam table for pain improvement and trying not to move Constitutional: Pt appears in NAD HENT: Head: NCAT.  Right Ear: External ear normal.  Left Ear: External ear normal.  Eyes: . Pupils are equal, round, and reactive to light. Conjunctivae and EOM are normal Nose: without d/c or deformity Neck:  gross normal ROM but marked tender bilat paracervical area , without rash or swelling No midline cervical or other spine tenderness except mild tender lowest lumbar Cardiovascular: Normal rate and regular rhythm.   Pulmonary/Chest: Effort normal and breath sounds without rales or wheezing.  Neurological: Pt is alert. At baseline orientation, motor 5/5 intact Skin: Skin is warm. No rashes, other new lesions, no LE edema Psychiatric: Pt behavior is normal without agitation but 2+ nervous and shaking somewhat  No other exam findings    Assessment & Plan:

## 2017-06-28 NOTE — Patient Instructions (Signed)
You had the steroid shot today  Please take all new medication as prescribed - the percocet (you actually were taking the hydrocodone before, so dont take together), the prednisone, and the muscle relaxer  Please continue all other medications as before, and refills have been done if requested.  Please have the pharmacy call with any other refills you may need.  Please keep your appointments with your specialists as you may have planned

## 2017-06-29 ENCOUNTER — Telehealth: Payer: Self-pay | Admitting: Internal Medicine

## 2017-06-29 DIAGNOSIS — M79671 Pain in right foot: Secondary | ICD-10-CM

## 2017-06-29 DIAGNOSIS — M549 Dorsalgia, unspecified: Secondary | ICD-10-CM

## 2017-06-29 DIAGNOSIS — M79606 Pain in leg, unspecified: Secondary | ICD-10-CM

## 2017-06-29 DIAGNOSIS — M542 Cervicalgia: Secondary | ICD-10-CM

## 2017-06-29 DIAGNOSIS — M79672 Pain in left foot: Secondary | ICD-10-CM

## 2017-06-29 MED ORDER — LIDOCAINE 5 % EX PTCH: 1.0000 | MEDICATED_PATCH | CUTANEOUS | 0 refills | Status: DC

## 2017-06-29 NOTE — Telephone Encounter (Signed)
Routing to dr plotnikov, please advise, thanks 

## 2017-06-29 NOTE — Telephone Encounter (Signed)
Dr. Jonny RuizJohn please advise on referral.   Pt and husband was explained yesterday that she was not previously on Percocet but was on Norco.

## 2017-06-29 NOTE — Telephone Encounter (Signed)
I agree, the med list showed she had been on hydrocodone.  I dont there is any need for change in med, especially since I would not prescribe anything stronger like morphine.  I can add lidoderm patch for the neck to see if that helps.  I would prefer if the referral to rheumatology was felt needed per PCP, since we were seeing her for acute neck pain only..  thanks

## 2017-06-29 NOTE — Telephone Encounter (Signed)
Patient states she was diagnosis with osteoarthritis.  Does not know if it was Dr. Macario GoldsPlot [place].  [facility]

## 2017-06-29 NOTE — Telephone Encounter (Signed)
Pls keep next ROV w/me Thx

## 2017-06-29 NOTE — Telephone Encounter (Signed)
Pt husband called sating his wife the pt saw John yesterday and Jonny RuizJohn believes she is having bad pain from arthritis, they would like a referral to a Rheumatologists,   Also Jonny RuizJohn said he wanted to try something different for pain but he prescribed Oxycodone and she was taking that before, They would like to now if there is something else she can try.   Please advise and call husband back

## 2017-06-29 NOTE — Telephone Encounter (Signed)
Pt has been informed and expressed understanding. He would like Dr. Macario GoldsPlot to do the referral. Kathie RhodesBetty please check and see if that is something he is willing to do. Please call pt's husband for any questions.

## 2017-06-29 NOTE — Telephone Encounter (Signed)
Pls see me in a f/u - we need to discuss it Thx

## 2017-06-30 NOTE — Telephone Encounter (Signed)
Left message advising mark, patient's husband, that per dr Posey Rea, patient needs to keep office visit in October to discuss need for rheumatology referral---typically, neck pain (which is what was discussed at last office visit) is not an issue a rheumatologist will handle---that is handled by a pain mgmnt specialist---so if she has other pain/arthritis related issues, there needs to be an office visit addressing that concern before a referral to rheumatology can be placed (in order for insurance to cover)---call back if any questions

## 2017-06-30 NOTE — Telephone Encounter (Signed)
Notified pt w/MD response. Made appt for next Tues 07/04/17 @ 11:15...[name]

## 2017-07-01 DIAGNOSIS — J302 Other seasonal allergic rhinitis: Secondary | ICD-10-CM | POA: Insufficient documentation

## 2017-07-01 NOTE — Assessment & Plan Note (Signed)
Exam c/w severe msk strain without neuro changes, for depomedrol IM 80, pain control, prednisone and muscle relaxer prn,  to f/u any worsening symptoms or concerns

## 2017-07-01 NOTE — Assessment & Plan Note (Signed)
Lab Results  Component Value Date   HGBA1C 5.8 08/29/2014  stable, for pt to call for cbg > 200 or onset polys with tx

## 2017-07-01 NOTE — Assessment & Plan Note (Signed)
Also to improve with depomedrol and prednisone, cont current antihistamine and nasal steroid

## 2017-07-01 NOTE — Assessment & Plan Note (Signed)
Tried to reassurance, ok to continue curent tx, declines need for counseling,  to f/u any worsening symptoms or concerns

## 2017-07-03 ENCOUNTER — Telehealth: Payer: Self-pay

## 2017-07-03 NOTE — Telephone Encounter (Signed)
Submitted 07/03/17 Key: Delfina Redwood

## 2017-07-04 ENCOUNTER — Encounter: Payer: Self-pay | Admitting: Internal Medicine

## 2017-07-04 ENCOUNTER — Ambulatory Visit (INDEPENDENT_AMBULATORY_CARE_PROVIDER_SITE_OTHER): Payer: Medicare Other | Admitting: Internal Medicine

## 2017-07-04 DIAGNOSIS — M545 Low back pain, unspecified: Secondary | ICD-10-CM

## 2017-07-04 DIAGNOSIS — M542 Cervicalgia: Secondary | ICD-10-CM

## 2017-07-04 DIAGNOSIS — F419 Anxiety disorder, unspecified: Secondary | ICD-10-CM | POA: Diagnosis not present

## 2017-07-04 DIAGNOSIS — K5901 Slow transit constipation: Secondary | ICD-10-CM

## 2017-07-04 DIAGNOSIS — M79605 Pain in left leg: Secondary | ICD-10-CM

## 2017-07-04 MED ORDER — POLYETHYLENE GLYCOL 3350 17 GM/SCOOP PO POWD: 17.0000 g | Freq: Two times a day (BID) | ORAL | 5 refills | Status: DC | PRN

## 2017-07-04 NOTE — Progress Notes (Signed)
Subjective:  Patient ID: Samantha [name], female    DOB: 05-09-1966  Age: 51 y.o. MRN: [id number]  CC: No chief complaint on file.   HPI AADHIRA SHEROUSE presents for severe B neck pain that started after she fell asleep in the car (20 min) 1 week ago. Pain is better after steroids...  Outpatient Medications Prior to Visit  Medication Sig Dispense Refill  . aspirin 81 MG tablet Take 81 mg by mouth daily.    Marland Kitchen atorvastatin (LIPITOR) 20 MG tablet TAKE 1 TABLET BY MOUTH DAILY 90 tablet 3  . baclofen (LIORESAL) 10 MG tablet     . Calcium Carbonate-Vitamin D (CALCIUM-VITAMIN D) 500-200 MG-UNIT per tablet Take 1 tablet by mouth 2 (two) times daily with a meal.    . cyclobenzaprine (FLEXERIL) 5 MG tablet Take 1 tablet (5 mg total) by mouth 3 (three) times daily as needed for muscle spasms. 30 tablet 1  . Docusate Calcium (STOOL SOFTENER PO) Take by mouth 2 (two) times daily.    Marland Kitchen donepezil (ARICEPT) 5 MG tablet Take 1 tablet (5 mg total) by mouth at bedtime. 90 tablet 2  . DULoxetine (CYMBALTA) 60 MG capsule TAKE 1 CAPSULE (60 MG TOTAL) BY MOUTH DAILY. 90 capsule 2  . lamoTRIgine (LAMICTAL) 100 MG tablet TAKE 1 TAB BY MOUTH IN THE MORNING, AND THEN TAKE 1 1/2 TAB BY MOUTH AT NIGHT 240 tablet 0  . lidocaine (LIDODERM) 5 % Place 1 patch onto the skin daily. Remove & Discard patch within 12 hours or as directed by MD 60 patch 0  . linaclotide (LINZESS) 290 MCG CAPS capsule Take 1 capsule (290 mcg total) by mouth daily. 30 capsule 11  . LORazepam (ATIVAN) 1 MG tablet TAKE ONE (1) TABLET BY MOUTH TWO (2) TIMES DAILY AS NEEDED 180 tablet 0  . meloxicam (MOBIC) 15 MG tablet Take 0.5-1 tablets (7.5-15 mg total) by mouth daily as needed for pain. 30 tablet 3  . oxyCODONE-acetaminophen (PERCOCET) 10-325 MG tablet Take 1 tablet by mouth every 6 (six) hours as needed for pain. 30 tablet 0  . ranitidine (ZANTAC) 150 MG tablet TAKE 1 TABLET BY MOUTH TWICE A DAY X 2 WEEKS,THEN ONCE DAILY 60 tablet 0  .  SUMAtriptan (IMITREX) 100 MG tablet Take 1 tablet (100 mg total) by mouth daily as needed. 10 tablet 11  . predniSONE (DELTASONE) 10 MG tablet 2 tabs by mouth per day for 5 days 10 tablet 0   No facility-administered medications prior to visit.     ROS Review of Systems  Constitutional: Positive for fatigue. Negative for activity change, appetite change, chills and unexpected weight change.  HENT: Negative for congestion, mouth sores and sinus pressure.   Eyes: Negative for visual disturbance.  Respiratory: Negative for cough and chest tightness.   Gastrointestinal: Negative for abdominal pain and nausea.  Genitourinary: Negative for difficulty urinating, frequency and vaginal pain.  Musculoskeletal: Positive for arthralgias, back pain, gait problem, neck pain and neck stiffness.  Skin: Negative for pallor and rash.  Neurological: Negative for dizziness, tremors, weakness, numbness and headaches.  Psychiatric/Behavioral: Positive for decreased concentration. Negative for confusion and sleep disturbance.    Objective:  BP 132/90 (BP Location: Left Arm, Patient Position: Supine, Cuff Size: Normal)   Pulse 87   Temp 98.5 F (36.9 C) (Oral)   SpO2 99%   BP Readings from Last 3 Encounters:  07/04/17 132/90  06/28/17 (!) 118/100  05/22/17 122/84    Wt Readings from  Last 3 Encounters:  05/22/17 140 lb (63.5 kg)  03/21/17 138 lb (62.6 kg)  03/17/17 137 lb 6.4 oz (62.3 kg)    Physical Exam  Constitutional: She appears well-developed. No distress.  HENT:  Head: Normocephalic.  Right Ear: External ear normal.  Left Ear: External ear normal.  Nose: Nose normal.  Mouth/Throat: Oropharynx is clear and moist.  Eyes: Pupils are equal, round, and reactive to light. Conjunctivae are normal. Right eye exhibits no discharge. Left eye exhibits no discharge.  Neck: Normal range of motion. Neck supple. No JVD present. No tracheal deviation present. No thyromegaly present.  Cardiovascular:  Normal rate, regular rhythm and normal heart sounds.   Pulmonary/Chest: No stridor. No respiratory distress. She has no wheezes.  Abdominal: Soft. Bowel sounds are normal. She exhibits no distension and no mass. There is no tenderness. There is no rebound and no guarding.  Musculoskeletal: She exhibits tenderness. She exhibits no edema.  Lymphadenopathy:    She has no cervical adenopathy.  Neurological: She displays normal reflexes. No cranial nerve deficit. She exhibits normal muscle tone. Coordination normal.  Skin: No rash noted. No erythema.  Psychiatric: She has a normal mood and affect. Her behavior is normal. Judgment and thought content normal.  neck - tender w/ROM  Lab Results  Component Value Date   WBC 8.2 12/26/2016   HGB 12.1 12/26/2016   HCT 37.0 12/26/2016   PLT 564.0 (H) 12/26/2016   GLUCOSE 102 (H) 12/26/2016   CHOL 205 (H) 12/26/2016   TRIG 102.0 12/26/2016   HDL 52.40 12/26/2016   LDLDIRECT 207.7 05/02/2011   LDLCALC 133 (H) 12/26/2016   ALT 13 12/26/2016   AST 17 12/26/2016   NA 136 12/26/2016   K 4.3 12/26/2016   CL 104 12/26/2016   CREATININE 0.90 12/26/2016   BUN 12 12/26/2016   CO2 25 12/26/2016   TSH 2.66 12/26/2016   HGBA1C 5.8 08/29/2014    Dg Skull 1-3 Views  Result Date: 10/21/2015 CLINICAL DATA:  Memory loss.  Evaluate shunt. EXAM: SKULL - 1-3 VIEW COMPARISON:  None. FINDINGS: Visualized shunt catheter in the right neck, entering in the right posterior occipital region. It is difficult to visualize the portion of the shunt overlying the posterior calvarium. Remainder the shunt appears intact. IMPRESSION: Right shunt catheter as above. Electronically Signed   By: Charlett Nose M.D.   On: 10/21/2015 13:30   Dg Chest 1 View  Result Date: 10/21/2015 CLINICAL DATA:  Assess status of a ventriculoperitoneal shunt placed 40 years ago; patient reports memory loss and intractable headache. EXAM: CHEST 1 VIEW COMPARISON:  PA and lateral chest x-ray of July 12, 2015 FINDINGS: The ventriculoperitoneal shunt tube is visible extending from the base of the neck to the lower aspect of the right hemi- thorax but it is difficult to follow it more inferiorly until approximately T11 where it becomes visible again. Visualization is hampered by the prominent dextrocurvature of the lower thoracic spine. Without a lateral view it is difficult to comment further on the shunt. The lungs are clear. The heart is top-normal in size. The pulmonary vascularity is normal. There is moderate S shaped thoracolumbar scoliosis. IMPRESSION: The ventriculoperitoneal shunt tube appears intact down to approximately T8 where it is lost due to overlying bony structures. It is again demonstrated at approximately T11. One cannot exclude discontinuity of the lower thoracic portion of the shunt. Electronically Signed   By: David  [place] M.D.   On: 10/21/2015 13:31   Dg Abd  1 View  Result Date: 10/21/2015 CLINICAL DATA:  Evaluate shunt. EXAM: ABDOMEN - 1 VIEW COMPARISON:  None. FINDINGS: The shunt tip is in the right upper quadrant. The bowel gas pattern is unremarkable. The soft tissue shadows are maintained. No worrisome calcifications. The bony structures are intact. IMPRESSION: Shunt tip in the right upper quadrant. Unremarkable abdominal radiograph. Electronically Signed   By: Rudie Meyer M.D.   On: 10/21/2015 13:30   Ct Head Wo Contrast  Result Date: 10/21/2015 CLINICAL DATA:  51 year old female with chronic shunt since the 1970s. Recurrent headaches. Initial encounter. EXAM: CT HEAD WITHOUT CONTRAST TECHNIQUE: Contiguous axial images were obtained from the base of the skull through the vertex without intravenous contrast. COMPARISON:  Brain MRI 07/12/2015 and earlier. FINDINGS: Sequelae of right frontotemporal craniotomy. There is an intracranial surgical clip at the central skullbase just above the posterior clinoid. This was seen to be near the basilar artery tip on the recent MRI.  There is a E right posterior convexity approach ventriculostomy shunt. No acute osseous abnormality identified. Visualized paranasal sinuses and mastoids are clear. Negative visualized orbits soft tissues. Chronic scalp soft tissue scarring. Posterior right scalp shunt reservoir with tubing which descends in the posterior right neck soft tissues. Chronic ventriculomegaly appears stable since August. The current shunt catheter courses to the right temporal horn and appears stable. Chronic encephalomalacia overlying the right frontal horn. No CT evidence of transependymal edema or acute cortically based infarct. No acute intracranial hemorrhage identified. No midline shift or acute intracranial mass effect. IMPRESSION: 1. Stable appearance of the brain since the MRI in August. No transependymal edema. 2. Chronic ventriculomegaly and right frontal horn region encephalomalacia. 3. Chronic postoperative changes including prior right frontotemporal craniotomy, and chronic surgical clip at the central skullbase near the basilar artery. Electronically Signed   By: Odessa Fleming M.D.   On: 10/21/2015 15:59    Assessment & Plan:   There are no diagnoses linked to this encounter. I have discontinued Ms. Jorge predniSONE. I am also having her maintain her SUMAtriptan, calcium-vitamin D, aspirin, ranitidine, atorvastatin, baclofen, lamoTRIgine, Docusate Calcium (STOOL SOFTENER PO), donepezil, DULoxetine, LORazepam, linaclotide, meloxicam, oxyCODONE-acetaminophen, cyclobenzaprine, and lidocaine.  No orders of the defined types were placed in this encounter.    Follow-up: No Follow-up on file.  Sonda Primes, MD

## 2017-07-04 NOTE — Assessment & Plan Note (Signed)
Lorazepam prn 

## 2017-07-04 NOTE — Assessment & Plan Note (Signed)
Start PT Rice bag

## 2017-07-04 NOTE — Assessment & Plan Note (Signed)
Linzess Miralax 

## 2017-07-04 NOTE — Patient Instructions (Signed)
Turmeric

## 2017-07-04 NOTE — Assessment & Plan Note (Signed)
Pain Clinic appt is pending

## 2017-07-11 NOTE — Telephone Encounter (Signed)
Pt called stating she received a call for physical therapy for her neck. She told the therapists it was supposed to be for her Neck, back, legs and feet. For insurance to pay for it the referral needs to list all of these issues.  Please advise.

## 2017-07-13 NOTE — Telephone Encounter (Signed)
PA denied.   Can you inform patient of same?

## 2017-07-13 NOTE — Telephone Encounter (Signed)
[facility].  [phone].  Case number [id number].  Please call back in regard. States will also send fax over.  Needing diagnosis code and verify answers for criteria.

## 2017-07-14 ENCOUNTER — Telehealth: Payer: Self-pay

## 2017-07-14 MED ORDER — OXYCODONE-ACETAMINOPHEN 10-325 MG PO TABS: 1.0000 | ORAL_TABLET | Freq: Four times a day (QID) | ORAL | 0 refills | Status: DC | PRN

## 2017-07-14 NOTE — Telephone Encounter (Signed)
Misplaced script from last visit.

## 2017-07-14 NOTE — Telephone Encounter (Signed)
Filled out and faxed back over to them

## 2017-07-14 NOTE — Telephone Encounter (Signed)
Please advise about changing referral, I only saw neck and back pain in last OV

## 2017-07-14 NOTE — Telephone Encounter (Signed)
Received determination via fax and PA was denied. Pt's Dx's did not meet requirements for this type of medication.

## 2017-07-18 NOTE — Telephone Encounter (Signed)
Ok Neck. Back, feet PT. Dx pain Thx

## 2017-07-19 NOTE — Telephone Encounter (Signed)
Pt has been informed.

## 2017-07-19 NOTE — Telephone Encounter (Signed)
Referral created.

## 2017-07-19 NOTE — Telephone Encounter (Signed)
Was patient informed that PA was denied.

## 2017-07-20 ENCOUNTER — Other Ambulatory Visit: Payer: Self-pay | Admitting: Internal Medicine

## 2017-07-20 NOTE — Telephone Encounter (Signed)
Has pt been informed of what the pharmacy stated?

## 2017-07-20 NOTE — Telephone Encounter (Signed)
Pt called in said [facility] by the time she left here.  So [facility] she stated they only gave her 20 pills and not the 30 because something to due with the laws and [place]    I called pharmacy and they said they could only fill the 20 pills  New opioid restriction ins would only cover 20 pills    [facility] [phone]

## 2017-07-21 MED ORDER — OXYCODONE-ACETAMINOPHEN 10-325 MG PO TABS: 1.0000 | ORAL_TABLET | Freq: Four times a day (QID) | ORAL | 0 refills | Status: AC | PRN

## 2017-07-21 NOTE — Telephone Encounter (Signed)
Done hardcopy to Shirron  

## 2017-07-21 NOTE — Addendum Note (Signed)
Addended by: Corwin LevinsJOHN, Zury Fazzino W on: 07/21/2017 12:45 PM   Modules accepted: Orders

## 2017-07-21 NOTE — Telephone Encounter (Signed)
Dr. Jonny RuizJohn please advise. Pt would like another script to make it through to her pain management appt.

## 2017-07-21 NOTE — Telephone Encounter (Signed)
Informed pt Script at front desk  

## 2017-07-21 NOTE — Telephone Encounter (Signed)
Pt called stating on 8/31 Samantha RuizJohn wrote her a script for #30 pills for oxydodone, she had to take this to [facility] since [facility]. She said that since she had not gotten anything filled there since 2017 they only gave her #20. She has 7 pills left and would like enough to get her through to her pain clinic appointment on 9/11. Please advise and call back.

## 2017-07-25 DIAGNOSIS — M722 Plantar fascial fibromatosis: Secondary | ICD-10-CM | POA: Diagnosis not present

## 2017-07-25 DIAGNOSIS — Z5181 Encounter for therapeutic drug level monitoring: Secondary | ICD-10-CM | POA: Diagnosis not present

## 2017-07-25 DIAGNOSIS — M5136 Other intervertebral disc degeneration, lumbar region: Secondary | ICD-10-CM | POA: Diagnosis not present

## 2017-07-25 DIAGNOSIS — Z79899 Other long term (current) drug therapy: Secondary | ICD-10-CM | POA: Diagnosis not present

## 2017-07-25 DIAGNOSIS — M5412 Radiculopathy, cervical region: Secondary | ICD-10-CM | POA: Diagnosis not present

## 2017-07-25 DIAGNOSIS — M5416 Radiculopathy, lumbar region: Secondary | ICD-10-CM | POA: Diagnosis not present

## 2017-08-01 DIAGNOSIS — G8929 Other chronic pain: Secondary | ICD-10-CM | POA: Insufficient documentation

## 2017-08-02 ENCOUNTER — Other Ambulatory Visit: Payer: Self-pay | Admitting: Internal Medicine

## 2017-08-03 ENCOUNTER — Telehealth: Payer: Self-pay | Admitting: Internal Medicine

## 2017-08-04 NOTE — Telephone Encounter (Signed)
Routing to dr plotnikov, please advise, thanks 

## 2017-08-10 NOTE — Telephone Encounter (Signed)
MD is out of office, and patient has been requesting refill since 9/20. Check [place] registry last filled 03/21/2017. Pls advise if ok to refill...[name]

## 2017-08-10 NOTE — Telephone Encounter (Signed)
Patient is calling again to follow up on this.  °

## 2017-08-11 NOTE — Telephone Encounter (Signed)
Msg was forward to once of MD colleague which they did not respond. Forwarding msg back to Dr. Posey Rea for his approval. Dr. Posey Rea pls advise if ok to refill  Pt has been trying to get med filled since 08/03/17...[name]

## 2017-08-13 MED ORDER — LORAZEPAM 1 MG PO TABS: ORAL_TABLET | ORAL | 0 refills | Status: DC

## 2017-08-14 NOTE — Telephone Encounter (Signed)
MD printed script... Had to call in since the ink is going out. Rx not clear sp I called refill into [facility] pharmacy had to leave on pharmacy vm...[name]

## 2017-08-25 ENCOUNTER — Other Ambulatory Visit: Payer: Self-pay | Admitting: Internal Medicine

## 2017-08-25 ENCOUNTER — Ambulatory Visit: Payer: Medicare Other | Admitting: Internal Medicine

## 2017-09-01 ENCOUNTER — Ambulatory Visit: Payer: Medicare Other | Admitting: Internal Medicine

## 2017-09-19 NOTE — Progress Notes (Deleted)
Subjective:   Samantha [name] is a 51 y.o. female who presents for an Initial Medicare Annual Wellness Visit.  Review of Systems    No ROS.  Medicare Wellness Visit. Additional risk factors are reflected in the social history.     Sleep patterns: {SX; SLEEP PATTERNS:18802::"feels rested on waking","does not get up to void","gets up *** times nightly to void","sleeps *** hours nightly"}.    Home Safety/Smoke Alarms: Feels safe in home. Smoke alarms in place.  Living environment; residence and Firearm Safety: {Rehab home environment / accessibility:30080::"no firearms","firearms stored safely"}. Seat Belt Safety/Bike Helmet: Wears seat belt.    Objective:    There were no vitals filed for this visit. There is no height or weight on file to calculate BMI.   Current Medications (verified) Outpatient Encounter Medications as of 09/20/2017  Medication Sig  . aspirin 81 MG tablet Take 81 mg by mouth daily.  Marland Kitchen atorvastatin (LIPITOR) 20 MG tablet TAKE ONE (1) TABLET BY MOUTH EVERY DAY  . baclofen (LIORESAL) 10 MG tablet   . Calcium Carbonate-Vitamin D (CALCIUM-VITAMIN D) 500-200 MG-UNIT per tablet Take 1 tablet by mouth 2 (two) times daily with a meal.  . cyclobenzaprine (FLEXERIL) 5 MG tablet Take 1 tablet (5 mg total) by mouth 3 (three) times daily as needed for muscle spasms.  Tery Sanfilippo Calcium (STOOL SOFTENER PO) Take by mouth 2 (two) times daily.  Marland Kitchen donepezil (ARICEPT) 5 MG tablet Take 1 tablet (5 mg total) by mouth at bedtime.  . DULoxetine (CYMBALTA) 60 MG capsule TAKE 1 CAPSULE (60 MG TOTAL) BY MOUTH DAILY.  Marland Kitchen lamoTRIgine (LAMICTAL) 100 MG tablet TAKE 1 TABLET BY MOUTH IN THE MORNING, AND THEN TAKE 1 AND 1/2 TABLETS BY MOUTHAT NIGHT.  Marland Kitchen lidocaine (LIDODERM) 5 % Place 1 patch onto the skin daily. Remove & Discard patch within 12 hours or as directed by MD  . linaclotide (LINZESS) 290 MCG CAPS capsule Take 1 capsule (290 mcg total) by mouth daily.  Marland Kitchen LORazepam (ATIVAN) 1 MG tablet  TAKE ONE (1) TABLET BY MOUTH TWO (2) TIMES DAILY AS NEEDED  . meloxicam (MOBIC) 15 MG tablet Take 0.5-1 tablets (7.5-15 mg total) by mouth daily as needed for pain.  Marland Kitchen oxyCODONE-acetaminophen (PERCOCET) 10-325 MG tablet Take 1 tablet by mouth every 6 (six) hours as needed for pain.  . polyethylene glycol powder (GLYCOLAX/MIRALAX) powder Take 17 g by mouth 2 (two) times daily as needed.  . ranitidine (ZANTAC) 150 MG tablet TAKE 1 TABLET BY MOUTH TWICE A DAY X 2 WEEKS,THEN ONCE DAILY  . SUMAtriptan (IMITREX) 100 MG tablet Take 1 tablet (100 mg total) by mouth daily as needed.   No facility-administered encounter medications on file as of 09/20/2017.     Allergies (verified) Citalopram hydrobromide   History: Past Medical History:  Diagnosis Date  . Anemia   . Anxiety   . Cerebral ventricular shunt fitting or adjustment   . COMMON MIGRAINE 10/01/2007   Chronic    . Depression   . GERD (gastroesophageal reflux disease)   . GLUCOSE INTOLERANCE 10/01/2007   Qualifier: Diagnosis of  By: Jonny Ruiz MD, Len Blalock   . Hiatal hernia   . Hydrocephalus   . Hyperlipidemia   . HYPERLIPIDEMIA 10/01/2007   Chronic. Lovaza is too $$$ Declined statins due to worries   . IDA (iron deficiency anemia)   . Memory loss   . Migraine   . OBSTRUCTIVE SLEEP APNEA 10/01/2007   NPSG 2006:  AHI 9/hr Started  cpap 2009 successfully.     . Paresthesia   . Schatzki's ring   . Sleep apnea   . Stroke [facility]) 03/2013   Past Surgical History:  Procedure Laterality Date  . [place] Shunt  [id number],   cerebral vascular shunt/ with a revision 1981  . CHOLECYSTECTOMY  1981  . CYST REMOVAL NECK    . Pituitary cyst w/subsequent shunt     Family History  Problem Relation Age of Onset  . Dementia Mother   . Kidney cancer Mother   . Alzheimer's disease Mother   . Migraines Mother   . Colon polyps Mother   . Heart disease Father   . Diabetes Father   . Melanoma Other   . Lung cancer Other   . Lung cancer Other   . Brain  cancer Maternal Aunt   . Lung cancer Maternal Uncle   . Esophageal cancer Maternal Grandmother   . Alzheimer's disease Paternal Grandmother   . Colon cancer Neg Hx    Social History   Occupational History  . Occupation: [employer]    Comment: Clinical biochemistCustomer Service Rep  . Occupation: disabled  Tobacco Use  . Smoking status: Never Smoker  . Smokeless tobacco: Never Used  Substance and Sexual Activity  . Alcohol use: Yes    Alcohol/week: 0.6 oz    Types: 1 Glasses of Elenor Wildes per week    Comment: Social  . Drug use: No  . Sexual activity: Yes    Tobacco Counseling Counseling given: Not Answered   Activities of Daily Living No flowsheet data found.  Immunizations and Health Maintenance Immunization History  Administered Date(s) Administered  . Influenza Whole 09/06/2012  . Influenza,inj,Quad PF,6+ Mos 08/08/2013, 07/07/2014, 08/27/2015, 08/22/2016  . Pneumococcal Conjugate-13 12/26/2016  . Tdap 01/23/2015   Health Maintenance Due  Topic Date Due  . Hepatitis C Screening  Apr 12, 1966  . HIV Screening  04/13/1979  . MAMMOGRAM  04/12/2014    Patient Care Team: Samantha [name], Samantha V, MD as PCP - General (Internal Medicine) Samantha [name], Samantha QuintAleksei V, MD as Referring Physician (Internal Medicine) Samantha [name], Samantha AguasHoward, MD (Obstetrics and Gynecology) Samantha [name], Yijun, MD as Consulting Physician (Neurology) Samantha HellingSood, Vineet, MD as Consulting Physician (Pulmonary Disease) Shellia CarwinBrown, William R Jr., MD as Referring Physician (Neurosurgery)  Indicate any recent Medical Services you may have received from other than [facility] providers in the past year (date may be approximate).     Assessment:   This is a routine wellness examination for Samantha JesterMichele. Physical assessment deferred to PCP.   Hearing/Vision screen No exam data present  Dietary issues and exercise activities discussed:   Diet (meal preparation, eat out, water intake, caffeinated beverages, dairy products, fruits and vegetables): {Desc;  diets:16563}  Goals    None     Depression Screen PHQ 2/9 Scores 04/28/2016  PHQ - 2 Score 1  PHQ- 9 Score 11    Fall Risk Fall Risk  04/28/2016  Falls in the past year? No    Cognitive Function: MMSE - Mini Mental State Exam 03/18/2015 12/18/2014  Orientation to time 5 5  Orientation to Place 5 5  Registration 3 3  Attention/ Calculation 5 5  Recall 2 2  Language- name 2 objects 2 2  Language- repeat 1 1  Language- follow 3 step command 3 3  Language- read & follow direction 1 1  Write a sentence 1 1  Copy design 1 1  Total score 29 29        Screening Tests Health Maintenance  Topic  Date Due  . Hepatitis C Screening  1965/12/13  . HIV Screening  04/13/1979  . MAMMOGRAM  04/12/2014  . INFLUENZA VACCINE  08/09/2018 (Originally 06/14/2017)  . PAP SMEAR  09/06/2019  . COLONOSCOPY  10/29/2024  . TETANUS/TDAP  01/22/2025      Plan:     I have personally reviewed and noted the following in the patient's chart:   . Medical and social history . Use of alcohol, tobacco or illicit drugs  . Current medications and supplements . Functional ability and status . Nutritional status . Physical activity . Advanced directives . List of other physicians . Vitals . Screenings to include cognitive, depression, and falls . Referrals and appointments  In addition, I have reviewed and discussed with patient certain preventive protocols, quality metrics, and best practice recommendations. A written personalized care plan for preventive services as well as general preventive health recommendations were provided to patient.     Wanda PlumpJill A Railynn Ballo, RN   09/19/2017

## 2017-09-20 ENCOUNTER — Ambulatory Visit: Payer: Medicare Other

## 2017-10-11 ENCOUNTER — Telehealth: Payer: Self-pay

## 2017-10-11 MED ORDER — BACLOFEN 10 MG PO TABS: 5.0000 mg | ORAL_TABLET | Freq: Two times a day (BID) | ORAL | 1 refills | Status: DC | PRN

## 2017-10-11 NOTE — Telephone Encounter (Signed)
Ok Thx 

## 2017-10-11 NOTE — Telephone Encounter (Signed)
Pt. Called to ask provider if she can start Baclofen again to use for "breakthrough pain." States she has an appointment 10/17/17 "with the pain doctor".

## 2017-10-11 NOTE — Telephone Encounter (Signed)
See message bellow

## 2017-10-11 NOTE — Addendum Note (Signed)
Addended by: Tresa GarterPLOTNIKOV, Malory Spurr V on: 10/11/2017 08:55 PM   Modules accepted: Orders

## 2017-10-20 DIAGNOSIS — Z8673 Personal history of transient ischemic attack (TIA), and cerebral infarction without residual deficits: Secondary | ICD-10-CM | POA: Diagnosis not present

## 2017-10-20 DIAGNOSIS — M5136 Other intervertebral disc degeneration, lumbar region: Secondary | ICD-10-CM | POA: Diagnosis not present

## 2017-10-20 DIAGNOSIS — Z7982 Long term (current) use of aspirin: Secondary | ICD-10-CM | POA: Diagnosis not present

## 2017-10-20 DIAGNOSIS — Z79899 Other long term (current) drug therapy: Secondary | ICD-10-CM | POA: Diagnosis not present

## 2017-10-20 DIAGNOSIS — G4733 Obstructive sleep apnea (adult) (pediatric): Secondary | ICD-10-CM | POA: Diagnosis not present

## 2017-10-20 DIAGNOSIS — Z888 Allergy status to other drugs, medicaments and biological substances status: Secondary | ICD-10-CM | POA: Diagnosis not present

## 2017-10-20 DIAGNOSIS — G8929 Other chronic pain: Secondary | ICD-10-CM | POA: Diagnosis not present

## 2017-11-13 ENCOUNTER — Telehealth: Payer: Self-pay | Admitting: Internal Medicine

## 2017-11-13 NOTE — Telephone Encounter (Signed)
Pt. Is requesting a new medication. Dr. Loren RacerPlotnikov's appointment availability is a bit of a wait. Please advise. Thanks.

## 2017-11-13 NOTE — Telephone Encounter (Signed)
Copied from CRM [id number]. Topic: Quick Communication - See Telephone Encounter >> Nov 13, 2017  9:44 AM Waymon AmatoBurton, Donna F wrote: CRM for notification. See Telephone encounter for: pt states that she has been taking cymbalta for a while and she feels that it does not work for her any more and would like something else called instead of her coming  Best number [phone]  11/13/17.

## 2017-11-16 ENCOUNTER — Telehealth: Payer: Self-pay | Admitting: Internal Medicine

## 2017-11-16 MED ORDER — VILAZODONE HCL 20 MG PO TABS: 20.0000 mg | ORAL_TABLET | Freq: Every day | ORAL | 6 refills | Status: DC

## 2017-11-16 NOTE — Telephone Encounter (Signed)
Ok to d/c cymbalta and to start Viibrid - Rx emailed [facility] Thx

## 2017-11-16 NOTE — Telephone Encounter (Signed)
Please advise 

## 2017-11-16 NOTE — Telephone Encounter (Signed)
Copied from CRM [id number]. Topic: Quick Communication - See Telephone Encounter >> Nov 16, 2017  1:27 PM Waymon AmatoBurton, Donna F wrote: CRM for notification. See Telephone encounter for:  Pt states she was seen today and the antidepressant that she was given is also 134.00 and she needs a different medicaiton again  Bests number [phone]  11/16/17.

## 2017-11-16 NOTE — Telephone Encounter (Signed)
Per chart pt did not have an appt! Called pt to verify msg pt states Dr. Posey Reaplotnikov sent in another antidepressant " Viibryd"  but w/insurance med will cost $ 134 monthly and she can not afford. She is wanting cheaper alternative...Samantha [name]/lmb

## 2017-11-17 MED ORDER — DULOXETINE HCL 60 MG PO CPEP: 60.0000 mg | ORAL_CAPSULE | Freq: Every day | ORAL | 0 refills | Status: DC

## 2017-11-17 NOTE — Telephone Encounter (Signed)
Pls sch an OV. Cont Cymbalta Thx

## 2017-11-17 NOTE — Telephone Encounter (Signed)
See other TE.

## 2017-11-17 NOTE — Telephone Encounter (Signed)
erx sent per PCP verbal okay.

## 2017-11-17 NOTE — Telephone Encounter (Signed)
We will send in if patient is out of this medication.

## 2017-11-17 NOTE — Telephone Encounter (Addendum)
Pt will need a script sent to [facility]. She will call back to schedule when she has checked with her husband because he is the one that will have to bring her.

## 2017-11-17 NOTE — Telephone Encounter (Signed)
Has this medication been sent? I will call to schedule but want to make sure that I have all of the information before I call her.

## 2017-11-27 ENCOUNTER — Other Ambulatory Visit: Payer: Self-pay | Admitting: Internal Medicine

## 2017-11-28 DIAGNOSIS — Z79899 Other long term (current) drug therapy: Secondary | ICD-10-CM | POA: Diagnosis not present

## 2017-11-28 DIAGNOSIS — Z5181 Encounter for therapeutic drug level monitoring: Secondary | ICD-10-CM | POA: Diagnosis not present

## 2017-11-28 DIAGNOSIS — G8929 Other chronic pain: Secondary | ICD-10-CM | POA: Diagnosis not present

## 2017-11-28 DIAGNOSIS — M5416 Radiculopathy, lumbar region: Secondary | ICD-10-CM | POA: Diagnosis not present

## 2017-11-28 NOTE — Telephone Encounter (Signed)
Check [place] registry last filled 08/14/2017...[name]/lmb

## 2017-12-06 DIAGNOSIS — M419 Scoliosis, unspecified: Secondary | ICD-10-CM | POA: Diagnosis not present

## 2017-12-06 DIAGNOSIS — M47817 Spondylosis without myelopathy or radiculopathy, lumbosacral region: Secondary | ICD-10-CM | POA: Diagnosis not present

## 2017-12-06 DIAGNOSIS — M5126 Other intervertebral disc displacement, lumbar region: Secondary | ICD-10-CM | POA: Diagnosis not present

## 2017-12-06 DIAGNOSIS — M47816 Spondylosis without myelopathy or radiculopathy, lumbar region: Secondary | ICD-10-CM | POA: Diagnosis not present

## 2017-12-15 DIAGNOSIS — M722 Plantar fascial fibromatosis: Secondary | ICD-10-CM | POA: Diagnosis not present

## 2017-12-15 DIAGNOSIS — M5136 Other intervertebral disc degeneration, lumbar region: Secondary | ICD-10-CM | POA: Diagnosis not present

## 2017-12-15 DIAGNOSIS — G8929 Other chronic pain: Secondary | ICD-10-CM | POA: Diagnosis not present

## 2017-12-15 DIAGNOSIS — M5416 Radiculopathy, lumbar region: Secondary | ICD-10-CM | POA: Diagnosis not present

## 2017-12-15 DIAGNOSIS — M5412 Radiculopathy, cervical region: Secondary | ICD-10-CM | POA: Diagnosis not present

## 2018-01-16 DIAGNOSIS — G8929 Other chronic pain: Secondary | ICD-10-CM | POA: Diagnosis not present

## 2018-01-16 DIAGNOSIS — M5136 Other intervertebral disc degeneration, lumbar region: Secondary | ICD-10-CM | POA: Diagnosis not present

## 2018-01-16 DIAGNOSIS — M5412 Radiculopathy, cervical region: Secondary | ICD-10-CM | POA: Diagnosis not present

## 2018-01-16 DIAGNOSIS — M5416 Radiculopathy, lumbar region: Secondary | ICD-10-CM | POA: Diagnosis not present

## 2018-01-16 DIAGNOSIS — M722 Plantar fascial fibromatosis: Secondary | ICD-10-CM | POA: Diagnosis not present

## 2018-01-18 ENCOUNTER — Other Ambulatory Visit: Payer: Self-pay | Admitting: Internal Medicine

## 2018-01-19 ENCOUNTER — Telehealth: Payer: Self-pay | Admitting: Internal Medicine

## 2018-01-19 NOTE — Telephone Encounter (Addendum)
Okay to take meloxicam as needed. Please ask the patient to discuss a muscle relaxer with pain clinic  thank you

## 2018-01-19 NOTE — Telephone Encounter (Signed)
Please advise 

## 2018-01-19 NOTE — Telephone Encounter (Addendum)
Copied from CRM [id number]. Topic: Quick Communication - See Telephone Encounter >> Jan 19, 2018  1:30 PM Oneal GroutSebastian, Jennifer S wrote: CRM for notification. See Telephone encounter for:  [facility], Jannifer Hickobert Ke[place], [place] # [phone]. Is she still to take meloxicam (MOBIC) 15 MG tablet, if so [facility]ill [facility]. Also muscle relaxer they prescribed baclifen 10mg , which is making her sleepy. Ok to take mobic instead. Please advise.

## 2018-01-22 ENCOUNTER — Other Ambulatory Visit: Payer: Self-pay

## 2018-01-22 MED ORDER — MELOXICAM 15 MG PO TABS: ORAL_TABLET | ORAL | 3 refills | Status: DC

## 2018-01-22 NOTE — Telephone Encounter (Signed)
Patient advised of dr plotnikovs note/instructions, rx refill for meloxicam sent to [facility]

## 2018-01-31 ENCOUNTER — Other Ambulatory Visit: Payer: Self-pay | Admitting: Internal Medicine

## 2018-01-31 NOTE — Telephone Encounter (Signed)
Routing to dr plotnikov, please advise, thanks 

## 2018-02-01 ENCOUNTER — Telehealth: Payer: Self-pay | Admitting: Internal Medicine

## 2018-02-01 ENCOUNTER — Other Ambulatory Visit: Payer: Self-pay | Admitting: Internal Medicine

## 2018-02-01 NOTE — Telephone Encounter (Signed)
please call pt and schedule and appointment, an order to increase dosage she will need an appt

## 2018-02-01 NOTE — Telephone Encounter (Signed)
Copied from CRM [id number]. Topic: Quick Communication - See Telephone Encounter >> Feb 01, 2018 10:10 AM Windy KalataMichael, Dublin Cantero L, NT wrote: CRM for notification. See Telephone encounter for: 02/01/18.  Patient is calling and states she needs a refill on LORazepam (ATIVAN) 1 MG tablet and donepezil (ARICEPT) 5 MG tablet and she states she has been on both these medications for awhile and believes the dosage needs to be increased. Please advise. [phone]  [facility] [place] - [address]  [address] [place] [id number]  Phone: [phone] Fax: [phone]

## 2018-02-02 NOTE — Telephone Encounter (Signed)
When I called patient to schedule an appointment she asked about this refill and wanted to make sure that we got it and that it would be filled. Please advise. She is scheduled to see Dr Posey ReaPlotnikov on 02/26/18 (first available).

## 2018-02-05 ENCOUNTER — Telehealth: Payer: Self-pay | Admitting: Internal Medicine

## 2018-02-05 NOTE — Telephone Encounter (Signed)
Copied from CRM [id number]. Topic: Quick Communication - See Telephone Encounter >> Feb 05, 2018 10:10 AM Arlyss Gandyichardson, Mercades Bajaj N, NT wrote: CRM for notification. See Telephone encounter for: 02/05/18. Pt has been taking the LORazepam (ATIVAN) more than prescribed. She has been taking it 3-4xs a day instead of twice. She is wanting to see if the prescription can be changed to this.

## 2018-02-05 NOTE — Telephone Encounter (Signed)
Would you like for patient to set up an appointment to come and talk about this?

## 2018-02-06 DIAGNOSIS — M79605 Pain in left leg: Secondary | ICD-10-CM | POA: Diagnosis not present

## 2018-02-06 DIAGNOSIS — M79604 Pain in right leg: Secondary | ICD-10-CM | POA: Diagnosis not present

## 2018-02-06 DIAGNOSIS — M5416 Radiculopathy, lumbar region: Secondary | ICD-10-CM | POA: Diagnosis not present

## 2018-02-06 NOTE — Telephone Encounter (Signed)
LVM to inform patient of note below.   She does need and appointment to have the dose changed. She can come in sooner or we will just speak with her about on 4/15.   Thank you.

## 2018-02-06 NOTE — Telephone Encounter (Signed)
Pt has an appt 04/15 if there is availability earlier she an come in earlier but she needs to be seen

## 2018-02-12 ENCOUNTER — Other Ambulatory Visit: Payer: Self-pay | Admitting: Internal Medicine

## 2018-02-12 ENCOUNTER — Encounter: Payer: Self-pay | Admitting: Internal Medicine

## 2018-02-13 ENCOUNTER — Encounter: Payer: Self-pay | Admitting: Internal Medicine

## 2018-02-13 NOTE — Telephone Encounter (Signed)
Pt made appt for 02/26/18...[name]/lmb

## 2018-02-26 ENCOUNTER — Ambulatory Visit: Payer: Medicare Other | Admitting: Internal Medicine

## 2018-02-26 DIAGNOSIS — Z0289 Encounter for other administrative examinations: Secondary | ICD-10-CM

## 2018-02-28 ENCOUNTER — Other Ambulatory Visit: Payer: Self-pay | Admitting: Internal Medicine

## 2018-03-09 ENCOUNTER — Ambulatory Visit: Payer: Medicare Other | Admitting: Internal Medicine

## 2018-03-14 DIAGNOSIS — M5412 Radiculopathy, cervical region: Secondary | ICD-10-CM | POA: Diagnosis not present

## 2018-03-14 DIAGNOSIS — M5136 Other intervertebral disc degeneration, lumbar region: Secondary | ICD-10-CM | POA: Diagnosis not present

## 2018-03-14 DIAGNOSIS — M722 Plantar fascial fibromatosis: Secondary | ICD-10-CM | POA: Diagnosis not present

## 2018-03-14 DIAGNOSIS — M5416 Radiculopathy, lumbar region: Secondary | ICD-10-CM | POA: Diagnosis not present

## 2018-03-14 DIAGNOSIS — G8929 Other chronic pain: Secondary | ICD-10-CM | POA: Diagnosis not present

## 2018-03-15 ENCOUNTER — Telehealth: Payer: Self-pay | Admitting: Internal Medicine

## 2018-03-15 NOTE — Telephone Encounter (Signed)
I think Pain Clinic will figure it all out Thx

## 2018-03-15 NOTE — Telephone Encounter (Signed)
Copied from CRM [id number]. Topic: Quick Communication - See Telephone Encounter >> Mar 15, 2018 12:15 PM Cipriano Bunker wrote: CRM for notification. See Telephone encounter for: 03/15/18.  Pt. Called  goes to pain clinic  she is on valium - pain clinic wants to change medication. He is asking if can change the  oxyCODONE-acetaminophen (PERCOCET) 10-325 MG tablet as the medication he is wanting to put her on will not go with it. She is not sure of name of medication.   Arlyn Leak with [facility]  [phone]

## 2018-03-16 DIAGNOSIS — Z1231 Encounter for screening mammogram for malignant neoplasm of breast: Secondary | ICD-10-CM | POA: Diagnosis not present

## 2018-03-20 ENCOUNTER — Ambulatory Visit: Payer: Medicare Other | Admitting: Internal Medicine

## 2018-04-16 DIAGNOSIS — M722 Plantar fascial fibromatosis: Secondary | ICD-10-CM | POA: Diagnosis not present

## 2018-04-16 DIAGNOSIS — M5136 Other intervertebral disc degeneration, lumbar region: Secondary | ICD-10-CM | POA: Diagnosis not present

## 2018-04-16 DIAGNOSIS — M5416 Radiculopathy, lumbar region: Secondary | ICD-10-CM | POA: Diagnosis not present

## 2018-04-16 DIAGNOSIS — M5412 Radiculopathy, cervical region: Secondary | ICD-10-CM | POA: Diagnosis not present

## 2018-04-16 DIAGNOSIS — G8929 Other chronic pain: Secondary | ICD-10-CM | POA: Diagnosis not present

## 2018-04-17 ENCOUNTER — Other Ambulatory Visit: Payer: Self-pay | Admitting: Internal Medicine

## 2018-04-19 ENCOUNTER — Ambulatory Visit: Payer: Medicare Other | Admitting: Internal Medicine

## 2018-05-14 ENCOUNTER — Other Ambulatory Visit: Payer: Self-pay | Admitting: Internal Medicine

## 2018-05-18 NOTE — Telephone Encounter (Signed)
Patient calling to check on the medication refill for LORazepam (ATIVAN) 1 MG tablet [facility] - [place] - [address] Please advise.

## 2018-06-01 ENCOUNTER — Other Ambulatory Visit: Payer: Self-pay | Admitting: Internal Medicine

## 2018-06-08 ENCOUNTER — Telehealth: Payer: Self-pay | Admitting: Internal Medicine

## 2018-06-08 DIAGNOSIS — Z79899 Other long term (current) drug therapy: Secondary | ICD-10-CM | POA: Diagnosis not present

## 2018-06-08 DIAGNOSIS — M5412 Radiculopathy, cervical region: Secondary | ICD-10-CM | POA: Diagnosis not present

## 2018-06-08 DIAGNOSIS — M5136 Other intervertebral disc degeneration, lumbar region: Secondary | ICD-10-CM | POA: Diagnosis not present

## 2018-06-08 DIAGNOSIS — M722 Plantar fascial fibromatosis: Secondary | ICD-10-CM | POA: Diagnosis not present

## 2018-06-08 DIAGNOSIS — Z5181 Encounter for therapeutic drug level monitoring: Secondary | ICD-10-CM | POA: Diagnosis not present

## 2018-06-08 DIAGNOSIS — M5416 Radiculopathy, lumbar region: Secondary | ICD-10-CM | POA: Diagnosis not present

## 2018-06-08 NOTE — Telephone Encounter (Signed)
Copied from CRM [id number]. Topic: Quick Communication - See Telephone Encounter >> Jun 08, 2018 11:54 AM Lorrine KinMcGee, Bevan Vu B, NT wrote: CRM for notification. See Telephone encounter for: 06/08/18. Patient calling and states that she was seen at her pain management appointment this morning and states that her doctor there told her that the pain medication that he prescribes and the ativan that Dr Posey ReaPlotnikov prescribes counteract with each other. Please advise. CB#: [phone]

## 2018-06-08 NOTE — Telephone Encounter (Signed)
FYI

## 2018-06-12 NOTE — Telephone Encounter (Signed)
OK. I'll stop prescribing Ativan. Pls ask the pt to wean herself off of it over a period of 1-2 weeks. Thx

## 2018-06-12 NOTE — Telephone Encounter (Signed)
Pt notified and voiced understanding 

## 2018-06-20 ENCOUNTER — Ambulatory Visit: Payer: Medicare Other | Admitting: Internal Medicine

## 2018-07-09 ENCOUNTER — Ambulatory Visit: Payer: Medicare Other | Admitting: Primary Care

## 2018-07-09 ENCOUNTER — Ambulatory Visit: Payer: Medicare Other | Admitting: Internal Medicine

## 2018-07-09 ENCOUNTER — Ambulatory Visit: Payer: Medicare Other | Admitting: Pulmonary Disease

## 2018-07-11 ENCOUNTER — Other Ambulatory Visit: Payer: Self-pay | Admitting: Internal Medicine

## 2018-07-18 ENCOUNTER — Encounter

## 2018-07-18 ENCOUNTER — Encounter: Payer: Self-pay | Admitting: Primary Care

## 2018-07-18 ENCOUNTER — Ambulatory Visit (INDEPENDENT_AMBULATORY_CARE_PROVIDER_SITE_OTHER): Payer: Medicare Other | Admitting: Primary Care

## 2018-07-18 ENCOUNTER — Encounter: Payer: Self-pay | Admitting: Internal Medicine

## 2018-07-18 ENCOUNTER — Ambulatory Visit (INDEPENDENT_AMBULATORY_CARE_PROVIDER_SITE_OTHER): Payer: Medicare Other | Admitting: Internal Medicine

## 2018-07-18 DIAGNOSIS — Z9989 Dependence on other enabling machines and devices: Secondary | ICD-10-CM

## 2018-07-18 DIAGNOSIS — R5382 Chronic fatigue, unspecified: Secondary | ICD-10-CM | POA: Diagnosis not present

## 2018-07-18 DIAGNOSIS — R410 Disorientation, unspecified: Secondary | ICD-10-CM | POA: Diagnosis not present

## 2018-07-18 DIAGNOSIS — F419 Anxiety disorder, unspecified: Secondary | ICD-10-CM | POA: Diagnosis not present

## 2018-07-18 DIAGNOSIS — F4323 Adjustment disorder with mixed anxiety and depressed mood: Secondary | ICD-10-CM

## 2018-07-18 DIAGNOSIS — G4733 Obstructive sleep apnea (adult) (pediatric): Secondary | ICD-10-CM | POA: Diagnosis not present

## 2018-07-18 DIAGNOSIS — M542 Cervicalgia: Secondary | ICD-10-CM | POA: Diagnosis not present

## 2018-07-18 DIAGNOSIS — G8929 Other chronic pain: Secondary | ICD-10-CM

## 2018-07-18 DIAGNOSIS — R413 Other amnesia: Secondary | ICD-10-CM | POA: Diagnosis not present

## 2018-07-18 MED ORDER — DONEPEZIL HCL 10 MG PO TABS: 10.0000 mg | ORAL_TABLET | Freq: Every day | ORAL | 3 refills | Status: DC

## 2018-07-18 MED ORDER — LACTULOSE 10 GM/15ML PO SOLN: 20.0000 g | Freq: Two times a day (BID) | ORAL | 5 refills | Status: AC | PRN

## 2018-07-18 MED ORDER — ALPRAZOLAM 1 MG PO TABS: 0.5000 mg | ORAL_TABLET | Freq: Two times a day (BID) | ORAL | 3 refills | Status: DC | PRN

## 2018-07-18 NOTE — Assessment & Plan Note (Signed)
Potential benefits of a long term benzodiazepines  use as well as potential risks  and complications were explained to the patient and were aknowledged. Cymbalta, Lexapro Lorazepam

## 2018-07-18 NOTE — Assessment & Plan Note (Signed)
On Cymbalta 

## 2018-07-18 NOTE — Progress Notes (Signed)
@Patient  ID: Samantha [name], female    DOB: 1966-10-18, 52 y.o.   MRN: [id number]  Chief Complaint  Patient presents with  . Follow-up    cpap-92month    Referring provider: Plotnikov, Georgina Quint, MD  HPI: 52 year old female, never smoked. PMH OSA on CPAP. Patient of Dr. Maple Hudson, last seen 03/17/18. CPAP nasal mask, auto setting 5-15cm H20 with advanced DME. Uses melatonin along with Cymbalta for sleep.   NPSG 03/08/15 at [facility] with full seizure montage- AHI 12.1/ hr, desaturation to 91%, no seizure activity  07/18/2018 Patient presents today for 1 year follow-up visit. Doing well, feels a lot better with cpap use. States that she can sleep 12 hours at night and still need nap. Wears nasal mask when she takes naps. Still feels really lethargic during the day, has trouble getting out of bed in the morning and getting dressed. Denies falling asleep during conversation, driving or eating. No narcolepsy symptoms. She is on oxycodone three times a day for chronic pain related to OA. She was previously taking ativan twice a day, recently stopped under medial supervision. Doesn't think Cymbalta is working. Still feels anxious. She does lack motivation. Denies SI.   Airview download reviewed:  99% compliance >4 hours.  95th precentile 9.8 cm H20 AHI 1.5    Allergies  Allergen Reactions  . Citalopram Hydrobromide     REACTION: low libido    Immunization History  Administered Date(s) Administered  . Influenza Whole 09/06/2012  . Influenza,inj,Quad PF,6+ Mos 08/08/2013, 07/07/2014, 08/27/2015, 08/22/2016  . Pneumococcal Conjugate-13 12/26/2016  . Tdap 01/23/2015    Past Medical History:  Diagnosis Date  . Anemia   . Anxiety   . Cerebral ventricular shunt fitting or adjustment   . COMMON MIGRAINE 10/01/2007   Chronic    . Depression   . GERD (gastroesophageal reflux disease)   . GLUCOSE INTOLERANCE 10/01/2007   Qualifier: Diagnosis of  By: Jonny Ruiz MD, Len Blalock   . Hiatal  hernia   . Hydrocephalus   . Hyperlipidemia   . HYPERLIPIDEMIA 10/01/2007   Chronic. Lovaza is too $$$ Declined statins due to worries   . IDA (iron deficiency anemia)   . Memory loss   . Migraine   . OBSTRUCTIVE SLEEP APNEA 10/01/2007   NPSG 2006:  AHI 9/hr Started cpap 2009 successfully.     . Paresthesia   . Schatzki's ring   . Sleep apnea   . Stroke [facility]) 03/2013    Tobacco History: Social History   Tobacco Use  Smoking Status Never Smoker  Smokeless Tobacco Never Used   Counseling given: Not Answered   Outpatient Medications Prior to Visit  Medication Sig Dispense Refill  . aspirin 81 MG tablet Take 81 mg by mouth daily.    Marland Kitchen atorvastatin (LIPITOR) 20 MG tablet Take 1 tablet (20 mg total) by mouth daily at 6 PM. Overdue for annual appt w/labs must see provider for future refills 30 tablet 0  . Calcium Carbonate-Vitamin D (CALCIUM-VITAMIN D) 500-200 MG-UNIT per tablet Take 1 tablet by mouth 2 (two) times daily with a meal.    . Docusate Calcium (STOOL SOFTENER PO) Take by mouth 2 (two) times daily.    Marland Kitchen donepezil (ARICEPT) 5 MG tablet TAKE ONE TABLET BY MOUTH AT BEDTIME 90 tablet 3  . DULoxetine (CYMBALTA) 60 MG capsule TAKE ONE CAPSULE BY MOUTH DAILY 90 capsule 1  . lamoTRIgine (LAMICTAL) 100 MG tablet TAKE 1 TABLET BY MOUTH IN THE MORNING,  AND TAKE 1 AND ONE HALF TABLETS AT NIGHT. Patient needs office visit before refills will be given 240 tablet 0  . lidocaine (LIDODERM) 5 % Place 1 patch onto the skin daily. Remove & Discard patch within 12 hours or as directed by MD 60 patch 0  . meloxicam (MOBIC) 15 MG tablet TAKE 1/2 TO 1 TABLET BY MOUTH DAILY AS NEEDED FOR PAIN 30 tablet 3  . oxyCODONE-acetaminophen (PERCOCET) 10-325 MG tablet Take 1 tablet by mouth every 6 (six) hours as needed for pain. 30 tablet 0  . ranitidine (ZANTAC) 150 MG tablet TAKE 1 TABLET BY MOUTH TWICE A DAY X 2 WEEKS,THEN ONCE DAILY 60 tablet 0  . LORazepam (ATIVAN) 1 MG tablet TAKE ONE (1) TABLET BY  MOUTH TWO (2) TIMES DAILY AS NEEDED 180 tablet 1  . polyethylene glycol powder (GLYCOLAX/MIRALAX) powder Take 17 g by mouth 2 (two) times daily as needed. 500 g 5  . baclofen (LIORESAL) 10 MG tablet Take 0.5-1 tablets (5-10 mg total) by mouth 2 (two) times daily as needed for muscle spasms. (Patient not taking: Reported on 07/18/2018) 60 each 1  . cyclobenzaprine (FLEXERIL) 5 MG tablet Take 1 tablet (5 mg total) by mouth 3 (three) times daily as needed for muscle spasms. (Patient not taking: Reported on 07/18/2018) 30 tablet 1  . linaclotide (LINZESS) 290 MCG CAPS capsule Take 1 capsule (290 mcg total) by mouth daily before breakfast. Must keep appointment on 07/18/18 (Patient not taking: Reported on 07/18/2018) 30 capsule 0  . SUMAtriptan (IMITREX) 100 MG tablet Take 1 tablet (100 mg total) by mouth daily as needed. (Patient not taking: Reported on 07/18/2018) 10 tablet 11  . Vilazodone HCl (VIIBRYD) 20 MG TABS Take 1 tablet (20 mg total) by mouth daily. (Patient not taking: Reported on 07/18/2018) 30 tablet 6   No facility-administered medications prior to visit.     Review of Systems  Review of Systems  Constitutional: Positive for fatigue. Negative for appetite change, chills, diaphoresis, fever and unexpected weight change.       Daytime fatigue   HENT: Negative.   Cardiovascular: Negative.   Neurological: Negative.   Psychiatric/Behavioral: Negative for agitation, behavioral problems, confusion, self-injury, sleep disturbance and suicidal ideas. The patient is nervous/anxious. The patient is not hyperactive.     Physical Exam  BP 140/88 (BP Location: Left Arm, Cuff Size: Normal)   Pulse 78   Ht 4\' 10"  (1.473 m)   Wt 144 lb 6.4 oz (65.5 kg)   SpO2 94%   BMI 30.18 kg/m  Physical Exam  Constitutional: She is oriented to person, place, and time. She appears well-developed and well-nourished.  HENT:  Head: Normocephalic and atraumatic.  Eyes: Pupils are equal, round, and reactive to light.  EOM are normal.  Neck: Normal range of motion. Neck supple.  Cardiovascular: Normal rate and regular rhythm.  No murmur heard. Pulmonary/Chest: Effort normal. No respiratory distress.  Abdominal: Soft. Bowel sounds are normal. There is no tenderness.  Neurological: She is alert and oriented to person, place, and time.  Skin: Skin is warm and dry. No rash noted. No erythema.  Psychiatric: She has a normal mood and affect. Her behavior is normal. Judgment normal.     Lab Results:  CBC    Component Value Date/Time   WBC 8.2 12/26/2016 1632   RBC 4.26 12/26/2016 1632   HGB 12.1 12/26/2016 1632   HCT 37.0 12/26/2016 1632   PLT 564.0 (H) 12/26/2016 1632   MCV 86.9  12/26/2016 1632   MCH 32.0 07/12/2015 0230   MCHC 32.8 12/26/2016 1632   RDW 15.9 (H) 12/26/2016 1632   LYMPHSABS 2.7 12/26/2016 1632   MONOABS 0.8 12/26/2016 1632   EOSABS 0.1 12/26/2016 1632   BASOSABS 0.1 12/26/2016 1632    BMET    Component Value Date/Time   NA 136 12/26/2016 1632   K 4.3 12/26/2016 1632   CL 104 12/26/2016 1632   CO2 25 12/26/2016 1632   GLUCOSE 102 (H) 12/26/2016 1632   GLUCOSE 108 (H) 11/22/2006 0728   BUN 12 12/26/2016 1632   CREATININE 0.90 12/26/2016 1632   CALCIUM 9.7 12/26/2016 1632   GFRNONAA >60 07/12/2015 0230   GFRAA >60 07/12/2015 0230    BNP No results found for: BNP  ProBNP No results found for: PROBNP  Imaging: No results found.   Assessment & Plan:   OSA on CPAP Stable; excellent compliance -Airview downloaded showed 99% compliance with CPAP >4 hours. AHI 1.5, minimal air leaks  Continues to have some ongoing daytime fatigue and lethargy despite compliance with cpap. I believe this is mostly related to her chronic pain and some underlying anxiety/depression. She is meeting with her PCP later today to discuss medications. She could likely benefit from behavorial health/counseling. Recommend establishing a morning routine and increasing physical activity. If above  is not effective would recommend patient have a discussion with Dr. Maple Hudson about medication to help with daytime somnolence such as Modafinil.    Fatigue Likely related to chronic pain and underlying anxiety/depression She expresses some lack in motivation  Needs to discuss medication management with PCP Recommend patient establish a daily routine, getting out of bed at the same time every day, increasing physical activity      Samantha Bayley, NP 07/18/2018

## 2018-07-18 NOTE — Patient Instructions (Signed)
Download of CPAP looked good. Great job on wearing your mask, keep it up!  Talk with PCP about referral for counseling  FU in 1 year with Dr. Maple Hudson

## 2018-07-18 NOTE — Assessment & Plan Note (Signed)
She will discuss CBD oil w/her pain doctors

## 2018-07-18 NOTE — Assessment & Plan Note (Signed)
On Aricept - increase to 10 mg/d H/o Alzheimer's in the family

## 2018-07-18 NOTE — Assessment & Plan Note (Signed)
Discussed On Aricept - increase to 10 mg/d H/o Alzheimer's in the family

## 2018-07-18 NOTE — Assessment & Plan Note (Addendum)
Stable; excellent compliance -Airview downloaded showed 99% compliance with CPAP >4 hours. AHI 1.5, minimal air leaks  Continues to have some ongoing daytime fatigue and lethargy despite compliance with cpap. I believe this is mostly related to her chronic pain and some underlying anxiety/depression. She is meeting with her PCP later today to discuss medications. She could likely benefit from behavorial health/counseling. Recommend establishing a morning routine and increasing physical activity. If above is not effective would recommend patient have a discussion with Dr. Maple Hudson about medication to help with daytime somnolence such as Modafinil.

## 2018-07-18 NOTE — Assessment & Plan Note (Signed)
CFS - discussed 

## 2018-07-18 NOTE — Assessment & Plan Note (Signed)
Likely related to chronic pain and underlying anxiety/depression She expresses some lack in motivation  Needs to discuss medication management with PCP Recommend patient establish a daily routine, getting out of bed at the same time every day, increasing physical activity

## 2018-07-18 NOTE — Progress Notes (Signed)
Subjective:  Patient ID: Samantha [name], female    DOB: Dec 14, 1965  Age: 52 y.o. MRN: [id number]  CC: No chief complaint on file.   HPI Samantha [name] presents for chronic pain - bad, depression - worse, anxiety f/u. C/o apathy - staying in bed a lot  Outpatient Medications Prior to Visit  Medication Sig Dispense Refill  . aspirin 81 MG tablet Take 81 mg by mouth daily.    Marland Kitchen atorvastatin (LIPITOR) 20 MG tablet Take 1 tablet (20 mg total) by mouth daily at 6 PM. Overdue for annual appt w/labs must see provider for future refills 30 tablet 0  . Calcium Carbonate-Vitamin D (CALCIUM-VITAMIN D) 500-200 MG-UNIT per tablet Take 1 tablet by mouth 2 (two) times daily with a meal.    . Docusate Calcium (STOOL SOFTENER PO) Take by mouth 2 (two) times daily.    Marland Kitchen donepezil (ARICEPT) 5 MG tablet TAKE ONE TABLET BY MOUTH AT BEDTIME 90 tablet 3  . DULoxetine (CYMBALTA) 60 MG capsule TAKE ONE CAPSULE BY MOUTH DAILY 90 capsule 1  . lamoTRIgine (LAMICTAL) 100 MG tablet TAKE 1 TABLET BY MOUTH IN THE MORNING, AND TAKE 1 AND ONE HALF TABLETS AT NIGHT. Patient needs office visit before refills will be given 240 tablet 0  . lidocaine (LIDODERM) 5 % Place 1 patch onto the skin daily. Remove & Discard patch within 12 hours or as directed by MD 60 patch 0  . meloxicam (MOBIC) 15 MG tablet TAKE 1/2 TO 1 TABLET BY MOUTH DAILY AS NEEDED FOR PAIN 30 tablet 3  . oxyCODONE-acetaminophen (PERCOCET) 10-325 MG tablet Take 1 tablet by mouth every 6 (six) hours as needed for pain. 30 tablet 0  . ranitidine (ZANTAC) 150 MG tablet TAKE 1 TABLET BY MOUTH TWICE A DAY X 2 WEEKS,THEN ONCE DAILY 60 tablet 0  . baclofen (LIORESAL) 10 MG tablet Take 0.5-1 tablets (5-10 mg total) by mouth 2 (two) times daily as needed for muscle spasms. (Patient not taking: Reported on 07/18/2018) 60 each 1  . cyclobenzaprine (FLEXERIL) 5 MG tablet Take 1 tablet (5 mg total) by mouth 3 (three) times daily as needed for muscle spasms. (Patient not taking:  Reported on 07/18/2018) 30 tablet 1  . linaclotide (LINZESS) 290 MCG CAPS capsule Take 1 capsule (290 mcg total) by mouth daily before breakfast. Must keep appointment on 07/18/18 (Patient not taking: Reported on 07/18/2018) 30 capsule 0  . LORazepam (ATIVAN) 1 MG tablet TAKE ONE (1) TABLET BY MOUTH TWO (2) TIMES DAILY AS NEEDED 180 tablet 1  . polyethylene glycol powder (GLYCOLAX/MIRALAX) powder Take 17 g by mouth 2 (two) times daily as needed. 500 g 5  . SUMAtriptan (IMITREX) 100 MG tablet Take 1 tablet (100 mg total) by mouth daily as needed. (Patient not taking: Reported on 07/18/2018) 10 tablet 11  . Vilazodone HCl (VIIBRYD) 20 MG TABS Take 1 tablet (20 mg total) by mouth daily. (Patient not taking: Reported on 07/18/2018) 30 tablet 6   No facility-administered medications prior to visit.     ROS: Review of Systems  Constitutional: Positive for fatigue. Negative for activity change, appetite change, chills and unexpected weight change.  HENT: Negative for congestion, mouth sores and sinus pressure.   Eyes: Negative for visual disturbance.  Respiratory: Negative for cough and chest tightness.   Gastrointestinal: Negative for abdominal pain and nausea.  Genitourinary: Negative for difficulty urinating, frequency and vaginal pain.  Musculoskeletal: Positive for arthralgias and back pain. Negative for gait problem.  Skin: Negative for pallor and rash.  Neurological: Negative for dizziness, tremors, weakness, numbness and headaches.  Psychiatric/Behavioral: Positive for confusion, decreased concentration and sleep disturbance. Negative for suicidal ideas. The patient is nervous/anxious.     Objective:  BP 138/82 (BP Location: Left Arm, Patient Position: Sitting, Cuff Size: Normal)   Pulse 67   Temp 98.3 F (36.8 C) (Oral)   Ht 4\' 10"  (1.473 m)   Wt 144 lb (65.3 kg)   SpO2 97%   BMI 30.10 kg/m   BP Readings from Last 3 Encounters:  07/18/18 138/82  07/18/18 140/88  07/04/17 132/90     Wt Readings from Last 3 Encounters:  07/18/18 144 lb (65.3 kg)  07/18/18 144 lb 6.4 oz (65.5 kg)  05/22/17 140 lb (63.5 kg)    Physical Exam  Constitutional: She appears well-developed. No distress.  HENT:  Head: Normocephalic.  Right Ear: External ear normal.  Left Ear: External ear normal.  Nose: Nose normal.  Mouth/Throat: Oropharynx is clear and moist.  Eyes: Pupils are equal, round, and reactive to light. Conjunctivae are normal. Right eye exhibits no discharge. Left eye exhibits no discharge.  Neck: Normal range of motion. Neck supple. No JVD present. No tracheal deviation present. No thyromegaly present.  Cardiovascular: Normal rate, regular rhythm and normal heart sounds.  Pulmonary/Chest: No stridor. No respiratory distress. She has no wheezes.  Abdominal: Soft. Bowel sounds are normal. She exhibits no distension and no mass. There is no tenderness. There is no rebound and no guarding.  Musculoskeletal: She exhibits tenderness. She exhibits no edema.  Lymphadenopathy:    She has no cervical adenopathy.  Neurological: She displays normal reflexes. No cranial nerve deficit. She exhibits normal muscle tone. Coordination abnormal.  Skin: No rash noted. No erythema.  Psychiatric: She has a normal mood and affect. Her behavior is normal. Judgment and thought content normal.   LS tender, feet tender Cane  Lab Results  Component Value Date   WBC 8.2 12/26/2016   HGB 12.1 12/26/2016   HCT 37.0 12/26/2016   PLT 564.0 (H) 12/26/2016   GLUCOSE 102 (H) 12/26/2016   CHOL 205 (H) 12/26/2016   TRIG 102.0 12/26/2016   HDL 52.40 12/26/2016   LDLDIRECT 207.7 05/02/2011   LDLCALC 133 (H) 12/26/2016   ALT 13 12/26/2016   AST 17 12/26/2016   NA 136 12/26/2016   K 4.3 12/26/2016   CL 104 12/26/2016   CREATININE 0.90 12/26/2016   BUN 12 12/26/2016   CO2 25 12/26/2016   TSH 2.66 12/26/2016   HGBA1C 5.8 08/29/2014    Dg Skull 1-3 Views  Result Date: 10/21/2015 CLINICAL  DATA:  Memory loss.  Evaluate shunt. EXAM: SKULL - 1-3 VIEW COMPARISON:  None. FINDINGS: Visualized shunt catheter in the right neck, entering in the right posterior occipital region. It is difficult to visualize the portion of the shunt overlying the posterior calvarium. Remainder the shunt appears intact. IMPRESSION: Right shunt catheter as above. Electronically Signed   By: Charlett Nose M.D.   On: 10/21/2015 13:30   Dg Chest 1 View  Result Date: 10/21/2015 CLINICAL DATA:  Assess status of a ventriculoperitoneal shunt placed 40 years ago; patient reports memory loss and intractable headache. EXAM: CHEST 1 VIEW COMPARISON:  PA and lateral chest x-ray of July 12, 2015 FINDINGS: The ventriculoperitoneal shunt tube is visible extending from the base of the neck to the lower aspect of the right hemi- thorax but it is difficult to follow it more inferiorly until  approximately T11 where it becomes visible again. Visualization is hampered by the prominent dextrocurvature of the lower thoracic spine. Without a lateral view it is difficult to comment further on the shunt. The lungs are clear. The heart is top-normal in size. The pulmonary vascularity is normal. There is moderate S shaped thoracolumbar scoliosis. IMPRESSION: The ventriculoperitoneal shunt tube appears intact down to approximately T8 where it is lost due to overlying bony structures. It is again demonstrated at approximately T11. One cannot exclude discontinuity of the lower thoracic portion of the shunt. Electronically Signed   By: David  [place] M.D.   On: 10/21/2015 13:31   Dg Abd 1 View  Result Date: 10/21/2015 CLINICAL DATA:  Evaluate shunt. EXAM: ABDOMEN - 1 VIEW COMPARISON:  None. FINDINGS: The shunt tip is in the right upper quadrant. The bowel gas pattern is unremarkable. The soft tissue shadows are maintained. No worrisome calcifications. The bony structures are intact. IMPRESSION: Shunt tip in the right upper quadrant. Unremarkable  abdominal radiograph. Electronically Signed   By: Rudie Meyer M.D.   On: 10/21/2015 13:30   Ct Head Wo Contrast  Result Date: 10/21/2015 CLINICAL DATA:  52 year old female with chronic shunt since the 1970s. Recurrent headaches. Initial encounter. EXAM: CT HEAD WITHOUT CONTRAST TECHNIQUE: Contiguous axial images were obtained from the base of the skull through the vertex without intravenous contrast. COMPARISON:  Brain MRI 07/12/2015 and earlier. FINDINGS: Sequelae of right frontotemporal craniotomy. There is an intracranial surgical clip at the central skullbase just above the posterior clinoid. This was seen to be near the basilar artery tip on the recent MRI. There is a E right posterior convexity approach ventriculostomy shunt. No acute osseous abnormality identified. Visualized paranasal sinuses and mastoids are clear. Negative visualized orbits soft tissues. Chronic scalp soft tissue scarring. Posterior right scalp shunt reservoir with tubing which descends in the posterior right neck soft tissues. Chronic ventriculomegaly appears stable since August. The current shunt catheter courses to the right temporal horn and appears stable. Chronic encephalomalacia overlying the right frontal horn. No CT evidence of transependymal edema or acute cortically based infarct. No acute intracranial hemorrhage identified. No midline shift or acute intracranial mass effect. IMPRESSION: 1. Stable appearance of the brain since the MRI in August. No transependymal edema. 2. Chronic ventriculomegaly and right frontal horn region encephalomalacia. 3. Chronic postoperative changes including prior right frontotemporal craniotomy, and chronic surgical clip at the central skullbase near the basilar artery. Electronically Signed   By: Odessa Fleming M.D.   On: 10/21/2015 15:59    Assessment & Plan:   There are no diagnoses linked to this encounter.   No orders of the defined types were placed in this encounter.    Follow-up:  No follow-ups on file.  Sonda Primes, MD

## 2018-07-18 NOTE — Assessment & Plan Note (Signed)
On CPAP. ?

## 2018-07-19 ENCOUNTER — Telehealth: Payer: Self-pay | Admitting: Internal Medicine

## 2018-07-19 MED ORDER — ATORVASTATIN CALCIUM 20 MG PO TABS: 20.0000 mg | ORAL_TABLET | Freq: Every day | ORAL | 2 refills | Status: DC

## 2018-07-19 NOTE — Telephone Encounter (Signed)
RX sent

## 2018-07-19 NOTE — Telephone Encounter (Signed)
Copied from CRM [id number]. Topic: Quick Communication - See Telephone Encounter >> Jul 19, 2018  3:09 PM Terisa Starr wrote: CRM for notification. See Telephone encounter for: 07/19/18.  Patient said she is out of refills on her atorvastatin (LIPITOR) 20 MG tablet. She said she needs a new prescription sent to [facility] at [facility] - [place], [place] - [address] [place] [address] to have on file. She saw Dr Posey Rea yesterday and forgot to tell him. She has about 15 pills left.

## 2018-08-21 ENCOUNTER — Ambulatory Visit: Payer: Medicare Other | Admitting: Internal Medicine

## 2018-08-22 ENCOUNTER — Ambulatory Visit: Payer: Medicare Other | Admitting: Internal Medicine

## 2018-08-24 ENCOUNTER — Telehealth: Payer: Self-pay | Admitting: Internal Medicine

## 2018-08-24 DIAGNOSIS — Z79899 Other long term (current) drug therapy: Secondary | ICD-10-CM | POA: Diagnosis not present

## 2018-08-24 DIAGNOSIS — Z5181 Encounter for therapeutic drug level monitoring: Secondary | ICD-10-CM | POA: Diagnosis not present

## 2018-08-24 NOTE — Telephone Encounter (Signed)
Copied from CRM [id number]. Topic: Quick Communication - See Telephone Encounter >> Aug 24, 2018 10:51 AM Jolayne Haines L wrote: CRM for notification. See Telephone encounter for: 08/24/18.  Patient states she " hates " the ALPRAZolam (XANAX) 1 MG tablet. It is not helping her with anxiety and making her cry all the time. Please advise. Call back @ [phone]

## 2018-08-24 NOTE — Telephone Encounter (Signed)
Pt was supposed to be seen at beginning of the month, pt needs OV

## 2018-08-24 NOTE — Telephone Encounter (Signed)
Yes, but he has no appointment until Nov. Can we use a same day?

## 2018-08-24 NOTE — Telephone Encounter (Signed)
Same day can be used

## 2018-08-24 NOTE — Telephone Encounter (Signed)
LVM to inform patient she was due for a FU at the start of OCT.   If patient returns calls she can have a same day spot. Just let us know which one so we can have it put in.

## 2018-08-28 DIAGNOSIS — M47816 Spondylosis without myelopathy or radiculopathy, lumbar region: Secondary | ICD-10-CM | POA: Diagnosis not present

## 2018-08-28 DIAGNOSIS — M5136 Other intervertebral disc degeneration, lumbar region: Secondary | ICD-10-CM | POA: Diagnosis not present

## 2018-08-28 DIAGNOSIS — M5412 Radiculopathy, cervical region: Secondary | ICD-10-CM | POA: Diagnosis not present

## 2018-08-28 DIAGNOSIS — M792 Neuralgia and neuritis, unspecified: Secondary | ICD-10-CM | POA: Diagnosis not present

## 2018-08-28 NOTE — Telephone Encounter (Signed)
Patient would like a call back from cma to discuss the xanax because Dr. Posey Rea is out of the office until next week.

## 2018-08-28 NOTE — Telephone Encounter (Signed)
Pt states she has been taking 1/2 tab in the am and 1/2 at night, and she has felt much better.  Not crying anymore.  Pt states she is going to continue on this regimen until she can see Dr Posey Rea. Pt states she does not drive and is going to have to get with her sister to see when she can bring her. Pt will call back and advised someone will go over the times Dr can see her, and they should then let [place] know.

## 2018-08-28 NOTE — Telephone Encounter (Signed)
noted 

## 2018-08-29 ENCOUNTER — Other Ambulatory Visit: Payer: Self-pay

## 2018-08-29 ENCOUNTER — Encounter (HOSPITAL_COMMUNITY): Payer: Self-pay | Admitting: *Deleted

## 2018-08-29 ENCOUNTER — Ambulatory Visit: Payer: Self-pay | Admitting: *Deleted

## 2018-08-29 ENCOUNTER — Emergency Department (HOSPITAL_COMMUNITY)
Admission: EM | Admit: 2018-08-29 | Discharge: 2018-08-30 | Disposition: A | Discharge status: Home / Self Care | Payer: Medicare Other | Attending: Emergency Medicine | Admitting: Emergency Medicine

## 2018-08-29 ENCOUNTER — Emergency Department (HOSPITAL_COMMUNITY): Payer: Medicare Other

## 2018-08-29 DIAGNOSIS — K2100 Gastro-esophageal reflux disease with esophagitis, without bleeding: Secondary | ICD-10-CM

## 2018-08-29 DIAGNOSIS — F4323 Adjustment disorder with mixed anxiety and depressed mood: Secondary | ICD-10-CM | POA: Diagnosis present

## 2018-08-29 DIAGNOSIS — F419 Anxiety disorder, unspecified: Secondary | ICD-10-CM | POA: Insufficient documentation

## 2018-08-29 DIAGNOSIS — K219 Gastro-esophageal reflux disease without esophagitis: Secondary | ICD-10-CM | POA: Diagnosis not present

## 2018-08-29 DIAGNOSIS — R079 Chest pain, unspecified: Secondary | ICD-10-CM | POA: Diagnosis not present

## 2018-08-29 DIAGNOSIS — Z7982 Long term (current) use of aspirin: Secondary | ICD-10-CM | POA: Insufficient documentation

## 2018-08-29 DIAGNOSIS — R0602 Shortness of breath: Secondary | ICD-10-CM | POA: Diagnosis not present

## 2018-08-29 DIAGNOSIS — Z79899 Other long term (current) drug therapy: Secondary | ICD-10-CM

## 2018-08-29 DIAGNOSIS — R0789 Other chest pain: Secondary | ICD-10-CM | POA: Diagnosis not present

## 2018-08-29 LAB — I-STAT TROPONIN, ED
Troponin i, poc: 0 ng/mL (ref 0.00–0.08)
Troponin i, poc: 0 ng/mL (ref 0.00–0.08)

## 2018-08-29 LAB — CBC
HCT: 33.1 % — ABNORMAL LOW (ref 36.0–46.0)
Hemoglobin: 9.9 g/dL — ABNORMAL LOW (ref 12.0–15.0)
MCH: 25.8 pg — ABNORMAL LOW (ref 26.0–34.0)
MCHC: 29.9 g/dL — ABNORMAL LOW (ref 30.0–36.0)
MCV: 86.2 fL (ref 80.0–100.0)
Platelets: 488 10*3/uL — ABNORMAL HIGH (ref 150–400)
RBC: 3.84 MIL/uL — ABNORMAL LOW (ref 3.87–5.11)
RDW: 15.8 % — ABNORMAL HIGH (ref 11.5–15.5)
WBC: 8.3 10*3/uL (ref 4.0–10.5)
nRBC: 0 % (ref 0.0–0.2)

## 2018-08-29 LAB — BASIC METABOLIC PANEL
Anion gap: 9 (ref 5–15)
BUN: 14 mg/dL (ref 6–20)
CO2: 24 mmol/L (ref 22–32)
Calcium: 9.4 mg/dL (ref 8.9–10.3)
Chloride: 108 mmol/L (ref 98–111)
Creatinine, Ser: 1.25 mg/dL — ABNORMAL HIGH (ref 0.44–1.00)
GFR calc Af Amer: 55 mL/min — ABNORMAL LOW (ref >60)
GFR calc non Af Amer: 48 mL/min — ABNORMAL LOW (ref >60)
Glucose, Bld: 100 mg/dL — ABNORMAL HIGH (ref 70–99)
Potassium: 4 mmol/L (ref 3.5–5.1)
Sodium: 141 mmol/L (ref 135–145)

## 2018-08-29 MED ORDER — GI COCKTAIL ~~LOC~~
30.0000 mL | Freq: Once | ORAL | Status: AC
  Administered 2018-08-29: 30 mL via ORAL
  Filled 2018-08-29: qty 30

## 2018-08-29 MED ORDER — LACTULOSE 10 GM/15ML PO SOLN: 20.0000 g | Freq: Two times a day (BID) | ORAL | Status: DC | PRN

## 2018-08-29 MED ORDER — OXYCODONE HCL 5 MG PO TABS
20.0000 mg | ORAL_TABLET | Freq: Four times a day (QID) | ORAL | Status: DC
  Administered 2018-08-29 – 2018-08-30 (×3): 20 mg via ORAL
  Filled 2018-08-29 (×3): qty 4

## 2018-08-29 MED ORDER — LAMOTRIGINE 150 MG PO TABS: 150.0000 mg | ORAL_TABLET | Freq: Every day | ORAL | Status: DC

## 2018-08-29 MED ORDER — FAMOTIDINE 20 MG PO TABS
20.0000 mg | ORAL_TABLET | Freq: Every day | ORAL | Status: DC
  Administered 2018-08-30: 20 mg via ORAL
  Filled 2018-08-29: qty 1

## 2018-08-29 MED ORDER — MELOXICAM 7.5 MG PO TABS
15.0000 mg | ORAL_TABLET | Freq: Every day | ORAL | Status: DC
  Administered 2018-08-30: 15 mg via ORAL
  Filled 2018-08-29 (×2): qty 2

## 2018-08-29 MED ORDER — DONEPEZIL HCL 10 MG PO TABS
10.0000 mg | ORAL_TABLET | Freq: Every day | ORAL | Status: DC
  Filled 2018-08-29: qty 1

## 2018-08-29 MED ORDER — DULOXETINE HCL 60 MG PO CPEP
60.0000 mg | ORAL_CAPSULE | Freq: Every day | ORAL | Status: DC
  Administered 2018-08-30: 60 mg via ORAL
  Filled 2018-08-29: qty 1

## 2018-08-29 MED ORDER — LAMOTRIGINE 100 MG PO TABS
100.0000 mg | ORAL_TABLET | ORAL | Status: DC
  Administered 2018-08-30: 100 mg via ORAL
  Filled 2018-08-29: qty 1

## 2018-08-29 MED ORDER — GABAPENTIN 300 MG PO CAPS
300.0000 mg | ORAL_CAPSULE | Freq: Three times a day (TID) | ORAL | Status: DC
  Administered 2018-08-29 – 2018-08-30 (×2): 300 mg via ORAL
  Filled 2018-08-29 (×2): qty 1

## 2018-08-29 MED ORDER — ATORVASTATIN CALCIUM 20 MG PO TABS: 20.0000 mg | ORAL_TABLET | Freq: Every day | ORAL | Status: DC

## 2018-08-29 MED ORDER — ASPIRIN EC 81 MG PO TBEC
81.0000 mg | DELAYED_RELEASE_TABLET | Freq: Every day | ORAL | Status: DC
  Administered 2018-08-30: 81 mg via ORAL
  Filled 2018-08-29: qty 1

## 2018-08-29 MED ORDER — DOCUSATE SODIUM 100 MG PO CAPS
100.0000 mg | ORAL_CAPSULE | Freq: Two times a day (BID) | ORAL | Status: DC
  Administered 2018-08-29 – 2018-08-30 (×2): 100 mg via ORAL
  Filled 2018-08-29 (×2): qty 1

## 2018-08-29 NOTE — ED Triage Notes (Signed)
Pt in with family with concerns about a new medication that was started last week, pt has been emotionally labile, also chest pain and feeling anxious, pt is taking xanax and is also being seen at a pain clinic and taking oxycodone

## 2018-08-29 NOTE — Telephone Encounter (Signed)
Pt at ED.

## 2018-08-29 NOTE — ED Notes (Signed)
Home medications taken by pt.

## 2018-08-29 NOTE — ED Provider Notes (Signed)
[facility] EMERGENCY DEPARTMENT Provider Note   CSN: [id number] Arrival date & time: 08/29/18  1409     History   Chief Complaint Chief Complaint  Patient presents with  . Anxiety  . Chest Pain    HPI PANZY BUBECK is a 52 y.o. female.  Patient is a 52 year old female with a history of anemia, stroke, ventricular shunt, GERD, hiatal hernia, hypertension and hyperlipidemia presenting today with multiple vague symptoms.  Patient's family is also present with her and gives the majority of the history.  Husband states that over the last few months specifically over the last month things have been building.  She is having more mood swings and not acting herself.  In the last week it has been severe.  She has had multiple episodes of being tearful throughout the day, quickly angered and yelling constantly.  They state this is just not like her.  Over the last 2 to 3 days she has had intermittent bouts of chest discomfort, shortness of breath and also had some tingling and arm discomfort.  She states that with sleeping she wakes up with the pain it is a sharp tingly sensation right under her left breast that sometimes will cause pressure in the center of her chest.  That is been present today since waking up.  She also noted some tingling and heaviness in her left arm.  She is not sure if it is made worse by eating or lying down.  She states also it seemed to be worse when she tried to take a shower.  She denies any shortness of breath at this time.  She has had no nausea, vomiting or localized abdominal pain.  She suffers from constipation chronically but it has not changed and there have been no stool changes in color such as black stools.  She last month in September was changed from Ativan to Xanax.  She states since this change she thinks that it is significantly changing her mood.  She is seen by the pain clinic in [place] and when they found out she was on Xanax they  felt strongly that she needed to get off this medication.  She called her doctor today but because of all of her other symptoms they felt that it was most important for her to come here and be medically cleared.  Patient does have an appointment with behavioral health next week and denies any suicidal or homicidal ideation.  She is sleeping without too much difficulty but has had flights of ideas, agitation, racing thoughts.  The history is provided by the patient.  Anxiety  This is a chronic problem. Associated symptoms include chest pain and shortness of breath. Pertinent negatives include no abdominal pain. Associated symptoms comments: fatigue.  Chest Pain   The pain is present in the substernal region and lateral region. The pain is at a severity of 5/10. The pain is moderate. The quality of the pain is described as sharp. The pain radiates to the left arm. Duration of episode(s) is 5 days (intermittently). Associated symptoms include shortness of breath. Pertinent negatives include no abdominal pain, no cough, no diaphoresis, no dizziness, no fever and no vomiting. She has tried rest for the symptoms. The treatment provided no relief.  Her family medical history is significant for CAD. Family history comments: Grandfather with heart attack in his 18s, father with massive heart attack in his 43s and death Past workup comments: Patient herself has no known cardiac history.  Past Medical History:  Diagnosis Date  . Anemia   . Anxiety   . Cerebral ventricular shunt fitting or adjustment   . COMMON MIGRAINE 10/01/2007   Chronic    . Depression   . GERD (gastroesophageal reflux disease)   . GLUCOSE INTOLERANCE 10/01/2007   Qualifier: Diagnosis of  By: Jonny Ruiz MD, Len Blalock   . Hiatal hernia   . Hydrocephalus (HCC)   . Hyperlipidemia   . HYPERLIPIDEMIA 10/01/2007   Chronic. Lovaza is too $$$ Declined statins due to worries   . IDA (iron deficiency anemia)   . Memory loss   . Migraine   .  OBSTRUCTIVE SLEEP APNEA 10/01/2007   NPSG 2006:  AHI 9/hr Started cpap 2009 successfully.     . Paresthesia   . Schatzki's ring   . Sleep apnea   . Stroke [facility]) 03/2013    Patient Active Problem List   Diagnosis Date Noted  . Seasonal allergic rhinitis 07/01/2017  . Neck pain, acute 06/28/2017  . Wound, open, foot 01/23/2015  . Constipation 01/23/2015  . Occasional confusion 12/18/2014  . Memory loss 12/18/2014  . Low back pain radiating to left lower extremity 08/29/2014  . Upper back pain 07/29/2014  . Anemia 07/10/2014  . Well adult exam 07/07/2014  . GERD (gastroesophageal reflux disease) 10/14/2013  . Neck pain, chronic 08/08/2013  . Hydrocephalus (HCC) 04/15/2013  . Paresthesia of left arm and leg 04/01/2013  . Abdominal pain, other specified site 09/20/2012  . Adjustment disorder with mixed anxiety and depressed mood 09/16/2010  . NAUSEA 09/16/2010  . Hypoglycemia 09/03/2010  . Fatigue 06/29/2010  . CHEST PAIN 11/30/2009  . Cough 10/13/2009  . Chronic anxiety 11/19/2008  . GLUCOSE INTOLERANCE 10/01/2007  . Dyslipidemia 10/01/2007  . OSA on CPAP 10/01/2007  . COMMON MIGRAINE 10/01/2007  . ABSCESS, LEG 10/01/2007  . Presence of cerebrospinal fluid drainage device 10/01/2007    Past Surgical History:  Procedure Laterality Date  . [place] Shunt  [id number],   cerebral vascular shunt/ with a revision 1981  . CHOLECYSTECTOMY  1981  . CYST REMOVAL NECK    . Pituitary cyst w/subsequent shunt       OB History   None      Home Medications    Prior to Admission medications   Medication Sig Start Date End Date Taking? Authorizing Provider  ALPRAZolam Prudy Feeler) 1 MG tablet Take 0.5-1 tablets (0.5-1 mg total) by mouth 2 (two) times daily as needed for anxiety. 07/18/18   Plotnikov, Georgina Quint, MD  aspirin 81 MG tablet Take 81 mg by mouth daily.    [provider]  atorvastatin (LIPITOR) 20 MG tablet Take 1 tablet (20 mg total) by mouth daily at 6 PM. 07/19/18    Plotnikov, Georgina Quint, MD  Calcium Carbonate-Vitamin D (CALCIUM-VITAMIN D) 500-200 MG-UNIT per tablet Take 1 tablet by mouth 2 (two) times daily with a meal.    [provider]  Docusate Calcium (STOOL SOFTENER PO) Take by mouth 2 (two) times daily.    [provider]  donepezil (ARICEPT) 10 MG tablet Take 1 tablet (10 mg total) by mouth at bedtime. 07/18/18 07/18/19  Plotnikov, Georgina Quint, MD  DULoxetine (CYMBALTA) 60 MG capsule TAKE ONE CAPSULE BY MOUTH DAILY 04/18/18   Plotnikov, Georgina Quint, MD  lactulose (CHRONULAC) 10 GM/15ML solution Take 30-45 mLs (20-30 g total) by mouth 2 (two) times daily as needed for moderate constipation or severe constipation. 07/18/18   Plotnikov, Georgina Quint, MD  lamoTRIgine (  LAMICTAL) 100 MG tablet TAKE 1 TABLET BY MOUTH IN THE MORNING, AND TAKE 1 AND ONE HALF TABLETS AT NIGHT. Patient needs office visit before refills will be given 06/01/18   Plotnikov, Georgina Quint, MD  lidocaine (LIDODERM) 5 % Place 1 patch onto the skin daily. Remove & Discard patch within 12 hours or as directed by MD 06/29/17   Corwin Levins, MD  meloxicam (MOBIC) 15 MG tablet TAKE 1/2 TO 1 TABLET BY MOUTH DAILY AS NEEDED FOR PAIN 01/22/18   Plotnikov, Georgina Quint, MD  ranitidine (ZANTAC) 150 MG tablet TAKE 1 TABLET BY MOUTH TWICE A DAY X 2 WEEKS,THEN ONCE DAILY 10/07/15   Plotnikov, Georgina Quint, MD    Family History Family History  Problem Relation Age of Onset  . Dementia Mother   . Kidney cancer Mother   . Alzheimer's disease Mother   . Migraines Mother   . Colon polyps Mother   . Heart disease Father   . Diabetes Father   . Melanoma Other   . Lung cancer Other   . Lung cancer Other   . Brain cancer Maternal Aunt   . Lung cancer Maternal Uncle   . Esophageal cancer Maternal Grandmother   . Alzheimer's disease Paternal Grandmother   . Colon cancer Neg Hx     Social History Social History   Tobacco Use  . Smoking status: Never Smoker  . Smokeless tobacco: Never Used  Substance  Use Topics  . Alcohol use: Yes    Alcohol/week: 1.0 standard drinks    Types: 1 Glasses of wine per week    Comment: Social  . Drug use: No     Allergies   Citalopram hydrobromide   Review of Systems Review of Systems  Constitutional: Negative for diaphoresis and fever.  Respiratory: Positive for shortness of breath. Negative for cough.   Cardiovascular: Positive for chest pain.  Gastrointestinal: Negative for abdominal pain and vomiting.  Neurological: Negative for dizziness.  All other systems reviewed and are negative.    Physical Exam Updated Vital Signs BP (!) 158/81   Pulse 67   Resp 19   SpO2 100%   Physical Exam  Constitutional: She is oriented to person, place, and time. She appears well-developed and well-nourished. She appears distressed.  HENT:  Head: Normocephalic and atraumatic.  Eyes: Pupils are equal, round, and reactive to light. EOM are normal.  Cardiovascular: Normal rate, regular rhythm, normal heart sounds and intact distal pulses. Exam reveals no friction rub.  No murmur heard. Pulmonary/Chest: Effort normal and breath sounds normal. She has no wheezes. She has no rales.  Abdominal: Soft. Bowel sounds are normal. She exhibits no distension. There is tenderness in the epigastric area. There is no rebound and no guarding.  Musculoskeletal: Normal range of motion. She exhibits no tenderness.  No edema  Neurological: She is alert and oriented to person, place, and time. No cranial nerve deficit.  Skin: Skin is warm and dry. Capillary refill takes less than 2 seconds. No rash noted.  Psychiatric: Her behavior is normal. Her mood appears anxious. Her affect is labile. She expresses no homicidal and no suicidal ideation. She exhibits abnormal recent memory.  Intermittently tearful  Nursing note and vitals reviewed.    ED Treatments / Results  Labs (all labs ordered are listed, but only abnormal results are displayed) Labs Reviewed  BASIC METABOLIC  PANEL - Abnormal; Notable for the following components:      Result Value   Glucose, Bld 100 (*)  Creatinine, Ser 1.25 (*)    GFR calc non Af Amer 48 (*)    GFR calc Af Amer 55 (*)    All other components within normal limits  CBC - Abnormal; Notable for the following components:   RBC 3.84 (*)    Hemoglobin 9.9 (*)    HCT 33.1 (*)    MCH 25.8 (*)    MCHC 29.9 (*)    RDW 15.8 (*)    Platelets 488 (*)    All other components within normal limits  I-STAT TROPONIN, ED  I-STAT TROPONIN, ED    EKG EKG Interpretation  Date/Time:  Wednesday August 29 2018 14:20:08 EDT Ventricular Rate:  73 PR Interval:  158 QRS Duration: 84 QT Interval:  398 QTC Calculation: 438 R Axis:   26 Text Interpretation:  Normal sinus rhythm Cannot rule out Anterior infarct , age undetermined No significant change since last tracing Confirmed by Gwyneth Sprout ([id number]) on 08/29/2018 3:29:19 PM   Radiology Dg Chest 2 View  Result Date: 08/29/2018 CLINICAL DATA:  Chest pain, anxiety, emotional lability. Began a new medication last week. Patient was recently awakened with shortness of breath and left arm pain. EXAM: CHEST - 2 VIEW COMPARISON:  Portable chest x-ray of October 21, 2015 FINDINGS: The lungs are adequately inflated and clear. The heart is top-normal in size. The pulmonary vascularity is not engorged. The mediastinum is normal in width. There is no pleural effusion. There is S shaped thoracolumbar scoliosis. There is mild multilevel degenerative disc disease of the thoracic spine. There is a ventriculoperitoneal shunt to the right of midline. Its lower portion is faintly followed into the abdomen. IMPRESSION: Stable cardiomegaly.  There is no active cardiopulmonary disease. Electronically Signed   By: David  [place] M.D.   On: 08/29/2018 15:36    Procedures Procedures (including critical care time)  Medications Ordered in ED Medications  gi cocktail (Maalox,Lidocaine,Donnatal) (30 mLs Oral  Given 08/29/18 1636)     Initial Impression / Assessment and Plan / ED Course  I have reviewed the triage vital signs and the nursing notes.  Pertinent labs & imaging results that were available during my care of the patient were reviewed by me and considered in my medical decision making (see chart for details).    52 year old female presenting today with multiple psychiatric complaints as well as chest pain.  Patient is a heart score of 3 for age and risk factors.  Her EKG without acute changes, initial troponin is within normal limits.  Patient does not describe any symptoms that are suggestive of PE or dissection.  Patient is having a lot of issues with her anxiety and thinks she is having a bad reaction to starting Xanax 2 months ago.  Family states her symptoms have been building but is doubly been worse in the last week.  Patient also has a history of GERD and a hiatal hernia.  Her chest pain is most pronounced she recalls when lying down at night.  However she does not know if it is worse with eating.  She denies any infectious symptoms concerning for pneumonia, pericarditis, hepatitis, pancreatitis, cholecystitis.  She has no peritoneal signs on exam but does have epigastric discomfort.  She has pulses present in all 4 extremities.  Patient has a labile mood with intermittent tearfulness.  She does take a significant amount of narcotic medication per the pain clinic and should not be on long-acting Xanax.  Patient does not use alcohol or drugs.  Patient also  was noted to be anemic today with a hemoglobin of 9.9 which is 2 g lower than last year.  Patient states in the past they thought her anemia was due to diet.  She denies any evidence of bleeding.  This is most likely coincidental and has nothing to do with her symptoms today.  We will have pharmacy go over patient's medications with her.  We will also have behavioral health evaluate the patient due to her emotional issues.  Patient was given  GI cocktail.  Also will do a delta troponin.  6:14 PM Delta trop neg.   9:08 PM Patient was evaluated by behavioral health and recommended staying overnight for reevaluation by a psychiatrist in the morning.  Will discuss with patient to see if she is willing to stay.  11:31 PM Pt plans on staying till morning   Final Clinical Impressions(s) / ED Diagnoses   Final diagnoses:  Anxiety  Polypharmacy  Gastroesophageal reflux disease with esophagitis    ED Discharge Orders    None       Gwyneth Sprout, MD 08/29/18 [phone]

## 2018-08-29 NOTE — ED Notes (Signed)
Patient transported to x-ray. ?
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
## 2018-08-29 NOTE — ED Notes (Signed)
TTS at bedside. 

## 2018-08-29 NOTE — ED Notes (Addendum)
Pt. wanded by security. Belongings and medications given to family member who is taking them home. Pt. Will only have her glasses and cane with her.

## 2018-08-29 NOTE — Telephone Encounter (Signed)
Pt' sister calling, pt is present during call and able to answer questions.  Pt reports "Panic attack" Saturday night; left arm numb, heart racing and bounding, SOB, crying. States is pt at pain clinic (Dr. Randel Pigg) and is taking Oxycodone HCL 20mg  q6hrs; not on current med profile.  States started Xanax 07/18/18 and "Has not been right since." per sister. States pt saw Dr. Randel Pigg who suggested she wean off Xanax by taking 0.5mg  qam and qpm. Pt's sister reports pt is presently "Very hyper, crying all the time, angry affect"   and emotionally labile.   "Goes off the handle, talking a mile a minute."  Worsening symptoms since Saturday. Also notes some confusion, and "Can't focus." Pt does have an appt with Behavioral Health 10/03/18. "Can't wait that long."  Pt reports she did not take xanax last night, then recalled she had taken med..States she has not taken this am.  Pt sounds very distressed during call. Crying, hyperverbal, sob.  Directed to ED; sister is with pt, will drive.   Reason for Disposition . Patient sounds very sick or weak to the triager  Answer Assessment - Initial Assessment Questions 1. CONCERN: "What happened that made you call today?"     Increased anxiety, labile, "Hyper", panic attack Saturday, angry affect 2. ANXIETY SYMPTOM SCREENING: "Can you describe how you have been feeling?"  (e.g., tense, restless, panicky, anxious, keyed up, trouble sleeping, trouble concentrating)    Panicky, can't focus, confusion 3. ONSET: "How long have you been feeling this way?"     Mild for several weeks since starting Xanax, worsening since Saturday 4. RECURRENT: "Have you felt this way before?"  If yes: "What happened that time?" "What helped these feelings go away in the past?"     H/O anxiety 5. RISK OF HARM - SUICIDAL IDEATION:  "Do you ever have thoughts of hurting or killing yourself?"  (e.g., yes, no, no but preoccupation with thoughts about death)   - INTENT:  "Do you have thoughts of hurting  or killing yourself right NOW?" (e.g., yes, no, N/A)   - PLAN: "Do you have a specific plan for how you would do this?" (e.g., gun, knife, overdose, no plan, N/A)     no 6. RISK OF HARM - HOMICIDAL IDEATION:  "Do you ever have thoughts of hurting or killing someone else?"  (e.g., yes, no, no but preoccupation with thoughts about death)   - INTENT:  "Do you have thoughts of hurting or killing someone right NOW?" (e.g., yes, no, N/A)   - PLAN: "Do you have a specific plan for how you would do this?" (e.g., gun, knife, no plan, N/A)      no 7. FUNCTIONAL IMPAIRMENT: "How have things been going for you overall in your life? Have you had any more difficulties than usual doing your normal daily activities?"  (e.g., better, same, worse; self-care, school, work, interactions)      8. SUPPORT: "Who is with you now?" "Who do you live with?" "Do you have family or friends nearby who you can talk to?"      Sister present during call 9. THERAPIST: "Do you have a counselor or therapist? Name?"    HAs appt scheduled with [facility] 10/03/18 10. STRESSORS: "Has there been any new stress or recent changes in your life?"        11. CAFFEINE ABUSE: "Do you drink caffeinated beverages, and how much each day?" (e.g., coffee, tea, colas)        12.  SUBSTANCE ABUSE: "Do you use any illegal drugs or alcohol?"        13. OTHER SYMPTOMS: "Do you have any other physical symptoms right now?" (e.g., chest pain, palpitations, difficulty breathing, fever)      Left arm numb Saturday night, heart racing and bounding,SOB  Protocols used: ANXIETY AND PANIC ATTACK-A-AH

## 2018-08-30 ENCOUNTER — Encounter (HOSPITAL_COMMUNITY): Payer: Self-pay | Admitting: Registered Nurse

## 2018-08-30 DIAGNOSIS — F4323 Adjustment disorder with mixed anxiety and depressed mood: Secondary | ICD-10-CM

## 2018-08-30 DIAGNOSIS — Z79899 Other long term (current) drug therapy: Secondary | ICD-10-CM | POA: Insufficient documentation

## 2018-08-30 MED ORDER — BUSPIRONE HCL 5 MG PO TABS: 5.0000 mg | ORAL_TABLET | Freq: Two times a day (BID) | ORAL | 0 refills | Status: DC

## 2018-08-30 NOTE — ED Notes (Signed)
Pt doing crossword puzzel and watching TV given soida

## 2018-08-30 NOTE — Progress Notes (Signed)
Pt. Refused cpap. 

## 2018-08-30 NOTE — ED Notes (Signed)
Family arrived with patient belongings for patient being discharged home.

## 2018-08-30 NOTE — ED Notes (Signed)
Patient wants it noted that she would like her gabapentin increased to help with break through pain-Monique,RN

## 2018-08-30 NOTE — ED Notes (Signed)
Regular diet was ordered for Lunch. 

## 2018-08-30 NOTE — ED Notes (Signed)
Patient verbalizes understanding of discharge instructions. Opportunity for questioning and answers were provided. Armband removed by staff, pt discharged from ED. All belongings taken by patient and family member.

## 2018-08-30 NOTE — ED Notes (Signed)
TTS being done 

## 2018-08-30 NOTE — ED Notes (Signed)
Patient was given a Cup of Sprite. 

## 2018-08-30 NOTE — Discharge Instructions (Addendum)
Start taking the buspar as recommended by mental health, stop taking the xanax, follow up with your medical doctor

## 2018-08-30 NOTE — Consult Note (Addendum)
Telepsych Consultation   Reason for Consult:  Abnormal mood, depression Referring Physician: Blanchie Dessert, MD Location of Patient: [facility] Location of Provider: [facility]  Patient Identification: Samantha [name] MRN:  [id number] Principal Diagnosis: Adjustment disorder with mixed anxiety and depressed mood Diagnosis:   Patient Active Problem List   Diagnosis Date Noted  . Seasonal allergic rhinitis [J30.2] 07/01/2017  . Neck pain, acute [M54.2] 06/28/2017  . Wound, open, foot [S91.309A] 01/23/2015  . Constipation [K59.00] 01/23/2015  . Occasional confusion [R41.0] 12/18/2014  . Memory loss [R41.3] 12/18/2014  . Low back pain radiating to left lower extremity [M54.5, M79.605] 08/29/2014  . Upper back pain [M54.9] 07/29/2014  . Anemia [D64.9] 07/10/2014  . Well adult exam [Z00.00] 07/07/2014  . GERD (gastroesophageal reflux disease) [K21.9] 10/14/2013  . Neck pain, chronic [M54.2, G89.29] 08/08/2013  . Hydrocephalus ([place]) [G91.9] 04/15/2013  . Paresthesia of left arm and leg [R20.2] 04/01/2013  . Abdominal pain, other specified site [R10.9] 09/20/2012  . Adjustment disorder with mixed anxiety and depressed mood [F43.23] 09/16/2010  . NAUSEA [R11.0] 09/16/2010  . Hypoglycemia [E16.2] 09/03/2010  . Fatigue [R53.83] 06/29/2010  . CHEST PAIN [R07.9] 11/30/2009  . Cough [R05] 10/13/2009  . Chronic anxiety [F41.9] 11/19/2008  . GLUCOSE INTOLERANCE [E73.9] 10/01/2007  . Dyslipidemia [E78.5] 10/01/2007  . OSA on CPAP [G47.33, Z99.89] 10/01/2007  . COMMON MIGRAINE [G43.009] 10/01/2007  . ABSCESS, LEG [X50.569, L02.419] 10/01/2007  . Presence of cerebrospinal fluid drainage device [Z98.2] 10/01/2007    Total Time spent with patient: 1 hour  Subjective:   Samantha [name] is a 52 y.o. female patient presented to [facility] with complaints of possible adverse reaction to new medication she started last week (feeling anxious, emotionally unstable, and chest pain).  Patient  has an appointment with her outpatient psychiatric provider [facility]) next week and patient denies suicidal ideation.  HPI:   Samantha [name], 52 y.o., female patient seen via telepsych by this provider; chart reviewed and consulted with Dr. Dwyane Dee on 08/30/18.  On evaluation Samantha [name] reports that she came to the hospital related to waking up out of a sleep, having chest pain and numbness and tingling down her left arm.  Patient asked about her Xanax and she stated "Dr. Alain Marion was tapering me off of the Xanax.  I have a problem with my short term memory but I think it had something to do with me taking the pain medication and the Xanax.  It didn't work any way.  I was suppose to take 1/2 of a 1 mg tablet once day but some times I had to take a whole tablet.  Most of the time I get the most anxious when I'm waiting on my pain medicine to kick in.  The other night I took a whole tablet along with my pain medication and my other night time medicine.  I think I stopped breathing and that's why I woke up all of a sudden out of the clear blue."  Patient educated that benzodiazepine and opiate were both CNS depressants and that was why her doctor was tapering her off since it is not recommended that these medications be taken together.  Also so informed that there were other medications that could help with anxiety that wouldn't be as risky to take.  Patient states that she is not experiencing any withdrawal symptoms.  States that she is feeling much better "and I don't think they have given me a Xanax or Lorazepam since I've  been here and I'm not having any withdrawal."  Patient denies N/V, anxiousness, and  Tremors.  States she is eating without difficulty.  States the only reason she didn't sleep as well last night was related to "the noise.  It was like being at the [employer] in here last night."  Patient wanted to know if there was anything she could take for her anxiety until she was back in to  see her doctor.  Informed that we could try Vistaril or buspirone.  Patient denies suicidal/self-harm/homicidal ideation, psychosis, and paranoia.   During evaluation Samantha [name] is sitting up in bed; very pleasant attitude; she is alert/oriented x 4; calm/cooperative with pleasant affect.  She does not appear to be responding to internal/external stimuli or delusional thoughts.  Patient denies suicidal/self-harm/homicidal ideation, psychosis, and paranoia.  Patient answered question appropriately.      Past Psychiatric History: Anxiety, Adjustment disorder with mixed anxiety and depressed mood  Risk to Self: Suicidal Ideation: No Suicidal Intent: No Is patient at risk for suicide?: No Suicidal Plan?: No Access to Means: No What has been your use of drugs/alcohol within the last 12 months?: None How many times?: 0 Other Self Harm Risks: None Triggers for Past Attempts: None known Intentional Self Injurious Behavior: None Risk to Others: Homicidal Ideation: No Thoughts of Harm to Others: No Current Homicidal Intent: No Current Homicidal Plan: No Access to Homicidal Means: No Identified Victim: No one History of harm to others?: No Assessment of Violence: None Noted Violent Behavior Description: None reported Does patient have access to weapons?: No Criminal Charges Pending?: No Does patient have a court date: No Prior Inpatient Therapy: Prior Inpatient Therapy: No Prior Therapy Dates: None Prior Therapy Facilty/Provider(s): None Reason for Treatment: None Prior Outpatient Therapy: Prior Outpatient Therapy: No Does patient have an ACCT team?: No Does patient have Intensive In-House Services?  : No Does patient have [facility] services? : No Does patient have P4CC services?: No  Past Medical History:  Past Medical History:  Diagnosis Date  . Anemia   . Anxiety   . Cerebral ventricular shunt fitting or adjustment   . COMMON MIGRAINE 10/01/2007   Chronic    . Depression   .  GERD (gastroesophageal reflux disease)   . GLUCOSE INTOLERANCE 10/01/2007   Qualifier: Diagnosis of  By: Jenny Reichmann MD, Hunt Oris   . Hiatal hernia   . Hydrocephalus ([place])   . Hyperlipidemia   . HYPERLIPIDEMIA 10/01/2007   Chronic. Lovaza is too $$$ Declined statins due to worries   . IDA (iron deficiency anemia)   . Memory loss   . Migraine   . OBSTRUCTIVE SLEEP APNEA 10/01/2007   NPSG 2006:  AHI 9/hr Started cpap 2009 successfully.     . Paresthesia   . Schatzki's ring   . Sleep apnea   . Stroke [facility]) 03/2013    Past Surgical History:  Procedure Laterality Date  . [place],   cerebral vascular shunt/ with a revision 1981  . CHOLECYSTECTOMY  1981  . CYST REMOVAL NECK    . Pituitary cyst w/subsequent shunt     Family History:  Family History  Problem Relation Age of Onset  . Dementia Mother   . Kidney cancer Mother   . Alzheimer's disease Mother   . Migraines Mother   . Colon polyps Mother   . Heart disease Father   . Diabetes Father   . Melanoma Other   . Lung cancer Other   .  Lung cancer Other   . Brain cancer Maternal Aunt   . Lung cancer Maternal Uncle   . Esophageal cancer Maternal Grandmother   . Alzheimer's disease Paternal Grandmother   . Colon cancer Neg Hx    Family Psychiatric  History: See above list Social History:  Social History   Substance and Sexual Activity  Alcohol Use Yes  . Alcohol/week: 1.0 standard drinks  . Types: 1 Glasses of wine per week   Comment: Social     Social History   Substance and Sexual Activity  Drug Use No    Social History   Socioeconomic History  . Marital status: Married    Spouse name: Elta Guadeloupe  . Number of children: 0  . Years of education: 55  . Highest education level: Not on file  Occupational History  . Occupation: [employer]    Comment: Therapist, art Rep  . Occupation: disabled  Social Needs  . Financial resource strain: Not on file  . Food insecurity:    Worry: Not on file    Inability: Not on  file  . Transportation needs:    Medical: Not on file    Non-medical: Not on file  Tobacco Use  . Smoking status: Never Smoker  . Smokeless tobacco: Never Used  Substance and Sexual Activity  . Alcohol use: Yes    Alcohol/week: 1.0 standard drinks    Types: 1 Glasses of wine per week    Comment: Social  . Drug use: No  . Sexual activity: Yes  Lifestyle  . Physical activity:    Days per week: Not on file    Minutes per session: Not on file  . Stress: Not on file  Relationships  . Social connections:    Talks on phone: Not on file    Gets together: Not on file    Attends religious service: Not on file    Active member of club or organization: Not on file    Attends meetings of clubs or organizations: Not on file    Relationship status: Not on file  Other Topics Concern  . Not on file  Social History Narrative   Mother is in a NH.    Patient lives at home with her husband Elta Guadeloupe). 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    High school education.   Right handed.   Caffeine 1 cup daily.   She has no children   Additional Social History:    Allergies:   Allergies  Allergen Reactions  . Citalopram Hydrobromide     REACTION: low libido    Labs:  Results for orders placed or performed during the hospital encounter of 08/29/18 (from the past 48 hour(s))  Basic metabolic panel     Status: Abnormal   Collection Time: 08/29/18  2:25 PM  Result Value Ref Range   Sodium 141 135 - 145 mmol/L   Potassium 4.0 3.5 - 5.1 mmol/L   Chloride 108 98 - 111 mmol/L   CO2 24 22 - 32 mmol/L   Glucose, Bld 100 (H) 70 - 99 mg/dL   BUN 14 6 - 20 mg/dL   Creatinine, Ser 1.25 (H) 0.44 - 1.00 mg/dL   Calcium 9.4 8.9 - 10.3 mg/dL   GFR calc non Af Amer 48 (L) >60 mL/min   GFR calc Af Amer 55 (L) >60 mL/min    Comment: (NOTE) The eGFR has been calculated using the CKD EPI equation. This calculation has not been validated in all clinical  situations. eGFR's persistently <60 mL/min  signify possible Chronic Kidney Disease.    Anion gap 9 5 - 15    Comment: Performed at [facility] [address], [place], [place] [address]  CBC     Status: Abnormal   Collection Time: 08/29/18  2:25 PM  Result Value Ref Range   WBC 8.3 4.0 - 10.5 K/uL   RBC 3.84 (L) 3.87 - 5.11 MIL/uL   Hemoglobin 9.9 (L) 12.0 - 15.0 g/dL   HCT 33.1 (L) 36.0 - 46.0 %   MCV 86.2 80.0 - 100.0 fL   MCH 25.8 (L) 26.0 - 34.0 pg   MCHC 29.9 (L) 30.0 - 36.0 g/dL   RDW 15.8 (H) 11.5 - 15.5 %   Platelets 488 (H) 150 - 400 K/uL   nRBC 0.0 0.0 - 0.2 %    Comment: Performed at [facility] [address] [place], [place] [address]  I-stat troponin, ED     Status: None   Collection Time: 08/29/18  2:42 PM  Result Value Ref Range   Troponin i, poc 0.00 0.00 - 0.08 ng/mL   Comment 3            Comment: Due to the release kinetics of cTnI, a negative result within the first hours of the onset of symptoms does not rule out myocardial infarction with certainty. If myocardial infarction is still suspected, repeat the test at appropriate intervals.   I-stat troponin, ED     Status: None   Collection Time: 08/29/18  5:38 PM  Result Value Ref Range   Troponin i, poc 0.00 0.00 - 0.08 ng/mL   Comment 3            Comment: Due to the release kinetics of cTnI, a negative result within the first hours of the onset of symptoms does not rule out myocardial infarction with certainty. If myocardial infarction is still suspected, repeat the test at appropriate intervals.     Medications:  Current Facility-Administered Medications  Medication Dose Route Frequency Provider Last Rate Last Dose  . aspirin EC tablet 81 mg  81 mg Oral Daily Blanchie Dessert, MD   81 mg at 08/30/18 1025  . atorvastatin (LIPITOR) tablet 20 mg  20 mg Oral q1800 Plunkett, Whitney, MD      . docusate sodium (COLACE) capsule 100 mg  100 mg Oral BID Blanchie Dessert, MD   100 mg at 08/30/18 1026  . donepezil (ARICEPT)  tablet 10 mg  10 mg Oral QHS Plunkett, Whitney, MD      . DULoxetine (CYMBALTA) DR capsule 60 mg  60 mg Oral Daily Blanchie Dessert, MD   60 mg at 08/30/18 1025  . famotidine (PEPCID) tablet 20 mg  20 mg Oral Daily Blanchie Dessert, MD   20 mg at 08/30/18 1050  . gabapentin (NEURONTIN) capsule 300 mg  300 mg Oral TID Blanchie Dessert, MD   300 mg at 08/30/18 1026  . lactulose (CHRONULAC) 10 GM/15ML solution 20-30 g  20-30 g Oral BID PRN Blanchie Dessert, MD      . lamoTRIgine (LAMICTAL) tablet 100 mg  100 mg Oral Hassie Bruce, Loree Fee, MD   100 mg at 08/30/18 0811  . lamoTRIgine (LAMICTAL) tablet 150 mg  150 mg Oral QHS Plunkett, Whitney, MD      . meloxicam (MOBIC) tablet 15 mg  15 mg Oral Q breakfast Blanchie Dessert, MD   15 mg at 08/30/18 0833  . oxyCODONE (Oxy IR/ROXICODONE)  immediate release tablet 20 mg  20 mg Oral Q6H Blanchie Dessert, MD   20 mg at 08/30/18 1026   Current Outpatient Medications  Medication Sig Dispense Refill  . aspirin 81 MG tablet Take 81 mg by mouth daily.    Marland Kitchen atorvastatin (LIPITOR) 20 MG tablet Take 1 tablet (20 mg total) by mouth daily at 6 PM. 30 tablet 2  . Calcium Carbonate-Vitamin D (CALCIUM-VITAMIN D) 500-200 MG-UNIT per tablet Take 1 tablet by mouth 2 (two) times daily with a meal.    . Docusate Calcium (STOOL SOFTENER PO) Take by mouth 2 (two) times daily.    Marland Kitchen donepezil (ARICEPT) 10 MG tablet Take 1 tablet (10 mg total) by mouth at bedtime. 90 tablet 3  . DULoxetine (CYMBALTA) 60 MG capsule TAKE ONE CAPSULE BY MOUTH DAILY 90 capsule 1  . gabapentin (NEURONTIN) 300 MG capsule Take 300 mg by mouth 3 (three) times daily.    Marland Kitchen lactulose (CHRONULAC) 10 GM/15ML solution Take 30-45 mLs (20-30 g total) by mouth 2 (two) times daily as needed for moderate constipation or severe constipation. 946 mL 5  . lamoTRIgine (LAMICTAL) 100 MG tablet TAKE 1 TABLET BY MOUTH IN THE MORNING, AND TAKE 1 AND ONE HALF TABLETS AT NIGHT. Patient needs office visit before  refills will be given 240 tablet 0  . meloxicam (MOBIC) 15 MG tablet TAKE 1/2 TO 1 TABLET BY MOUTH DAILY AS NEEDED FOR PAIN 30 tablet 3  . Oxycodone HCl 20 MG TABS Take 1 tablet by mouth every 6 (six) hours.    . ranitidine (ZANTAC) 150 MG tablet TAKE 1 TABLET BY MOUTH TWICE A DAY X 2 WEEKS,THEN ONCE DAILY 60 tablet 0  . busPIRone (BUSPAR) 5 MG tablet Take 1 tablet (5 mg total) by mouth 2 (two) times daily. 28 tablet 0  . lidocaine (LIDODERM) 5 % Place 1 patch onto the skin daily. Remove & Discard patch within 12 hours or as directed by MD (Patient not taking: Reported on 08/29/2018) 60 patch 0    Musculoskeletal: Strength & Muscle Tone: within normal limits Gait & Station: Did not see patient ambulate; but states she needs a walker or cane Patient leans: N/A  Psychiatric Specialty Exam: Physical Exam  ROS  Blood pressure (!) 162/95, pulse 71, temperature 98.2 F (36.8 C), temperature source Oral, resp. rate 16, SpO2 98 %.There is no height or weight on file to calculate BMI.  General Appearance: Casual  Eye Contact:  Good  Speech:  Blocked and Normal Rate  Volume:  Normal  Mood:  Appropriate  Affect:  Appropriate and Congruent  Thought Process:  Coherent and Goal Directed  Orientation:  Full (Time, Place, and Person)  Thought Content:  WDL and Logical  Suicidal Thoughts:  No  Homicidal Thoughts:  No  Memory:  Immediate;   Fair Recent;   Fair Remote;   [place]  Patient reports problem with memory since stroke in 2013  Judgement:  Intact  Insight:  Good  Psychomotor Activity:  Normal  Concentration:  Concentration: Good and Attention Span: Good  Recall:  [place] of Knowledge:  Good  Language:  Good  Akathisia:  No  Handed:  Right  AIMS (if indicated):     Assets:  Communication Skills Desire for Improvement Housing Resilience Social Support  ADL's:  Intact  Cognition:  WNL  Sleep:        Treatment Plan Summary: Plan Follow up with current provider related to  medication management  Prescription for Buspirone 5 mg Bid for anxiety enough until patient can get in to see her provider.   Disposition: No evidence of imminent risk to self or others at present.   Patient does not meet criteria for psychiatric inpatient admission. Supportive therapy provided about ongoing stressors. Discussed crisis plan, support from social network, calling 911, coming to the Emergency Department, and calling Suicide Hotline.  This service was provided via telemedicine using a 2-way, interactive audio and video technology.  Names of all persons participating in this telemedicine service and their role in this encounter. Name: Earleen Newport, NP Role: Tele psych assessment  Name: Dr. Dwyane Dee Role: Psychiatrist  Name: Samantha [name]  Role: Patient  Name: Dr. Tomi Bamberger Role:  Informed of above recommendation and disposition    Deija Buhrman, NP 08/30/2018 2:35 PM

## 2018-08-30 NOTE — ED Notes (Signed)
Patient using phone at this time.

## 2018-08-30 NOTE — ED Notes (Signed)
Pt got lunch  

## 2018-08-30 NOTE — ED Notes (Signed)
Pt spoke to husband and sister on phone

## 2018-08-30 NOTE — ED Notes (Signed)
tts finished and pt states she is going home, [facility] called to speak to Dr Lynelle Doctor

## 2018-08-30 NOTE — ED Notes (Signed)
Regular Diet was ordered for Dinner. 

## 2018-08-30 NOTE — ED Provider Notes (Signed)
Patient was assessed by behavioral health.  She is cleared from a psychiatric standpoint.  They recommend having her stop the Xanax and to start taking BuSpar 5 mg twice daily.  Patient is medically cleared for discharge   Linwood Dibbles, MD 08/30/18 1428

## 2018-09-01 ENCOUNTER — Other Ambulatory Visit: Payer: Self-pay | Admitting: Internal Medicine

## 2018-09-13 DIAGNOSIS — M792 Neuralgia and neuritis, unspecified: Secondary | ICD-10-CM | POA: Diagnosis not present

## 2018-09-13 DIAGNOSIS — M5412 Radiculopathy, cervical region: Secondary | ICD-10-CM | POA: Diagnosis not present

## 2018-09-13 DIAGNOSIS — M5136 Other intervertebral disc degeneration, lumbar region: Secondary | ICD-10-CM | POA: Diagnosis not present

## 2018-09-13 DIAGNOSIS — M47816 Spondylosis without myelopathy or radiculopathy, lumbar region: Secondary | ICD-10-CM | POA: Diagnosis not present

## 2018-09-14 ENCOUNTER — Telehealth: Payer: Self-pay | Admitting: Internal Medicine

## 2018-09-14 NOTE — Telephone Encounter (Signed)
Copied from CRM [id number]. Topic: Quick Communication - Rx Refill/Question >> Sep 14, 2018  1:06 PM Jens Som A wrote: Medication: busPIRone (BUSPAR) 5 MG tablet [[id number]]  Patient had a allergic reaction to Xanax and ended up in the ER and the ER doctor wrote the the script for Buspar  Has the patient contacted their pharmacy? Yes  (Agent: If no, request that the patient contact the pharmacy for the refill.) (Agent: If yes, when and what did the pharmacy advise?)  Preferred Pharmacy (with phone number or street name):  [facility] at [facility], [place] - [address]-(Before 5PM)  [address] [place] [address] Phone: [phone] Fax: [phone]  [facility] [address] [place], [place] [address] [phone] (After 5pm)   Agent: Please be advised that RX refills may take up to 3 business days. We ask that you follow-up with your pharmacy.

## 2018-09-14 NOTE — Telephone Encounter (Signed)
Pt calling to find out if this can be called in today.  Pt states that she is completely out of medication.

## 2018-09-17 MED ORDER — BUSPIRONE HCL 5 MG PO TABS: 5.0000 mg | ORAL_TABLET | Freq: Two times a day (BID) | ORAL | 5 refills | Status: DC

## 2018-09-17 NOTE — Telephone Encounter (Signed)
Is it Buspar? Thx

## 2018-09-17 NOTE — Telephone Encounter (Signed)
Yes

## 2018-09-17 NOTE — Telephone Encounter (Signed)
Please advise about refill

## 2018-09-17 NOTE — Telephone Encounter (Signed)
Ok Thx 

## 2018-09-17 NOTE — Telephone Encounter (Signed)
Patient is calling to follow up on this message. She states she took the last pill on 09/15/18

## 2018-09-27 ENCOUNTER — Other Ambulatory Visit: Payer: Self-pay | Admitting: Internal Medicine

## 2018-10-03 ENCOUNTER — Ambulatory Visit (INDEPENDENT_AMBULATORY_CARE_PROVIDER_SITE_OTHER): Payer: Medicare Other | Admitting: Psychology

## 2018-10-03 DIAGNOSIS — F418 Other specified anxiety disorders: Secondary | ICD-10-CM

## 2018-10-18 ENCOUNTER — Other Ambulatory Visit: Payer: Self-pay | Admitting: Internal Medicine

## 2018-10-18 MED ORDER — DULOXETINE HCL 60 MG PO CPEP: 60.0000 mg | ORAL_CAPSULE | Freq: Every day | ORAL | 0 refills | Status: DC

## 2018-10-18 NOTE — Telephone Encounter (Signed)
Copied from CRM [phone]. Topic: Quick Communication - Rx Refill/Question >> Oct 18, 2018 10:22 AM Percival SpanishKennedy, Cheryl W wrote: Medication  DULoxetine (CYMBALTA) 60 MG capsule   pt said she out of the medication   Has the patient contacted their pharmacy yes    Agent  If yes, when and what did the pharmacy advise  told her to contact doctor office since she was out of refills   [facility]   Agent: Please be advised that RX refills may take up to 3 business days. We ask that you follow-up with your pharmacy.

## 2018-10-26 ENCOUNTER — Ambulatory Visit: Payer: Medicare Other | Admitting: Psychology

## 2018-11-20 DIAGNOSIS — Z5181 Encounter for therapeutic drug level monitoring: Secondary | ICD-10-CM | POA: Diagnosis not present

## 2018-11-20 DIAGNOSIS — M5412 Radiculopathy, cervical region: Secondary | ICD-10-CM | POA: Diagnosis not present

## 2018-11-20 DIAGNOSIS — M792 Neuralgia and neuritis, unspecified: Secondary | ICD-10-CM | POA: Diagnosis not present

## 2018-11-20 DIAGNOSIS — M47816 Spondylosis without myelopathy or radiculopathy, lumbar region: Secondary | ICD-10-CM | POA: Diagnosis not present

## 2018-11-20 DIAGNOSIS — M5136 Other intervertebral disc degeneration, lumbar region: Secondary | ICD-10-CM | POA: Diagnosis not present

## 2018-11-20 DIAGNOSIS — Z79899 Other long term (current) drug therapy: Secondary | ICD-10-CM | POA: Diagnosis not present

## 2018-12-19 DIAGNOSIS — M25561 Pain in right knee: Secondary | ICD-10-CM | POA: Diagnosis not present

## 2018-12-19 DIAGNOSIS — M25552 Pain in left hip: Secondary | ICD-10-CM | POA: Diagnosis not present

## 2018-12-19 DIAGNOSIS — M25551 Pain in right hip: Secondary | ICD-10-CM | POA: Diagnosis not present

## 2018-12-19 DIAGNOSIS — M25562 Pain in left knee: Secondary | ICD-10-CM | POA: Diagnosis not present

## 2018-12-27 ENCOUNTER — Telehealth: Payer: Self-pay | Admitting: Internal Medicine

## 2018-12-27 ENCOUNTER — Ambulatory Visit: Payer: Self-pay | Admitting: Internal Medicine

## 2018-12-27 MED ORDER — DULOXETINE HCL 60 MG PO CPEP: 60.0000 mg | ORAL_CAPSULE | Freq: Every day | ORAL | 0 refills | Status: DC

## 2018-12-27 MED ORDER — MELOXICAM 15 MG PO TABS: ORAL_TABLET | ORAL | 0 refills | Status: DC

## 2018-12-27 NOTE — Telephone Encounter (Signed)
Copied from CRM [id number]. Topic: Quick Communication - Rx Refill/Question >> Dec 27, 2018 11:59 AM Elliot Gault wrote: Medication: DULoxetine (CYMBALTA) 60 MG capsule, meloxicam (MOBIC) 15 MG tablet   Has the patient contacted their pharmacy?  No  (Agent: If no, request that the patient contact the pharmacy for the refill.) patient [facility] today due to her arthritis and [facility] to 01/14/2019, patient will run out of medication and would like PCP to refill until appointment  Preferred Pharmacy (with phone number or street name):  [facility] at [facility], [place] - [address] [phone] (Phone) [phone] (Fax)  Agent: Please be advised that RX refills may take up to 3 business days. We ask that you follow-up with your pharmacy.

## 2018-12-27 NOTE — Telephone Encounter (Signed)
RX sent

## 2019-01-14 ENCOUNTER — Encounter: Payer: Self-pay | Admitting: Internal Medicine

## 2019-01-14 ENCOUNTER — Ambulatory Visit (INDEPENDENT_AMBULATORY_CARE_PROVIDER_SITE_OTHER): Payer: Medicare HMO | Admitting: Internal Medicine

## 2019-01-14 ENCOUNTER — Other Ambulatory Visit (INDEPENDENT_AMBULATORY_CARE_PROVIDER_SITE_OTHER): Payer: Medicare HMO

## 2019-01-14 VITALS — BP 134/86 | HR 94 | Temp 98.2°F | Ht <= 58 in | Wt 140.0 lb

## 2019-01-14 DIAGNOSIS — M79605 Pain in left leg: Secondary | ICD-10-CM

## 2019-01-14 DIAGNOSIS — E785 Hyperlipidemia, unspecified: Secondary | ICD-10-CM

## 2019-01-14 DIAGNOSIS — F419 Anxiety disorder, unspecified: Secondary | ICD-10-CM

## 2019-01-14 DIAGNOSIS — M545 Low back pain, unspecified: Secondary | ICD-10-CM

## 2019-01-14 DIAGNOSIS — Z7409 Other reduced mobility: Secondary | ICD-10-CM | POA: Diagnosis not present

## 2019-01-14 DIAGNOSIS — R413 Other amnesia: Secondary | ICD-10-CM | POA: Diagnosis not present

## 2019-01-14 LAB — CBC WITH DIFFERENTIAL/PLATELET
Basophils Absolute: 0 10*3/uL (ref 0.0–0.1)
Basophils Relative: 0.6 % (ref 0.0–3.0)
Eosinophils Absolute: 0.2 10*3/uL (ref 0.0–0.7)
Eosinophils Relative: 3.3 % (ref 0.0–5.0)
HCT: 34.2 % — ABNORMAL LOW (ref 36.0–46.0)
Hemoglobin: 11.2 g/dL — ABNORMAL LOW (ref 12.0–15.0)
Lymphocytes Relative: 27.4 % (ref 12.0–46.0)
Lymphs Abs: 1.7 10*3/uL (ref 0.7–4.0)
MCHC: 32.8 g/dL (ref 30.0–36.0)
MCV: 84.2 fl (ref 78.0–100.0)
Monocytes Absolute: 0.6 10*3/uL (ref 0.1–1.0)
Monocytes Relative: 8.8 % (ref 3.0–12.0)
Neutro Abs: 3.8 10*3/uL (ref 1.4–7.7)
Neutrophils Relative %: 59.9 % (ref 43.0–77.0)
Platelets: 450 10*3/uL — ABNORMAL HIGH (ref 150.0–400.0)
RBC: 4.07 Mil/uL (ref 3.87–5.11)
RDW: 15.9 % — ABNORMAL HIGH (ref 11.5–15.5)
WBC: 6.3 10*3/uL (ref 4.0–10.5)

## 2019-01-14 LAB — HEPATIC FUNCTION PANEL
ALT: 13 U/L (ref 0–35)
AST: 14 U/L (ref 0–37)
Albumin: 4.5 g/dL (ref 3.5–5.2)
Alkaline Phosphatase: 53 U/L (ref 39–117)
Bilirubin, Direct: 0.1 mg/dL (ref 0.0–0.3)
Total Bilirubin: 0.4 mg/dL (ref 0.2–1.2)
Total Protein: 7.5 g/dL (ref 6.0–8.3)

## 2019-01-14 LAB — BASIC METABOLIC PANEL
BUN: 22 mg/dL (ref 6–23)
CO2: 29 mEq/L (ref 19–32)
Calcium: 9.6 mg/dL (ref 8.4–10.5)
Chloride: 103 mEq/L (ref 96–112)
Creatinine, Ser: 0.97 mg/dL (ref 0.40–1.20)
GFR: 59.68 mL/min — ABNORMAL LOW (ref >60.00)
Glucose, Bld: 98 mg/dL (ref 70–99)
Potassium: 3.9 mEq/L (ref 3.5–5.1)
Sodium: 139 mEq/L (ref 135–145)

## 2019-01-14 MED ORDER — DONEPEZIL HCL 10 MG PO TABS: 10.0000 mg | ORAL_TABLET | Freq: Every day | ORAL | 3 refills | Status: DC

## 2019-01-14 MED ORDER — LIDOCAINE 5 % EX PTCH: 1.0000 | MEDICATED_PATCH | CUTANEOUS | 1 refills | Status: DC

## 2019-01-14 MED ORDER — DULOXETINE HCL 60 MG PO CPEP: 60.0000 mg | ORAL_CAPSULE | Freq: Every day | ORAL | 1 refills | Status: DC

## 2019-01-14 MED ORDER — MELOXICAM 15 MG PO TABS: ORAL_TABLET | ORAL | 1 refills | Status: DC

## 2019-01-14 MED ORDER — BUSPIRONE HCL 5 MG PO TABS: 5.0000 mg | ORAL_TABLET | Freq: Two times a day (BID) | ORAL | 1 refills | Status: DC

## 2019-01-14 MED ORDER — FAMOTIDINE 40 MG PO TABS: 40.0000 mg | ORAL_TABLET | Freq: Every day | ORAL | 3 refills | Status: DC

## 2019-01-14 MED ORDER — LAMOTRIGINE 100 MG PO TABS: ORAL_TABLET | ORAL | 1 refills | Status: DC

## 2019-01-14 MED ORDER — ATORVASTATIN CALCIUM 20 MG PO TABS: 20.0000 mg | ORAL_TABLET | Freq: Every day | ORAL | 3 refills | Status: DC

## 2019-01-14 NOTE — Assessment & Plan Note (Signed)
Cymbalta, Buspar

## 2019-01-14 NOTE — Assessment & Plan Note (Signed)
S/p thalamic CVA  Potential benefits of a long term opioids use as well as potential risks (i.e. addiction risk, apnea etc) and complications (i.e. Somnolence, constipation and others) were explained to the patient and were aknowledged.

## 2019-01-14 NOTE — Progress Notes (Signed)
Subjective:  Patient ID: Samantha [name], female    DOB: May 11, 1966  Age: 53 y.o. MRN: [id number]  CC: No chief complaint on file.   HPI Samantha [name] presents for chronic pain, depression, CFS f/u  Outpatient Medications Prior to Visit  Medication Sig Dispense Refill  . aspirin 81 MG tablet Take 81 mg by mouth daily.    Marland Kitchen atorvastatin (LIPITOR) 20 MG tablet Take 1 tablet (20 mg total) by mouth daily at 6 PM. 30 tablet 2  . busPIRone (BUSPAR) 5 MG tablet Take 1 tablet (5 mg total) by mouth 2 (two) times daily. 60 tablet 5  . Calcium Carbonate-Vitamin D (CALCIUM-VITAMIN D) 500-200 MG-UNIT per tablet Take 1 tablet by mouth 2 (two) times daily with a meal.    . Docusate Calcium (STOOL SOFTENER PO) Take by mouth 2 (two) times daily.    Marland Kitchen donepezil (ARICEPT) 10 MG tablet Take 1 tablet (10 mg total) by mouth at bedtime. 90 tablet 3  . DULoxetine (CYMBALTA) 60 MG capsule Take 1 capsule (60 mg total) by mouth daily. 90 capsule 0  . gabapentin (NEURONTIN) 300 MG capsule Take 300 mg by mouth 3 (three) times daily.    Marland Kitchen lactulose (CHRONULAC) 10 GM/15ML solution Take 30-45 mLs (20-30 g total) by mouth 2 (two) times daily as needed for moderate constipation or severe constipation. 946 mL 5  . lamoTRIgine (LAMICTAL) 100 MG tablet TAKE 1 TABLET EVERY MORNING, AND 1 & 1/2 TABLETS AT NIGHT. NO MORE REFILLS UNTIL OFFICE VISIT. 225 tablet 1  . lidocaine (LIDODERM) 5 % Place 1 patch onto the skin daily. Remove & Discard patch within 12 hours or as directed by MD 60 patch 0  . meloxicam (MOBIC) 15 MG tablet TAKE 1/2 TO 1 TABLET BY MOUTH DAILY AS NEEDED FOR PAIN 30 tablet 0  . naloxone (NARCAN) nasal spray 4 mg/0.1 mL Place into the nose.    Marland Kitchen Oxycodone HCl 20 MG TABS Take 1 tablet by mouth every 6 (six) hours.    . ranitidine (ZANTAC) 150 MG tablet TAKE 1 TABLET BY MOUTH TWICE A DAY X 2 WEEKS,THEN ONCE DAILY 60 tablet 0   No facility-administered medications prior to visit.     ROS: Review of Systems    Constitutional: Positive for fatigue. Negative for activity change, appetite change, chills and unexpected weight change.  HENT: Negative for congestion, mouth sores and sinus pressure.   Eyes: Negative for visual disturbance.  Respiratory: Negative for cough and chest tightness.   Gastrointestinal: Negative for abdominal pain and nausea.  Genitourinary: Negative for difficulty urinating, frequency and vaginal pain.  Musculoskeletal: Positive for arthralgias, back pain and gait problem.  Skin: Negative for pallor and rash.  Neurological: Positive for weakness. Negative for dizziness, tremors, numbness and headaches.  Psychiatric/Behavioral: Positive for dysphoric mood. Negative for confusion, sleep disturbance and suicidal ideas. The patient is nervous/anxious.     Objective:  BP 134/86 (BP Location: Left Arm, Patient Position: Sitting, Cuff Size: Normal)   Pulse 94   Temp 98.2 F (36.8 C) (Oral)   Ht 4\' 10"  (1.473 m)   Wt 140 lb (63.5 kg)   SpO2 98%   BMI 29.26 kg/m   BP Readings from Last 3 Encounters:  01/14/19 134/86  08/30/18 (!) 162/95  07/18/18 138/82    Wt Readings from Last 3 Encounters:  01/14/19 140 lb (63.5 kg)  07/18/18 144 lb (65.3 kg)  07/18/18 144 lb 6.4 oz (65.5 kg)    Physical  Exam Constitutional:      General: She is not in acute distress.    Appearance: She is well-developed.  HENT:     Head: Normocephalic.     Right Ear: External ear normal.     Left Ear: External ear normal.     Nose: Nose normal.  Eyes:     General:        Right eye: No discharge.        Left eye: No discharge.     Conjunctiva/sclera: Conjunctivae normal.     Pupils: Pupils are equal, round, and reactive to light.  Neck:     Musculoskeletal: Normal range of motion and neck supple.     Thyroid: No thyromegaly.     Vascular: No JVD.     Trachea: No tracheal deviation.  Cardiovascular:     Rate and Rhythm: Normal rate and regular rhythm.     Heart sounds: Normal heart  sounds.  Pulmonary:     Effort: No respiratory distress.     Breath sounds: No stridor. No wheezing.  Abdominal:     General: Bowel sounds are normal. There is no distension.     Palpations: Abdomen is soft. There is no mass.     Tenderness: There is no abdominal tenderness. There is no guarding or rebound.  Musculoskeletal:        General: No tenderness.  Lymphadenopathy:     Cervical: No cervical adenopathy.  Skin:    Findings: No erythema or rash.  Neurological:     Cranial Nerves: No cranial nerve deficit.     Motor: No abnormal muscle tone.     Coordination: Coordination normal.     Deep Tendon Reflexes: Reflexes normal.  Psychiatric:        Behavior: Behavior normal.        Thought Content: Thought content normal.        Judgment: Judgment normal.   abd scars No hernias palpable B  Lab Results  Component Value Date   WBC 8.3 08/29/2018   HGB 9.9 (L) 08/29/2018   HCT 33.1 (L) 08/29/2018   PLT 488 (H) 08/29/2018   GLUCOSE 100 (H) 08/29/2018   CHOL 205 (H) 12/26/2016   TRIG 102.0 12/26/2016   HDL 52.40 12/26/2016   LDLDIRECT 207.7 05/02/2011   LDLCALC 133 (H) 12/26/2016   ALT 13 12/26/2016   AST 17 12/26/2016   NA 141 08/29/2018   K 4.0 08/29/2018   CL 108 08/29/2018   CREATININE 1.25 (H) 08/29/2018   BUN 14 08/29/2018   CO2 24 08/29/2018   TSH 2.66 12/26/2016   HGBA1C 5.8 08/29/2014    Dg Chest 2 View  Result Date: 08/29/2018 CLINICAL DATA:  Chest pain, anxiety, emotional lability. Began a new medication last week. Patient was recently awakened with shortness of breath and left arm pain. EXAM: CHEST - 2 VIEW COMPARISON:  Portable chest x-ray of October 21, 2015 FINDINGS: The lungs are adequately inflated and clear. The heart is top-normal in size. The pulmonary vascularity is not engorged. The mediastinum is normal in width. There is no pleural effusion. There is S shaped thoracolumbar scoliosis. There is mild multilevel degenerative disc disease of the  thoracic spine. There is a ventriculoperitoneal shunt to the right of midline. Its lower portion is faintly followed into the abdomen. IMPRESSION: Stable cardiomegaly.  There is no active cardiopulmonary disease. Electronically Signed   By: David  [place] M.D.   On: 08/29/2018 15:36    Assessment & Plan:  Diagnoses and all orders for this visit:  Decreased ambulation status     No orders of the defined types were placed in this encounter.    Follow-up: No follow-ups on file.  Sonda Primes, MD

## 2019-01-14 NOTE — Patient Instructions (Addendum)
Chair yoga Power naps

## 2019-01-14 NOTE — Assessment & Plan Note (Signed)
On Lipitor 

## 2019-01-14 NOTE — Assessment & Plan Note (Signed)
Discussed.

## 2019-01-15 LAB — IRON,TIBC AND FERRITIN PANEL
%SAT: 21 % (calc) (ref 16–45)
Ferritin: 4 ng/mL — ABNORMAL LOW (ref 16–232)
Iron: 90 ug/dL (ref 45–160)
TIBC: 427 mcg/dL (calc) (ref 250–450)

## 2019-01-21 DIAGNOSIS — M792 Neuralgia and neuritis, unspecified: Secondary | ICD-10-CM | POA: Diagnosis not present

## 2019-01-21 DIAGNOSIS — M5412 Radiculopathy, cervical region: Secondary | ICD-10-CM | POA: Diagnosis not present

## 2019-01-21 DIAGNOSIS — M5416 Radiculopathy, lumbar region: Secondary | ICD-10-CM | POA: Diagnosis not present

## 2019-01-21 DIAGNOSIS — M5136 Other intervertebral disc degeneration, lumbar region: Secondary | ICD-10-CM | POA: Diagnosis not present

## 2019-02-10 DIAGNOSIS — G4733 Obstructive sleep apnea (adult) (pediatric): Secondary | ICD-10-CM | POA: Diagnosis not present

## 2019-02-10 DIAGNOSIS — M6281 Muscle weakness (generalized): Secondary | ICD-10-CM | POA: Diagnosis not present

## 2019-02-14 DIAGNOSIS — Z7409 Other reduced mobility: Secondary | ICD-10-CM | POA: Diagnosis not present

## 2019-02-26 MED FILL — oxyCODONE HCL 20 MG TABS: 20 | 30 days supply | Qty: 120 | Fill #0

## 2019-03-12 DIAGNOSIS — M5136 Other intervertebral disc degeneration, lumbar region: Secondary | ICD-10-CM | POA: Diagnosis not present

## 2019-03-12 DIAGNOSIS — M792 Neuralgia and neuritis, unspecified: Secondary | ICD-10-CM | POA: Diagnosis not present

## 2019-03-12 DIAGNOSIS — M5412 Radiculopathy, cervical region: Secondary | ICD-10-CM | POA: Diagnosis not present

## 2019-03-12 DIAGNOSIS — M5416 Radiculopathy, lumbar region: Secondary | ICD-10-CM | POA: Diagnosis not present

## 2019-04-16 ENCOUNTER — Ambulatory Visit (INDEPENDENT_AMBULATORY_CARE_PROVIDER_SITE_OTHER): Payer: Medicare HMO | Admitting: Internal Medicine

## 2019-04-16 ENCOUNTER — Encounter: Payer: Self-pay | Admitting: Internal Medicine

## 2019-04-16 DIAGNOSIS — F4323 Adjustment disorder with mixed anxiety and depressed mood: Secondary | ICD-10-CM | POA: Diagnosis not present

## 2019-04-16 DIAGNOSIS — E785 Hyperlipidemia, unspecified: Secondary | ICD-10-CM | POA: Diagnosis not present

## 2019-04-16 DIAGNOSIS — M545 Low back pain, unspecified: Secondary | ICD-10-CM

## 2019-04-16 DIAGNOSIS — M79605 Pain in left leg: Secondary | ICD-10-CM | POA: Diagnosis not present

## 2019-04-16 DIAGNOSIS — F419 Anxiety disorder, unspecified: Secondary | ICD-10-CM | POA: Diagnosis not present

## 2019-04-16 MED ORDER — BUSPIRONE HCL 10 MG PO TABS: 10.0000 mg | ORAL_TABLET | Freq: Three times a day (TID) | ORAL | 1 refills | Status: DC

## 2019-04-16 NOTE — Assessment & Plan Note (Signed)
Continue with pain management per pain clinic

## 2019-04-16 NOTE — Assessment & Plan Note (Signed)
Continue with Cymbalta.  BuSpar dose was increased

## 2019-04-16 NOTE — Assessment & Plan Note (Signed)
Worse, likely due to house confinement.  Monitor home ABA aggravating her OCD traits and irritability.  Increase BuSpar to 10 mg 3 times a day Samantha [name] will try to get out of the house more practicing social distancing

## 2019-04-16 NOTE — Assessment & Plan Note (Signed)
Continue with atorvastatin 

## 2019-04-16 NOTE — Progress Notes (Signed)
Virtual Visit via Video Note  I connected with Samantha [name] on 04/16/19 at  4:00 PM EDT by a video enabled telemedicine application and verified that I am speaking with the correct person using two identifiers.   I discussed the limitations of evaluation and management by telemedicine and the availability of in person appointments. The patient expressed understanding and agreed to proceed.  History of Present Illness: We need to follow-up on anxiety - worse, chronic pain, dyslipidemia, depression f/u  There has been no runny nose, cough, chest pain, shortness of breath, abdominal pain, diarrhea, constipation, skin rashes.   Observations/Objective: The patient appears to be in no acute distress, looks well.  Assessment and Plan:  See my Assessment and Plan. Follow Up Instructions:    I discussed the assessment and treatment plan with the patient. The patient was provided an opportunity to ask questions and all were answered. The patient agreed with the plan and demonstrated an understanding of the instructions.   The patient was advised to call back or seek an in-person evaluation if the symptoms worsen or if the condition fails to improve as anticipated.  I provided face-to-face time during this encounter. We were at different locations.   Sonda Primes, MD

## 2019-04-17 DIAGNOSIS — M5416 Radiculopathy, lumbar region: Secondary | ICD-10-CM | POA: Diagnosis not present

## 2019-04-17 DIAGNOSIS — M5412 Radiculopathy, cervical region: Secondary | ICD-10-CM | POA: Diagnosis not present

## 2019-04-17 DIAGNOSIS — M5136 Other intervertebral disc degeneration, lumbar region: Secondary | ICD-10-CM | POA: Diagnosis not present

## 2019-04-17 DIAGNOSIS — M792 Neuralgia and neuritis, unspecified: Secondary | ICD-10-CM | POA: Diagnosis not present

## 2019-05-08 DIAGNOSIS — G4733 Obstructive sleep apnea (adult) (pediatric): Secondary | ICD-10-CM | POA: Diagnosis not present

## 2019-05-27 DIAGNOSIS — M5416 Radiculopathy, lumbar region: Secondary | ICD-10-CM | POA: Diagnosis not present

## 2019-05-27 DIAGNOSIS — M792 Neuralgia and neuritis, unspecified: Secondary | ICD-10-CM | POA: Diagnosis not present

## 2019-05-27 DIAGNOSIS — M5136 Other intervertebral disc degeneration, lumbar region: Secondary | ICD-10-CM | POA: Diagnosis not present

## 2019-05-27 DIAGNOSIS — Z79899 Other long term (current) drug therapy: Secondary | ICD-10-CM | POA: Diagnosis not present

## 2019-05-27 DIAGNOSIS — Z5181 Encounter for therapeutic drug level monitoring: Secondary | ICD-10-CM | POA: Diagnosis not present

## 2019-05-27 DIAGNOSIS — M5412 Radiculopathy, cervical region: Secondary | ICD-10-CM | POA: Diagnosis not present

## 2019-06-10 ENCOUNTER — Other Ambulatory Visit: Payer: Self-pay | Admitting: Internal Medicine

## 2019-06-13 ENCOUNTER — Other Ambulatory Visit: Payer: Self-pay | Admitting: Internal Medicine

## 2019-06-26 DIAGNOSIS — M792 Neuralgia and neuritis, unspecified: Secondary | ICD-10-CM | POA: Diagnosis not present

## 2019-06-26 DIAGNOSIS — M5416 Radiculopathy, lumbar region: Secondary | ICD-10-CM | POA: Diagnosis not present

## 2019-06-26 DIAGNOSIS — M5136 Other intervertebral disc degeneration, lumbar region: Secondary | ICD-10-CM | POA: Diagnosis not present

## 2019-06-26 DIAGNOSIS — M5412 Radiculopathy, cervical region: Secondary | ICD-10-CM | POA: Diagnosis not present

## 2019-06-28 ENCOUNTER — Other Ambulatory Visit: Payer: Self-pay | Admitting: Internal Medicine

## 2019-07-13 ENCOUNTER — Other Ambulatory Visit: Payer: Self-pay | Admitting: Internal Medicine

## 2019-08-21 ENCOUNTER — Other Ambulatory Visit: Payer: Self-pay | Admitting: Internal Medicine

## 2019-08-21 DIAGNOSIS — M5136 Other intervertebral disc degeneration, lumbar region: Secondary | ICD-10-CM | POA: Diagnosis not present

## 2019-08-21 DIAGNOSIS — M792 Neuralgia and neuritis, unspecified: Secondary | ICD-10-CM | POA: Diagnosis not present

## 2019-08-21 DIAGNOSIS — M5416 Radiculopathy, lumbar region: Secondary | ICD-10-CM | POA: Diagnosis not present

## 2019-08-21 DIAGNOSIS — M5412 Radiculopathy, cervical region: Secondary | ICD-10-CM | POA: Diagnosis not present

## 2019-09-17 DIAGNOSIS — M792 Neuralgia and neuritis, unspecified: Secondary | ICD-10-CM | POA: Diagnosis not present

## 2019-09-17 DIAGNOSIS — M5412 Radiculopathy, cervical region: Secondary | ICD-10-CM | POA: Diagnosis not present

## 2019-09-17 DIAGNOSIS — M5136 Other intervertebral disc degeneration, lumbar region: Secondary | ICD-10-CM | POA: Diagnosis not present

## 2019-09-17 DIAGNOSIS — M5416 Radiculopathy, lumbar region: Secondary | ICD-10-CM | POA: Diagnosis not present

## 2019-09-18 DIAGNOSIS — G4733 Obstructive sleep apnea (adult) (pediatric): Secondary | ICD-10-CM | POA: Diagnosis not present

## 2019-09-19 ENCOUNTER — Telehealth: Payer: Self-pay | Admitting: Internal Medicine

## 2019-09-19 NOTE — Telephone Encounter (Signed)
Called and spoke requesting a new prescription to be sent to [facility] for cpap supplies.   Patient was last seen in office by Eustaquio Maize, NP 07/18/2018. Patient is scheduled with Beth, NP 09/20/19, at 2pm. Nothing further at this time.

## 2019-09-20 ENCOUNTER — Telehealth: Payer: Medicare HMO | Admitting: Primary Care

## 2019-09-20 ENCOUNTER — Encounter: Payer: Self-pay | Admitting: Primary Care

## 2019-09-20 ENCOUNTER — Telehealth (INDEPENDENT_AMBULATORY_CARE_PROVIDER_SITE_OTHER): Payer: Medicare HMO | Admitting: Primary Care

## 2019-09-20 DIAGNOSIS — Z9989 Dependence on other enabling machines and devices: Secondary | ICD-10-CM

## 2019-09-20 DIAGNOSIS — G4733 Obstructive sleep apnea (adult) (pediatric): Secondary | ICD-10-CM | POA: Diagnosis not present

## 2019-09-20 NOTE — Patient Instructions (Signed)
Recommendations: Continues CPAP every night for 4-6 hours or more Do not drive if experiencing excessive daytime fatigue or somnolence   Orders: Renew CPAP supplies with [facility]   Follow-up: 1 year with Dr. Annamaria Boots

## 2019-09-20 NOTE — Addendum Note (Signed)
Addended by: Lorretta Harp on: 09/20/2019 04:22 PM   Modules accepted: Orders

## 2019-09-20 NOTE — Progress Notes (Signed)
Virtual Visit via Telephone Note  I connected with Samantha [name] on 09/20/19 at  2:00 PM EST by telephone and verified that I am speaking with the correct person using two identifiers.  Location: Patient: Home Provider: Office   I discussed the limitations, risks, security and privacy concerns of performing an evaluation and management service by telephone and the availability of in person appointments. I also discussed with the patient that there may be a patient responsible charge related to this service. The patient expressed understanding and agreed to proceed.   History of Present Illness: 53 year female, never smoked. PMH significant for OSA on CPAP, seasonal allergic rhinitis. Patient of Dr. Annamaria Boots, last seen on 07/18/18. NPSG 03/08/15 at [facility] with full seizure montage- AHI 12.1/ hr, desaturation to 91%. Maintained on auto CPAP 5-15cm h20.   09/20/2019 Patient contacted today for video visit. She needs renewal order for CPAP supplies. Her DME company is [employer]. She is compliant with CPAP use. No issues with mask fit or pressure setting. She has moderate daytime fatigue which she attributes to her medications. Her husband does the driving. Epworth score 16 today. No acute complaints.  [facility] Download 10/7-11/5/20: Usage 30/30 days; 100% >4 hours Average use 11 hours 19 mins Pressure 5-15cm h20 (9.7cm h20- 95%) AHI 0.8   Observations/Objective:  - Appears well; no shortness of breath, wheezing or cough   Assessment and Plan:  OSA  - 100% complaint with CPAP and reports benefit from use - Pressure 5-15cm h20; AHI 0.8 - No changes to pressure - Renew CPAP supplies with [facility]  Follow Up Instructions:   - 1 year follow-up with Dr. Annamaria Boots  I discussed the assessment and treatment plan with the patient. The patient was provided an opportunity to ask questions and all were answered. The patient agreed with the plan and demonstrated an understanding of the instructions.    The patient was advised to call back or seek an in-person evaluation if the symptoms worsen or if the condition fails to improve as anticipated.  I provided 15 minutes of non-face-to-face time during this encounter.   Martyn Ehrich, NP

## 2019-09-20 NOTE — Addendum Note (Signed)
Addended by: Lorretta Harp on: 09/20/2019 03:48 PM   Modules accepted: Orders

## 2019-10-16 ENCOUNTER — Other Ambulatory Visit: Payer: Self-pay | Admitting: Internal Medicine

## 2019-10-16 NOTE — Telephone Encounter (Signed)
meloxicam (MOBIC) 15 MG tablet  Pt states that [employer] is tracking the script that Dr Mamie Nick called in for her and so far is lost in transit. They can not fill another due to no refills. They suggest she call Dr Mamie Nick and request another 15 to 20 day supply to last until they can get the one lost in transit rerouted. pls send this to  [place], [place] [address] [phone] (Phone) [phone] (Fax)

## 2019-10-16 NOTE — Telephone Encounter (Signed)
Requested medication (s) are due for refill today: no  Requested medication (s) are on the active medication list: yes  Last refill:  07/13/2019  Future visit scheduled: no  Notes to clinic:  Pt states that [facility] is tracking the script that Dr Mamie Nick called in for her and so far is lost in transit. They can not fill another due to no refills. They suggest she call Dr Mamie Nick and request another 15 to 20 day supply to last until they can get the one lost in transit rerouted.   Requested Prescriptions  Pending Prescriptions Disp Refills   meloxicam (MOBIC) 15 MG tablet 90 tablet 1     Analgesics:  COX2 Inhibitors Failed - 10/16/2019  3:06 PM      Failed - HGB in normal range and within 360 days    Hemoglobin  Date Value Ref Range Status  01/14/2019 11.2 (L) 12.0 - 15.0 g/dL Final         Passed - Cr in normal range and within 360 days    Creatinine, Ser  Date Value Ref Range Status  01/14/2019 0.97 0.40 - 1.20 mg/dL Final         Passed - Patient is not pregnant      Passed - Valid encounter within last 12 months    Recent Outpatient Visits          6 months ago Dyslipidemia   [place], MD   9 months ago Low back pain radiating to left lower extremity   [place], Evie Lacks, MD   1 year ago Chronic anxiety   [place], Evie Lacks, MD   2 years ago Neck pain, acute   [place], Evie Lacks, MD   2 years ago Neck pain, acute   [facility] -Georges Mouse, MD

## 2019-10-17 MED ORDER — MELOXICAM 15 MG PO TABS: ORAL_TABLET | ORAL | 1 refills | Status: DC

## 2019-11-09 ENCOUNTER — Other Ambulatory Visit: Payer: Self-pay | Admitting: Internal Medicine

## 2019-11-18 DIAGNOSIS — M5412 Radiculopathy, cervical region: Secondary | ICD-10-CM | POA: Diagnosis not present

## 2019-11-18 DIAGNOSIS — M5136 Other intervertebral disc degeneration, lumbar region: Secondary | ICD-10-CM | POA: Diagnosis not present

## 2019-11-18 DIAGNOSIS — M792 Neuralgia and neuritis, unspecified: Secondary | ICD-10-CM | POA: Diagnosis not present

## 2019-11-18 DIAGNOSIS — M5416 Radiculopathy, lumbar region: Secondary | ICD-10-CM | POA: Diagnosis not present

## 2019-11-20 ENCOUNTER — Telehealth: Payer: Self-pay | Admitting: Internal Medicine

## 2019-11-27 MED ORDER — LAMOTRIGINE 100 MG PO TABS: ORAL_TABLET | ORAL | 1 refills | Status: DC

## 2019-11-27 NOTE — Addendum Note (Signed)
Addended by: Scarlett Presto on: 11/27/2019 04:39 PM   Modules accepted: Orders

## 2019-11-27 NOTE — Telephone Encounter (Signed)
Patient called in stating that pharmacy states they still do not have script. Please advise and resend.

## 2019-11-27 NOTE — Telephone Encounter (Signed)
done

## 2019-12-15 ENCOUNTER — Other Ambulatory Visit: Payer: Self-pay | Admitting: Internal Medicine

## 2020-01-20 DIAGNOSIS — M5136 Other intervertebral disc degeneration, lumbar region: Secondary | ICD-10-CM | POA: Diagnosis not present

## 2020-01-20 DIAGNOSIS — M5412 Radiculopathy, cervical region: Secondary | ICD-10-CM | POA: Diagnosis not present

## 2020-01-20 DIAGNOSIS — M792 Neuralgia and neuritis, unspecified: Secondary | ICD-10-CM | POA: Diagnosis not present

## 2020-01-20 DIAGNOSIS — M5416 Radiculopathy, lumbar region: Secondary | ICD-10-CM | POA: Diagnosis not present

## 2020-01-22 ENCOUNTER — Other Ambulatory Visit: Payer: Self-pay | Admitting: Internal Medicine

## 2020-01-22 ENCOUNTER — Telehealth: Payer: Self-pay

## 2020-01-22 NOTE — Telephone Encounter (Signed)
New message    The patient call wanted to know if Dr. Posey Rea would prescribe probiotics for her -----reason is for constipation   [facility] [address]

## 2020-01-23 MED ORDER — ALIGN 4 MG PO CAPS: 1.0000 | ORAL_CAPSULE | Freq: Every day | ORAL | 1 refills | Status: DC

## 2020-01-23 NOTE — Telephone Encounter (Signed)
Align 1 a day Thx

## 2020-01-23 NOTE — Telephone Encounter (Signed)
Notified pt with MD response. Pt is also wanting to know will MD send Linzess, and do a PA for med. She states nothing over the counter works and since she change insurance they will not cover without PA.Marland KitchenRaechel Chute

## 2020-01-26 MED ORDER — LINACLOTIDE 290 MCG PO CAPS: 290.0000 ug | ORAL_CAPSULE | Freq: Every day | ORAL | 3 refills | Status: DC

## 2020-01-26 NOTE — Telephone Encounter (Signed)
Okay Linzess.  Schedule office visit with me.  Thanks

## 2020-01-27 NOTE — Telephone Encounter (Signed)
Notified pt MD sent rx to [employer].../lmb 

## 2020-01-28 NOTE — Telephone Encounter (Signed)
    Patient calling for status on prior auth for linaclotide Karlene Einstein)

## 2020-01-30 ENCOUNTER — Telehealth: Payer: Self-pay

## 2020-01-30 NOTE — Telephone Encounter (Signed)
LMTCB

## 2020-01-30 NOTE — Telephone Encounter (Signed)
New message    The patient has appeal phone # [phone] for [facility] regarding linaclotide (LINZESS) 290 MCG CAPS capsule    Asking for a call back from the nurse.

## 2020-01-30 NOTE — Telephone Encounter (Signed)
° ° °  Please return call to patient °

## 2020-01-30 NOTE — Telephone Encounter (Signed)
Pt notified will work on Radio producer

## 2020-02-06 DIAGNOSIS — G4733 Obstructive sleep apnea (adult) (pediatric): Secondary | ICD-10-CM | POA: Diagnosis not present

## 2020-02-07 NOTE — Telephone Encounter (Signed)
Tier exception sent: Key: [id number]

## 2020-02-28 ENCOUNTER — Other Ambulatory Visit: Payer: Self-pay | Admitting: Internal Medicine

## 2020-03-18 DIAGNOSIS — M5136 Other intervertebral disc degeneration, lumbar region: Secondary | ICD-10-CM | POA: Diagnosis not present

## 2020-03-18 DIAGNOSIS — M792 Neuralgia and neuritis, unspecified: Secondary | ICD-10-CM | POA: Diagnosis not present

## 2020-03-18 DIAGNOSIS — M5412 Radiculopathy, cervical region: Secondary | ICD-10-CM | POA: Diagnosis not present

## 2020-03-18 DIAGNOSIS — M5416 Radiculopathy, lumbar region: Secondary | ICD-10-CM | POA: Diagnosis not present

## 2020-03-30 ENCOUNTER — Other Ambulatory Visit: Payer: Self-pay | Admitting: Internal Medicine

## 2020-04-11 ENCOUNTER — Other Ambulatory Visit: Payer: Self-pay | Admitting: Internal Medicine

## 2020-04-13 NOTE — Telephone Encounter (Signed)
Needs office visit.

## 2020-04-15 DIAGNOSIS — Z79899 Other long term (current) drug therapy: Secondary | ICD-10-CM | POA: Diagnosis not present

## 2020-04-15 DIAGNOSIS — M47816 Spondylosis without myelopathy or radiculopathy, lumbar region: Secondary | ICD-10-CM | POA: Diagnosis not present

## 2020-04-15 DIAGNOSIS — M722 Plantar fascial fibromatosis: Secondary | ICD-10-CM | POA: Diagnosis not present

## 2020-04-15 DIAGNOSIS — Z5181 Encounter for therapeutic drug level monitoring: Secondary | ICD-10-CM | POA: Diagnosis not present

## 2020-04-15 DIAGNOSIS — M5136 Other intervertebral disc degeneration, lumbar region: Secondary | ICD-10-CM | POA: Diagnosis not present

## 2020-04-15 DIAGNOSIS — M5416 Radiculopathy, lumbar region: Secondary | ICD-10-CM | POA: Diagnosis not present

## 2020-04-16 ENCOUNTER — Ambulatory Visit: Payer: Medicare HMO | Admitting: Internal Medicine

## 2020-04-22 ENCOUNTER — Encounter: Payer: Self-pay | Admitting: Internal Medicine

## 2020-04-22 ENCOUNTER — Ambulatory Visit (INDEPENDENT_AMBULATORY_CARE_PROVIDER_SITE_OTHER): Payer: Medicare HMO | Admitting: Internal Medicine

## 2020-04-22 ENCOUNTER — Other Ambulatory Visit: Payer: Self-pay

## 2020-04-22 VITALS — BP 160/102 | HR 94 | Temp 98.6°F | Ht <= 58 in | Wt 142.0 lb

## 2020-04-22 DIAGNOSIS — F4323 Adjustment disorder with mixed anxiety and depressed mood: Secondary | ICD-10-CM | POA: Diagnosis not present

## 2020-04-22 DIAGNOSIS — R413 Other amnesia: Secondary | ICD-10-CM | POA: Diagnosis not present

## 2020-04-22 DIAGNOSIS — M545 Low back pain, unspecified: Secondary | ICD-10-CM

## 2020-04-22 DIAGNOSIS — E785 Hyperlipidemia, unspecified: Secondary | ICD-10-CM

## 2020-04-22 DIAGNOSIS — R202 Paresthesia of skin: Secondary | ICD-10-CM

## 2020-04-22 DIAGNOSIS — F419 Anxiety disorder, unspecified: Secondary | ICD-10-CM | POA: Diagnosis not present

## 2020-04-22 DIAGNOSIS — E559 Vitamin D deficiency, unspecified: Secondary | ICD-10-CM

## 2020-04-22 DIAGNOSIS — D509 Iron deficiency anemia, unspecified: Secondary | ICD-10-CM

## 2020-04-22 DIAGNOSIS — G914 Hydrocephalus in diseases classified elsewhere: Secondary | ICD-10-CM

## 2020-04-22 DIAGNOSIS — M79605 Pain in left leg: Secondary | ICD-10-CM

## 2020-04-22 MED ORDER — FAMOTIDINE 40 MG PO TABS: 40.0000 mg | ORAL_TABLET | Freq: Every day | ORAL | 3 refills | Status: DC

## 2020-04-22 MED ORDER — BUSPIRONE HCL 10 MG PO TABS: 10.0000 mg | ORAL_TABLET | Freq: Three times a day (TID) | ORAL | 3 refills | Status: DC

## 2020-04-22 MED ORDER — LINACLOTIDE 290 MCG PO CAPS: 290.0000 ug | ORAL_CAPSULE | Freq: Every day | ORAL | 3 refills | Status: DC

## 2020-04-22 MED ORDER — DONEPEZIL HCL 10 MG PO TABS: 10.0000 mg | ORAL_TABLET | Freq: Every day | ORAL | 3 refills | Status: DC

## 2020-04-22 MED ORDER — MELOXICAM 15 MG PO TABS: ORAL_TABLET | ORAL | 3 refills | Status: DC

## 2020-04-22 MED ORDER — ATORVASTATIN CALCIUM 20 MG PO TABS: ORAL_TABLET | ORAL | 3 refills | Status: DC

## 2020-04-22 MED ORDER — LAMOTRIGINE 100 MG PO TABS: ORAL_TABLET | ORAL | 1 refills | Status: DC

## 2020-04-22 MED ORDER — DULOXETINE HCL 60 MG PO CPEP: ORAL_CAPSULE | ORAL | 3 refills | Status: DC

## 2020-04-22 NOTE — Assessment & Plan Note (Signed)
Pain Clinic On Oxycodone and MS Contin Facet inj pending

## 2020-04-22 NOTE — Assessment & Plan Note (Signed)
F/u w/Dr [facility]

## 2020-04-22 NOTE — Assessment & Plan Note (Signed)
CBC

## 2020-04-22 NOTE — Assessment & Plan Note (Signed)
On Lipitor 

## 2020-04-22 NOTE — Progress Notes (Signed)
Subjective:  Patient ID: Samantha [name], female    DOB: 11/12/1966  Age: 54 y.o. MRN: [id number]  CC: No chief complaint on file.   HPI Samantha [name] presents for chronic pain, hydrocephalus, HTN, memory loss f/u  Outpatient Medications Prior to Visit  Medication Sig Dispense Refill  . aspirin 81 MG tablet Take 81 mg by mouth daily.    Marland Kitchen atorvastatin (LIPITOR) 20 MG tablet TAKE 1 TABLET EVERY DAY AT 6 PM 90 tablet 3  . busPIRone (BUSPAR) 10 MG tablet TAKE 1 TABLET THREE TIMES DAILY 270 tablet 0  . Calcium Carbonate-Vitamin D (CALCIUM-VITAMIN D) 500-200 MG-UNIT per tablet Take 1 tablet by mouth 2 (two) times daily with a meal.    . Docusate Calcium (STOOL SOFTENER PO) Take by mouth 2 (two) times daily.    Marland Kitchen donepezil (ARICEPT) 10 MG tablet TAKE 1 TABLET AT BEDTIME 90 tablet 3  . DULoxetine (CYMBALTA) 60 MG capsule TAKE 1 CAPSULE EVERY DAY (NEED MD APPOINTMENT) 30 capsule 0  . famotidine (PEPCID) 40 MG tablet TAKE 1 TABLET EVERY DAY (NEED MD APPOINTMENT) 30 tablet 0  . gabapentin (NEURONTIN) 300 MG capsule Take 300 mg by mouth 3 (three) times daily.    Marland Kitchen lactulose (CHRONULAC) 10 GM/15ML solution Take 30-45 mLs (20-30 g total) by mouth 2 (two) times daily as needed for moderate constipation or severe constipation. 946 mL 5  . lamoTRIgine (LAMICTAL) 100 MG tablet TAKE 1 TABLET EVERY MORNING, AND 1 AND 1/2 TABLETS AT NIGHT. NO MORE REFILLS UNTIL OFFICE VISIT. 225 tablet 1  . linaclotide (LINZESS) 290 MCG CAPS capsule Take 1 capsule (290 mcg total) by mouth daily before breakfast. Must keep appointment on 07/18/18 90 capsule 3  . meloxicam (MOBIC) 15 MG tablet TAKE 1/2 TO 1 TABLET DAILY AS NEEDED FOR PAIN Annual appt due in JUNE must see provider for future refills 90 tablet 0  . morphine (MS CONTIN) 30 MG 12 hr tablet Take by mouth.    . Oxycodone HCl 20 MG TABS Take 1 tablet by mouth every 6 (six) hours.    . lidocaine (LIDODERM) 5 % Place 1 patch onto the skin daily. Remove & Discard  patch within 12 hours or as directed by MD (Patient not taking: Reported on 04/22/2020) 180 patch 1  . naloxone (NARCAN) nasal spray 4 mg/0.1 mL Place into the nose.    . Probiotic Product (ALIGN) 4 MG CAPS Take 1 capsule (4 mg total) by mouth daily. (Patient not taking: Reported on 04/22/2020) 90 capsule 1   No facility-administered medications prior to visit.    ROS: Review of Systems  Constitutional: Positive for fatigue. Negative for activity change, appetite change, chills and unexpected weight change.  HENT: Negative for congestion, mouth sores and sinus pressure.   Eyes: Negative for visual disturbance.  Respiratory: Negative for cough and chest tightness.   Gastrointestinal: Negative for abdominal pain and nausea.  Genitourinary: Negative for difficulty urinating, frequency and vaginal pain.  Musculoskeletal: Positive for arthralgias, back pain and gait problem.  Skin: Negative for pallor and rash.  Neurological: Negative for dizziness, tremors, weakness, numbness and headaches.  Psychiatric/Behavioral: Positive for confusion and decreased concentration. Negative for sleep disturbance.    Objective:  BP (!) 160/102 (BP Location: Left Arm, Patient Position: Sitting, Cuff Size: Normal)   Pulse 94   Temp 98.6 F (37 C) (Oral)   Ht 4\' 10"  (1.473 m)   Wt 142 lb (64.4 kg)   SpO2 96%  BMI 29.68 kg/m   BP Readings from Last 3 Encounters:  04/22/20 (!) 160/102  01/14/19 134/86  08/30/18 (!) 162/95    Wt Readings from Last 3 Encounters:  04/22/20 142 lb (64.4 kg)  01/14/19 140 lb (63.5 kg)  07/18/18 144 lb (65.3 kg)    Physical Exam Constitutional:      General: She is not in acute distress.    Appearance: She is well-developed. She is obese.  HENT:     Head: Normocephalic.     Right Ear: External ear normal.     Left Ear: External ear normal.     Nose: Nose normal.  Eyes:     General:        Right eye: No discharge.        Left eye: No discharge.      Conjunctiva/sclera: Conjunctivae normal.     Pupils: Pupils are equal, round, and reactive to light.  Neck:     Thyroid: No thyromegaly.     Vascular: No JVD.     Trachea: No tracheal deviation.  Cardiovascular:     Rate and Rhythm: Normal rate and regular rhythm.     Heart sounds: Normal heart sounds.  Pulmonary:     Effort: No respiratory distress.     Breath sounds: No stridor. No wheezing.  Abdominal:     General: Bowel sounds are normal. There is no distension.     Palpations: Abdomen is soft. There is no mass.     Tenderness: There is no abdominal tenderness. There is no guarding or rebound.  Musculoskeletal:        General: Tenderness present.     Cervical back: Normal range of motion and neck supple.  Lymphadenopathy:     Cervical: No cervical adenopathy.  Skin:    Findings: No erythema or rash.  Neurological:     Cranial Nerves: No cranial nerve deficit.     Motor: No abnormal muscle tone.     Coordination: Coordination abnormal.     Deep Tendon Reflexes: Reflexes normal.  Psychiatric:        Behavior: Behavior normal.        Thought Content: Thought content normal.        Judgment: Judgment normal.   LS tender  A complex case Visit/ discussion 45 min: memory issues, chronic pain  Lab Results  Component Value Date   WBC 6.3 01/14/2019   HGB 11.2 (L) 01/14/2019   HCT 34.2 (L) 01/14/2019   PLT 450.0 (H) 01/14/2019   GLUCOSE 98 01/14/2019   CHOL 205 (H) 12/26/2016   TRIG 102.0 12/26/2016   HDL 52.40 12/26/2016   LDLDIRECT 207.7 05/02/2011   LDLCALC 133 (H) 12/26/2016   ALT 13 01/14/2019   AST 14 01/14/2019   NA 139 01/14/2019   K 3.9 01/14/2019   CL 103 01/14/2019   CREATININE 0.97 01/14/2019   BUN 22 01/14/2019   CO2 29 01/14/2019   TSH 2.66 12/26/2016   HGBA1C 5.8 08/29/2014    DG Chest 2 View  Result Date: 08/29/2018 CLINICAL DATA:  Chest pain, anxiety, emotional lability. Began a new medication last week. Patient was recently awakened with  shortness of breath and left arm pain. EXAM: CHEST - 2 VIEW COMPARISON:  Portable chest x-ray of October 21, 2015 FINDINGS: The lungs are adequately inflated and clear. The heart is top-normal in size. The pulmonary vascularity is not engorged. The mediastinum is normal in width. There is no pleural effusion. There is S  shaped thoracolumbar scoliosis. There is mild multilevel degenerative disc disease of the thoracic spine. There is a ventriculoperitoneal shunt to the right of midline. Its lower portion is faintly followed into the abdomen. IMPRESSION: Stable cardiomegaly.  There is no active cardiopulmonary disease. Electronically Signed   By: David  [place] M.D.   On: 08/29/2018 15:36    Assessment & Plan:    Follow-up: No follow-ups on file.  Sonda Primes, MD

## 2020-04-22 NOTE — Assessment & Plan Note (Signed)
On Cymbalta, BuSpar  

## 2020-04-22 NOTE — Assessment & Plan Note (Signed)
On Aricept - 10 mg/d H/o Alzheimer's in the family

## 2020-04-22 NOTE — Assessment & Plan Note (Signed)
F/u w/Dr Yan 

## 2020-04-27 DIAGNOSIS — M47816 Spondylosis without myelopathy or radiculopathy, lumbar region: Secondary | ICD-10-CM | POA: Diagnosis not present

## 2020-05-06 ENCOUNTER — Other Ambulatory Visit: Payer: Self-pay | Admitting: Internal Medicine

## 2020-05-29 ENCOUNTER — Telehealth: Payer: Self-pay | Admitting: Internal Medicine

## 2020-05-29 NOTE — Telephone Encounter (Signed)
New message:   Pt would like to know if Dr. Macario Golds can adjust her depression medication. I have advised the pt that the Dr is not in the office today. Please advise.

## 2020-05-31 NOTE — Telephone Encounter (Signed)
[name] should see Dr.Yan for an office visit visit to discuss her meds increase/change.  There is a high potential for drug to drug interaction. Thanks

## 2020-06-01 NOTE — Telephone Encounter (Signed)
Left pt informing her of below

## 2020-06-09 ENCOUNTER — Telehealth: Payer: Self-pay | Admitting: Internal Medicine

## 2020-06-09 DIAGNOSIS — G8929 Other chronic pain: Secondary | ICD-10-CM

## 2020-06-09 DIAGNOSIS — G914 Hydrocephalus in diseases classified elsewhere: Secondary | ICD-10-CM

## 2020-06-09 DIAGNOSIS — M549 Dorsalgia, unspecified: Secondary | ICD-10-CM

## 2020-06-09 DIAGNOSIS — M542 Cervicalgia: Secondary | ICD-10-CM

## 2020-06-09 NOTE — Telephone Encounter (Signed)
New message:   Pt is calling and states she is needing a referral to go see Dr. Claudette Laws. She states she is needing it for  pain management. She states they told her it has been 4 years since she was seen last and they don't know what she was seen for. She states if she can provide the reason for the visit the referral may not be needed. Please advise.

## 2020-06-10 DIAGNOSIS — M47816 Spondylosis without myelopathy or radiculopathy, lumbar region: Secondary | ICD-10-CM | POA: Diagnosis not present

## 2020-06-10 DIAGNOSIS — M5136 Other intervertebral disc degeneration, lumbar region: Secondary | ICD-10-CM | POA: Diagnosis not present

## 2020-06-10 DIAGNOSIS — M5416 Radiculopathy, lumbar region: Secondary | ICD-10-CM | POA: Diagnosis not present

## 2020-06-10 DIAGNOSIS — M792 Neuralgia and neuritis, unspecified: Secondary | ICD-10-CM | POA: Diagnosis not present

## 2020-06-12 NOTE — Telephone Encounter (Signed)
Okay.  Will do.  Thanks °

## 2020-06-12 NOTE — Addendum Note (Signed)
Addended by: Tresa Garter on: 06/12/2020 04:07 PM   Modules accepted: Orders

## 2020-06-16 ENCOUNTER — Institutional Professional Consult (permissible substitution): Payer: Medicare HMO | Admitting: Neurology

## 2020-06-16 ENCOUNTER — Encounter: Payer: Self-pay | Admitting: Neurology

## 2020-06-16 ENCOUNTER — Telehealth: Payer: Self-pay | Admitting: *Deleted

## 2020-06-16 NOTE — Telephone Encounter (Signed)
No showed consult appointment. 

## 2020-06-17 ENCOUNTER — Institutional Professional Consult (permissible substitution): Payer: Medicare HMO | Admitting: Neurology

## 2020-06-25 ENCOUNTER — Other Ambulatory Visit: Payer: Self-pay | Admitting: Obstetrics and Gynecology

## 2020-06-25 DIAGNOSIS — N6310 Unspecified lump in the right breast, unspecified quadrant: Secondary | ICD-10-CM

## 2020-07-09 ENCOUNTER — Other Ambulatory Visit: Payer: Self-pay

## 2020-07-09 ENCOUNTER — Ambulatory Visit
Admission: RE | Admit: 2020-07-09 | Discharge: 2020-07-09 | Disposition: A | Discharge status: Home / Self Care | Payer: Medicare HMO | Source: Ambulatory Visit | Attending: Obstetrics and Gynecology | Admitting: Obstetrics and Gynecology

## 2020-07-09 DIAGNOSIS — N6310 Unspecified lump in the right breast, unspecified quadrant: Secondary | ICD-10-CM

## 2020-07-09 DIAGNOSIS — N6489 Other specified disorders of breast: Secondary | ICD-10-CM | POA: Diagnosis not present

## 2020-07-09 DIAGNOSIS — R921 Mammographic calcification found on diagnostic imaging of breast: Secondary | ICD-10-CM | POA: Diagnosis not present

## 2020-07-16 ENCOUNTER — Other Ambulatory Visit (INDEPENDENT_AMBULATORY_CARE_PROVIDER_SITE_OTHER): Payer: Medicare HMO

## 2020-07-16 DIAGNOSIS — E559 Vitamin D deficiency, unspecified: Secondary | ICD-10-CM

## 2020-07-16 DIAGNOSIS — M545 Low back pain, unspecified: Secondary | ICD-10-CM

## 2020-07-16 DIAGNOSIS — D509 Iron deficiency anemia, unspecified: Secondary | ICD-10-CM | POA: Diagnosis not present

## 2020-07-16 DIAGNOSIS — R413 Other amnesia: Secondary | ICD-10-CM | POA: Diagnosis not present

## 2020-07-16 DIAGNOSIS — F419 Anxiety disorder, unspecified: Secondary | ICD-10-CM | POA: Diagnosis not present

## 2020-07-16 DIAGNOSIS — R202 Paresthesia of skin: Secondary | ICD-10-CM | POA: Diagnosis not present

## 2020-07-16 DIAGNOSIS — E785 Hyperlipidemia, unspecified: Secondary | ICD-10-CM | POA: Diagnosis not present

## 2020-07-16 DIAGNOSIS — F4323 Adjustment disorder with mixed anxiety and depressed mood: Secondary | ICD-10-CM

## 2020-07-16 DIAGNOSIS — M79605 Pain in left leg: Secondary | ICD-10-CM

## 2020-07-16 LAB — CBC WITH DIFFERENTIAL/PLATELET
Basophils Absolute: 0.1 10*3/uL (ref 0.0–0.1)
Basophils Relative: 0.9 % (ref 0.0–3.0)
Eosinophils Absolute: 0.4 10*3/uL (ref 0.0–0.7)
Eosinophils Relative: 6.1 % — ABNORMAL HIGH (ref 0.0–5.0)
HCT: 38.5 % (ref 36.0–46.0)
Hemoglobin: 13.1 g/dL (ref 12.0–15.0)
Lymphocytes Relative: 39.1 % (ref 12.0–46.0)
Lymphs Abs: 2.5 10*3/uL (ref 0.7–4.0)
MCHC: 34 g/dL (ref 30.0–36.0)
MCV: 94.5 fl (ref 78.0–100.0)
Monocytes Absolute: 0.4 10*3/uL (ref 0.1–1.0)
Monocytes Relative: 6.4 % (ref 3.0–12.0)
Neutro Abs: 3 10*3/uL (ref 1.4–7.7)
Neutrophils Relative %: 47.5 % (ref 43.0–77.0)
Platelets: 421 10*3/uL — ABNORMAL HIGH (ref 150.0–400.0)
RBC: 4.07 Mil/uL (ref 3.87–5.11)
RDW: 13 % (ref 11.5–15.5)
WBC: 6.3 10*3/uL (ref 4.0–10.5)

## 2020-07-16 LAB — VITAMIN D 25 HYDROXY (VIT D DEFICIENCY, FRACTURES): VITD: 47.24 ng/mL (ref 30.00–100.00)

## 2020-07-16 LAB — BASIC METABOLIC PANEL
BUN: 18 mg/dL (ref 6–23)
CO2: 31 mEq/L (ref 19–32)
Calcium: 10.4 mg/dL (ref 8.4–10.5)
Chloride: 100 mEq/L (ref 96–112)
Creatinine, Ser: 0.96 mg/dL (ref 0.40–1.20)
GFR: 60.06 mL/min (ref >60.00)
Glucose, Bld: 97 mg/dL (ref 70–99)
Potassium: 4.4 mEq/L (ref 3.5–5.1)
Sodium: 138 mEq/L (ref 135–145)

## 2020-07-16 LAB — LIPID PANEL
Cholesterol: 171 mg/dL (ref 0–200)
HDL: 52.5 mg/dL (ref >39.00)
LDL Cholesterol: 85 mg/dL (ref 0–99)
NonHDL: 118.17
Total CHOL/HDL Ratio: 3
Triglycerides: 168 mg/dL — ABNORMAL HIGH (ref 0.0–149.0)
VLDL: 33.6 mg/dL (ref 0.0–40.0)

## 2020-07-16 LAB — HEPATIC FUNCTION PANEL
ALT: 27 U/L (ref 0–35)
AST: 31 U/L (ref 0–37)
Albumin: 4.5 g/dL (ref 3.5–5.2)
Alkaline Phosphatase: 83 U/L (ref 39–117)
Bilirubin, Direct: 0.1 mg/dL (ref 0.0–0.3)
Total Bilirubin: 0.5 mg/dL (ref 0.2–1.2)
Total Protein: 7.5 g/dL (ref 6.0–8.3)

## 2020-07-16 LAB — HEMOGLOBIN A1C: Hgb A1c MFr Bld: 5.3 % (ref 4.6–6.5)

## 2020-07-16 LAB — TSH: TSH: 2.72 u[IU]/mL (ref 0.35–4.50)

## 2020-07-16 LAB — VITAMIN B12: Vitamin B-12: 425 pg/mL (ref 211–911)

## 2020-07-17 LAB — IRON,TIBC AND FERRITIN PANEL
%SAT: 34 % (calc) (ref 16–45)
Ferritin: 13 ng/mL — ABNORMAL LOW (ref 16–232)
Iron: 140 ug/dL (ref 45–160)
TIBC: 408 mcg/dL (calc) (ref 250–450)

## 2020-07-23 ENCOUNTER — Ambulatory Visit: Payer: Medicare HMO

## 2020-07-23 ENCOUNTER — Ambulatory Visit: Payer: Medicare HMO | Admitting: Internal Medicine

## 2020-08-05 DIAGNOSIS — M4184 Other forms of scoliosis, thoracic region: Secondary | ICD-10-CM | POA: Diagnosis not present

## 2020-08-05 DIAGNOSIS — M47816 Spondylosis without myelopathy or radiculopathy, lumbar region: Secondary | ICD-10-CM | POA: Diagnosis not present

## 2020-08-05 DIAGNOSIS — M5136 Other intervertebral disc degeneration, lumbar region: Secondary | ICD-10-CM | POA: Diagnosis not present

## 2020-08-05 DIAGNOSIS — M5416 Radiculopathy, lumbar region: Secondary | ICD-10-CM | POA: Diagnosis not present

## 2020-08-05 DIAGNOSIS — M419 Scoliosis, unspecified: Secondary | ICD-10-CM | POA: Diagnosis not present

## 2020-08-05 DIAGNOSIS — M5134 Other intervertebral disc degeneration, thoracic region: Secondary | ICD-10-CM | POA: Diagnosis not present

## 2020-08-05 DIAGNOSIS — M4185 Other forms of scoliosis, thoracolumbar region: Secondary | ICD-10-CM | POA: Diagnosis not present

## 2020-08-05 DIAGNOSIS — G894 Chronic pain syndrome: Secondary | ICD-10-CM | POA: Diagnosis not present

## 2020-08-17 ENCOUNTER — Ambulatory Visit: Payer: Medicare HMO | Admitting: Internal Medicine

## 2020-08-17 DIAGNOSIS — M4807 Spinal stenosis, lumbosacral region: Secondary | ICD-10-CM | POA: Diagnosis not present

## 2020-08-17 DIAGNOSIS — M47817 Spondylosis without myelopathy or radiculopathy, lumbosacral region: Secondary | ICD-10-CM | POA: Diagnosis not present

## 2020-08-17 DIAGNOSIS — M4186 Other forms of scoliosis, lumbar region: Secondary | ICD-10-CM | POA: Diagnosis not present

## 2020-08-17 DIAGNOSIS — M47816 Spondylosis without myelopathy or radiculopathy, lumbar region: Secondary | ICD-10-CM | POA: Diagnosis not present

## 2020-08-17 DIAGNOSIS — M48061 Spinal stenosis, lumbar region without neurogenic claudication: Secondary | ICD-10-CM | POA: Diagnosis not present

## 2020-08-17 DIAGNOSIS — M419 Scoliosis, unspecified: Secondary | ICD-10-CM | POA: Diagnosis not present

## 2020-08-18 ENCOUNTER — Institutional Professional Consult (permissible substitution): Payer: Medicare HMO | Admitting: Neurology

## 2020-08-26 ENCOUNTER — Encounter: Payer: Self-pay | Admitting: Physical Medicine & Rehabilitation

## 2020-09-10 ENCOUNTER — Ambulatory Visit: Payer: Medicare HMO | Admitting: Internal Medicine

## 2020-09-11 ENCOUNTER — Encounter: Payer: Self-pay | Admitting: Physical Medicine & Rehabilitation

## 2020-09-11 ENCOUNTER — Other Ambulatory Visit: Payer: Self-pay

## 2020-09-11 ENCOUNTER — Encounter: Payer: Medicare HMO | Attending: Physical Medicine & Rehabilitation | Admitting: Physical Medicine & Rehabilitation

## 2020-09-11 VITALS — BP 148/87 | HR 70 | Temp 98.9°F | Ht <= 58 in | Wt 148.0 lb

## 2020-09-11 DIAGNOSIS — M5442 Lumbago with sciatica, left side: Secondary | ICD-10-CM | POA: Diagnosis not present

## 2020-09-11 DIAGNOSIS — M5441 Lumbago with sciatica, right side: Secondary | ICD-10-CM

## 2020-09-11 DIAGNOSIS — G8929 Other chronic pain: Secondary | ICD-10-CM | POA: Insufficient documentation
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
## 2020-09-11 NOTE — Patient Instructions (Signed)
Lumbar spondylosis is arthritis of the spine and we use lumbar medial branch blocks to see if a radiofrequency neurotomy could be effective  This can be done for the knees as well  [facility] would be able to prescribe your meds at your current doses

## 2020-09-16 ENCOUNTER — Telehealth: Payer: Self-pay | Admitting: Internal Medicine

## 2020-09-16 DIAGNOSIS — G914 Hydrocephalus in diseases classified elsewhere: Secondary | ICD-10-CM

## 2020-09-16 DIAGNOSIS — M549 Dorsalgia, unspecified: Secondary | ICD-10-CM

## 2020-09-16 NOTE — Telephone Encounter (Signed)
Patient was seen by her pain management doctor and they did some scans of her back and found she had two fractured vertebrae and they wanted her to reach out to her PCP to see if she can get in with a neurosurgeon

## 2020-09-17 NOTE — Telephone Encounter (Signed)
Please schedule an office visit with me or one of my partners.  Bring reports of blood x-rays with you.  They are not on file.  Thanks

## 2020-09-17 NOTE — Telephone Encounter (Addendum)
  Follow up message  Patient made aware to bring reports with her to upcoming appt Also, she is requesting new referral to pain specialist Dr Percell Boston, phone [phone], fax [phone]

## 2020-09-19 NOTE — Addendum Note (Signed)
Addended by: Tresa Garter on: 09/19/2020 08:53 AM   Modules accepted: Orders

## 2020-09-19 NOTE — Telephone Encounter (Signed)
Okay.  Thanks.

## 2020-09-21 ENCOUNTER — Ambulatory Visit: Payer: Medicare HMO | Admitting: Internal Medicine

## 2020-09-28 ENCOUNTER — Ambulatory Visit: Payer: Medicare HMO | Admitting: Internal Medicine

## 2020-10-02 DIAGNOSIS — G4733 Obstructive sleep apnea (adult) (pediatric): Secondary | ICD-10-CM | POA: Diagnosis not present

## 2020-10-07 ENCOUNTER — Ambulatory Visit: Payer: Medicare HMO | Admitting: Internal Medicine

## 2020-10-14 DIAGNOSIS — M47816 Spondylosis without myelopathy or radiculopathy, lumbar region: Secondary | ICD-10-CM | POA: Diagnosis not present

## 2020-10-14 DIAGNOSIS — Z5181 Encounter for therapeutic drug level monitoring: Secondary | ICD-10-CM | POA: Diagnosis not present

## 2020-10-14 DIAGNOSIS — M792 Neuralgia and neuritis, unspecified: Secondary | ICD-10-CM | POA: Diagnosis not present

## 2020-10-14 DIAGNOSIS — M5416 Radiculopathy, lumbar region: Secondary | ICD-10-CM | POA: Diagnosis not present

## 2020-10-14 DIAGNOSIS — M5136 Other intervertebral disc degeneration, lumbar region: Secondary | ICD-10-CM | POA: Diagnosis not present

## 2020-10-14 DIAGNOSIS — Z79899 Other long term (current) drug therapy: Secondary | ICD-10-CM | POA: Diagnosis not present

## 2020-10-20 DIAGNOSIS — G959 Disease of spinal cord, unspecified: Secondary | ICD-10-CM | POA: Diagnosis not present

## 2020-10-20 DIAGNOSIS — M419 Scoliosis, unspecified: Secondary | ICD-10-CM | POA: Diagnosis not present

## 2020-10-20 DIAGNOSIS — R030 Elevated blood-pressure reading, without diagnosis of hypertension: Secondary | ICD-10-CM | POA: Diagnosis not present

## 2020-10-22 ENCOUNTER — Ambulatory Visit: Payer: Medicare HMO | Admitting: Neurology

## 2020-10-26 ENCOUNTER — Other Ambulatory Visit: Payer: Self-pay

## 2020-10-27 ENCOUNTER — Ambulatory Visit: Payer: Medicare HMO | Admitting: Internal Medicine

## 2020-11-03 ENCOUNTER — Ambulatory Visit: Payer: Medicare HMO | Admitting: Internal Medicine

## 2020-11-11 ENCOUNTER — Other Ambulatory Visit: Payer: Self-pay

## 2020-11-11 ENCOUNTER — Ambulatory Visit (INDEPENDENT_AMBULATORY_CARE_PROVIDER_SITE_OTHER): Payer: Medicare HMO | Admitting: Internal Medicine

## 2020-11-11 ENCOUNTER — Encounter: Payer: Self-pay | Admitting: Internal Medicine

## 2020-11-11 VITALS — BP 182/102 | HR 105 | Temp 98.4°F | Ht <= 58 in | Wt 149.0 lb

## 2020-11-11 DIAGNOSIS — M542 Cervicalgia: Secondary | ICD-10-CM | POA: Diagnosis not present

## 2020-11-11 DIAGNOSIS — M79605 Pain in left leg: Secondary | ICD-10-CM | POA: Diagnosis not present

## 2020-11-11 DIAGNOSIS — G914 Hydrocephalus in diseases classified elsewhere: Secondary | ICD-10-CM | POA: Diagnosis not present

## 2020-11-11 DIAGNOSIS — R413 Other amnesia: Secondary | ICD-10-CM

## 2020-11-11 DIAGNOSIS — L304 Erythema intertrigo: Secondary | ICD-10-CM | POA: Diagnosis not present

## 2020-11-11 DIAGNOSIS — M545 Low back pain, unspecified: Secondary | ICD-10-CM

## 2020-11-11 DIAGNOSIS — M549 Dorsalgia, unspecified: Secondary | ICD-10-CM | POA: Diagnosis not present

## 2020-11-11 DIAGNOSIS — G8929 Other chronic pain: Secondary | ICD-10-CM

## 2020-11-11 MED ORDER — KETOCONAZOLE 200 MG PO TABS: 200.0000 mg | ORAL_TABLET | Freq: Every day | ORAL | 0 refills | Status: DC

## 2020-11-11 MED ORDER — CLOTRIMAZOLE-BETAMETHASONE 1-0.05 % EX CREA: 1.0000 "application " | TOPICAL_CREAM | Freq: Two times a day (BID) | CUTANEOUS | 1 refills | Status: DC

## 2020-11-11 NOTE — Assessment & Plan Note (Signed)
Pt wants to switch pain clincs - she will go to [facility]

## 2020-11-11 NOTE — Progress Notes (Signed)
Subjective:  Patient ID: Samantha [name], female    DOB: Sep 17, 1966  Age: 54 y.o. MRN: [id number]  CC: No chief complaint on file.   HPI Samantha [name] presents for HTN, dyslipidemia, chronic pain - she wants to switch pain clincs - she will go to [facility] BP is ok at home C/o memory loss C/o rash under R>L breast  Outpatient Medications Prior to Visit  Medication Sig Dispense Refill  . aspirin 81 MG tablet Take 81 mg by mouth daily.    Marland Kitchen atorvastatin (LIPITOR) 20 MG tablet TAKE 1 TABLET EVERY DAY AT 6 PM 90 tablet 3  . busPIRone (BUSPAR) 10 MG tablet Take 1 tablet (10 mg total) by mouth 3 (three) times daily. 270 tablet 3  . Calcium Carbonate-Vitamin D (CALCIUM-VITAMIN D) 500-200 MG-UNIT per tablet Take 1 tablet by mouth 2 (two) times daily with a meal.    . Docusate Calcium (STOOL SOFTENER PO) Take by mouth 2 (two) times daily.    Marland Kitchen donepezil (ARICEPT) 10 MG tablet Take 1 tablet (10 mg total) by mouth at bedtime. 90 tablet 3  . DULoxetine (CYMBALTA) 60 MG capsule TAKE 1 CAPSULE EVERY DAY 90 capsule 1  . famotidine (PEPCID) 40 MG tablet Take 1 tablet (40 mg total) by mouth daily. 90 tablet 1  . gabapentin (NEURONTIN) 300 MG capsule Take 300 mg by mouth 3 (three) times daily.    Marland Kitchen lactulose (CHRONULAC) 10 GM/15ML solution Take 30-45 mLs (20-30 g total) by mouth 2 (two) times daily as needed for moderate constipation or severe constipation. 946 mL 5  . lamoTRIgine (LAMICTAL) 100 MG tablet TAKE 1 TABLET EVERY MORNING, AND 1 AND 1/2 TABLETS AT NIGHT. 225 tablet 1  . lidocaine (LIDODERM) 5 % Place 1 patch onto the skin daily. Remove & Discard patch within 12 hours or as directed by MD 180 patch 1  . linaclotide (LINZESS) 290 MCG CAPS capsule Take 1 capsule (290 mcg total) by mouth daily before breakfast. 90 capsule 3  . meloxicam (MOBIC) 15 MG tablet TAKE 1/2 TO 1 TABLET DAILY AS NEEDED FOR PAIN 90 tablet 3  . morphine (MS CONTIN) 30 MG 12 hr tablet Take 30 mg by mouth every 12  (twelve) hours.    . naloxone (NARCAN) nasal spray 4 mg/0.1 mL Place into the nose.    Marland Kitchen Oxycodone HCl 20 MG TABS Take 1 tablet by mouth every 6 (six) hours.    . Probiotic Product (ALIGN) 4 MG CAPS Take 1 capsule (4 mg total) by mouth daily. 90 capsule 1   No facility-administered medications prior to visit.    ROS: Review of Systems  Constitutional: Positive for fatigue. Negative for activity change, appetite change, chills and unexpected weight change.  HENT: Negative for congestion, mouth sores and sinus pressure.   Eyes: Negative for visual disturbance.  Respiratory: Negative for cough and chest tightness.   Gastrointestinal: Negative for abdominal pain and nausea.  Genitourinary: Negative for difficulty urinating, frequency and vaginal pain.  Musculoskeletal: Positive for arthralgias, back pain, gait problem, neck pain and neck stiffness.  Skin: Positive for rash. Negative for pallor.  Neurological: Negative for dizziness, tremors, weakness, numbness and headaches.  Psychiatric/Behavioral: Negative for confusion and sleep disturbance. The patient is nervous/anxious.     Objective:  BP (!) 182/102   Pulse (!) 105   Temp 98.4 F (36.9 C) (Oral)   Ht 4\' 10"  (1.473 m)   Wt 149 lb (67.6 kg)   SpO2  95%   BMI 31.14 kg/m   BP Readings from Last 3 Encounters:  11/11/20 (!) 182/102  09/11/20 (!) 148/87  04/22/20 (!) 160/102    Wt Readings from Last 3 Encounters:  11/11/20 149 lb (67.6 kg)  09/11/20 148 lb (67.1 kg)  04/22/20 142 lb (64.4 kg)    Physical Exam Constitutional:      General: She is not in acute distress.    Appearance: She is well-developed. She is obese.  HENT:     Head: Normocephalic.     Right Ear: External ear normal.     Left Ear: External ear normal.     Nose: Nose normal.     Mouth/Throat:     Mouth: Oropharynx is clear and moist.  Eyes:     General:        Right eye: No discharge.        Left eye: No discharge.     Conjunctiva/sclera:  Conjunctivae normal.     Pupils: Pupils are equal, round, and reactive to light.  Neck:     Thyroid: No thyromegaly.     Vascular: No JVD.     Trachea: No tracheal deviation.  Cardiovascular:     Rate and Rhythm: Normal rate and regular rhythm.     Heart sounds: Normal heart sounds.  Pulmonary:     Effort: No respiratory distress.     Breath sounds: No stridor. No wheezing.  Abdominal:     General: Bowel sounds are normal. There is no distension.     Palpations: Abdomen is soft. There is no mass.     Tenderness: There is no abdominal tenderness. There is no guarding or rebound.  Musculoskeletal:        General: Tenderness present. No edema.     Cervical back: Normal range of motion and neck supple.  Lymphadenopathy:     Cervical: No cervical adenopathy.  Skin:    Findings: Erythema and rash present.  Neurological:     Mental Status: She is oriented to person, place, and time.     Cranial Nerves: No cranial nerve deficit.     Motor: No abnormal muscle tone.     Coordination: Coordination normal.     Gait: Gait normal.     Deep Tendon Reflexes: Reflexes normal.  Psychiatric:        Mood and Affect: Mood and affect normal.        Behavior: Behavior normal.        Thought Content: Thought content normal.        Judgment: Judgment normal.   neck, LS spine is tender    Eryth rash under R>L breast  Lab Results  Component Value Date   WBC 6.3 07/16/2020   HGB 13.1 07/16/2020   HCT 38.5 07/16/2020   PLT 421.0 (H) 07/16/2020   GLUCOSE 97 07/16/2020   CHOL 171 07/16/2020   TRIG 168.0 (H) 07/16/2020   HDL 52.50 07/16/2020   LDLDIRECT 207.7 05/02/2011   LDLCALC 85 07/16/2020   ALT 27 07/16/2020   AST 31 07/16/2020   NA 138 07/16/2020   K 4.4 07/16/2020   CL 100 07/16/2020   CREATININE 0.96 07/16/2020   BUN 18 07/16/2020   CO2 31 07/16/2020   TSH 2.72 07/16/2020   HGBA1C 5.3 07/16/2020    US BREAST LTD UNI RIGHT INC AXILLA  Result Date: 07/09/2020 CLINICAL DATA:   54 year old female presenting for evaluation of 2 palpable lumps in the right breast at 7 o'clock.  EXAM: DIGITAL DIAGNOSTIC BILATERAL MAMMOGRAM WITH TOMO AND CAD; ULTRASOUND RIGHT BREAST LIMITED COMPARISON:  Previous exam(s). ACR Breast Density Category c: The breast tissue is heterogeneously dense, which may obscure small masses. FINDINGS: A BB has been placed at the palpable site of concern on the lower-inner quadrant of the right breast. No suspicious mammographic findings are identified deep to the palpable marker. The patient does have an peripherally calcified catheter fragment from a VP shunt more medial to the marker. No other suspicious calcifications, masses or areas of distortion are seen in the bilateral breasts. Mammographic images were processed with CAD. Ultrasound targeted to the palpable site in the right breast at 4 o'clock, 7 cm from the nipple demonstrates a tubular structure with peripheral calcifications compatible with the patient seen mammographically. No other masses or suspicious areas of shadowing are identified. IMPRESSION: 1. The palpable site in the medial right breast appears to correspond with the patient's VP shunt catheter fragment. No other mammographic or targeted sonographic abnormalities are identified. 2. No suspicious calcifications, masses or areas of distortion are seen in the bilateral breasts. RECOMMENDATION: Screening mammogram in one year.(Code:SM-B-01Y) I have discussed the findings and recommendations with the patient. If applicable, a reminder letter will be sent to the patient regarding the next appointment. BI-RADS CATEGORY  1: Negative. Electronically Signed   By: Frederico Hamman M.D.   On: 07/09/2020 15:23   MM DIAG BREAST TOMO BILATERAL  Result Date: 07/09/2020 CLINICAL DATA:  54 year old female presenting for evaluation of 2 palpable lumps in the right breast at 7 o'clock. EXAM: DIGITAL DIAGNOSTIC BILATERAL MAMMOGRAM WITH TOMO AND CAD; ULTRASOUND RIGHT  BREAST LIMITED COMPARISON:  Previous exam(s). ACR Breast Density Category c: The breast tissue is heterogeneously dense, which may obscure small masses. FINDINGS: A BB has been placed at the palpable site of concern on the lower-inner quadrant of the right breast. No suspicious mammographic findings are identified deep to the palpable marker. The patient does have an peripherally calcified catheter fragment from a VP shunt more medial to the marker. No other suspicious calcifications, masses or areas of distortion are seen in the bilateral breasts. Mammographic images were processed with CAD. Ultrasound targeted to the palpable site in the right breast at 4 o'clock, 7 cm from the nipple demonstrates a tubular structure with peripheral calcifications compatible with the patient seen mammographically. No other masses or suspicious areas of shadowing are identified. IMPRESSION: 1. The palpable site in the medial right breast appears to correspond with the patient's VP shunt catheter fragment. No other mammographic or targeted sonographic abnormalities are identified. 2. No suspicious calcifications, masses or areas of distortion are seen in the bilateral breasts. RECOMMENDATION: Screening mammogram in one year.(Code:SM-B-01Y) I have discussed the findings and recommendations with the patient. If applicable, a reminder letter will be sent to the patient regarding the next appointment. BI-RADS CATEGORY  1: Negative. Electronically Signed   By: Frederico Hamman M.D.   On: 07/09/2020 15:23    Assessment & Plan:   24 1 hour daughter she does not talk to [place] Sonda Primes, MD

## 2020-11-11 NOTE — Assessment & Plan Note (Addendum)
RX: Lotrisone cream R>L breast Ketoconazole po

## 2020-11-11 NOTE — Patient Instructions (Signed)
   B-complex with Niacin 100 mg    Lion's mane  

## 2020-11-11 NOTE — Assessment & Plan Note (Signed)
Pain Clinic ref - [facility]

## 2020-11-11 NOTE — Assessment & Plan Note (Signed)
Try Lion's mane 

## 2020-12-03 NOTE — Progress Notes (Deleted)
HPI female never smoker followed for OSA, complicated by history of migraine, hydrocephalus/VP shunt/  and concern for nocturnal seizures, Hx CVA for which she is followed by Neurology, back pain NPSG 03/08/15 at [facility] with full seizure montage- AHI 12.1/ hr, desaturation to 91%, no seizure activity   ------------------------------------------------------------------------------------------------  03/15/17- 55 year old female never smoker followed by Dr. Craige Cotta for OSA, complicated by history of migraine, hydrocephalus/VP shunt and concern for nocturnal seizures, Hx CVA for which she is followed by Neurology, back pain CPAP auto 5-15/[facility] FOLLOWS FOR DME IS [facility]  Down load is attached patient wears cpap every night and with napping  Download 93%/4 hours, AHI 0.7/hour. Uses melatonin for sleep along with her Cymbalta and says this works well. Using CPAP routinely including with naps, taking a nap most afternoons. She is quite satisfied and definitely sleeps better with CPAP. Settled on a nasal mask.  12/07/20- 55 year old female never smoker followed by Dr. Craige Cotta for OSA, complicated by history of migraine, hydrocephalus/VP shunt and concern for nocturnal seizures, Hx CVA for which she is followed by Neurology, back pain CPAP auto 5-15/Advanced Download- Body weight today- Covid vax- Flu vax-    ROS-see HPI   + = pos Constitutional:    weight loss, night sweats, fevers, chills, fatigue, lassitude. HEENT:    headaches, difficulty swallowing, tooth/dental problems, sore throat,       sneezing, itching, ear ache, nasal congestion, post nasal drip, snoring CV:    chest pain, orthopnea, PND, swelling in lower extremities, anasarca,                                                         dizziness, palpitations Resp:   shortness of breath with exertion or at rest.                productive cough,   non-productive cough, coughing up of blood.              change in color of mucus.   wheezing.   Skin:    rash or lesions. GI:  No-   heartburn, indigestion, abdominal pain, nausea, vomiting, diarrhea,                 change in bowel habits, loss of appetite GU: dysuria, change in color of urine, no urgency or frequency.   flank pain. MS:   joint pain, stiffness, decreased range of motion, back pain. Neuro-     nothing unusual Psych:  change in mood or affect.  depression or anxiety.   memory loss.  OBJ- Physical Exam           stable baseline exam General- Alert, Oriented, Affect-appropriate, Distress- none acute Skin- rash-none, lesions- none, excoriation- none Lymphadenopathy- none Head- atraumatic, + R VP shunt            Eyes- Gross vision intact, PERRLA, conjunctivae and secretions clear            Ears- Hearing, canals-normal            Nose- Clear, no-Septal dev, mucus, polyps, erosion, perforation             Throat- Mallampati III-IV , mucosa clear , drainage- none, tonsils- atrophic Neck- flexible , trachea midline, no stridor , thyroid nl, carotid no bruit Chest - symmetrical excursion ,  unlabored           Heart/CV- RRR , no murmur , no gallop  , no rub, nl s1 s2                           - JVD- none , edema- none, stasis changes- none, varices- none           Lung- clear to P&A, wheeze- none, cough- none , dullness-none, rub- none           Chest wall-  Abd-  Br/ Gen/ Rectal- Not done, not indicated Extrem- cyanosis- none, clubbing, none, atrophy- none, strength- nl Neuro- grossly intact to observation

## 2020-12-07 ENCOUNTER — Ambulatory Visit: Payer: Medicare HMO | Admitting: Internal Medicine

## 2020-12-09 DIAGNOSIS — M129 Arthropathy, unspecified: Secondary | ICD-10-CM | POA: Diagnosis not present

## 2020-12-09 DIAGNOSIS — G894 Chronic pain syndrome: Secondary | ICD-10-CM | POA: Diagnosis not present

## 2020-12-09 DIAGNOSIS — M545 Low back pain, unspecified: Secondary | ICD-10-CM | POA: Diagnosis not present

## 2020-12-09 DIAGNOSIS — E559 Vitamin D deficiency, unspecified: Secondary | ICD-10-CM | POA: Diagnosis not present

## 2020-12-09 DIAGNOSIS — Z79899 Other long term (current) drug therapy: Secondary | ICD-10-CM | POA: Diagnosis not present

## 2020-12-17 DIAGNOSIS — G894 Chronic pain syndrome: Secondary | ICD-10-CM | POA: Diagnosis not present

## 2020-12-17 DIAGNOSIS — M545 Low back pain, unspecified: Secondary | ICD-10-CM | POA: Diagnosis not present

## 2020-12-17 DIAGNOSIS — Z79899 Other long term (current) drug therapy: Secondary | ICD-10-CM | POA: Diagnosis not present

## 2020-12-31 DIAGNOSIS — G4733 Obstructive sleep apnea (adult) (pediatric): Secondary | ICD-10-CM | POA: Diagnosis not present

## 2021-01-14 DIAGNOSIS — M545 Low back pain, unspecified: Secondary | ICD-10-CM | POA: Diagnosis not present

## 2021-01-14 DIAGNOSIS — G894 Chronic pain syndrome: Secondary | ICD-10-CM | POA: Diagnosis not present

## 2021-01-14 DIAGNOSIS — Z79899 Other long term (current) drug therapy: Secondary | ICD-10-CM | POA: Diagnosis not present

## 2021-02-01 ENCOUNTER — Ambulatory Visit: Payer: Medicare HMO | Admitting: Internal Medicine

## 2021-02-03 ENCOUNTER — Other Ambulatory Visit: Payer: Self-pay | Admitting: Internal Medicine

## 2021-02-04 DIAGNOSIS — M47812 Spondylosis without myelopathy or radiculopathy, cervical region: Secondary | ICD-10-CM | POA: Diagnosis not present

## 2021-02-04 DIAGNOSIS — M4125 Other idiopathic scoliosis, thoracolumbar region: Secondary | ICD-10-CM | POA: Diagnosis not present

## 2021-02-04 DIAGNOSIS — M5459 Other low back pain: Secondary | ICD-10-CM | POA: Diagnosis not present

## 2021-02-04 DIAGNOSIS — M7918 Myalgia, other site: Secondary | ICD-10-CM | POA: Diagnosis not present

## 2021-02-04 DIAGNOSIS — G8929 Other chronic pain: Secondary | ICD-10-CM | POA: Diagnosis not present

## 2021-02-04 DIAGNOSIS — G894 Chronic pain syndrome: Secondary | ICD-10-CM | POA: Diagnosis not present

## 2021-02-04 DIAGNOSIS — M47816 Spondylosis without myelopathy or radiculopathy, lumbar region: Secondary | ICD-10-CM | POA: Diagnosis not present

## 2021-02-04 DIAGNOSIS — M5136 Other intervertebral disc degeneration, lumbar region: Secondary | ICD-10-CM | POA: Diagnosis not present

## 2021-02-04 DIAGNOSIS — M5030 Other cervical disc degeneration, unspecified cervical region: Secondary | ICD-10-CM | POA: Diagnosis not present

## 2021-02-08 ENCOUNTER — Telehealth: Payer: Self-pay | Admitting: Internal Medicine

## 2021-02-08 NOTE — Telephone Encounter (Signed)
Patient wondering if we have a "healthcare champion packet" with the POA and living wills. Is this something you have?

## 2021-02-11 DIAGNOSIS — M545 Low back pain, unspecified: Secondary | ICD-10-CM | POA: Diagnosis not present

## 2021-02-11 DIAGNOSIS — Z6829 Body mass index (BMI) 29.0-29.9, adult: Secondary | ICD-10-CM | POA: Diagnosis not present

## 2021-02-11 DIAGNOSIS — E663 Overweight: Secondary | ICD-10-CM | POA: Diagnosis not present

## 2021-02-11 DIAGNOSIS — Z79899 Other long term (current) drug therapy: Secondary | ICD-10-CM | POA: Diagnosis not present

## 2021-02-11 DIAGNOSIS — G894 Chronic pain syndrome: Secondary | ICD-10-CM | POA: Diagnosis not present

## 2021-02-28 ENCOUNTER — Other Ambulatory Visit: Payer: Self-pay | Admitting: Internal Medicine

## 2021-03-05 DIAGNOSIS — M419 Scoliosis, unspecified: Secondary | ICD-10-CM | POA: Diagnosis not present

## 2021-03-12 DIAGNOSIS — E669 Obesity, unspecified: Secondary | ICD-10-CM | POA: Diagnosis not present

## 2021-03-12 DIAGNOSIS — Z6831 Body mass index (BMI) 31.0-31.9, adult: Secondary | ICD-10-CM | POA: Diagnosis not present

## 2021-03-12 DIAGNOSIS — G894 Chronic pain syndrome: Secondary | ICD-10-CM | POA: Diagnosis not present

## 2021-03-12 DIAGNOSIS — M545 Low back pain, unspecified: Secondary | ICD-10-CM | POA: Diagnosis not present

## 2021-03-12 DIAGNOSIS — Z79899 Other long term (current) drug therapy: Secondary | ICD-10-CM | POA: Diagnosis not present

## 2021-03-18 ENCOUNTER — Other Ambulatory Visit: Payer: Self-pay | Admitting: Internal Medicine

## 2021-03-19 NOTE — Telephone Encounter (Signed)
Patient is requesting a refill of the following medications: Requested Prescriptions   Pending Prescriptions Disp Refills  . lamoTRIgine (LAMICTAL) 100 MG tablet [Pharmacy Med Name: LAMOTRIGINE 100 MG Tablet] 225 tablet 0    Sig: TAKE 1 TABLET EVERY MORNING, AND 1 AND 1/2 TABLETS AT NIGHT.    Date of patient request: 03/18/21  Last office visit: 11/11/20  Date of last refill: 04/22/20  Last refill amount: 225 tab,1 refill  Follow up time period per chart: 05/12/21

## 2021-04-09 DIAGNOSIS — G4733 Obstructive sleep apnea (adult) (pediatric): Secondary | ICD-10-CM | POA: Diagnosis not present

## 2021-04-13 DIAGNOSIS — E669 Obesity, unspecified: Secondary | ICD-10-CM | POA: Diagnosis not present

## 2021-04-13 DIAGNOSIS — Z79899 Other long term (current) drug therapy: Secondary | ICD-10-CM | POA: Diagnosis not present

## 2021-04-13 DIAGNOSIS — M545 Low back pain, unspecified: Secondary | ICD-10-CM | POA: Diagnosis not present

## 2021-04-13 DIAGNOSIS — G894 Chronic pain syndrome: Secondary | ICD-10-CM | POA: Diagnosis not present

## 2021-04-13 DIAGNOSIS — Z6831 Body mass index (BMI) 31.0-31.9, adult: Secondary | ICD-10-CM | POA: Diagnosis not present

## 2021-04-15 DIAGNOSIS — Z6831 Body mass index (BMI) 31.0-31.9, adult: Secondary | ICD-10-CM | POA: Diagnosis not present

## 2021-04-15 DIAGNOSIS — I10 Essential (primary) hypertension: Secondary | ICD-10-CM | POA: Diagnosis not present

## 2021-04-15 DIAGNOSIS — M47816 Spondylosis without myelopathy or radiculopathy, lumbar region: Secondary | ICD-10-CM | POA: Diagnosis not present

## 2021-04-26 DIAGNOSIS — M47816 Spondylosis without myelopathy or radiculopathy, lumbar region: Secondary | ICD-10-CM | POA: Diagnosis not present

## 2021-05-05 DIAGNOSIS — M47816 Spondylosis without myelopathy or radiculopathy, lumbar region: Secondary | ICD-10-CM | POA: Diagnosis not present

## 2021-05-10 DIAGNOSIS — M545 Low back pain, unspecified: Secondary | ICD-10-CM | POA: Diagnosis not present

## 2021-05-10 DIAGNOSIS — G894 Chronic pain syndrome: Secondary | ICD-10-CM | POA: Diagnosis not present

## 2021-05-10 DIAGNOSIS — Z79899 Other long term (current) drug therapy: Secondary | ICD-10-CM | POA: Diagnosis not present

## 2021-05-10 DIAGNOSIS — Z1211 Encounter for screening for malignant neoplasm of colon: Secondary | ICD-10-CM | POA: Diagnosis not present

## 2021-05-10 DIAGNOSIS — Z6830 Body mass index (BMI) 30.0-30.9, adult: Secondary | ICD-10-CM | POA: Diagnosis not present

## 2021-05-12 ENCOUNTER — Ambulatory Visit: Payer: Medicare HMO | Admitting: Internal Medicine

## 2021-05-24 ENCOUNTER — Other Ambulatory Visit: Payer: Self-pay | Admitting: Internal Medicine

## 2021-05-31 DIAGNOSIS — M47816 Spondylosis without myelopathy or radiculopathy, lumbar region: Secondary | ICD-10-CM | POA: Diagnosis not present

## 2021-06-08 DIAGNOSIS — Z79899 Other long term (current) drug therapy: Secondary | ICD-10-CM | POA: Diagnosis not present

## 2021-06-08 DIAGNOSIS — R030 Elevated blood-pressure reading, without diagnosis of hypertension: Secondary | ICD-10-CM | POA: Diagnosis not present

## 2021-06-08 DIAGNOSIS — E663 Overweight: Secondary | ICD-10-CM | POA: Diagnosis not present

## 2021-06-08 DIAGNOSIS — G894 Chronic pain syndrome: Secondary | ICD-10-CM | POA: Diagnosis not present

## 2021-06-08 DIAGNOSIS — M545 Low back pain, unspecified: Secondary | ICD-10-CM | POA: Diagnosis not present

## 2021-06-10 DIAGNOSIS — Z79899 Other long term (current) drug therapy: Secondary | ICD-10-CM | POA: Diagnosis not present

## 2021-06-14 DIAGNOSIS — M47816 Spondylosis without myelopathy or radiculopathy, lumbar region: Secondary | ICD-10-CM | POA: Diagnosis not present

## 2021-06-16 ENCOUNTER — Other Ambulatory Visit: Payer: Self-pay | Admitting: Internal Medicine

## 2021-07-05 DIAGNOSIS — M47816 Spondylosis without myelopathy or radiculopathy, lumbar region: Secondary | ICD-10-CM | POA: Diagnosis not present

## 2021-07-05 DIAGNOSIS — M7061 Trochanteric bursitis, right hip: Secondary | ICD-10-CM | POA: Diagnosis not present

## 2021-07-07 DIAGNOSIS — G894 Chronic pain syndrome: Secondary | ICD-10-CM | POA: Diagnosis not present

## 2021-07-07 DIAGNOSIS — Z79899 Other long term (current) drug therapy: Secondary | ICD-10-CM | POA: Diagnosis not present

## 2021-07-07 DIAGNOSIS — Z6829 Body mass index (BMI) 29.0-29.9, adult: Secondary | ICD-10-CM | POA: Diagnosis not present

## 2021-07-07 DIAGNOSIS — M545 Low back pain, unspecified: Secondary | ICD-10-CM | POA: Diagnosis not present

## 2021-07-07 DIAGNOSIS — R030 Elevated blood-pressure reading, without diagnosis of hypertension: Secondary | ICD-10-CM | POA: Diagnosis not present

## 2021-07-14 ENCOUNTER — Other Ambulatory Visit: Payer: Self-pay | Admitting: Internal Medicine

## 2021-07-14 DIAGNOSIS — M25562 Pain in left knee: Secondary | ICD-10-CM | POA: Diagnosis not present

## 2021-07-14 DIAGNOSIS — M25551 Pain in right hip: Secondary | ICD-10-CM | POA: Diagnosis not present

## 2021-07-14 DIAGNOSIS — G894 Chronic pain syndrome: Secondary | ICD-10-CM | POA: Diagnosis not present

## 2021-07-14 DIAGNOSIS — M25561 Pain in right knee: Secondary | ICD-10-CM | POA: Diagnosis not present

## 2021-07-14 DIAGNOSIS — M545 Low back pain, unspecified: Secondary | ICD-10-CM | POA: Diagnosis not present

## 2021-07-14 DIAGNOSIS — M25651 Stiffness of right hip, not elsewhere classified: Secondary | ICD-10-CM | POA: Diagnosis not present

## 2021-07-23 DIAGNOSIS — M25561 Pain in right knee: Secondary | ICD-10-CM | POA: Diagnosis not present

## 2021-07-23 DIAGNOSIS — M545 Low back pain, unspecified: Secondary | ICD-10-CM | POA: Diagnosis not present

## 2021-07-23 DIAGNOSIS — M25551 Pain in right hip: Secondary | ICD-10-CM | POA: Diagnosis not present

## 2021-07-23 DIAGNOSIS — M25651 Stiffness of right hip, not elsewhere classified: Secondary | ICD-10-CM | POA: Diagnosis not present

## 2021-07-23 DIAGNOSIS — M25562 Pain in left knee: Secondary | ICD-10-CM | POA: Diagnosis not present

## 2021-07-23 DIAGNOSIS — G894 Chronic pain syndrome: Secondary | ICD-10-CM | POA: Diagnosis not present

## 2021-07-27 DIAGNOSIS — G894 Chronic pain syndrome: Secondary | ICD-10-CM | POA: Diagnosis not present

## 2021-07-27 DIAGNOSIS — M545 Low back pain, unspecified: Secondary | ICD-10-CM | POA: Diagnosis not present

## 2021-07-27 DIAGNOSIS — M25562 Pain in left knee: Secondary | ICD-10-CM | POA: Diagnosis not present

## 2021-07-27 DIAGNOSIS — M25561 Pain in right knee: Secondary | ICD-10-CM | POA: Diagnosis not present

## 2021-07-27 DIAGNOSIS — M25551 Pain in right hip: Secondary | ICD-10-CM | POA: Diagnosis not present

## 2021-07-27 DIAGNOSIS — M25651 Stiffness of right hip, not elsewhere classified: Secondary | ICD-10-CM | POA: Diagnosis not present

## 2021-08-02 ENCOUNTER — Telehealth: Payer: Self-pay | Admitting: *Deleted

## 2021-08-02 MED ORDER — MELOXICAM 15 MG PO TABS: ORAL_TABLET | ORAL | 1 refills | Status: DC

## 2021-08-02 NOTE — Telephone Encounter (Signed)
Refill for meloxicam

## 2021-08-04 DIAGNOSIS — M545 Low back pain, unspecified: Secondary | ICD-10-CM | POA: Diagnosis not present

## 2021-08-04 DIAGNOSIS — Z6829 Body mass index (BMI) 29.0-29.9, adult: Secondary | ICD-10-CM | POA: Diagnosis not present

## 2021-08-04 DIAGNOSIS — G894 Chronic pain syndrome: Secondary | ICD-10-CM | POA: Diagnosis not present

## 2021-08-04 DIAGNOSIS — Z79899 Other long term (current) drug therapy: Secondary | ICD-10-CM | POA: Diagnosis not present

## 2021-08-04 DIAGNOSIS — R030 Elevated blood-pressure reading, without diagnosis of hypertension: Secondary | ICD-10-CM | POA: Diagnosis not present

## 2021-08-06 DIAGNOSIS — G894 Chronic pain syndrome: Secondary | ICD-10-CM | POA: Diagnosis not present

## 2021-08-06 DIAGNOSIS — M25551 Pain in right hip: Secondary | ICD-10-CM | POA: Diagnosis not present

## 2021-08-06 DIAGNOSIS — M25561 Pain in right knee: Secondary | ICD-10-CM | POA: Diagnosis not present

## 2021-08-06 DIAGNOSIS — M545 Low back pain, unspecified: Secondary | ICD-10-CM | POA: Diagnosis not present

## 2021-08-06 DIAGNOSIS — M25651 Stiffness of right hip, not elsewhere classified: Secondary | ICD-10-CM | POA: Diagnosis not present

## 2021-08-06 DIAGNOSIS — M25562 Pain in left knee: Secondary | ICD-10-CM | POA: Diagnosis not present

## 2021-08-11 ENCOUNTER — Other Ambulatory Visit: Payer: Self-pay | Admitting: Internal Medicine

## 2021-08-12 DIAGNOSIS — M25561 Pain in right knee: Secondary | ICD-10-CM | POA: Diagnosis not present

## 2021-08-12 DIAGNOSIS — M47816 Spondylosis without myelopathy or radiculopathy, lumbar region: Secondary | ICD-10-CM | POA: Diagnosis not present

## 2021-08-12 DIAGNOSIS — M545 Low back pain, unspecified: Secondary | ICD-10-CM | POA: Diagnosis not present

## 2021-08-12 DIAGNOSIS — M25562 Pain in left knee: Secondary | ICD-10-CM | POA: Diagnosis not present

## 2021-08-12 DIAGNOSIS — M25551 Pain in right hip: Secondary | ICD-10-CM | POA: Diagnosis not present

## 2021-08-12 DIAGNOSIS — M25651 Stiffness of right hip, not elsewhere classified: Secondary | ICD-10-CM | POA: Diagnosis not present

## 2021-08-12 DIAGNOSIS — G894 Chronic pain syndrome: Secondary | ICD-10-CM | POA: Diagnosis not present

## 2021-08-24 ENCOUNTER — Ambulatory Visit: Payer: Medicare HMO | Admitting: Internal Medicine

## 2021-08-25 DIAGNOSIS — M5116 Intervertebral disc disorders with radiculopathy, lumbar region: Secondary | ICD-10-CM | POA: Diagnosis not present

## 2021-08-26 ENCOUNTER — Ambulatory Visit: Payer: Medicare HMO

## 2021-09-01 DIAGNOSIS — G4733 Obstructive sleep apnea (adult) (pediatric): Secondary | ICD-10-CM | POA: Diagnosis not present

## 2021-09-02 ENCOUNTER — Ambulatory Visit (INDEPENDENT_AMBULATORY_CARE_PROVIDER_SITE_OTHER): Payer: Medicare HMO | Admitting: Internal Medicine

## 2021-09-02 ENCOUNTER — Ambulatory Visit (INDEPENDENT_AMBULATORY_CARE_PROVIDER_SITE_OTHER): Payer: Medicare HMO

## 2021-09-02 ENCOUNTER — Telehealth: Payer: Self-pay | Admitting: Internal Medicine

## 2021-09-02 ENCOUNTER — Encounter: Payer: Self-pay | Admitting: Internal Medicine

## 2021-09-02 ENCOUNTER — Other Ambulatory Visit: Payer: Self-pay

## 2021-09-02 VITALS — BP 162/90 | HR 72 | Temp 98.5°F | Ht <= 58 in | Wt 147.7 lb

## 2021-09-02 DIAGNOSIS — M79605 Pain in left leg: Secondary | ICD-10-CM | POA: Diagnosis not present

## 2021-09-02 DIAGNOSIS — E785 Hyperlipidemia, unspecified: Secondary | ICD-10-CM | POA: Diagnosis not present

## 2021-09-02 DIAGNOSIS — Z0000 Encounter for general adult medical examination without abnormal findings: Secondary | ICD-10-CM

## 2021-09-02 DIAGNOSIS — Z23 Encounter for immunization: Secondary | ICD-10-CM | POA: Diagnosis not present

## 2021-09-02 DIAGNOSIS — M545 Low back pain, unspecified: Secondary | ICD-10-CM

## 2021-09-02 DIAGNOSIS — R413 Other amnesia: Secondary | ICD-10-CM

## 2021-09-02 DIAGNOSIS — R030 Elevated blood-pressure reading, without diagnosis of hypertension: Secondary | ICD-10-CM

## 2021-09-02 LAB — CBC WITH DIFFERENTIAL/PLATELET
Basophils Absolute: 0 10*3/uL (ref 0.0–0.1)
Basophils Relative: 0.7 % (ref 0.0–3.0)
Eosinophils Absolute: 0.4 10*3/uL (ref 0.0–0.7)
Eosinophils Relative: 5.8 % — ABNORMAL HIGH (ref 0.0–5.0)
HCT: 29.4 % — ABNORMAL LOW (ref 36.0–46.0)
Hemoglobin: 9.6 g/dL — ABNORMAL LOW (ref 12.0–15.0)
Lymphocytes Relative: 37.2 % (ref 12.0–46.0)
Lymphs Abs: 2.6 10*3/uL (ref 0.7–4.0)
MCHC: 32.5 g/dL (ref 30.0–36.0)
MCV: 86.1 fl (ref 78.0–100.0)
Monocytes Absolute: 0.6 10*3/uL (ref 0.1–1.0)
Monocytes Relative: 8.8 % (ref 3.0–12.0)
Neutro Abs: 3.3 10*3/uL (ref 1.4–7.7)
Neutrophils Relative %: 47.5 % (ref 43.0–77.0)
Platelets: 432 10*3/uL — ABNORMAL HIGH (ref 150.0–400.0)
RBC: 3.42 Mil/uL — ABNORMAL LOW (ref 3.87–5.11)
RDW: 15.9 % — ABNORMAL HIGH (ref 11.5–15.5)
WBC: 6.9 10*3/uL (ref 4.0–10.5)

## 2021-09-02 LAB — LIPID PANEL
Cholesterol: 217 mg/dL — ABNORMAL HIGH (ref 0–200)
HDL: 52.8 mg/dL (ref >39.00)
NonHDL: 164.19
Total CHOL/HDL Ratio: 4
Triglycerides: 204 mg/dL — ABNORMAL HIGH (ref 0.0–149.0)
VLDL: 40.8 mg/dL — ABNORMAL HIGH (ref 0.0–40.0)

## 2021-09-02 LAB — URINALYSIS
Bilirubin Urine: NEGATIVE
Hgb urine dipstick: NEGATIVE
Ketones, ur: NEGATIVE
Leukocytes,Ua: NEGATIVE
Nitrite: NEGATIVE
Specific Gravity, Urine: 1.025 (ref 1.000–1.030)
Total Protein, Urine: NEGATIVE
Urine Glucose: NEGATIVE
Urobilinogen, UA: 0.2 (ref 0.0–1.0)
pH: 6 (ref 5.0–8.0)

## 2021-09-02 LAB — COMPREHENSIVE METABOLIC PANEL
ALT: 31 U/L (ref 0–35)
AST: 35 U/L (ref 0–37)
Albumin: 4 g/dL (ref 3.5–5.2)
Alkaline Phosphatase: 67 U/L (ref 39–117)
BUN: 12 mg/dL (ref 6–23)
CO2: 34 mEq/L — ABNORMAL HIGH (ref 19–32)
Calcium: 9.7 mg/dL (ref 8.4–10.5)
Chloride: 100 mEq/L (ref 96–112)
Creatinine, Ser: 0.98 mg/dL (ref 0.40–1.20)
GFR: 64.12 mL/min (ref >60.00)
Glucose, Bld: 92 mg/dL (ref 70–99)
Potassium: 3.8 mEq/L (ref 3.5–5.1)
Sodium: 139 mEq/L (ref 135–145)
Total Bilirubin: 0.2 mg/dL (ref 0.2–1.2)
Total Protein: 6.7 g/dL (ref 6.0–8.3)

## 2021-09-02 LAB — TSH: TSH: 3.09 u[IU]/mL (ref 0.35–5.50)

## 2021-09-02 LAB — LDL CHOLESTEROL, DIRECT: Direct LDL: 120 mg/dL

## 2021-09-02 MED ORDER — MEMANTINE HCL 5 MG PO TABS: 5.0000 mg | ORAL_TABLET | Freq: Two times a day (BID) | ORAL | 3 refills | Status: DC

## 2021-09-02 MED ORDER — LUBIPROSTONE 24 MCG PO CAPS: 24.0000 ug | ORAL_CAPSULE | Freq: Two times a day (BID) | ORAL | 11 refills | Status: AC

## 2021-09-02 NOTE — Progress Notes (Signed)
Subjective:  Patient ID: Samantha [name], female    DOB: 1966-07-04  Age: 55 y.o. MRN: [id number]  CC: Follow-up   HPI Samantha [name] presents for HTN, dyslipidemia, chronic pain, depression She is here w/Donna her friend  Outpatient Medications Prior to Visit  Medication Sig Dispense Refill   aspirin 81 MG tablet Take 81 mg by mouth daily.     atorvastatin (LIPITOR) 20 MG tablet TAKE 1 TABLET EVERY DAY AT 6 PM 90 tablet 3   busPIRone (BUSPAR) 10 MG tablet TAKE 1 TABLET THREE TIMES DAILY 270 tablet 1   Calcium Carbonate-Vitamin D (CALCIUM-VITAMIN D) 500-200 MG-UNIT per tablet Take 1 tablet by mouth 2 (two) times daily with a meal.     Docusate Calcium (STOOL SOFTENER PO) Take by mouth 2 (two) times daily.     donepezil (ARICEPT) 10 MG tablet TAKE 1 TABLET (10 MG TOTAL) BY MOUTH AT BEDTIME. 90 tablet 0   DULoxetine (CYMBALTA) 60 MG capsule TAKE 1 CAPSULE EVERY DAY 90 capsule 3   famotidine (PEPCID) 40 MG tablet TAKE 1 TABLET EVERY DAY 90 tablet 3   gabapentin (NEURONTIN) 300 MG capsule Take 300 mg by mouth 3 (three) times daily.     lactulose (CHRONULAC) 10 GM/15ML solution Take 30-45 mLs (20-30 g total) by mouth 2 (two) times daily as needed for moderate constipation or severe constipation. 946 mL 5   lamoTRIgine (LAMICTAL) 100 MG tablet TAKE 1 TABLET EVERY MORNING, AND 1 AND 1/2 TABLETS AT NIGHT. 225 tablet 0   lidocaine (LIDODERM) 5 % Place 1 patch onto the skin daily. Remove & Discard patch within 12 hours or as directed by MD 180 patch 1   LINZESS 290 MCG CAPS capsule TAKE 1 CAPSULE DAILY BEFORE BREAKFAST 30 capsule 5   meloxicam (MOBIC) 15 MG tablet Take 1/2 to 1 tablet by mouth daily as needed for pain 90 tablet 1   morphine (MS CONTIN) 30 MG 12 hr tablet Take 30 mg by mouth every 12 (twelve) hours.     naloxone (NARCAN) nasal spray 4 mg/0.1 mL Place into the nose.     Oxycodone HCl 20 MG TABS Take 1 tablet by mouth every 6 (six) hours.     Probiotic Product (ALIGN) 4 MG CAPS  Take 1 capsule (4 mg total) by mouth daily. 90 capsule 1   clotrimazole-betamethasone (LOTRISONE) cream Apply 1 application topically 2 (two) times daily. (Patient not taking: Reported on 09/02/2021) 45 g 1   ketoconazole (NIZORAL) 200 MG tablet Take 1 tablet (200 mg total) by mouth daily. (Patient not taking: Reported on 09/02/2021) 10 tablet 0   No facility-administered medications prior to visit.    ROS: Review of Systems  Constitutional:  Negative for activity change, appetite change, chills, fatigue and unexpected weight change.  HENT:  Negative for congestion, mouth sores and sinus pressure.   Eyes:  Negative for visual disturbance.  Respiratory:  Negative for cough and chest tightness.   Gastrointestinal:  Negative for abdominal pain and nausea.  Genitourinary:  Negative for difficulty urinating, frequency and vaginal pain.  Musculoskeletal:  Positive for arthralgias and back pain. Negative for gait problem.  Skin:  Negative for pallor and rash.  Neurological:  Negative for dizziness, tremors, weakness, numbness and headaches.  Psychiatric/Behavioral:  Positive for decreased concentration. Negative for confusion and sleep disturbance.    Objective:  BP (!) 162/90 (BP Location: Left Arm)   Pulse 72   Temp 98.5 F (36.9 C) (Oral)  Ht 4\' 10"  (1.473 m)   Wt 147 lb 9.6 oz (67 kg)   SpO2 96%   BMI 30.85 kg/m   BP Readings from Last 3 Encounters:  09/02/21 (!) 162/90  11/11/20 (!) 182/102  09/11/20 (!) 148/87    Wt Readings from Last 3 Encounters:  09/02/21 147 lb 9.6 oz (67 kg)  11/11/20 149 lb (67.6 kg)  09/11/20 148 lb (67.1 kg)    Physical Exam Constitutional:      General: She is not in acute distress.    Appearance: She is well-developed. She is obese.  HENT:     Head: Normocephalic.     Right Ear: External ear normal.     Left Ear: External ear normal.     Nose: Nose normal.  Eyes:     General:        Right eye: No discharge.        Left eye: No  discharge.     Conjunctiva/sclera: Conjunctivae normal.     Pupils: Pupils are equal, round, and reactive to light.  Neck:     Thyroid: No thyromegaly.     Vascular: No JVD.     Trachea: No tracheal deviation.  Cardiovascular:     Rate and Rhythm: Normal rate and regular rhythm.     Heart sounds: Normal heart sounds.  Pulmonary:     Effort: No respiratory distress.     Breath sounds: No stridor. No wheezing.  Abdominal:     General: Bowel sounds are normal. There is no distension.     Palpations: Abdomen is soft. There is no mass.     Tenderness: There is no abdominal tenderness. There is no guarding or rebound.  Musculoskeletal:        General: Tenderness present.     Cervical back: Normal range of motion and neck supple. No rigidity.  Lymphadenopathy:     Cervical: No cervical adenopathy.  Skin:    Findings: No erythema or rash.  Neurological:     Mental Status: She is oriented to person, place, and time.     Cranial Nerves: No cranial nerve deficit.     Motor: No abnormal muscle tone.     Coordination: Coordination normal.     Gait: Gait abnormal.     Deep Tendon Reflexes: Reflexes normal.  Psychiatric:        Behavior: Behavior normal.        Thought Content: Thought content normal.        Judgment: Judgment normal.    Lab Results  Component Value Date   WBC 6.3 07/16/2020   HGB 13.1 07/16/2020   HCT 38.5 07/16/2020   PLT 421.0 (H) 07/16/2020   GLUCOSE 97 07/16/2020   CHOL 171 07/16/2020   TRIG 168.0 (H) 07/16/2020   HDL 52.50 07/16/2020   LDLDIRECT 207.7 05/02/2011   LDLCALC 85 07/16/2020   ALT 27 07/16/2020   AST 31 07/16/2020   NA 138 07/16/2020   K 4.4 07/16/2020   CL 100 07/16/2020   CREATININE 0.96 07/16/2020   BUN 18 07/16/2020   CO2 31 07/16/2020   TSH 2.72 07/16/2020   HGBA1C 5.3 07/16/2020    09/15/2020 BREAST LTD UNI RIGHT INC AXILLA  Result Date: 07/09/2020 CLINICAL DATA:  55 year old female presenting for evaluation of 2 palpable lumps in the  right breast at 7 o'clock. EXAM: DIGITAL DIAGNOSTIC BILATERAL MAMMOGRAM WITH TOMO AND CAD; ULTRASOUND RIGHT BREAST LIMITED COMPARISON:  Previous exam(s). ACR Breast Density Category c: The  breast tissue is heterogeneously dense, which may obscure small masses. FINDINGS: A BB has been placed at the palpable site of concern on the lower-inner quadrant of the right breast. No suspicious mammographic findings are identified deep to the palpable marker. The patient does have an peripherally calcified catheter fragment from a VP shunt more medial to the marker. No other suspicious calcifications, masses or areas of distortion are seen in the bilateral breasts. Mammographic images were processed with CAD. Ultrasound targeted to the palpable site in the right breast at 4 o'clock, 7 cm from the nipple demonstrates a tubular structure with peripheral calcifications compatible with the patient seen mammographically. No other masses or suspicious areas of shadowing are identified. IMPRESSION: 1. The palpable site in the medial right breast appears to correspond with the patient's VP shunt catheter fragment. No other mammographic or targeted sonographic abnormalities are identified. 2. No suspicious calcifications, masses or areas of distortion are seen in the bilateral breasts. RECOMMENDATION: Screening mammogram in one year.(Code:SM-B-01Y) I have discussed the findings and recommendations with the patient. If applicable, a reminder letter will be sent to the patient regarding the next appointment. BI-RADS CATEGORY  1: Negative. Electronically Signed   By: Frederico Hamman M.D.   On: 07/09/2020 15:23   MM DIAG BREAST TOMO BILATERAL  Result Date: 07/09/2020 CLINICAL DATA:  55 year old female presenting for evaluation of 2 palpable lumps in the right breast at 7 o'clock. EXAM: DIGITAL DIAGNOSTIC BILATERAL MAMMOGRAM WITH TOMO AND CAD; ULTRASOUND RIGHT BREAST LIMITED COMPARISON:  Previous exam(s). ACR Breast Density Category  c: The breast tissue is heterogeneously dense, which may obscure small masses. FINDINGS: A BB has been placed at the palpable site of concern on the lower-inner quadrant of the right breast. No suspicious mammographic findings are identified deep to the palpable marker. The patient does have an peripherally calcified catheter fragment from a VP shunt more medial to the marker. No other suspicious calcifications, masses or areas of distortion are seen in the bilateral breasts. Mammographic images were processed with CAD. Ultrasound targeted to the palpable site in the right breast at 4 o'clock, 7 cm from the nipple demonstrates a tubular structure with peripheral calcifications compatible with the patient seen mammographically. No other masses or suspicious areas of shadowing are identified. IMPRESSION: 1. The palpable site in the medial right breast appears to correspond with the patient's VP shunt catheter fragment. No other mammographic or targeted sonographic abnormalities are identified. 2. No suspicious calcifications, masses or areas of distortion are seen in the bilateral breasts. RECOMMENDATION: Screening mammogram in one year.(Code:SM-B-01Y) I have discussed the findings and recommendations with the patient. If applicable, a reminder letter will be sent to the patient regarding the next appointment. BI-RADS CATEGORY  1: Negative. Electronically Signed   By: Frederico Hamman M.D.   On: 07/09/2020 15:23    Assessment & Plan:   Problem List Items Addressed This Visit     Dyslipidemia    On Lipitor      Elevated BP without diagnosis of hypertension    States it is a "white coat HTN". Check BP at home       Low back pain radiating to left lower extremity    On Oxycodone and MS Contin per Pain [facility] Try water aerobic, chair yoga      Memory loss    On Aricept 10 mg/d Worse. Will add Namenda         Meds ordered this encounter  Medications   lubiprostone (AMITIZA) 24  MCG capsule     Sig: Take 1 capsule (24 mcg total) by mouth 2 (two) times daily with a meal.    Dispense:  60 capsule    Refill:  11       Follow-up: Return in about 6 months (around 03/03/2022) for a follow-up visit.  Sonda Primes, MD

## 2021-09-02 NOTE — Assessment & Plan Note (Signed)
States it is a "white coat HTN". Check BP at home

## 2021-09-02 NOTE — Progress Notes (Addendum)
Subjective:   Samantha [name] is a 55 y.o. female who presents for Medicare Annual (Subsequent) preventive examination.  Review of Systems     Cardiac Risk Factors include: advanced age (>80men, >42 women);dyslipidemia;family history of premature cardiovascular disease;hypertension;obesity (BMI >30kg/m2);sedentary lifestyle     Objective:    Today's Vitals   09/02/21 1139  BP: (!) 162/90  Pulse: 72  Temp: 98.5 F (36.9 C)  SpO2: 96%  Weight: 147 lb 11.3 oz (67 kg)  Height: 4\' 10"  (1.473 m)   Body mass index is 30.87 kg/m.  Advanced Directives 09/02/2021 08/29/2018 10/27/2016 04/28/2016 07/12/2015 10/29/2014 09/30/2014  Does Patient Have a Medical Advance Directive? No No No No No No No  Would patient like information on creating a medical advance directive? No - Patient declined - No - Patient declined - - - -    Current Medications (verified) Outpatient Encounter Medications as of 09/02/2021  Medication Sig   aspirin 81 MG tablet Take 81 mg by mouth daily.   atorvastatin (LIPITOR) 20 MG tablet TAKE 1 TABLET EVERY DAY AT 6 PM   busPIRone (BUSPAR) 10 MG tablet TAKE 1 TABLET THREE TIMES DAILY   Calcium Carbonate-Vitamin D (CALCIUM-VITAMIN D) 500-200 MG-UNIT per tablet Take 1 tablet by mouth 2 (two) times daily with a meal.   Docusate Calcium (STOOL SOFTENER PO) Take by mouth 2 (two) times daily.   donepezil (ARICEPT) 10 MG tablet TAKE 1 TABLET (10 MG TOTAL) BY MOUTH AT BEDTIME.   DULoxetine (CYMBALTA) 60 MG capsule TAKE 1 CAPSULE EVERY DAY   famotidine (PEPCID) 40 MG tablet TAKE 1 TABLET EVERY DAY   gabapentin (NEURONTIN) 300 MG capsule Take 300 mg by mouth 3 (three) times daily.   lactulose (CHRONULAC) 10 GM/15ML solution Take 30-45 mLs (20-30 g total) by mouth 2 (two) times daily as needed for moderate constipation or severe constipation.   lamoTRIgine (LAMICTAL) 100 MG tablet TAKE 1 TABLET EVERY MORNING, AND 1 AND 1/2 TABLETS AT NIGHT.   lidocaine (LIDODERM) 5 % Place 1  patch onto the skin daily. Remove & Discard patch within 12 hours or as directed by MD   LINZESS 290 MCG CAPS capsule TAKE 1 CAPSULE DAILY BEFORE BREAKFAST   lubiprostone (AMITIZA) 24 MCG capsule Take 1 capsule (24 mcg total) by mouth 2 (two) times daily with a meal.   meloxicam (MOBIC) 15 MG tablet Take 1/2 to 1 tablet by mouth daily as needed for pain   memantine (NAMENDA) 5 MG tablet Take 1 tablet (5 mg total) by mouth 2 (two) times daily.   morphine (MS CONTIN) 30 MG 12 hr tablet Take 30 mg by mouth every 12 (twelve) hours.   naloxone (NARCAN) nasal spray 4 mg/0.1 mL Place into the nose.   Oxycodone HCl 20 MG TABS Take 1 tablet by mouth every 6 (six) hours.   Probiotic Product (ALIGN) 4 MG CAPS Take 1 capsule (4 mg total) by mouth daily.   No facility-administered encounter medications on file as of 09/02/2021.    Allergies (verified) Alprazolam and Citalopram hydrobromide   History: Past Medical History:  Diagnosis Date   Anemia    Anxiety    Cerebral ventricular shunt fitting or adjustment    COMMON MIGRAINE 10/01/2007   Chronic     Depression    GERD (gastroesophageal reflux disease)    GLUCOSE INTOLERANCE 10/01/2007   Qualifier: Diagnosis of  By: 10/03/2007 MD, Jonny Ruiz    Hiatal hernia    Hydrocephalus (HCC)  Hyperlipidemia    HYPERLIPIDEMIA 10/01/2007   Chronic. Lovaza is too $$$ Declined statins due to worries    IDA (iron deficiency anemia)    Memory loss    Migraine    OBSTRUCTIVE SLEEP APNEA 10/01/2007   NPSG 2006:  AHI 9/hr Started cpap 2009 successfully.      Paresthesia    Schatzki's ring    Sleep apnea    Stroke [facility]) 03/2013   Past Surgical History:  Procedure Laterality Date   Ames Shunt  1976,   cerebral vascular shunt/ with a revision 1981   CHOLECYSTECTOMY  1981   CYST REMOVAL NECK     Pituitary cyst w/subsequent shunt     Family History  Problem Relation Age of Onset   Dementia Mother    Kidney cancer Mother    Alzheimer's disease Mother     Migraines Mother    Colon polyps Mother    Heart disease Father    Diabetes Father    Melanoma Other    Lung cancer Other    Lung cancer Other    Brain cancer Maternal Aunt    Lung cancer Maternal Uncle    Esophageal cancer Maternal Grandmother    Alzheimer's disease Paternal Grandmother    Colon cancer Neg Hx    Social History   Socioeconomic History   Marital status: Married    Spouse name: [name]   Number of children: 0   Years of education: 12   Highest education level: Not on file  Occupational History   Occupation: [employer]    Comment: Clinical biochemist Rep   Occupation: disabled  Tobacco Use   Smoking status: Never   Smokeless tobacco: Never  Substance and Sexual Activity   Alcohol use: Yes    Alcohol/week: 1.0 standard drink    Types: 1 Glasses of wine per week    Comment: Social   Drug use: No   Sexual activity: Yes  Other Topics Concern   Not on file  Social History Narrative   Mother is in a NH.    Patient lives at home with her husband [name]). 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    High school education.   Right handed.   Caffeine 1 cup daily.   She has no children   Social Determinants of Corporate investment banker Strain: Low Risk    Difficulty of Paying Living Expenses: Not hard at all  Food Insecurity: No Food Insecurity   Worried About Programme researcher, broadcasting/film/video in the Last Year: Never true   Barista in the Last Year: Never true  Transportation Needs: No Transportation Needs   Lack of Transportation (Medical): No   Lack of Transportation (Non-Medical): No  Physical Activity: Inactive   Days of Exercise per Week: 0 days   Minutes of Exercise per Session: 0 min  Stress: No Stress Concern Present   Feeling of Stress : Not at all  Social Connections: Socially Integrated   Frequency of Communication with Friends and Family: More than three times a week   Frequency of Social Gatherings with Friends and Family: More than three times a  week   Attends Religious Services: 1 to 4 times per year   Active Member of [employer] or Organizations: Yes   Attends Banker Meetings: 1 to 4 times per year   Marital Status: Married    Tobacco Counseling Counseling given: Not Answered   Clinical Intake:  Pre-visit preparation completed: Yes  Pain : No/denies pain     BMI - recorded: 30.85 Nutritional Status: BMI > 30  Obese Nutritional Risks: None Diabetes: No  How often do you need to have someone help you when you read instructions, pamphlets, or other written materials from your doctor or pharmacy?: 1 - Never What is the last grade level you completed in school?: HSG  Diabetic? no  Interpreter Needed?: No  Information entered by :: Susie Cassette, LPN   Activities of Daily Living In your present state of health, do you have any difficulty performing the following activities: 09/02/2021  Hearing? N  Vision? N  Difficulty concentrating or making decisions? Y  Walking or climbing stairs? N  Dressing or bathing? N  Doing errands, shopping? Y  Preparing Food and eating ? N  Using the Toilet? N  In the past six months, have you accidently leaked urine? N  Do you have problems with loss of bowel control? N  Managing your Medications? N  Managing your Finances? N  Housekeeping or managing your Housekeeping? N  Some recent data might be hidden    Patient Care Team: Plotnikov, Georgina Quint, MD as PCP - General (Internal Medicine) Plotnikov, Georgina Quint, MD as Referring Physician (Internal Medicine) Mezer, Dimas Aguas, MD (Obstetrics and Gynecology) Levert Feinstein, MD as Consulting Physician (Neurology) Coralyn Helling, MD as Consulting Physician (Pulmonary Disease) Shellia Carwin., MD as Referring Physician (Neurosurgery) Ellwood Sayers, MD as Referring Physician (Physical Medicine and Rehabilitation) Blima Ledger, OD as Consulting Physician (Optometry)  Indicate any recent Medical Services you may have  received from other than [facility] providers in the past year (date may be approximate).     Assessment:   This is a routine wellness examination for Reginia.  Hearing/Vision screen Hearing Screening - Comments:: Patient denied any hearing difficulty. No hearing aids needed. Vision Screening - Comments:: Patient wears corrective glasses/contacts.  Eye exam done annually by: [facility]   Dietary issues and exercise activities discussed: Current Exercise Habits: The patient does not participate in regular exercise at present, Exercise limited by: orthopedic condition(s)   Goals Addressed   None   Depression Screen PHQ 2/9 Scores 09/02/2021 09/11/2020 04/28/2016  PHQ - 2 Score 0 2 1  PHQ- 9 Score - 9 11    Fall Risk Fall Risk  09/02/2021 09/11/2020 04/28/2016  Falls in the past year? 0 0 No  Number falls in past yr: 0 - -  Injury with Fall? 0 - -  Risk for fall due to : No Fall Risks - -  Follow up Falls evaluation completed - -    FALL RISK PREVENTION PERTAINING TO THE HOME:  Any stairs in or around the home? No  If so, are there any without handrails? No  Home free of loose throw rugs in walkways, pet beds, electrical cords, etc? Yes  Adequate lighting in your home to reduce risk of falls? Yes   ASSISTIVE DEVICES UTILIZED TO PREVENT FALLS:  Life alert? No  Use of a cane, walker or w/c? No  Grab bars in the bathroom? Yes  Shower chair or bench in shower? Yes  Elevated toilet seat or a handicapped toilet? No   TIMED UP AND GO:  Was the test performed? Yes .  Length of time to ambulate 10 feet: 8 sec.   Gait steady and fast without use of assistive device  Cognitive Function: MMSE - Mini Mental State Exam 03/18/2015 12/18/2014  Orientation to time 5 5  Orientation to Place  5 5  Registration 3 3  Attention/ Calculation 5 5  Recall 2 2  Language- name 2 objects 2 2  Language- repeat 1 1  Language- follow 3 step command 3 3  Language- read & follow direction 1 1   Write a sentence 1 1  Copy design 1 1  Total score 29 29        Immunizations Immunization History  Administered Date(s) Administered   Influenza Whole 09/06/2012   Influenza,inj,Quad PF,6+ Mos 08/08/2013, 07/07/2014, 08/27/2015, 08/22/2016, 09/02/2021   PFIZER(Purple Top)SARS-COV-2 Vaccination 02/07/2020, 03/02/2020   Pneumococcal Conjugate-13 12/26/2016   Tdap 01/23/2015    TDAP status: Up to date  Flu Vaccine status: Up to date  Pneumococcal vaccine status: Up to date  Covid-19 vaccine status: Completed vaccines  Qualifies for Shingles Vaccine? Yes   Zostavax completed No   Shingrix Completed?: No.    Education has been provided regarding the importance of this vaccine. Patient has been advised to call insurance company to determine out of pocket expense if they have not yet received this vaccine. Advised may also receive vaccine at local pharmacy or Health Dept. Verbalized acceptance and understanding.  Screening Tests Health Maintenance  Topic Date Due   HIV Screening  Never done   Hepatitis C Screening  Never done   Zoster Vaccines- Shingrix (1 of 2) Never done   Pneumococcal Vaccine 22-80 Years old (2 - PPSV23 if available, else PCV20) 12/26/2017   PAP SMEAR-Modifier  09/06/2019   COVID-19 Vaccine (3 - Pfizer risk series) 03/30/2020   MAMMOGRAM  07/09/2022   COLONOSCOPY (Pts 45-72yrs Insurance coverage will need to be confirmed)  10/29/2024   TETANUS/TDAP  01/22/2025   INFLUENZA VACCINE  Completed   HPV VACCINES  Aged Out    Health Maintenance  Health Maintenance Due  Topic Date Due   HIV Screening  Never done   Hepatitis C Screening  Never done   Zoster Vaccines- Shingrix (1 of 2) Never done   Pneumococcal Vaccine 77-37 Years old (2 - PPSV23 if available, else PCV20) 12/26/2017   PAP SMEAR-Modifier  09/06/2019   COVID-19 Vaccine (3 - Pfizer risk series) 03/30/2020    Colorectal cancer screening: Type of screening: Colonoscopy. Completed 10/29/2014.  Repeat every 10 years  Mammogram status: Completed 07/09/2020. Repeat every year  Bone density status: never done  Lung Cancer Screening: (Low Dose CT Chest recommended if Age 2-80 years, 30 pack-year currently smoking OR have quit w/in 15years.) does not qualify.   Lung Cancer Screening Referral: no  Additional Screening:  Hepatitis C Screening: does not qualify; Completed no  Vision Screening: Recommended annual ophthalmology exams for early detection of glaucoma and other disorders of the eye. Is the patient up to date with their annual eye exam?  Yes  Who is the provider or what is the name of the office in which the patient attends annual eye exams? Reggie Pile, OD If pt is not established with a provider, would they like to be referred to a provider to establish care? No .   Dental Screening: Recommended annual dental exams for proper oral hygiene  Community Resource Referral / Chronic Care Management: CRR required this visit?  No   CCM required this visit?  No      Plan:     I have personally reviewed and noted the following in the patient's chart:   Medical and social history Use of alcohol, tobacco or illicit drugs  Current medications and supplements including opioid prescriptions.  Functional ability and status Nutritional status Physical activity Advanced directives List of other physicians Hospitalizations, surgeries, and ER visits in previous 12 months Vitals Screenings to include cognitive, depression, and falls Referrals and appointments  In addition, I have reviewed and discussed with patient certain preventive protocols, quality metrics, and best practice recommendations. A written personalized care plan for preventive services as well as general preventive health recommendations were provided to patient.     Mickeal Needy, LPN   [id number]   Nurse Notes:  Hearing Screening - Comments:: Patient denied any hearing difficulty. No hearing  aids needed. Vision Screening - Comments:: Patient wears corrective glasses/contacts.  Eye exam done annually by: [facility]    Medical screening examination/treatment/procedure(s) were performed by non-physician practitioner and as supervising physician I was immediately available for consultation/collaboration.  I agree with above. Jacinta Shoe, MD

## 2021-09-02 NOTE — Assessment & Plan Note (Signed)
On Aricept 10 mg/d Worse. Will add Namenda

## 2021-09-02 NOTE — Assessment & Plan Note (Addendum)
On Oxycodone and MS Contin per Pain Clinic Try water aerobic, chair yoga Try TENS unit

## 2021-09-02 NOTE — Telephone Encounter (Signed)
Patient had visit w/ provider today & was prescribed lubiprostone (AMITIZA) 24 MCG capsule  Patient says med is not on her ins formulary & the OOP cost is too expensive for her  Would like to know if provider can send in an alternative that is cheaper

## 2021-09-02 NOTE — Assessment & Plan Note (Signed)
On Lipitor 

## 2021-09-02 NOTE — Addendum Note (Signed)
Addended by: Madelon Lips on: 09/02/2021 11:31 AM   Modules accepted: Orders

## 2021-09-03 NOTE — Telephone Encounter (Signed)
There is nothing cheaper in this family of drugs.  She can try MiraLAX 1 scoop 2-3 times a day as needed.  Thanks

## 2021-09-03 NOTE — Telephone Encounter (Signed)
Called pt there was no answer LMOM w/MD response../lmb 

## 2021-09-03 NOTE — Telephone Encounter (Signed)
Patient would like to have the alternative called into this pharmacy instead...  [id number] [place], [place] - [address] Phone:  [phone]  Fax:  [phone]

## 2021-09-04 ENCOUNTER — Other Ambulatory Visit: Payer: Self-pay | Admitting: Internal Medicine

## 2021-09-04 DIAGNOSIS — D509 Iron deficiency anemia, unspecified: Secondary | ICD-10-CM

## 2021-09-07 DIAGNOSIS — M545 Low back pain, unspecified: Secondary | ICD-10-CM | POA: Diagnosis not present

## 2021-09-07 DIAGNOSIS — Z6830 Body mass index (BMI) 30.0-30.9, adult: Secondary | ICD-10-CM | POA: Diagnosis not present

## 2021-09-07 DIAGNOSIS — R030 Elevated blood-pressure reading, without diagnosis of hypertension: Secondary | ICD-10-CM | POA: Diagnosis not present

## 2021-09-07 DIAGNOSIS — G894 Chronic pain syndrome: Secondary | ICD-10-CM | POA: Diagnosis not present

## 2021-09-07 DIAGNOSIS — Z79899 Other long term (current) drug therapy: Secondary | ICD-10-CM | POA: Diagnosis not present

## 2021-09-13 DIAGNOSIS — Z79899 Other long term (current) drug therapy: Secondary | ICD-10-CM | POA: Diagnosis not present

## 2021-10-05 DIAGNOSIS — M545 Low back pain, unspecified: Secondary | ICD-10-CM | POA: Diagnosis not present

## 2021-10-05 DIAGNOSIS — Z79899 Other long term (current) drug therapy: Secondary | ICD-10-CM | POA: Diagnosis not present

## 2021-10-05 DIAGNOSIS — Z9189 Other specified personal risk factors, not elsewhere classified: Secondary | ICD-10-CM | POA: Diagnosis not present

## 2021-10-05 DIAGNOSIS — G894 Chronic pain syndrome: Secondary | ICD-10-CM | POA: Diagnosis not present

## 2021-10-12 ENCOUNTER — Other Ambulatory Visit: Payer: Self-pay | Admitting: Internal Medicine

## 2021-11-04 DIAGNOSIS — Z79899 Other long term (current) drug therapy: Secondary | ICD-10-CM | POA: Diagnosis not present

## 2021-11-04 DIAGNOSIS — M545 Low back pain, unspecified: Secondary | ICD-10-CM | POA: Diagnosis not present

## 2021-11-04 DIAGNOSIS — Z6830 Body mass index (BMI) 30.0-30.9, adult: Secondary | ICD-10-CM | POA: Diagnosis not present

## 2021-11-04 DIAGNOSIS — R030 Elevated blood-pressure reading, without diagnosis of hypertension: Secondary | ICD-10-CM | POA: Diagnosis not present

## 2021-11-04 DIAGNOSIS — G894 Chronic pain syndrome: Secondary | ICD-10-CM | POA: Diagnosis not present

## 2021-12-02 DIAGNOSIS — Z6830 Body mass index (BMI) 30.0-30.9, adult: Secondary | ICD-10-CM | POA: Diagnosis not present

## 2021-12-02 DIAGNOSIS — M5116 Intervertebral disc disorders with radiculopathy, lumbar region: Secondary | ICD-10-CM | POA: Diagnosis not present

## 2021-12-02 DIAGNOSIS — G894 Chronic pain syndrome: Secondary | ICD-10-CM | POA: Diagnosis not present

## 2021-12-02 DIAGNOSIS — Z79899 Other long term (current) drug therapy: Secondary | ICD-10-CM | POA: Diagnosis not present

## 2021-12-02 DIAGNOSIS — M47816 Spondylosis without myelopathy or radiculopathy, lumbar region: Secondary | ICD-10-CM | POA: Diagnosis not present

## 2021-12-02 DIAGNOSIS — M545 Low back pain, unspecified: Secondary | ICD-10-CM | POA: Diagnosis not present

## 2021-12-02 DIAGNOSIS — M7061 Trochanteric bursitis, right hip: Secondary | ICD-10-CM | POA: Diagnosis not present

## 2021-12-02 DIAGNOSIS — R030 Elevated blood-pressure reading, without diagnosis of hypertension: Secondary | ICD-10-CM | POA: Diagnosis not present

## 2021-12-09 ENCOUNTER — Ambulatory Visit: Payer: Medicare HMO | Admitting: Internal Medicine

## 2021-12-20 ENCOUNTER — Other Ambulatory Visit: Payer: Self-pay | Admitting: Internal Medicine

## 2021-12-25 ENCOUNTER — Other Ambulatory Visit: Payer: Self-pay | Admitting: Internal Medicine

## 2021-12-31 DIAGNOSIS — G894 Chronic pain syndrome: Secondary | ICD-10-CM | POA: Diagnosis not present

## 2021-12-31 DIAGNOSIS — Z79899 Other long term (current) drug therapy: Secondary | ICD-10-CM | POA: Diagnosis not present

## 2021-12-31 DIAGNOSIS — R030 Elevated blood-pressure reading, without diagnosis of hypertension: Secondary | ICD-10-CM | POA: Diagnosis not present

## 2021-12-31 DIAGNOSIS — M545 Low back pain, unspecified: Secondary | ICD-10-CM | POA: Diagnosis not present

## 2021-12-31 DIAGNOSIS — Z6830 Body mass index (BMI) 30.0-30.9, adult: Secondary | ICD-10-CM | POA: Diagnosis not present

## 2022-01-12 ENCOUNTER — Other Ambulatory Visit: Payer: Self-pay

## 2022-01-12 ENCOUNTER — Encounter: Payer: Self-pay | Admitting: Internal Medicine

## 2022-01-12 ENCOUNTER — Ambulatory Visit (INDEPENDENT_AMBULATORY_CARE_PROVIDER_SITE_OTHER): Payer: Medicare HMO | Admitting: Internal Medicine

## 2022-01-12 VITALS — BP 162/94 | HR 79 | Temp 98.9°F | Ht <= 58 in | Wt 146.0 lb

## 2022-01-12 DIAGNOSIS — F419 Anxiety disorder, unspecified: Secondary | ICD-10-CM

## 2022-01-12 DIAGNOSIS — R0989 Other specified symptoms and signs involving the circulatory and respiratory systems: Secondary | ICD-10-CM | POA: Diagnosis not present

## 2022-01-12 DIAGNOSIS — G4733 Obstructive sleep apnea (adult) (pediatric): Secondary | ICD-10-CM

## 2022-01-12 DIAGNOSIS — R0981 Nasal congestion: Secondary | ICD-10-CM | POA: Diagnosis not present

## 2022-01-12 DIAGNOSIS — G4700 Insomnia, unspecified: Secondary | ICD-10-CM | POA: Insufficient documentation

## 2022-01-12 DIAGNOSIS — R059 Cough, unspecified: Secondary | ICD-10-CM

## 2022-01-12 DIAGNOSIS — Z9989 Dependence on other enabling machines and devices: Secondary | ICD-10-CM

## 2022-01-12 DIAGNOSIS — E559 Vitamin D deficiency, unspecified: Secondary | ICD-10-CM | POA: Diagnosis not present

## 2022-01-12 DIAGNOSIS — F5101 Primary insomnia: Secondary | ICD-10-CM

## 2022-01-12 DIAGNOSIS — R202 Paresthesia of skin: Secondary | ICD-10-CM | POA: Diagnosis not present

## 2022-01-12 DIAGNOSIS — D509 Iron deficiency anemia, unspecified: Secondary | ICD-10-CM | POA: Diagnosis not present

## 2022-01-12 DIAGNOSIS — R413 Other amnesia: Secondary | ICD-10-CM | POA: Diagnosis not present

## 2022-01-12 LAB — CBC WITH DIFFERENTIAL/PLATELET
Basophils Absolute: 0.1 10*3/uL (ref 0.0–0.1)
Basophils Relative: 0.9 % (ref 0.0–3.0)
Eosinophils Absolute: 0.2 10*3/uL (ref 0.0–0.7)
Eosinophils Relative: 2.9 % (ref 0.0–5.0)
HCT: 32.2 % — ABNORMAL LOW (ref 36.0–46.0)
Hemoglobin: 9.9 g/dL — ABNORMAL LOW (ref 12.0–15.0)
Lymphocytes Relative: 41 % (ref 12.0–46.0)
Lymphs Abs: 2.8 10*3/uL (ref 0.7–4.0)
MCHC: 30.8 g/dL (ref 30.0–36.0)
MCV: 83.5 fl (ref 78.0–100.0)
Monocytes Absolute: 0.6 10*3/uL (ref 0.1–1.0)
Monocytes Relative: 9.4 % (ref 3.0–12.0)
Neutro Abs: 3.1 10*3/uL (ref 1.4–7.7)
Neutrophils Relative %: 45.8 % (ref 43.0–77.0)
Platelets: 491 10*3/uL — ABNORMAL HIGH (ref 150.0–400.0)
RBC: 3.86 Mil/uL — ABNORMAL LOW (ref 3.87–5.11)
RDW: 17.4 % — ABNORMAL HIGH (ref 11.5–15.5)
WBC: 6.8 10*3/uL (ref 4.0–10.5)

## 2022-01-12 LAB — IRON,TIBC AND FERRITIN PANEL
%SAT: 6 % (calc) — ABNORMAL LOW (ref 16–45)
Ferritin: 3 ng/mL — ABNORMAL LOW (ref 16–232)
Iron: 26 ug/dL — ABNORMAL LOW (ref 45–160)
TIBC: 434 mcg/dL (calc) (ref 250–450)

## 2022-01-12 LAB — POC COVID19 BINAXNOW: SARS Coronavirus 2 Ag: NEGATIVE

## 2022-01-12 LAB — VITAMIN B12: Vitamin B-12: 405 pg/mL (ref 211–911)

## 2022-01-12 LAB — VITAMIN D 25 HYDROXY (VIT D DEFICIENCY, FRACTURES): VITD: 48 ng/mL (ref 30.00–100.00)

## 2022-01-12 LAB — HEMOGLOBIN A1C: Hgb A1c MFr Bld: 5.7 % (ref 4.6–6.5)

## 2022-01-12 NOTE — Assessment & Plan Note (Signed)
OSA on CPAP x 10 years - pulm f/u ?

## 2022-01-12 NOTE — Progress Notes (Addendum)
Subjective:  Patient ID: Samantha [name], female    DOB: Mar 05, 1966  Age: 56 y.o. MRN: [id number]  CC: Nasal Congestion (Pt states for the past couple of days she has been having a runny nose, states she believes she is about to get sick.) and Insomnia (Pt has been having trouble sleeping for months now. Has tried OTC but would like to discuss something else.)   HPI JENAH ELLINGTON presents for not feeling well - as above x 2 d.  F/u chronic pain, depression, insomnia, memory loss  Outpatient Medications Prior to Visit  Medication Sig Dispense Refill   aspirin 81 MG tablet Take 81 mg by mouth daily.     atorvastatin (LIPITOR) 20 MG tablet TAKE 1 TABLET EVERY DAY AT 6 PM 90 tablet 3   busPIRone (BUSPAR) 10 MG tablet TAKE 1 TABLET THREE TIMES DAILY 270 tablet 1   Calcium Carbonate-Vitamin D (CALCIUM-VITAMIN D) 500-200 MG-UNIT per tablet Take 1 tablet by mouth 2 (two) times daily with a meal.     Docusate Calcium (STOOL SOFTENER PO) Take by mouth 2 (two) times daily.     donepezil (ARICEPT) 10 MG tablet TAKE 1 TABLET (10 MG TOTAL) AT BEDTIME (FOLLOW UP APPOINTMENT IS DUE, MUST SEE PROVIDER FOR REFILLS) 90 tablet 0   DULoxetine (CYMBALTA) 60 MG capsule TAKE 1 CAPSULE EVERY DAY 90 capsule 3   famotidine (PEPCID) 40 MG tablet TAKE 1 TABLET EVERY DAY 90 tablet 3   gabapentin (NEURONTIN) 300 MG capsule Take 300 mg by mouth 3 (three) times daily.     lactulose (CHRONULAC) 10 GM/15ML solution Take 30-45 mLs (20-30 g total) by mouth 2 (two) times daily as needed for moderate constipation or severe constipation. 946 mL 5   lidocaine (LIDODERM) 5 % Place 1 patch onto the skin daily. Remove & Discard patch within 12 hours or as directed by MD 180 patch 1   LINZESS 290 MCG CAPS capsule TAKE 1 CAPSULE DAILY BEFORE BREAKFAST 30 capsule 5   meloxicam (MOBIC) 15 MG tablet TAKE 1/2 TO 1 TABLET BY MOUTH DAILY AS NEEDED FOR PAIN 90 tablet 1   memantine (NAMENDA) 5 MG tablet Take 1 tablet (5 mg total) by mouth  2 (two) times daily. 180 tablet 3   morphine (MS CONTIN) 30 MG 12 hr tablet Take 30 mg by mouth every 12 (twelve) hours.     naloxone (NARCAN) nasal spray 4 mg/0.1 mL Place into the nose.     Oxycodone HCl 20 MG TABS Take 1 tablet by mouth every 6 (six) hours.     Probiotic Product (ALIGN) 4 MG CAPS Take 1 capsule (4 mg total) by mouth daily. 90 capsule 1   lamoTRIgine (LAMICTAL) 100 MG tablet TAKE 1 TABLET EVERY MORNING, AND 1 AND 1/2 TABLETS AT NIGHT. 225 tablet 0   No facility-administered medications prior to visit.    ROS: Review of Systems  Constitutional:  Positive for fatigue. Negative for activity change, appetite change, chills and unexpected weight change.  HENT:  Negative for congestion, mouth sores and sinus pressure.   Eyes:  Negative for visual disturbance.  Respiratory:  Negative for cough and chest tightness.   Gastrointestinal:  Negative for abdominal pain and nausea.  Genitourinary:  Negative for difficulty urinating, frequency and vaginal pain.  Musculoskeletal:  Positive for back pain and gait problem.  Skin:  Negative for pallor and rash.  Neurological:  Positive for headaches. Negative for dizziness, tremors, weakness and numbness.  Psychiatric/Behavioral:  Negative for confusion, sleep disturbance and suicidal ideas. The patient is nervous/anxious.    Objective:  BP (!) 162/94 (BP Location: Left Arm, Patient Position: Sitting, Cuff Size: Large)    Pulse 79    Temp 98.9 F (37.2 C) (Oral)    Ht 4\' 10"  (1.473 m)    Wt 146 lb (66.2 kg)    SpO2 97%    BMI 30.51 kg/m   BP Readings from Last 3 Encounters:  01/12/22 (!) 162/94  09/02/21 (!) 162/90  09/02/21 (!) 162/90    Wt Readings from Last 3 Encounters:  01/12/22 146 lb (66.2 kg)  09/02/21 147 lb 11.3 oz (67 kg)  09/02/21 147 lb 9.6 oz (67 kg)    Physical Exam Constitutional:      General: She is not in acute distress.    Appearance: She is well-developed.  HENT:     Head: Normocephalic.     Right  Ear: External ear normal.     Left Ear: External ear normal.     Nose: Nose normal.  Eyes:     General:        Right eye: No discharge.        Left eye: No discharge.     Conjunctiva/sclera: Conjunctivae normal.     Pupils: Pupils are equal, round, and reactive to light.  Neck:     Thyroid: No thyromegaly.     Vascular: No JVD.     Trachea: No tracheal deviation.  Cardiovascular:     Rate and Rhythm: Normal rate and regular rhythm.     Heart sounds: Normal heart sounds.  Pulmonary:     Effort: No respiratory distress.     Breath sounds: No stridor. No wheezing.  Abdominal:     General: Bowel sounds are normal. There is no distension.     Palpations: Abdomen is soft. There is no mass.     Tenderness: There is no abdominal tenderness. There is no guarding or rebound.  Musculoskeletal:        General: No tenderness.     Cervical back: Normal range of motion and neck supple. No rigidity.  Lymphadenopathy:     Cervical: No cervical adenopathy.  Skin:    Findings: No erythema or rash.  Neurological:     Mental Status: She is oriented to person, place, and time.     Cranial Nerves: No cranial nerve deficit.     Motor: No abnormal muscle tone.     Coordination: Coordination abnormal.     Deep Tendon Reflexes: Reflexes normal.  Psychiatric:        Behavior: Behavior normal.        Thought Content: Thought content normal.        Judgment: Judgment normal.    Lab Results  Component Value Date   WBC 6.8 01/12/2022   HGB 9.9 (L) 01/12/2022   HCT 32.2 (L) 01/12/2022   PLT 491.0 (H) 01/12/2022   GLUCOSE 92 09/02/2021   CHOL 217 (H) 09/02/2021   TRIG 204.0 (H) 09/02/2021   HDL 52.80 09/02/2021   LDLDIRECT 120.0 09/02/2021   LDLCALC 85 07/16/2020   ALT 31 09/02/2021   AST 35 09/02/2021   NA 139 09/02/2021   K 3.8 09/02/2021   CL 100 09/02/2021   CREATININE 0.98 09/02/2021   BUN 12 09/02/2021   CO2 34 (H) 09/02/2021   TSH 3.09 09/02/2021   HGBA1C 5.7 01/12/2022    US  BREAST LTD UNI RIGHT INC AXILLA  Result  Date: 07/09/2020 CLINICAL DATA:  56 year old female presenting for evaluation of 2 palpable lumps in the right breast at 7 o'clock. EXAM: DIGITAL DIAGNOSTIC BILATERAL MAMMOGRAM WITH TOMO AND CAD; ULTRASOUND RIGHT BREAST LIMITED COMPARISON:  Previous exam(s). ACR Breast Density Category c: The breast tissue is heterogeneously dense, which may obscure small masses. FINDINGS: A BB has been placed at the palpable site of concern on the lower-inner quadrant of the right breast. No suspicious mammographic findings are identified deep to the palpable marker. The patient does have an peripherally calcified catheter fragment from a VP shunt more medial to the marker. No other suspicious calcifications, masses or areas of distortion are seen in the bilateral breasts. Mammographic images were processed with CAD. Ultrasound targeted to the palpable site in the right breast at 4 o'clock, 7 cm from the nipple demonstrates a tubular structure with peripheral calcifications compatible with the patient seen mammographically. No other masses or suspicious areas of shadowing are identified. IMPRESSION: 1. The palpable site in the medial right breast appears to correspond with the patient's VP shunt catheter fragment. No other mammographic or targeted sonographic abnormalities are identified. 2. No suspicious calcifications, masses or areas of distortion are seen in the bilateral breasts. RECOMMENDATION: Screening mammogram in one year.(Code:SM-B-01Y) I have discussed the findings and recommendations with the patient. If applicable, a reminder letter will be sent to the patient regarding the next appointment. BI-RADS CATEGORY  1: Negative. Electronically Signed   By: Ammie Ferrier M.D.   On: 07/09/2020 15:23   MM DIAG BREAST TOMO BILATERAL  Result Date: 07/09/2020 CLINICAL DATA:  56 year old female presenting for evaluation of 2 palpable lumps in the right breast at 7 o'clock. EXAM:  DIGITAL DIAGNOSTIC BILATERAL MAMMOGRAM WITH TOMO AND CAD; ULTRASOUND RIGHT BREAST LIMITED COMPARISON:  Previous exam(s). ACR Breast Density Category c: The breast tissue is heterogeneously dense, which may obscure small masses. FINDINGS: A BB has been placed at the palpable site of concern on the lower-inner quadrant of the right breast. No suspicious mammographic findings are identified deep to the palpable marker. The patient does have an peripherally calcified catheter fragment from a VP shunt more medial to the marker. No other suspicious calcifications, masses or areas of distortion are seen in the bilateral breasts. Mammographic images were processed with CAD. Ultrasound targeted to the palpable site in the right breast at 4 o'clock, 7 cm from the nipple demonstrates a tubular structure with peripheral calcifications compatible with the patient seen mammographically. No other masses or suspicious areas of shadowing are identified. IMPRESSION: 1. The palpable site in the medial right breast appears to correspond with the patient's VP shunt catheter fragment. No other mammographic or targeted sonographic abnormalities are identified. 2. No suspicious calcifications, masses or areas of distortion are seen in the bilateral breasts. RECOMMENDATION: Screening mammogram in one year.(Code:SM-B-01Y) I have discussed the findings and recommendations with the patient. If applicable, a reminder letter will be sent to the patient regarding the next appointment. BI-RADS CATEGORY  1: Negative. Electronically Signed   By: Ammie Ferrier M.D.   On: 07/09/2020 15:23    Assessment & Plan:   Problem List Items Addressed This Visit     Anemia    Start oral iron      Relevant Medications   ferrous sulfate 325 (65 FE) MG tablet   Other Relevant Orders   CBC with Differential/Platelet (Completed)   Iron, TIBC and Ferritin Panel (Completed)   Hemoglobin A1c (Completed)   Chronic anxiety  Walk more Valerian root  prn      Cough    ?URI COVID test      Insomnia - Primary    Walk more Tylenol pm Valerian root for insomnia OSA on CPAP x 10 years - Dr Annamaria Boots      Relevant Orders   Ambulatory referral to Pulmonology   Memory loss    Walk more  Valerian root for insomnia      OSA on CPAP    OSA on CPAP x 10 years - pulm f/u      Other Visit Diagnoses     Nasal congestion       Relevant Orders   POC COVID-19 (Completed)   Runny nose       Relevant Orders   POC COVID-19 (Completed)   Paresthesia       Relevant Orders   Vitamin B12 (Completed)   Vitamin D deficiency       Relevant Orders   VITAMIN D 25 Hydroxy (Vit-D Deficiency, Fractures) (Completed)         Meds ordered this encounter  Medications   ferrous sulfate 325 (65 FE) MG tablet    Sig: Take 1 tablet (325 mg total) by mouth daily.    Dispense:  30 tablet    Refill:  6      Follow-up: Return in about 3 months (around 04/14/2022) for a follow-up visit.  Walker Kehr, MD

## 2022-01-12 NOTE — Assessment & Plan Note (Addendum)
Walk more ?Tylenol pm ?Valerian root for insomnia ?OSA on CPAP x 10 years - Dr Maple Hudson ?

## 2022-01-12 NOTE — Assessment & Plan Note (Signed)
?  URI ?COVID test ?

## 2022-01-12 NOTE — Assessment & Plan Note (Signed)
Walk more ?Valerian root prn ?

## 2022-01-12 NOTE — Assessment & Plan Note (Signed)
Walk more ? ?Valerian root for insomnia ?

## 2022-01-14 ENCOUNTER — Other Ambulatory Visit: Payer: Self-pay | Admitting: Internal Medicine

## 2022-01-16 MED ORDER — FERROUS SULFATE 325 (65 FE) MG PO TABS: 325.0000 mg | ORAL_TABLET | Freq: Every day | ORAL | 6 refills | Status: DC

## 2022-01-16 NOTE — Assessment & Plan Note (Signed)
Start oral iron °

## 2022-01-16 NOTE — Addendum Note (Signed)
Addended by: Tresa Garter on: 01/16/2022 10:56 PM ? ? Modules accepted: Orders ? ?

## 2022-01-29 ENCOUNTER — Other Ambulatory Visit: Payer: Self-pay | Admitting: Internal Medicine

## 2022-01-31 DIAGNOSIS — M47816 Spondylosis without myelopathy or radiculopathy, lumbar region: Secondary | ICD-10-CM | POA: Diagnosis not present

## 2022-01-31 DIAGNOSIS — M5116 Intervertebral disc disorders with radiculopathy, lumbar region: Secondary | ICD-10-CM | POA: Diagnosis not present

## 2022-02-03 ENCOUNTER — Encounter: Payer: Self-pay | Admitting: Family Medicine

## 2022-02-03 ENCOUNTER — Other Ambulatory Visit: Payer: Self-pay

## 2022-02-03 ENCOUNTER — Ambulatory Visit (INDEPENDENT_AMBULATORY_CARE_PROVIDER_SITE_OTHER): Payer: Medicare HMO | Admitting: Family Medicine

## 2022-02-03 VITALS — BP 132/82 | HR 80 | Temp 98.3°F | Ht <= 58 in | Wt 144.2 lb

## 2022-02-03 DIAGNOSIS — J3489 Other specified disorders of nose and nasal sinuses: Secondary | ICD-10-CM

## 2022-02-03 DIAGNOSIS — Z20822 Contact with and (suspected) exposure to covid-19: Secondary | ICD-10-CM | POA: Diagnosis not present

## 2022-02-03 LAB — POC COVID19 BINAXNOW: SARS Coronavirus 2 Ag: NEGATIVE

## 2022-02-03 NOTE — Progress Notes (Signed)
? ?  Subjective:  ? ? Patient ID: Samantha [name], female    DOB: 07/09/1966, 56 y.o.   MRN: [id number] ? ?HPI ?Chief Complaint  ?Patient presents with  ? Covid Exposure  ?  Husband tested positive for covid last week. Pt had symptoms last week but does not have any today, just needs antigen test and rapid test.  ? ?She is here requesting Covid testing so that she can return to in person appointments at her pain management clinic.  Denies any symptoms currently but states she had rhinorrhea and chest congestion for 2-3 days and her symptoms resolved. States her husband tested positive on 01/24/2022. ? ?No other concerns.  ? ?Denies fever, chills, dizziness, chest pain, palpitations, shortness of breath, abdominal pain, N/V/D, urinary symptoms, LE edema.  ? ?Reviewed allergies, medications, past medical, surgical, family, and social history. ? ? ? ?Review of Systems ?Pertinent positives and negatives in the history of present illness. ? ?   ?Objective:  ? Physical Exam ?BP 132/82 (BP Location: Left Arm, Patient Position: Sitting, Cuff Size: Large)   Pulse 80   Temp 98.3 ?F (36.8 ?C) (Oral)   Ht 4\' 10"  (1.473 m)   Wt 144 lb 3.2 oz (65.4 kg)   SpO2 98%   BMI 30.14 kg/m?  ? ?Alert and in no distress.  Neck is supple without adenopathy or thyromegaly. Cardiac exam shows a regular sinus rhythm without murmurs or gallops. Lungs are clear to auscultation. Skin is warm and dry.  ? ? ? ?   ?Assessment & Plan:  ?Rhinorrhea ? ?Exposure to COVID-19 virus - Plan: POC COVID-19, SARS-COV-2, NAA 2 DAY TAT ? ?Negative Covid testing. She appears to be back to her baseline. Letter will be provided for her to return to in person appointments at her pain management clinic as they requested.  ? ?

## 2022-02-04 DIAGNOSIS — M545 Low back pain, unspecified: Secondary | ICD-10-CM | POA: Diagnosis not present

## 2022-02-04 DIAGNOSIS — Z6830 Body mass index (BMI) 30.0-30.9, adult: Secondary | ICD-10-CM | POA: Diagnosis not present

## 2022-02-04 DIAGNOSIS — Z79899 Other long term (current) drug therapy: Secondary | ICD-10-CM | POA: Diagnosis not present

## 2022-02-04 DIAGNOSIS — G894 Chronic pain syndrome: Secondary | ICD-10-CM | POA: Diagnosis not present

## 2022-02-04 DIAGNOSIS — R030 Elevated blood-pressure reading, without diagnosis of hypertension: Secondary | ICD-10-CM | POA: Diagnosis not present

## 2022-02-05 NOTE — Progress Notes (Deleted)
HPI ?female never smoker followed for OSA, complicated by history of migraine, hydrocephalus/VP shunt/  and concern for nocturnal seizures, Hx CVA for which she is followed by Neurology, back pain ?NPSG 03/08/15 at [facility] with full seizure montage- AHI 12.1/ hr, desaturation to 91%, no seizure activity ? ? ?------------------------------------------------------------------------------------------------ ? ? ? ?03/15/17- 56 year old female never smoker followed by Dr. Craige Cotta for OSA, complicated by history of migraine, hydrocephalus/VP shunt and concern for nocturnal seizures, Hx CVA for which she is followed by Neurology, back pain ?CPAP auto 5-15/Advanced ?FOLLOWS FOR DME IS [facility]  Down load is attached patient wears cpap every night and with napping  ?Download 93%/4 hours, AHI 0.7/hour. ?Uses melatonin for sleep along with her Cymbalta and says this works well. Using CPAP routinely including with naps, taking a nap most afternoons. She is quite satisfied and definitely sleeps better with CPAP. Settled on a nasal mask. ? ?02/08/22- Coming to re-establish LOV 9043- 56 year old female never smoker previously  followed by Dr. Craige Cotta for OSA, complicated by Insomnia,  history of migraine, CVA, hydrocephalus/VP shunt and concern for nocturnal seizures, Hx CVA for which she is followed by Neurology, back pain, Allergic Rhinitis, Anxiety, GERD,  ?CPAP auto 5-15/Advanced ?Epworth scocre- ?Body weight today- ?Covid vax- ?Flu vax- ? ? ?ROS-see HPI   + = pos ?Constitutional:    weight loss, night sweats, fevers, chills, fatigue, lassitude. ?HEENT:    headaches, difficulty swallowing, tooth/dental problems, sore throat,  ?     sneezing, itching, ear ache, nasal congestion, post nasal drip, snoring ?CV:    chest pain, orthopnea, PND, swelling in lower extremities, anasarca,                                  ?                       dizziness, palpitations ?Resp:   shortness of breath with exertion or at rest.   ?              productive cough,   non-productive cough, coughing up of blood.   ?           change in color of mucus.  wheezing.   ?Skin:    rash or lesions. ?GI:  No-   heartburn, indigestion, abdominal pain, nausea, vomiting, diarrhea,  ?               change in bowel habits, loss of appetite ?GU: dysuria, change in color of urine, no urgency or frequency.   flank pain. ?MS:   joint pain, stiffness, decreased range of motion, back pain. ?Neuro-     nothing unusual ?Psych:  change in mood or affect.  depression or anxiety.   memory loss. ? ?OBJ- Physical Exam           stable baseline exam ?General- Alert, Oriented, Affect-appropriate, Distress- none acute ?Skin- rash-none, lesions- none, excoriation- none ?Lymphadenopathy- none ?Head- atraumatic, + R VP shunt ?           Eyes- Gross vision intact, PERRLA, conjunctivae and secretions clear ?           Ears- Hearing, canals-normal ?           Nose- Clear, no-Septal dev, mucus, polyps, erosion, perforation  ?           Throat- Mallampati III-IV , mucosa clear , drainage- none, tonsils- atrophic ?  Neck- flexible , trachea midline, no stridor , thyroid nl, carotid no bruit ?Chest - symmetrical excursion , unlabored ?          Heart/CV- RRR , no murmur , no gallop  , no rub, nl s1 s2 ?                          - JVD- none , edema- none, stasis changes- none, varices- none ?          Lung- clear to P&A, wheeze- none, cough- none , dullness-none, rub- none ?          Chest wall-  ?Abd-  ?Br/ Gen/ Rectal- Not done, not indicated ?Extrem- cyanosis- none, clubbing, none, atrophy- none, strength- nl ?Neuro- grossly intact to observation ? ? ?

## 2022-02-08 ENCOUNTER — Institutional Professional Consult (permissible substitution): Payer: Medicare HMO | Admitting: Internal Medicine

## 2022-02-09 LAB — NOVEL CORONAVIRUS, NAA

## 2022-02-09 LAB — SPECIMEN STATUS REPORT

## 2022-02-10 DIAGNOSIS — Z79899 Other long term (current) drug therapy: Secondary | ICD-10-CM | POA: Diagnosis not present

## 2022-02-22 ENCOUNTER — Other Ambulatory Visit: Payer: Self-pay | Admitting: Internal Medicine

## 2022-03-03 ENCOUNTER — Institutional Professional Consult (permissible substitution): Payer: Medicare HMO | Admitting: Adult Health

## 2022-03-04 DIAGNOSIS — G894 Chronic pain syndrome: Secondary | ICD-10-CM | POA: Diagnosis not present

## 2022-03-04 DIAGNOSIS — Z6831 Body mass index (BMI) 31.0-31.9, adult: Secondary | ICD-10-CM | POA: Diagnosis not present

## 2022-03-04 DIAGNOSIS — M545 Low back pain, unspecified: Secondary | ICD-10-CM | POA: Diagnosis not present

## 2022-03-04 DIAGNOSIS — R030 Elevated blood-pressure reading, without diagnosis of hypertension: Secondary | ICD-10-CM | POA: Diagnosis not present

## 2022-03-04 DIAGNOSIS — Z79899 Other long term (current) drug therapy: Secondary | ICD-10-CM | POA: Diagnosis not present

## 2022-03-08 DIAGNOSIS — Z79899 Other long term (current) drug therapy: Secondary | ICD-10-CM | POA: Diagnosis not present

## 2022-03-09 ENCOUNTER — Ambulatory Visit (INDEPENDENT_AMBULATORY_CARE_PROVIDER_SITE_OTHER): Payer: Medicare HMO | Admitting: Internal Medicine

## 2022-03-09 ENCOUNTER — Encounter: Payer: Self-pay | Admitting: Internal Medicine

## 2022-03-09 DIAGNOSIS — M545 Low back pain, unspecified: Secondary | ICD-10-CM

## 2022-03-09 DIAGNOSIS — M79605 Pain in left leg: Secondary | ICD-10-CM

## 2022-03-09 DIAGNOSIS — G959 Disease of spinal cord, unspecified: Secondary | ICD-10-CM | POA: Insufficient documentation

## 2022-03-09 DIAGNOSIS — I10 Essential (primary) hypertension: Secondary | ICD-10-CM | POA: Insufficient documentation

## 2022-03-09 DIAGNOSIS — M419 Scoliosis, unspecified: Secondary | ICD-10-CM | POA: Insufficient documentation

## 2022-03-09 LAB — COMPREHENSIVE METABOLIC PANEL
ALT: 104 U/L — ABNORMAL HIGH (ref 0–35)
AST: 85 U/L — ABNORMAL HIGH (ref 0–37)
Albumin: 4.4 g/dL (ref 3.5–5.2)
Alkaline Phosphatase: 107 U/L (ref 39–117)
BUN: 20 mg/dL (ref 6–23)
CO2: 32 mEq/L (ref 19–32)
Calcium: 9.7 mg/dL (ref 8.4–10.5)
Chloride: 102 mEq/L (ref 96–112)
Creatinine, Ser: 1.15 mg/dL (ref 0.40–1.20)
GFR: 52.73 mL/min — ABNORMAL LOW (ref >60.00)
Glucose, Bld: 85 mg/dL (ref 70–99)
Potassium: 4.4 mEq/L (ref 3.5–5.1)
Sodium: 141 mEq/L (ref 135–145)
Total Bilirubin: 0.4 mg/dL (ref 0.2–1.2)
Total Protein: 7.3 g/dL (ref 6.0–8.3)

## 2022-03-09 MED ORDER — DONEPEZIL HCL 10 MG PO TABS: ORAL_TABLET | ORAL | 3 refills | Status: DC

## 2022-03-09 MED ORDER — OLMESARTAN MEDOXOMIL 20 MG PO TABS: 20.0000 mg | ORAL_TABLET | Freq: Every day | ORAL | 3 refills | Status: DC

## 2022-03-09 NOTE — Assessment & Plan Note (Signed)
C/o elevated BP. SBP 160-180 at home and w/Pain clinic ?Start Benicar 20 mg/d ?NAS diet ?Walk more ?

## 2022-03-09 NOTE — Progress Notes (Addendum)
? ?Subjective:  ?Patient ID: Samantha [name], female    DOB: 05/03/1966  Age: 56 y.o. MRN: [id number] ? ?CC: No chief complaint on file. ? ? ?HPI ?Samantha [name] presents for elevated BP ?SBP 160-180 at home and w/Pain clinic ?C/o chronic pain  ?She is here w/her friend Butch Penny who helps w/history ? ?Outpatient Medications Prior to Visit  ?Medication Sig Dispense Refill  ? aspirin 81 MG tablet Take 81 mg by mouth daily.    ? atorvastatin (LIPITOR) 20 MG tablet TAKE 1 TABLET EVERY DAY AT 6 PM 90 tablet 3  ? busPIRone (BUSPAR) 10 MG tablet TAKE 1 TABLET THREE TIMES DAILY 270 tablet 1  ? Calcium Carbonate-Vitamin D (CALCIUM-VITAMIN D) 500-200 MG-UNIT per tablet Take 1 tablet by mouth 2 (two) times daily with a meal.    ? Docusate Calcium (STOOL SOFTENER PO) Take by mouth 2 (two) times daily.    ? DULoxetine (CYMBALTA) 60 MG capsule TAKE 1 CAPSULE EVERY DAY 90 capsule 3  ? famotidine (PEPCID) 40 MG tablet TAKE 1 TABLET EVERY DAY 90 tablet 3  ? ferrous sulfate 325 (65 FE) MG tablet Take 1 tablet (325 mg total) by mouth daily. 30 tablet 6  ? gabapentin (NEURONTIN) 300 MG capsule Take 300 mg by mouth 3 (three) times daily.    ? lactulose (CHRONULAC) 10 GM/15ML solution Take 30-45 mLs (20-30 g total) by mouth 2 (two) times daily as needed for moderate constipation or severe constipation. 946 mL 5  ? lamoTRIgine (LAMICTAL) 100 MG tablet TAKE 1 TABLET EVERY MORNING, AND 1 AND 1/2 TABLETS AT NIGHT. 225 tablet 0  ? lidocaine (LIDODERM) 5 % Place 1 patch onto the skin daily. Remove & Discard patch within 12 hours or as directed by MD 180 patch 1  ? LINZESS 290 MCG CAPS capsule TAKE 1 CAPSULE  DAILY BEFORE BREAKFAST 90 capsule 0  ? meloxicam (MOBIC) 15 MG tablet TAKE 1/2 TO 1 TABLET BY MOUTH DAILY AS NEEDED FOR PAIN 90 tablet 1  ? memantine (NAMENDA) 5 MG tablet Take 1 tablet (5 mg total) by mouth 2 (two) times daily. 180 tablet 3  ? morphine (MS CONTIN) 30 MG 12 hr tablet Take 30 mg by mouth every 12 (twelve) hours.    ? naloxone  (NARCAN) nasal spray 4 mg/0.1 mL Place into the nose.    ? Oxycodone HCl 20 MG TABS Take 1 tablet by mouth every 6 (six) hours.    ? Probiotic Product (ALIGN) 4 MG CAPS Take 1 capsule (4 mg total) by mouth daily. 90 capsule 1  ? promethazine (PHENERGAN) 12.5 MG tablet Take by mouth.    ? donepezil (ARICEPT) 10 MG tablet TAKE 1 TABLET (10 MG TOTAL) AT BEDTIME (FOLLOW UP APPOINTMENT IS DUE, MUST SEE PROVIDER FOR REFILLS) 90 tablet 0  ? ?No facility-administered medications prior to visit.  ? ? ?ROS: ?Review of Systems  ?Constitutional:  Positive for fatigue. Negative for activity change, appetite change, chills and unexpected weight change.  ?HENT:  Negative for congestion, mouth sores and sinus pressure.   ?Eyes:  Negative for visual disturbance.  ?Respiratory:  Negative for cough and chest tightness.   ?Gastrointestinal:  Negative for abdominal pain and nausea.  ?Genitourinary:  Negative for difficulty urinating, frequency and vaginal pain.  ?Musculoskeletal:  Positive for arthralgias and back pain. Negative for gait problem.  ?Skin:  Negative for pallor and rash.  ?Neurological:  Negative for dizziness, tremors, weakness, numbness and headaches.  ?Psychiatric/Behavioral:  Negative for  confusion, sleep disturbance and suicidal ideas. The patient is nervous/anxious.   ? ?Objective:  ?BP (!) 156/100 (BP Location: Left Arm, Patient Position: Sitting, Cuff Size: Normal)   Pulse 97   Temp 99.2 ?F (37.3 ?C) (Oral)   Ht 4\' 10"  (1.473 m)   Wt 150 lb (68 kg)   SpO2 96%   BMI 31.35 kg/m?  ? ?BP Readings from Last 3 Encounters:  ?03/09/22 (!) 156/100  ?02/03/22 132/82  ?01/12/22 (!) 162/94  ? ? ?Wt Readings from Last 3 Encounters:  ?03/09/22 150 lb (68 kg)  ?02/03/22 144 lb 3.2 oz (65.4 kg)  ?01/12/22 146 lb (66.2 kg)  ? ? ?Physical Exam ?Constitutional:   ?   General: She is not in acute distress. ?   Appearance: She is well-developed.  ?HENT:  ?   Head: Normocephalic.  ?   Right Ear: External ear normal.  ?   Left Ear:  External ear normal.  ?   Nose: Nose normal.  ?Eyes:  ?   General:     ?   Right eye: No discharge.     ?   Left eye: No discharge.  ?   Conjunctiva/sclera: Conjunctivae normal.  ?   Pupils: Pupils are equal, round, and reactive to light.  ?Neck:  ?   Thyroid: No thyromegaly.  ?   Vascular: No JVD.  ?   Trachea: No tracheal deviation.  ?Cardiovascular:  ?   Rate and Rhythm: Normal rate and regular rhythm.  ?   Heart sounds: Normal heart sounds.  ?Pulmonary:  ?   Effort: No respiratory distress.  ?   Breath sounds: No stridor. No wheezing.  ?Abdominal:  ?   General: Bowel sounds are normal. There is no distension.  ?   Palpations: Abdomen is soft. There is no mass.  ?   Tenderness: There is no abdominal tenderness. There is no guarding or rebound.  ?Musculoskeletal:     ?   General: No tenderness.  ?   Cervical back: Normal range of motion and neck supple. No rigidity.  ?Lymphadenopathy:  ?   Cervical: No cervical adenopathy.  ?Skin: ?   Findings: No erythema or rash.  ?Neurological:  ?   Cranial Nerves: No cranial nerve deficit.  ?   Motor: No abnormal muscle tone.  ?   Coordination: Coordination normal.  ?   Gait: Gait abnormal.  ?   Deep Tendon Reflexes: Reflexes normal.  ?Psychiatric:     ?   Behavior: Behavior normal.     ?   Thought Content: Thought content normal.     ?   Judgment: Judgment normal.  ?Using a cane ? ? ? A total time of 30 minutes was spent preparing to see the patient, reviewing tests, x-rays, operative reports and other medical records.  Also, obtaining history and performing comprehensive physical exam.  Additionally, counseling the patient regarding the above listed issues - new HTN, pain. ? ? ?Lab Results  ?Component Value Date  ? WBC 6.8 01/12/2022  ? HGB 9.9 (L) 01/12/2022  ? HCT 32.2 (L) 01/12/2022  ? PLT 491.0 (H) 01/12/2022  ? GLUCOSE 92 09/02/2021  ? CHOL 217 (H) 09/02/2021  ? TRIG 204.0 (H) 09/02/2021  ? HDL 52.80 09/02/2021  ? LDLDIRECT 120.0 09/02/2021  ? [place] 85 07/16/2020  ?  ALT 31 09/02/2021  ? AST 35 09/02/2021  ? NA 139 09/02/2021  ? K 3.8 09/02/2021  ? CL 100 09/02/2021  ? CREATININE 0.98 09/02/2021  ?  BUN 12 09/02/2021  ? CO2 34 (H) 09/02/2021  ? TSH 3.09 09/02/2021  ? HGBA1C 5.7 01/12/2022  ? ? ?US BREAST LTD UNI RIGHT INC AXILLA ? ?Result Date: 07/09/2020 ?CLINICAL DATA:  57 year old female presenting for evaluation of 2 palpable lumps in the right breast at 7 o'clock. EXAM: DIGITAL DIAGNOSTIC BILATERAL MAMMOGRAM WITH TOMO AND CAD; ULTRASOUND RIGHT BREAST LIMITED COMPARISON:  Previous exam(s). ACR Breast Density Category c: The breast tissue is heterogeneously dense, which may obscure small masses. FINDINGS: A BB has been placed at the palpable site of concern on the lower-inner quadrant of the right breast. No suspicious mammographic findings are identified deep to the palpable marker. The patient does have an peripherally calcified catheter fragment from a VP shunt more medial to the marker. No other suspicious calcifications, masses or areas of distortion are seen in the bilateral breasts. Mammographic images were processed with CAD. Ultrasound targeted to the palpable site in the right breast at 4 o'clock, 7 cm from the nipple demonstrates a tubular structure with peripheral calcifications compatible with the patient seen mammographically. No other masses or suspicious areas of shadowing are identified. IMPRESSION: 1. The palpable site in the medial right breast appears to correspond with the patient's VP shunt catheter fragment. No other mammographic or targeted sonographic abnormalities are identified. 2. No suspicious calcifications, masses or areas of distortion are seen in the bilateral breasts. RECOMMENDATION: Screening mammogram in one year.(Code:SM-B-01Y) I have discussed the findings and recommendations with the patient. If applicable, a reminder letter will be sent to the patient regarding the next appointment. BI-RADS CATEGORY  1: Negative. Electronically Signed    By: Ammie Ferrier M.D.   On: 07/09/2020 15:23  ? ?MM DIAG BREAST TOMO BILATERAL ? ?Result Date: 07/09/2020 ?CLINICAL DATA:  56 year old female presenting for evaluation of 2 palpable lumps in the right br

## 2022-03-09 NOTE — Assessment & Plan Note (Signed)
Pain meds per Pain Clinic ?She is asking about a referral to another Pain Clinic - we will do a referral when they let me know ? ? ?

## 2022-03-10 ENCOUNTER — Institutional Professional Consult (permissible substitution): Payer: Medicare HMO | Admitting: Adult Health

## 2022-03-22 ENCOUNTER — Telehealth: Payer: Self-pay | Admitting: Primary Care

## 2022-03-23 NOTE — Telephone Encounter (Signed)
Called patient but she did not answer. Left message for her to call back.  

## 2022-03-24 ENCOUNTER — Institutional Professional Consult (permissible substitution): Payer: Medicare HMO | Admitting: Adult Health

## 2022-03-31 ENCOUNTER — Other Ambulatory Visit: Payer: Self-pay | Admitting: Internal Medicine

## 2022-04-01 DIAGNOSIS — R030 Elevated blood-pressure reading, without diagnosis of hypertension: Secondary | ICD-10-CM | POA: Diagnosis not present

## 2022-04-01 DIAGNOSIS — Z79899 Other long term (current) drug therapy: Secondary | ICD-10-CM | POA: Diagnosis not present

## 2022-04-01 DIAGNOSIS — Z6831 Body mass index (BMI) 31.0-31.9, adult: Secondary | ICD-10-CM | POA: Diagnosis not present

## 2022-04-01 DIAGNOSIS — M545 Low back pain, unspecified: Secondary | ICD-10-CM | POA: Diagnosis not present

## 2022-04-01 DIAGNOSIS — G894 Chronic pain syndrome: Secondary | ICD-10-CM | POA: Diagnosis not present

## 2022-04-06 ENCOUNTER — Telehealth: Payer: Self-pay | Admitting: Internal Medicine

## 2022-04-06 DIAGNOSIS — Z79899 Other long term (current) drug therapy: Secondary | ICD-10-CM | POA: Diagnosis not present

## 2022-04-06 NOTE — Telephone Encounter (Signed)
LVM for pt to rtn my call to schedule AWV with [facility]. Call back # [phone] 

## 2022-04-15 ENCOUNTER — Other Ambulatory Visit: Payer: Self-pay | Admitting: Internal Medicine

## 2022-04-15 ENCOUNTER — Ambulatory Visit (INDEPENDENT_AMBULATORY_CARE_PROVIDER_SITE_OTHER): Payer: Medicare HMO | Admitting: Internal Medicine

## 2022-04-15 DIAGNOSIS — N390 Urinary tract infection, site not specified: Secondary | ICD-10-CM | POA: Insufficient documentation

## 2022-04-15 DIAGNOSIS — M79605 Pain in left leg: Secondary | ICD-10-CM

## 2022-04-15 DIAGNOSIS — M545 Low back pain, unspecified: Secondary | ICD-10-CM

## 2022-04-15 DIAGNOSIS — R410 Disorientation, unspecified: Secondary | ICD-10-CM

## 2022-04-15 LAB — COMPREHENSIVE METABOLIC PANEL
ALT: 92 U/L — ABNORMAL HIGH (ref 0–35)
AST: 39 U/L — ABNORMAL HIGH (ref 0–37)
Albumin: 3.9 g/dL (ref 3.5–5.2)
Alkaline Phosphatase: 80 U/L (ref 39–117)
BUN: 55 mg/dL — ABNORMAL HIGH (ref 6–23)
CO2: 24 mEq/L (ref 19–32)
Calcium: 9.2 mg/dL (ref 8.4–10.5)
Chloride: 103 mEq/L (ref 96–112)
Creatinine, Ser: 2.46 mg/dL — ABNORMAL HIGH (ref 0.40–1.20)
GFR: 21.16 mL/min — ABNORMAL LOW (ref >60.00)
Glucose, Bld: 108 mg/dL — ABNORMAL HIGH (ref 70–99)
Potassium: 4.5 mEq/L (ref 3.5–5.1)
Sodium: 136 mEq/L (ref 135–145)
Total Bilirubin: 0.4 mg/dL (ref 0.2–1.2)
Total Protein: 6.8 g/dL (ref 6.0–8.3)

## 2022-04-15 LAB — URINALYSIS, ROUTINE W REFLEX MICROSCOPIC
Bilirubin Urine: NEGATIVE
Hgb urine dipstick: NEGATIVE
Ketones, ur: NEGATIVE
Nitrite: NEGATIVE
RBC / HPF: NONE SEEN (ref 0–?)
Specific Gravity, Urine: 1.025 (ref 1.000–1.030)
Urine Glucose: NEGATIVE
Urobilinogen, UA: 0.2 (ref 0.0–1.0)
pH: 6 (ref 5.0–8.0)

## 2022-04-15 LAB — CBC WITH DIFFERENTIAL/PLATELET
Basophils Absolute: 0 10*3/uL (ref 0.0–0.1)
Basophils Relative: 0.3 % (ref 0.0–3.0)
Eosinophils Absolute: 0.1 10*3/uL (ref 0.0–0.7)
Eosinophils Relative: 0.9 % (ref 0.0–5.0)
HCT: 29.5 % — ABNORMAL LOW (ref 36.0–46.0)
Hemoglobin: 9.9 g/dL — ABNORMAL LOW (ref 12.0–15.0)
Lymphocytes Relative: 13.1 % (ref 12.0–46.0)
Lymphs Abs: 2.1 10*3/uL (ref 0.7–4.0)
MCHC: 33.4 g/dL (ref 30.0–36.0)
MCV: 92.7 fl (ref 78.0–100.0)
Monocytes Absolute: 1.3 10*3/uL — ABNORMAL HIGH (ref 0.1–1.0)
Monocytes Relative: 8 % (ref 3.0–12.0)
Neutro Abs: 12.5 10*3/uL — ABNORMAL HIGH (ref 1.4–7.7)
Neutrophils Relative %: 77.7 % — ABNORMAL HIGH (ref 43.0–77.0)
Platelets: 372 10*3/uL (ref 150.0–400.0)
RBC: 3.18 Mil/uL — ABNORMAL LOW (ref 3.87–5.11)
RDW: 17.4 % — ABNORMAL HIGH (ref 11.5–15.5)
WBC: 16.1 10*3/uL — ABNORMAL HIGH (ref 4.0–10.5)

## 2022-04-15 MED ORDER — LEVOFLOXACIN 500 MG PO TABS: 500.0000 mg | ORAL_TABLET | Freq: Every day | ORAL | 0 refills | Status: DC

## 2022-04-15 NOTE — Progress Notes (Signed)
Subjective:  Patient ID: Samantha [name], female    DOB: [birth year]  Age: 56 y.o. MRN: [id number]  CC: No chief complaint on file.   HPI Samantha [name] presents for confusion, mixing words, talking in bed all night, not making sense. She was lying on the floor (no fall or LOC).  [name] started to use CBD w/Delta 8 [name] is here w/her friend [name] who helps with history  Outpatient Medications Prior to Visit  Medication Sig Dispense Refill   aspirin 81 MG tablet Take 81 mg by mouth daily.     atorvastatin (LIPITOR) 20 MG tablet TAKE 1 TABLET EVERY DAY AT 6 PM 90 tablet 3   busPIRone (BUSPAR) 10 MG tablet TAKE 1 TABLET THREE TIMES DAILY 270 tablet 1   Calcium Carbonate-Vitamin D (CALCIUM-VITAMIN D) 500-200 MG-UNIT per tablet Take 1 tablet by mouth 2 (two) times daily with a meal.     Docusate Calcium (STOOL SOFTENER PO) Take by mouth 2 (two) times daily.     donepezil (ARICEPT) 10 MG tablet TAKE 1 TABLET AT BEDTIME 90 tablet 3   DULoxetine (CYMBALTA) 60 MG capsule TAKE 1 CAPSULE EVERY DAY 90 capsule 3   famotidine (PEPCID) 40 MG tablet TAKE 1 TABLET EVERY DAY 90 tablet 3   ferrous sulfate 325 (65 FE) MG tablet Take 1 tablet (325 mg total) by mouth daily. 30 tablet 6   gabapentin (NEURONTIN) 300 MG capsule Take 300 mg by mouth 3 (three) times daily.     lactulose (CHRONULAC) 10 GM/15ML solution Take 30-45 mLs (20-30 g total) by mouth 2 (two) times daily as needed for moderate constipation or severe constipation. 946 mL 5   lamoTRIgine (LAMICTAL) 100 MG tablet TAKE 1 TABLET EVERY MORNING, AND 1 AND 1/2 TABLETS AT NIGHT. 225 tablet 0   lidocaine (LIDODERM) 5 % Place 1 patch onto the skin daily. Remove & Discard patch within 12 hours or as directed by MD 180 patch 1   LINZESS 290 MCG CAPS capsule TAKE 1 CAPSULE  DAILY BEFORE BREAKFAST 90 capsule 0   meloxicam (MOBIC) 15 MG tablet TAKE 1/2 TO 1 TABLET BY MOUTH DAILY AS NEEDED FOR PAIN 90 tablet 1   memantine (NAMENDA) 5 MG tablet Take 1  tablet (5 mg total) by mouth 2 (two) times daily. 180 tablet 3   morphine (MS CONTIN) 30 MG 12 hr tablet Take 30 mg by mouth every 12 (twelve) hours.     naloxone (NARCAN) nasal spray 4 mg/0.1 mL Place into the nose.     olmesartan (BENICAR) 20 MG tablet Take 1 tablet (20 mg total) by mouth daily. 90 tablet 3   Oxycodone HCl 20 MG TABS Take 1 tablet by mouth every 6 (six) hours.     Probiotic Product (ALIGN) 4 MG CAPS Take 1 capsule (4 mg total) by mouth daily. 90 capsule 1   promethazine (PHENERGAN) 12.5 MG tablet Take by mouth.     No facility-administered medications prior to visit.    ROS: Review of Systems  Constitutional:  Negative for activity change, appetite change, chills, fatigue and unexpected weight change.  HENT:  Negative for congestion, mouth sores and sinus pressure.   Eyes:  Negative for visual disturbance.  Respiratory:  Negative for cough and chest tightness.   Gastrointestinal:  Negative for abdominal pain and nausea.  Genitourinary:  Negative for difficulty urinating, frequency and vaginal pain.  Musculoskeletal:  Positive for back pain. Negative for gait problem.  Skin:  Negative for  pallor and rash.  Neurological:  Negative for dizziness, tremors, weakness, numbness and headaches.  Psychiatric/Behavioral:  Positive for confusion and decreased concentration. Negative for sleep disturbance. The patient is not nervous/anxious.    Objective:  BP (!) 144/84 (BP Location: Left Arm, Patient Position: Sitting, Cuff Size: Large)   Pulse (!) 108   Temp 98 F (36.7 C) (Oral)   Ht 4\' 10"  (1.473 m)   Wt 154 lb (69.9 kg)   SpO2 96%   BMI 32.19 kg/m   BP Readings from Last 3 Encounters:  04/15/22 (!) 144/84  03/09/22 (!) 156/100  02/03/22 132/82    Wt Readings from Last 3 Encounters:  04/15/22 154 lb (69.9 kg)  03/09/22 150 lb (68 kg)  02/03/22 144 lb 3.2 oz (65.4 kg)    Physical Exam Constitutional:      General: She is not in acute distress.    Appearance:  She is well-developed. She is obese.  HENT:     Head: Normocephalic.     Right Ear: External ear normal.     Left Ear: External ear normal.     Nose: Nose normal.  Eyes:     General:        Right eye: No discharge.        Left eye: No discharge.     Conjunctiva/sclera: Conjunctivae normal.     Pupils: Pupils are equal, round, and reactive to light.  Neck:     Thyroid: No thyromegaly.     Vascular: No JVD.     Trachea: No tracheal deviation.  Cardiovascular:     Rate and Rhythm: Normal rate and regular rhythm.     Heart sounds: Normal heart sounds.  Pulmonary:     Effort: No respiratory distress.     Breath sounds: No stridor. No wheezing.  Abdominal:     General: Bowel sounds are normal. There is no distension.     Palpations: Abdomen is soft. There is no mass.     Tenderness: There is no abdominal tenderness. There is no guarding or rebound.  Musculoskeletal:        General: No tenderness.     Cervical back: Normal range of motion and neck supple. No rigidity.  Lymphadenopathy:     Cervical: No cervical adenopathy.  Skin:    Findings: No erythema or rash.  Neurological:     Mental Status: She is oriented to person, place, and time.     Cranial Nerves: No cranial nerve deficit.     Motor: No abnormal muscle tone.     Coordination: Coordination abnormal.     Gait: Gait abnormal.     Deep Tendon Reflexes: Reflexes normal.   Talkative, pleasantly confused No pronator drift Pupils reactive Horizontal nystagmus  Lab Results  Component Value Date   WBC 6.8 01/12/2022   HGB 9.9 (L) 01/12/2022   HCT 32.2 (L) 01/12/2022   PLT 491.0 (H) 01/12/2022   GLUCOSE 85 03/09/2022   CHOL 217 (H) 09/02/2021   TRIG 204.0 (H) 09/02/2021   HDL 52.80 09/02/2021   LDLDIRECT 120.0 09/02/2021   LDLCALC 85 07/16/2020   ALT 104 (H) 03/09/2022   AST 85 (H) 03/09/2022   NA 141 03/09/2022   K 4.4 03/09/2022   CL 102 03/09/2022   CREATININE 1.15 03/09/2022   BUN 20 03/09/2022   CO2 32  03/09/2022   TSH 3.09 09/02/2021   HGBA1C 5.7 01/12/2022    US BREAST LTD UNI RIGHT INC AXILLA  Result Date:  07/09/2020 CLINICAL DATA:  55 year old female presenting for evaluation of 2 palpable lumps in the right breast at 7 o'clock. EXAM: DIGITAL DIAGNOSTIC BILATERAL MAMMOGRAM WITH TOMO AND CAD; ULTRASOUND RIGHT BREAST LIMITED COMPARISON:  Previous exam(s). ACR Breast Density Category c: The breast tissue is heterogeneously dense, which may obscure small masses. FINDINGS: A BB has been placed at the palpable site of concern on the lower-inner quadrant of the right breast. No suspicious mammographic findings are identified deep to the palpable marker. The patient does have an peripherally calcified catheter fragment from a VP shunt more medial to the marker. No other suspicious calcifications, masses or areas of distortion are seen in the bilateral breasts. Mammographic images were processed with CAD. Ultrasound targeted to the palpable site in the right breast at 4 o'clock, 7 cm from the nipple demonstrates a tubular structure with peripheral calcifications compatible with the patient seen mammographically. No other masses or suspicious areas of shadowing are identified. IMPRESSION: 1. The palpable site in the medial right breast appears to correspond with the patient's VP shunt catheter fragment. No other mammographic or targeted sonographic abnormalities are identified. 2. No suspicious calcifications, masses or areas of distortion are seen in the bilateral breasts. RECOMMENDATION: Screening mammogram in one year.(Code:SM-B-01Y) I have discussed the findings and recommendations with the patient. If applicable, a reminder letter will be sent to the patient regarding the next appointment. BI-RADS CATEGORY  1: Negative. Electronically Signed   By: Frederico Hamman M.D.   On: 07/09/2020 15:23   MM DIAG BREAST TOMO BILATERAL  Result Date: 07/09/2020 CLINICAL DATA:  56 year old female presenting for  evaluation of 2 palpable lumps in the right breast at 7 o'clock. EXAM: DIGITAL DIAGNOSTIC BILATERAL MAMMOGRAM WITH TOMO AND CAD; ULTRASOUND RIGHT BREAST LIMITED COMPARISON:  Previous exam(s). ACR Breast Density Category c: The breast tissue is heterogeneously dense, which may obscure small masses. FINDINGS: A BB has been placed at the palpable site of concern on the lower-inner quadrant of the right breast. No suspicious mammographic findings are identified deep to the palpable marker. The patient does have an peripherally calcified catheter fragment from a VP shunt more medial to the marker. No other suspicious calcifications, masses or areas of distortion are seen in the bilateral breasts. Mammographic images were processed with CAD. Ultrasound targeted to the palpable site in the right breast at 4 o'clock, 7 cm from the nipple demonstrates a tubular structure with peripheral calcifications compatible with the patient seen mammographically. No other masses or suspicious areas of shadowing are identified. IMPRESSION: 1. The palpable site in the medial right breast appears to correspond with the patient's VP shunt catheter fragment. No other mammographic or targeted sonographic abnormalities are identified. 2. No suspicious calcifications, masses or areas of distortion are seen in the bilateral breasts. RECOMMENDATION: Screening mammogram in one year.(Code:SM-B-01Y) I have discussed the findings and recommendations with the patient. If applicable, a reminder letter will be sent to the patient regarding the next appointment. BI-RADS CATEGORY  1: Negative. Electronically Signed   By: Frederico Hamman M.D.   On: 07/09/2020 15:23    Assessment & Plan:   Problem List Items Addressed This Visit     Acute confusion    Likely due to Delta 8  triggered ncephalopathy Stop to take CBD with Delta 8 Check CBC, UA, CMET [name] will supervise [name] at home Go to ER if not better      Relevant Orders    Comprehensive metabolic panel   CBC with Differential/Platelet  Urinalysis   Low back pain radiating to left lower extremity    Chronic pain management was reviewed          No orders of the defined types were placed in this encounter.     Follow-up: No follow-ups on file.  Sonda Primes, MD

## 2022-04-15 NOTE — Patient Instructions (Addendum)
Stop to take CBD with Delta 8 Go to ER if not better

## 2022-04-15 NOTE — Progress Notes (Signed)
abx

## 2022-04-15 NOTE — Assessment & Plan Note (Signed)
Abn UA Start Levaquin

## 2022-04-15 NOTE — Assessment & Plan Note (Addendum)
Likely due to Delta 8  triggered ncephalopathy Stop to take CBD with Delta 8 Check CBC, UA, CMET [name] will supervise [name] at home Go to ER if not better

## 2022-04-15 NOTE — Assessment & Plan Note (Signed)
Chronic pain management was reviewed

## 2022-04-18 ENCOUNTER — Institutional Professional Consult (permissible substitution): Payer: Medicare HMO | Admitting: Pulmonary Disease

## 2022-04-19 ENCOUNTER — Ambulatory Visit: Payer: Medicare HMO | Admitting: Internal Medicine

## 2022-04-27 ENCOUNTER — Institutional Professional Consult (permissible substitution): Payer: Medicare HMO | Admitting: Pulmonary Disease

## 2022-04-28 ENCOUNTER — Encounter: Payer: Self-pay | Admitting: Internal Medicine

## 2022-04-28 ENCOUNTER — Telehealth: Payer: Self-pay | Admitting: Internal Medicine

## 2022-04-28 ENCOUNTER — Ambulatory Visit (INDEPENDENT_AMBULATORY_CARE_PROVIDER_SITE_OTHER): Payer: Medicare HMO | Admitting: Internal Medicine

## 2022-04-28 DIAGNOSIS — M545 Low back pain, unspecified: Secondary | ICD-10-CM

## 2022-04-28 DIAGNOSIS — G8929 Other chronic pain: Secondary | ICD-10-CM

## 2022-04-28 DIAGNOSIS — M79605 Pain in left leg: Secondary | ICD-10-CM

## 2022-04-28 DIAGNOSIS — R110 Nausea: Secondary | ICD-10-CM

## 2022-04-28 DIAGNOSIS — M542 Cervicalgia: Secondary | ICD-10-CM | POA: Diagnosis not present

## 2022-04-28 MED ORDER — MORPHINE SULFATE ER 30 MG PO TBCR: 30.0000 mg | EXTENDED_RELEASE_TABLET | Freq: Two times a day (BID) | ORAL | 0 refills | Status: DC

## 2022-04-28 MED ORDER — NALOXONE HCL 4 MG/0.1ML NA LIQD: 1.0000 | Freq: Once | NASAL | 3 refills | Status: AC

## 2022-04-28 MED ORDER — OXYCODONE HCL 10 MG PO TABS: 10.0000 mg | ORAL_TABLET | Freq: Three times a day (TID) | ORAL | 0 refills | Status: DC | PRN

## 2022-04-28 MED ORDER — PROMETHAZINE HCL 12.5 MG PO TABS
12.5000 mg | ORAL_TABLET | Freq: Four times a day (QID) | ORAL | 0 refills | Status: AC | PRN
Start: 2022-04-28 — End: ?
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
## 2022-04-28 NOTE — Assessment & Plan Note (Signed)
?  partial opioid  withdrawal - Promethazine prn
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
## 2022-04-29 ENCOUNTER — Telehealth: Payer: Self-pay | Admitting: Internal Medicine
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
## 2022-04-29 NOTE — Telephone Encounter (Signed)
Note not needed 
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
## 2022-05-01 ENCOUNTER — Encounter (HOSPITAL_BASED_OUTPATIENT_CLINIC_OR_DEPARTMENT_OTHER): Payer: Self-pay | Admitting: Emergency Medicine

## 2022-05-01 ENCOUNTER — Emergency Department (HOSPITAL_BASED_OUTPATIENT_CLINIC_OR_DEPARTMENT_OTHER): Payer: Medicare HMO

## 2022-05-01 ENCOUNTER — Inpatient Hospital Stay (HOSPITAL_BASED_OUTPATIENT_CLINIC_OR_DEPARTMENT_OTHER)
Admission: EM | Admit: 2022-05-01 | Discharge: 2022-05-03 | DRG: 380 | Disposition: A | Discharge status: Home / Self Care | Payer: Medicare HMO | Attending: Internal Medicine | Admitting: Internal Medicine

## 2022-05-01 ENCOUNTER — Other Ambulatory Visit: Payer: Self-pay

## 2022-05-01 DIAGNOSIS — Z820 Family history of epilepsy and other diseases of the nervous system: Secondary | ICD-10-CM | POA: Diagnosis not present

## 2022-05-01 DIAGNOSIS — S199XXA Unspecified injury of neck, initial encounter: Secondary | ICD-10-CM | POA: Diagnosis not present

## 2022-05-01 DIAGNOSIS — G9341 Metabolic encephalopathy: Secondary | ICD-10-CM | POA: Diagnosis present

## 2022-05-01 DIAGNOSIS — F32A Depression, unspecified: Secondary | ICD-10-CM | POA: Diagnosis present

## 2022-05-01 DIAGNOSIS — Z8673 Personal history of transient ischemic attack (TIA), and cerebral infarction without residual deficits: Secondary | ICD-10-CM | POA: Diagnosis not present

## 2022-05-01 DIAGNOSIS — K922 Gastrointestinal hemorrhage, unspecified: Secondary | ICD-10-CM | POA: Diagnosis present

## 2022-05-01 DIAGNOSIS — F4323 Adjustment disorder with mixed anxiety and depressed mood: Secondary | ICD-10-CM | POA: Diagnosis present

## 2022-05-01 DIAGNOSIS — D649 Anemia, unspecified: Secondary | ICD-10-CM | POA: Diagnosis present

## 2022-05-01 DIAGNOSIS — Z7982 Long term (current) use of aspirin: Secondary | ICD-10-CM

## 2022-05-01 DIAGNOSIS — K254 Chronic or unspecified gastric ulcer with hemorrhage: Secondary | ICD-10-CM | POA: Diagnosis present

## 2022-05-01 DIAGNOSIS — K2210 Ulcer of esophagus without bleeding: Secondary | ICD-10-CM

## 2022-05-01 DIAGNOSIS — E785 Hyperlipidemia, unspecified: Secondary | ICD-10-CM | POA: Diagnosis present

## 2022-05-01 DIAGNOSIS — F03A0 Unspecified dementia, mild, without behavioral disturbance, psychotic disturbance, mood disturbance, and anxiety: Secondary | ICD-10-CM | POA: Diagnosis present

## 2022-05-01 DIAGNOSIS — Y929 Unspecified place or not applicable: Secondary | ICD-10-CM | POA: Diagnosis not present

## 2022-05-01 DIAGNOSIS — W19XXXA Unspecified fall, initial encounter: Secondary | ICD-10-CM | POA: Diagnosis present

## 2022-05-01 DIAGNOSIS — G8929 Other chronic pain: Secondary | ICD-10-CM | POA: Diagnosis not present

## 2022-05-01 DIAGNOSIS — K5909 Other constipation: Secondary | ICD-10-CM | POA: Diagnosis present

## 2022-05-01 DIAGNOSIS — K2211 Ulcer of esophagus with bleeding: Secondary | ICD-10-CM | POA: Diagnosis present

## 2022-05-01 DIAGNOSIS — G9340 Encephalopathy, unspecified: Secondary | ICD-10-CM | POA: Diagnosis not present

## 2022-05-01 DIAGNOSIS — N179 Acute kidney failure, unspecified: Secondary | ICD-10-CM | POA: Diagnosis present

## 2022-05-01 DIAGNOSIS — N183 Chronic kidney disease, stage 3 unspecified: Secondary | ICD-10-CM | POA: Diagnosis present

## 2022-05-01 DIAGNOSIS — K449 Diaphragmatic hernia without obstruction or gangrene: Secondary | ICD-10-CM | POA: Diagnosis not present

## 2022-05-01 DIAGNOSIS — Z888 Allergy status to other drugs, medicaments and biological substances status: Secondary | ICD-10-CM | POA: Diagnosis not present

## 2022-05-01 DIAGNOSIS — Z833 Family history of diabetes mellitus: Secondary | ICD-10-CM

## 2022-05-01 DIAGNOSIS — R195 Other fecal abnormalities: Secondary | ICD-10-CM | POA: Diagnosis not present

## 2022-05-01 DIAGNOSIS — R413 Other amnesia: Secondary | ICD-10-CM | POA: Diagnosis present

## 2022-05-01 DIAGNOSIS — G894 Chronic pain syndrome: Secondary | ICD-10-CM | POA: Diagnosis present

## 2022-05-01 DIAGNOSIS — K219 Gastro-esophageal reflux disease without esophagitis: Secondary | ICD-10-CM | POA: Diagnosis present

## 2022-05-01 DIAGNOSIS — I10 Essential (primary) hypertension: Secondary | ICD-10-CM | POA: Diagnosis present

## 2022-05-01 DIAGNOSIS — K253 Acute gastric ulcer without hemorrhage or perforation: Secondary | ICD-10-CM

## 2022-05-01 DIAGNOSIS — F419 Anxiety disorder, unspecified: Secondary | ICD-10-CM | POA: Diagnosis present

## 2022-05-01 DIAGNOSIS — K250 Acute gastric ulcer with hemorrhage: Secondary | ICD-10-CM | POA: Diagnosis present

## 2022-05-01 DIAGNOSIS — Z8249 Family history of ischemic heart disease and other diseases of the circulatory system: Secondary | ICD-10-CM

## 2022-05-01 DIAGNOSIS — Z9989 Dependence on other enabling machines and devices: Secondary | ICD-10-CM

## 2022-05-01 DIAGNOSIS — Z79899 Other long term (current) drug therapy: Secondary | ICD-10-CM

## 2022-05-01 DIAGNOSIS — K3189 Other diseases of stomach and duodenum: Secondary | ICD-10-CM | POA: Diagnosis not present

## 2022-05-01 DIAGNOSIS — N1831 Chronic kidney disease, stage 3a: Secondary | ICD-10-CM | POA: Diagnosis present

## 2022-05-01 DIAGNOSIS — N1832 Chronic kidney disease, stage 3b: Secondary | ICD-10-CM | POA: Diagnosis present

## 2022-05-01 DIAGNOSIS — Z79891 Long term (current) use of opiate analgesic: Secondary | ICD-10-CM | POA: Diagnosis not present

## 2022-05-01 DIAGNOSIS — R4182 Altered mental status, unspecified: Secondary | ICD-10-CM | POA: Diagnosis not present

## 2022-05-01 DIAGNOSIS — Z982 Presence of cerebrospinal fluid drainage device: Secondary | ICD-10-CM

## 2022-05-01 DIAGNOSIS — R9431 Abnormal electrocardiogram [ECG] [EKG]: Secondary | ICD-10-CM | POA: Diagnosis present

## 2022-05-01 DIAGNOSIS — I129 Hypertensive chronic kidney disease with stage 1 through stage 4 chronic kidney disease, or unspecified chronic kidney disease: Secondary | ICD-10-CM | POA: Diagnosis present

## 2022-05-01 DIAGNOSIS — G4733 Obstructive sleep apnea (adult) (pediatric): Secondary | ICD-10-CM | POA: Diagnosis present

## 2022-05-01 DIAGNOSIS — K259 Gastric ulcer, unspecified as acute or chronic, without hemorrhage or perforation: Secondary | ICD-10-CM | POA: Diagnosis not present

## 2022-05-01 LAB — COMPREHENSIVE METABOLIC PANEL
ALT: 15 U/L (ref 0–44)
AST: 17 U/L (ref 15–41)
Albumin: 3.2 g/dL — ABNORMAL LOW (ref 3.5–5.0)
Alkaline Phosphatase: 44 U/L (ref 38–126)
Anion gap: 7 (ref 5–15)
BUN: 53 mg/dL — ABNORMAL HIGH (ref 6–20)
CO2: 19 mmol/L — ABNORMAL LOW (ref 22–32)
Calcium: 8.5 mg/dL — ABNORMAL LOW (ref 8.9–10.3)
Chloride: 106 mmol/L (ref 98–111)
Creatinine, Ser: 2.03 mg/dL — ABNORMAL HIGH (ref 0.44–1.00)
GFR, Estimated: 28 mL/min — ABNORMAL LOW (ref >60)
Glucose, Bld: 99 mg/dL (ref 70–99)
Potassium: 4.1 mmol/L (ref 3.5–5.1)
Sodium: 132 mmol/L — ABNORMAL LOW (ref 135–145)
Total Bilirubin: 0.5 mg/dL (ref 0.3–1.2)
Total Protein: 5.7 g/dL — ABNORMAL LOW (ref 6.5–8.1)

## 2022-05-01 LAB — CBC WITH DIFFERENTIAL/PLATELET
Abs Immature Granulocytes: 0.06 10*3/uL (ref 0.00–0.07)
Basophils Absolute: 0 10*3/uL (ref 0.0–0.1)
Basophils Relative: 0 %
Eosinophils Absolute: 0.4 10*3/uL (ref 0.0–0.5)
Eosinophils Relative: 4 %
HCT: 21.4 % — ABNORMAL LOW (ref 36.0–46.0)
Hemoglobin: 6.8 g/dL — CL (ref 12.0–15.0)
Immature Granulocytes: 1 %
Lymphocytes Relative: 32 %
Lymphs Abs: 3.1 10*3/uL (ref 0.7–4.0)
MCH: 30.8 pg (ref 26.0–34.0)
MCHC: 31.8 g/dL (ref 30.0–36.0)
MCV: 96.8 fL (ref 80.0–100.0)
Monocytes Absolute: 0.8 10*3/uL (ref 0.1–1.0)
Monocytes Relative: 8 %
Neutro Abs: 5.3 10*3/uL (ref 1.7–7.7)
Neutrophils Relative %: 55 %
Platelets: 458 10*3/uL — ABNORMAL HIGH (ref 150–400)
RBC: 2.21 MIL/uL — ABNORMAL LOW (ref 3.87–5.11)
RDW: 14.9 % (ref 11.5–15.5)
WBC: 9.6 10*3/uL (ref 4.0–10.5)
nRBC: 0 % (ref 0.0–0.2)

## 2022-05-01 LAB — URINALYSIS, ROUTINE W REFLEX MICROSCOPIC
Bilirubin Urine: NEGATIVE
Glucose, UA: NEGATIVE mg/dL
Hgb urine dipstick: NEGATIVE
Ketones, ur: NEGATIVE mg/dL
Leukocytes,Ua: NEGATIVE
Nitrite: NEGATIVE
Protein, ur: NEGATIVE mg/dL
Specific Gravity, Urine: 1.015 (ref 1.005–1.030)
pH: 5 (ref 5.0–8.0)

## 2022-05-01 LAB — AMMONIA: Ammonia: 11 umol/L (ref 9–35)

## 2022-05-01 LAB — OCCULT BLOOD X 1 CARD TO LAB, STOOL: Fecal Occult Bld: POSITIVE — AB

## 2022-05-01 MED ORDER — ACETAMINOPHEN 325 MG PO TABS
650.0000 mg | ORAL_TABLET | Freq: Once | ORAL | Status: AC
  Administered 2022-05-01: 650 mg via ORAL
  Filled 2022-05-01: qty 2

## 2022-05-01 MED ORDER — PANTOPRAZOLE SODIUM 40 MG IV SOLR: 40.0000 mg | Freq: Two times a day (BID) | INTRAVENOUS | Status: DC

## 2022-05-01 MED ORDER — PANTOPRAZOLE 80MG IVPB - SIMPLE MED
80.0000 mg | Freq: Once | INTRAVENOUS | Status: AC
  Administered 2022-05-02: 80 mg via INTRAVENOUS
  Filled 2022-05-01: qty 100

## 2022-05-01 MED ORDER — PANTOPRAZOLE INFUSION (NEW) - SIMPLE MED
8.0000 mg/h | INTRAVENOUS | Status: DC
  Administered 2022-05-02 (×2): 8 mg/h via INTRAVENOUS
  Filled 2022-05-01: qty 100
  Filled 2022-05-01: qty 80
  Filled 2022-05-01: qty 100

## 2022-05-01 NOTE — ED Notes (Signed)
Patient transported to CT 

## 2022-05-01 NOTE — ED Notes (Signed)
Patient ambulated to restroom with visitor assisting. Patient ambulated with a cane and steady gait.

## 2022-05-01 NOTE — ED Provider Notes (Addendum)
[facility] Provider Note   CSN: [id number] Arrival date & time: 05/01/22  2025     History  Chief Complaint  Patient presents with   Altered Mental Status    Samantha [name] is a 56 y.o. female.  Patient here with concern for urinary tract infection.  Patient fell today hit her head.  She is having some head pain and neck pain.  History of chronic pain.  Nothing makes it worse or better.  Denies loss of consciousness.  Not on blood thinners.  No other extremity pain.  Has had some confusion last 24 hours but has a history of dementia, anxiety.  No abdominal pain, chest pain or shortness of breath.  This was unwitnessed fall that she had.  Sounds like maybe she got a little lightheaded and woozy and fell.  Not sure if she lost consciousness.  She has been on iron pills recently.  The history is provided by the patient and a caregiver.       Home Medications Prior to Admission medications   Medication Sig Start Date End Date Taking? Authorizing Provider  aspirin 81 MG tablet Take 81 mg by mouth daily.    [provider]  atorvastatin (LIPITOR) 20 MG tablet TAKE 1 TABLET EVERY DAY AT 6 PM 06/17/21   Plotnikov, Evie Lacks, MD  busPIRone (BUSPAR) 10 MG tablet TAKE 1 TABLET THREE TIMES DAILY 02/23/22   Plotnikov, Evie Lacks, MD  Calcium Carbonate-Vitamin D (CALCIUM-VITAMIN D) 500-200 MG-UNIT per tablet Take 1 tablet by mouth 2 (two) times daily with a meal.    [provider]  Docusate Calcium (STOOL SOFTENER PO) Take by mouth 2 (two) times daily.    [provider]  donepezil (ARICEPT) 10 MG tablet TAKE 1 TABLET AT BEDTIME 03/31/22   Plotnikov, Evie Lacks, MD  DULoxetine (CYMBALTA) 60 MG capsule TAKE 1 CAPSULE EVERY DAY 12/23/21   Plotnikov, Evie Lacks, MD  famotidine (PEPCID) 40 MG tablet TAKE 1 TABLET EVERY DAY 12/23/21   Plotnikov, Evie Lacks, MD  ferrous sulfate 325 (65 FE) MG tablet Take 1 tablet (325 mg total) by mouth daily. 01/16/22  01/16/23  Plotnikov, Evie Lacks, MD  gabapentin (NEURONTIN) 300 MG capsule Take 300 mg by mouth 3 (three) times daily.    [provider]  lactulose (CHRONULAC) 10 GM/15ML solution Take 30-45 mLs (20-30 g total) by mouth 2 (two) times daily as needed for moderate constipation or severe constipation. 07/18/18   Plotnikov, Evie Lacks, MD  lamoTRIgine (LAMICTAL) 100 MG tablet TAKE 1 TABLET EVERY MORNING, AND 1 AND 1/2 TABLETS AT NIGHT. 01/14/22   Plotnikov, Evie Lacks, MD  levofloxacin (LEVAQUIN) 500 MG tablet Take 1 tablet (500 mg total) by mouth daily. 04/15/22   Plotnikov, Evie Lacks, MD  lidocaine (LIDODERM) 5 % Place 1 patch onto the skin daily. Remove & Discard patch within 12 hours or as directed by MD 01/14/19   Plotnikov, Evie Lacks, MD  LINZESS 290 MCG CAPS capsule TAKE 1 CAPSULE  DAILY BEFORE BREAKFAST 02/02/22   Plotnikov, Evie Lacks, MD  meloxicam (MOBIC) 15 MG tablet TAKE 1/2 TO 1 TABLET BY MOUTH DAILY AS NEEDED FOR PAIN 12/27/21   Plotnikov, Evie Lacks, MD  memantine (NAMENDA) 5 MG tablet Take 1 tablet (5 mg total) by mouth 2 (two) times daily. 09/02/21   Plotnikov, Evie Lacks, MD  morphine (MS CONTIN) 30 MG 12 hr tablet Take 1 tablet (30 mg total) by mouth every 12 (twelve) hours.  04/28/22   Plotnikov, Georgina Quint, MD  olmesartan (BENICAR) 20 MG tablet Take 1 tablet (20 mg total) by mouth daily. 03/09/22 03/09/23  Plotnikov, Georgina Quint, MD  Oxycodone HCl 10 MG TABS Take 1 tablet (10 mg total) by mouth 3 (three) times daily as needed. 04/28/22   Plotnikov, Georgina Quint, MD  Probiotic Product (ALIGN) 4 MG CAPS Take 1 capsule (4 mg total) by mouth daily. 01/23/20   Plotnikov, Georgina Quint, MD  promethazine (PHENERGAN) 12.5 MG tablet Take 1 tablet (12.5 mg total) by mouth every 6 (six) hours as needed for nausea or vomiting. 04/28/22   Plotnikov, Georgina Quint, MD      Allergies    Alprazolam and Citalopram hydrobromide    Review of Systems   Review of Systems  Physical Exam Updated Vital Signs BP 102/76   Pulse  81   Temp 98.2 F (36.8 C) (Oral)   Resp 18   Ht 4\' 10"  (1.473 m)   Wt 68.5 kg   SpO2 100%   BMI 31.56 kg/m  Physical Exam Vitals and nursing note reviewed.  Constitutional:      General: She is not in acute distress.    Appearance: She is well-developed.  HENT:     Head: Normocephalic and atraumatic.  Eyes:     Extraocular Movements: Extraocular movements intact.     Conjunctiva/sclera: Conjunctivae normal.     Pupils: Pupils are equal, round, and reactive to light.  Cardiovascular:     Rate and Rhythm: Normal rate and regular rhythm.     Heart sounds: No murmur heard. Pulmonary:     Effort: Pulmonary effort is normal. No respiratory distress.     Breath sounds: Normal breath sounds.  Abdominal:     Palpations: Abdomen is soft.     Tenderness: There is no abdominal tenderness.  Musculoskeletal:        General: Tenderness present. No swelling. Normal range of motion.     Cervical back: Normal range of motion and neck supple. Tenderness present.  Skin:    General: Skin is warm and dry.     Capillary Refill: Capillary refill takes less than 2 seconds.  Neurological:     General: No focal deficit present.     Mental Status: She is alert and oriented to person, place, and time.     Cranial Nerves: No cranial nerve deficit.     Sensory: No sensory deficit.     Motor: No weakness.     Coordination: Coordination normal.  Psychiatric:        Mood and Affect: Mood normal.     ED Results / Procedures / Treatments   Labs (all labs ordered are listed, but only abnormal results are displayed) Labs Reviewed  CBC WITH DIFFERENTIAL/PLATELET - Abnormal; Notable for the following components:      Result Value   RBC 2.21 (*)    Hemoglobin 6.8 (*)    HCT 21.4 (*)    Platelets 458 (*)    All other components within normal limits  COMPREHENSIVE METABOLIC PANEL - Abnormal; Notable for the following components:   Sodium 132 (*)    CO2 19 (*)    BUN 53 (*)    Creatinine, Ser 2.03  (*)    Calcium 8.5 (*)    Total Protein 5.7 (*)    Albumin 3.2 (*)    GFR, Estimated 28 (*)    All other components within normal limits  URINALYSIS, ROUTINE W REFLEX MICROSCOPIC  AMMONIA  VITAMIN B12  FOLATE  IRON AND TIBC  FERRITIN  RETICULOCYTES  POC OCCULT BLOOD, ED    EKG None  Radiology CT Head Wo Contrast  Result Date: 05/01/2022 CLINICAL DATA:  Mental status change, unknown cause EXAM: CT HEAD WITHOUT CONTRAST TECHNIQUE: Contiguous axial images were obtained from the base of the skull through the vertex without intravenous contrast. RADIATION DOSE REDUCTION: This exam was performed according to the departmental dose-optimization program which includes automated exposure control, adjustment of the mA and/or kV according to patient size and/or use of iterative reconstruction technique. COMPARISON:  CT head 10/21/2015 BRAIN: BRAIN Similar-appearing right posterior porch ventriculoperitoneal shunt with tip again noted to terminate within the inferior horn of the right lateral ventricle. Right frontotemporal encephalomalacia. No evidence of large-territorial acute infarction. No parenchymal hemorrhage. No mass lesion. No extra-axial collection. No mass effect or midline shift. Similar appearance of the lateral ventricles and third ventricle. Basilar cisterns are patent. Vascular: No hyperdense vessel. Vascular clipping again noted at the central skull base. Skull: No acute fracture or focal lesion.  Right frontal craniotomy. Sinuses/Orbits: Paranasal sinuses and mastoid air cells are clear. The orbits are unremarkable. Other: None. IMPRESSION: No acute intracranial abnormality in a patient with known right posterior approach ventriculoperitoneal shunt. Stable size of lateral and third ventricles compared to 2016. Electronically Signed   By: Iven Finn M.D.   On: 05/01/2022 21:33   DG Chest Portable 1 View  Result Date: 05/01/2022 CLINICAL DATA:  Altered mental status EXAM: PORTABLE  CHEST 1 VIEW COMPARISON:  08/29/2018 FINDINGS: Mild cardiomegaly. Small hiatal hernia. No focal airspace consolidation, pleural effusion, or pneumothorax. Right-sided VP shunt catheter with similar degree of irregularity of the catheter contours within the chest. Possible catheter discontinuity the level of the right sixth rib. IMPRESSION: 1. No acute cardiopulmonary disease. 2. Small hiatal hernia. 3. Possible VP shunt catheter discontinuity within the mid chest. Electronically Signed   By: Davina Poke D.O.   On: 05/01/2022 21:22    Procedures .Critical Care  Performed by: Lennice Sites, DO Authorized by: Lennice Sites, DO   Critical care provider statement:    Critical care time (minutes):  40   Critical care was necessary to treat or prevent imminent or life-threatening deterioration of the following conditions:  Circulatory failure   Critical care was time spent personally by me on the following activities:  Blood draw for specimens, development of treatment plan with patient or surrogate, discussions with consultants, discussions with primary provider, evaluation of patient's response to treatment, examination of patient, obtaining history from patient or surrogate, ordering and performing treatments and interventions, ordering and review of laboratory studies, ordering and review of radiographic studies, pulse oximetry, re-evaluation of patient's condition and review of old charts   Care discussed with: admitting provider       Medications Ordered in ED Medications  pantoprazole (PROTONIX) 80 mg /NS 100 mL IVPB (has no administration in time range)  pantoprozole (PROTONIX) 80 mg /NS 100 mL infusion (has no administration in time range)  pantoprazole (PROTONIX) injection 40 mg (has no administration in time range)  acetaminophen (TYLENOL) tablet 650 mg (650 mg Oral Given 05/01/22 2147)    ED Course/ Medical Decision Making/ A&P                           Medical Decision  Making Amount and/or Complexity of Data Reviewed Labs: ordered. Radiology: ordered.  Risk OTC drugs. Prescription drug management. Decision  regarding hospitalization.   LYNIX BONINE is here with concern for UTI, fall, neck pain.  Patient with history of stroke, VP shunt, dementia.  Sounds like maybe she got briefly lightheaded not sure if she tripped and fell.  She does not think she lost consciousness.  Thinks she hit her head.  She not on blood thinners.  She is felt a little confused and weak recently.  Denies any black or bloody stools.  But she has been on iron.  Normal vitals.  No fever.  No concern for urine infection because patient mildly confused but overall she appears well.  Neurologically she is intact.  No midline spinal pain.  No extremity pain.  She has chronic pain overall and chronic back pain and neck pain.  We will look for infection with chest x-ray and urine study.  We will check CBC and CMP.  We will get a head CT and neck CT.  Differential is likely infectious process versus traumatic process.  Have no concern for stroke or other acute neurologic or cardiac process.  She has been maybe more fatigued recently.  Hemoglobin is 6.8.  But otherwise lab work is unremarkable.  Urinalysis negative for infection.  CT scan of the head and neck with no traumatic findings.  Chest x-ray unremarkable.  Concern for may be some VP shunt disconnection but this is chronic appearing per radiology who I talked on the phone.  She has no findings of any acute changes on head CT.  I have no concern for any acute process with her head.  Overall suspect that she likely fell from symptomatic anemia.  No history of GI bleed.  No history of alcohol use.  Does use NSAIDs every once a while.  We will send off anemia panel.  We will admit her to medicine.  Stool does look dark.  She is on iron.  Will start IV Protonix.  I have message Dr. Lavon Paganini with [facility].  Will admit to medicine.  Concern for  possible GI bleed.  This chart was dictated using voice recognition software.  Despite best efforts to proofread,  errors can occur which can change the documentation meaning.    Final Clinical Impression(s) / ED Diagnoses Final diagnoses:  Symptomatic anemia  Gastrointestinal hemorrhage, unspecified gastrointestinal hemorrhage type    Rx / DC Orders ED Discharge Orders     None         Virgina Norfolk, DO 05/01/22 2218    Virgina Norfolk, DO 05/01/22 2221

## 2022-05-01 NOTE — ED Triage Notes (Addendum)
Pt "went out of her head" since yesterday; pt's friend sts this happened 2 wks ago and ws dx and treated for UTI; also slipped on camper step today, hitting mid back and posterior head; no thinners, no LOC

## 2022-05-02 ENCOUNTER — Encounter (HOSPITAL_COMMUNITY)
Admission: EM | Disposition: A | Discharge status: Home / Self Care | Payer: Self-pay | Source: Home / Self Care | Attending: Internal Medicine

## 2022-05-02 ENCOUNTER — Other Ambulatory Visit: Payer: Self-pay

## 2022-05-02 ENCOUNTER — Observation Stay (HOSPITAL_COMMUNITY): Payer: Medicare HMO | Admitting: Certified Registered Nurse Anesthetist

## 2022-05-02 ENCOUNTER — Encounter (HOSPITAL_COMMUNITY): Payer: Self-pay | Admitting: Family Medicine

## 2022-05-02 ENCOUNTER — Observation Stay (HOSPITAL_BASED_OUTPATIENT_CLINIC_OR_DEPARTMENT_OTHER): Payer: Medicare HMO | Admitting: Certified Registered Nurse Anesthetist

## 2022-05-02 DIAGNOSIS — G9340 Encephalopathy, unspecified: Secondary | ICD-10-CM | POA: Diagnosis not present

## 2022-05-02 DIAGNOSIS — K2210 Ulcer of esophagus without bleeding: Secondary | ICD-10-CM | POA: Diagnosis not present

## 2022-05-02 DIAGNOSIS — E785 Hyperlipidemia, unspecified: Secondary | ICD-10-CM | POA: Diagnosis present

## 2022-05-02 DIAGNOSIS — G8929 Other chronic pain: Secondary | ICD-10-CM | POA: Diagnosis not present

## 2022-05-02 DIAGNOSIS — K259 Gastric ulcer, unspecified as acute or chronic, without hemorrhage or perforation: Secondary | ICD-10-CM | POA: Diagnosis not present

## 2022-05-02 DIAGNOSIS — K5909 Other constipation: Secondary | ICD-10-CM | POA: Diagnosis present

## 2022-05-02 DIAGNOSIS — R195 Other fecal abnormalities: Secondary | ICD-10-CM | POA: Diagnosis not present

## 2022-05-02 DIAGNOSIS — K250 Acute gastric ulcer with hemorrhage: Secondary | ICD-10-CM | POA: Diagnosis present

## 2022-05-02 DIAGNOSIS — R9431 Abnormal electrocardiogram [ECG] [EKG]: Secondary | ICD-10-CM | POA: Diagnosis present

## 2022-05-02 DIAGNOSIS — K449 Diaphragmatic hernia without obstruction or gangrene: Secondary | ICD-10-CM | POA: Diagnosis not present

## 2022-05-02 DIAGNOSIS — Z888 Allergy status to other drugs, medicaments and biological substances status: Secondary | ICD-10-CM | POA: Diagnosis not present

## 2022-05-02 DIAGNOSIS — F32A Depression, unspecified: Secondary | ICD-10-CM | POA: Diagnosis present

## 2022-05-02 DIAGNOSIS — Z7982 Long term (current) use of aspirin: Secondary | ICD-10-CM | POA: Diagnosis not present

## 2022-05-02 DIAGNOSIS — Z820 Family history of epilepsy and other diseases of the nervous system: Secondary | ICD-10-CM | POA: Diagnosis not present

## 2022-05-02 DIAGNOSIS — R413 Other amnesia: Secondary | ICD-10-CM

## 2022-05-02 DIAGNOSIS — K3189 Other diseases of stomach and duodenum: Secondary | ICD-10-CM | POA: Diagnosis not present

## 2022-05-02 DIAGNOSIS — Z833 Family history of diabetes mellitus: Secondary | ICD-10-CM | POA: Diagnosis not present

## 2022-05-02 DIAGNOSIS — N1832 Chronic kidney disease, stage 3b: Secondary | ICD-10-CM | POA: Diagnosis present

## 2022-05-02 DIAGNOSIS — N179 Acute kidney failure, unspecified: Secondary | ICD-10-CM | POA: Diagnosis present

## 2022-05-02 DIAGNOSIS — Z8673 Personal history of transient ischemic attack (TIA), and cerebral infarction without residual deficits: Secondary | ICD-10-CM | POA: Diagnosis not present

## 2022-05-02 DIAGNOSIS — N1831 Chronic kidney disease, stage 3a: Secondary | ICD-10-CM | POA: Diagnosis present

## 2022-05-02 DIAGNOSIS — K254 Chronic or unspecified gastric ulcer with hemorrhage: Secondary | ICD-10-CM | POA: Diagnosis present

## 2022-05-02 DIAGNOSIS — W19XXXA Unspecified fall, initial encounter: Secondary | ICD-10-CM | POA: Diagnosis present

## 2022-05-02 DIAGNOSIS — I129 Hypertensive chronic kidney disease with stage 1 through stage 4 chronic kidney disease, or unspecified chronic kidney disease: Secondary | ICD-10-CM | POA: Diagnosis present

## 2022-05-02 DIAGNOSIS — K922 Gastrointestinal hemorrhage, unspecified: Secondary | ICD-10-CM | POA: Diagnosis present

## 2022-05-02 DIAGNOSIS — Z9989 Dependence on other enabling machines and devices: Secondary | ICD-10-CM | POA: Diagnosis not present

## 2022-05-02 DIAGNOSIS — G4733 Obstructive sleep apnea (adult) (pediatric): Secondary | ICD-10-CM | POA: Diagnosis present

## 2022-05-02 DIAGNOSIS — Y929 Unspecified place or not applicable: Secondary | ICD-10-CM | POA: Diagnosis not present

## 2022-05-02 DIAGNOSIS — Z8249 Family history of ischemic heart disease and other diseases of the circulatory system: Secondary | ICD-10-CM | POA: Diagnosis not present

## 2022-05-02 DIAGNOSIS — F03A0 Unspecified dementia, mild, without behavioral disturbance, psychotic disturbance, mood disturbance, and anxiety: Secondary | ICD-10-CM | POA: Diagnosis present

## 2022-05-02 DIAGNOSIS — G894 Chronic pain syndrome: Secondary | ICD-10-CM | POA: Diagnosis present

## 2022-05-02 DIAGNOSIS — F4323 Adjustment disorder with mixed anxiety and depressed mood: Secondary | ICD-10-CM | POA: Diagnosis present

## 2022-05-02 DIAGNOSIS — I10 Essential (primary) hypertension: Secondary | ICD-10-CM | POA: Diagnosis not present

## 2022-05-02 DIAGNOSIS — F419 Anxiety disorder, unspecified: Secondary | ICD-10-CM | POA: Diagnosis present

## 2022-05-02 DIAGNOSIS — Z79891 Long term (current) use of opiate analgesic: Secondary | ICD-10-CM | POA: Diagnosis not present

## 2022-05-02 DIAGNOSIS — K2211 Ulcer of esophagus with bleeding: Secondary | ICD-10-CM | POA: Diagnosis present

## 2022-05-02 DIAGNOSIS — D649 Anemia, unspecified: Secondary | ICD-10-CM | POA: Diagnosis present

## 2022-05-02 DIAGNOSIS — N183 Chronic kidney disease, stage 3 unspecified: Secondary | ICD-10-CM | POA: Diagnosis present

## 2022-05-02 DIAGNOSIS — K253 Acute gastric ulcer without hemorrhage or perforation: Secondary | ICD-10-CM | POA: Diagnosis not present

## 2022-05-02 DIAGNOSIS — K219 Gastro-esophageal reflux disease without esophagitis: Secondary | ICD-10-CM | POA: Diagnosis present

## 2022-05-02 DIAGNOSIS — G9341 Metabolic encephalopathy: Secondary | ICD-10-CM | POA: Diagnosis present

## 2022-05-02 HISTORY — PX: BIOPSY: SHX5522

## 2022-05-02 HISTORY — PX: ESOPHAGOGASTRODUODENOSCOPY (EGD) WITH PROPOFOL: SHX5813

## 2022-05-02 LAB — ABO/RH: ABO/RH(D): A POS

## 2022-05-02 LAB — PREPARE RBC (CROSSMATCH)

## 2022-05-02 LAB — MAGNESIUM: Magnesium: 2.1 mg/dL (ref 1.7–2.4)

## 2022-05-02 LAB — HEMOGLOBIN
Hemoglobin: 6 g/dL — CL (ref 12.0–15.0)
Hemoglobin: 8.1 g/dL — ABNORMAL LOW (ref 12.0–15.0)

## 2022-05-02 LAB — VITAMIN B12: Vitamin B-12: 312 pg/mL (ref 180–914)

## 2022-05-02 LAB — BASIC METABOLIC PANEL
Anion gap: 10 (ref 5–15)
BUN: 44 mg/dL — ABNORMAL HIGH (ref 6–20)
CO2: 18 mmol/L — ABNORMAL LOW (ref 22–32)
Calcium: 8.6 mg/dL — ABNORMAL LOW (ref 8.9–10.3)
Chloride: 107 mmol/L (ref 98–111)
Creatinine, Ser: 1.69 mg/dL — ABNORMAL HIGH (ref 0.44–1.00)
GFR, Estimated: 35 mL/min — ABNORMAL LOW (ref >60)
Glucose, Bld: 95 mg/dL (ref 70–99)
Potassium: 4.1 mmol/L (ref 3.5–5.1)
Sodium: 135 mmol/L (ref 135–145)

## 2022-05-02 LAB — CBC
HCT: 26.4 % — ABNORMAL LOW (ref 36.0–46.0)
Hemoglobin: 8.8 g/dL — ABNORMAL LOW (ref 12.0–15.0)
MCH: 30.7 pg (ref 26.0–34.0)
MCHC: 33.3 g/dL (ref 30.0–36.0)
MCV: 92 fL (ref 80.0–100.0)
Platelets: 399 10*3/uL (ref 150–400)
RBC: 2.87 MIL/uL — ABNORMAL LOW (ref 3.87–5.11)
RDW: 17.1 % — ABNORMAL HIGH (ref 11.5–15.5)
WBC: 9.3 10*3/uL (ref 4.0–10.5)
nRBC: 0 % (ref 0.0–0.2)

## 2022-05-02 LAB — IRON AND TIBC
Iron: 33 ug/dL (ref 28–170)
Saturation Ratios: 9 % — ABNORMAL LOW (ref 10.4–31.8)
TIBC: 377 ug/dL (ref 250–450)
UIBC: 344 ug/dL

## 2022-05-02 LAB — RETICULOCYTES
Immature Retic Fract: 35.3 % — ABNORMAL HIGH (ref 2.3–15.9)
RBC.: 2.16 MIL/uL — ABNORMAL LOW (ref 3.87–5.11)
Retic Count, Absolute: 101.3 10*3/uL (ref 19.0–186.0)
Retic Ct Pct: 4.7 % — ABNORMAL HIGH (ref 0.4–3.1)

## 2022-05-02 LAB — FOLATE: Folate: 22.2 ng/mL (ref >5.9)

## 2022-05-02 LAB — PROTIME-INR
INR: 1 (ref 0.8–1.2)
Prothrombin Time: 13.2 seconds (ref 11.4–15.2)

## 2022-05-02 LAB — FERRITIN: Ferritin: 18 ng/mL (ref 11–307)

## 2022-05-02 LAB — HEMATOCRIT: HCT: 18.6 % — ABNORMAL LOW (ref 36.0–46.0)

## 2022-05-02 LAB — HIV ANTIBODY (ROUTINE TESTING W REFLEX): HIV Screen 4th Generation wRfx: NONREACTIVE

## 2022-05-02 LAB — MRSA NEXT GEN BY PCR, NASAL: MRSA by PCR Next Gen: NOT DETECTED

## 2022-05-02 SURGERY — ESOPHAGOGASTRODUODENOSCOPY (EGD) WITH PROPOFOL
Anesthesia: Monitor Anesthesia Care

## 2022-05-02 MED ORDER — MORPHINE SULFATE ER 15 MG PO TBCR: 15.0000 mg | EXTENDED_RELEASE_TABLET | ORAL | Status: DC

## 2022-05-02 MED ORDER — SODIUM CHLORIDE 0.9 % IV SOLN: INTRAVENOUS | Status: DC | PRN

## 2022-05-02 MED ORDER — LIDOCAINE 2% (20 MG/ML) 5 ML SYRINGE
INTRAMUSCULAR | Status: DC | PRN
  Administered 2022-05-02: 40 mg via INTRAVENOUS

## 2022-05-02 MED ORDER — ACETAMINOPHEN 650 MG RE SUPP: 650.0000 mg | Freq: Four times a day (QID) | RECTAL | Status: DC | PRN

## 2022-05-02 MED ORDER — SODIUM CHLORIDE 0.9% FLUSH
3.0000 mL | Freq: Two times a day (BID) | INTRAVENOUS | Status: DC
  Administered 2022-05-02 – 2022-05-03 (×2): 3 mL via INTRAVENOUS

## 2022-05-02 MED ORDER — SODIUM CHLORIDE 0.9 % IV BOLUS
500.0000 mL | Freq: Once | INTRAVENOUS | Status: AC
  Administered 2022-05-02: 500 mL via INTRAVENOUS

## 2022-05-02 MED ORDER — BUSPIRONE HCL 5 MG PO TABS
10.0000 mg | ORAL_TABLET | Freq: Three times a day (TID) | ORAL | Status: DC
  Administered 2022-05-02 – 2022-05-03 (×2): 10 mg via ORAL
  Filled 2022-05-02 (×2): qty 2

## 2022-05-02 MED ORDER — SODIUM CHLORIDE 0.9% IV SOLUTION: Freq: Once | INTRAVENOUS | Status: AC

## 2022-05-02 MED ORDER — MORPHINE SULFATE ER 30 MG PO TBCR
30.0000 mg | EXTENDED_RELEASE_TABLET | Freq: Two times a day (BID) | ORAL | Status: DC
  Administered 2022-05-02 – 2022-05-03 (×2): 30 mg via ORAL
  Filled 2022-05-02 (×2): qty 1

## 2022-05-02 MED ORDER — ACETAMINOPHEN 325 MG PO TABS
650.0000 mg | ORAL_TABLET | Freq: Four times a day (QID) | ORAL | Status: DC | PRN
  Administered 2022-05-02: 650 mg via ORAL
  Filled 2022-05-02: qty 2

## 2022-05-02 MED ORDER — PROPOFOL 10 MG/ML IV BOLUS
INTRAVENOUS | Status: DC | PRN
  Administered 2022-05-02 (×2): 20 mg via INTRAVENOUS

## 2022-05-02 MED ORDER — PANTOPRAZOLE SODIUM 40 MG IV SOLR
INTRAVENOUS | Status: AC
  Filled 2022-05-02: qty 40

## 2022-05-02 MED ORDER — PHENYLEPHRINE 80 MCG/ML (10ML) SYRINGE FOR IV PUSH (FOR BLOOD PRESSURE SUPPORT)
PREFILLED_SYRINGE | INTRAVENOUS | Status: DC | PRN
  Administered 2022-05-02: 80 ug via INTRAVENOUS

## 2022-05-02 MED ORDER — OXYCODONE HCL 5 MG PO TABS
5.0000 mg | ORAL_TABLET | Freq: Three times a day (TID) | ORAL | Status: DC | PRN
  Administered 2022-05-02: 5 mg via ORAL
  Filled 2022-05-02 (×2): qty 1

## 2022-05-02 MED ORDER — LORAZEPAM 2 MG/ML IJ SOLN
0.5000 mg | Freq: Four times a day (QID) | INTRAMUSCULAR | Status: DC | PRN
  Administered 2022-05-02: 0.5 mg via INTRAVENOUS
  Filled 2022-05-02: qty 1

## 2022-05-02 MED ORDER — PROPOFOL 500 MG/50ML IV EMUL
INTRAVENOUS | Status: DC | PRN
  Administered 2022-05-02: 100 ug/kg/min via INTRAVENOUS

## 2022-05-02 SURGICAL SUPPLY — 15 items

## 2022-05-02 NOTE — Progress Notes (Signed)
PROGRESS NOTE    AYDE RECORD  GGE:[id number] DOB: 11-18-1965 DOA: 05/01/2022 PCP: Tresa Garter, MD    Brief Narrative:  Samantha [name] is a 56 years old female with history of hypertension, hydrocephalus with VP shunt at childhood, chronic pain on opiates presented to the hospital with confusion lightheadedness dizziness fall and generalized weakness.  Patient's husband reported that she was increasingly confused and had suffered a fall without loss of consciousness.  In the ED patient was afebrile and vitals were stable.  EKG showed prolonged QT interval.  Chest x-ray was unremarkable.  Creatinine was 2.03 which was off from 1.1 in April 2023.  Hemoglobin was low at 6.8 from 9.9 earlier this month.  Patient was started on IV Protonix and [facility] was consulted due to concerns for GI bleed.  Patient however denied any obvious gross bleeding.  She however complains of bad acid reflux and takes multiple doses of NSAIDs at home for her chronic neck and back pain.     Assessment and Plan:  Symptomatic anemia; occult GI bleeding  - Presents with increase presenting with lightheadedness dizziness falls and weakness.  Takes Mobic and Pepcid regularly for chronic back pain.  FOBT is positive in the ED but no overt bleeding.  Was started on IV PPI.  GI on board.  Will follow recommendation.  Will transfuse packed RBC.  Hemodynamically stable at this time.  Monitor H&H.  Patient will likely need upper endoscopy at this time to rule out ulcers.  Stated that she did have colonoscopy in the past which was unremarkable.  Renal insufficiency of unclear chronicity  - SCr is 2.03 on admission, was 2.46 on 04/15/22, but 1.15 in April 2023.  Patient is on olmesartan as outpatient.  We will hold.  Takes NSAIDs as outpatient as well.  Continue with IV fluid hydration.  Creatinine has improved to 1.6 today.    Acute metabolic encephalopathy  Increasingly confused but at this time is fully oriented.  Takes  morphine as outpatient and with decreased GFR could have caused confusion.  On decreased dose of morphine at this time   Chronic pain  Morphine has been decreased by 75%.   Essential hypertension  -Continue to hold olmesartan.   OSA  Continue CPAP at Nighttime   Prolonged QT interval  - QTc is 519 ms in ED.  Continue to monitor electrolytes and replace as necessary.    DVT prophylaxis: SCDs Start: 05/02/22 0530   Code Status:     Code Status: Full Code  Disposition: Home  Status is: Observation  The patient will require care spanning > 2 midnights and should be moved to inpatient because: Symptomatic anemia requiring PRBC transfusion, GI evaluation   Family Communication: Communicated with the patient at bedside  Consultants:  GI  Procedures:  PRBC transfusion  Antimicrobials:  None  Anti-infectives (From admission, onward)    None      Subjective: Today, patient was seen and examined at bedside.  Patient complains of fatigue weakness dizziness.  Denies overt bleeding.  No chest pain shortness of breath.  Objective: Vitals:   05/02/22 0646 05/02/22 0656 05/02/22 0700 05/02/22 0915  BP: 109/76  121/85 104/63  Pulse: 77 79 81 78  Resp: 14 18 15 13   Temp: 98.4 F (36.9 C)  98.6 F (37 C) 98.2 F (36.8 C)  TempSrc: Axillary  Oral Oral  SpO2: 100% 100% 100% 99%  Weight:      Height:  Intake/Output Summary (Last 24 hours) at 05/02/2022 0925 Last data filed at 05/02/2022 0515 Gross per 24 hour  Intake 28.51 ml  Output --  Net 28.51 ml   Filed Weights   05/01/22 2054  Weight: 68.5 kg    Physical Examination: Body mass index is 31.56 kg/m.  General: Obese built, not in obvious distress HENT: Pallor noted oral mucosa is moist.  Chest:  Clear breath sounds.  Diminished breath sounds bilaterally. No crackles or wheezes.  CVS: S1 &S2 heard. No murmur.  Regular rate and rhythm. Abdomen: Soft, nontender, nondistended.  Bowel sounds are heard.    Extremities: No cyanosis, clubbing or edema.  Peripheral pulses are palpable. Psych: Alert, awake and oriented, normal mood CNS:  No cranial nerve deficits.  Power equal in all extremities.   Skin: Warm and dry.  No rashes noted.  Data Reviewed:   CBC: Recent Labs  Lab 05/01/22 2145 05/02/22 0542  WBC 9.6  --   NEUTROABS 5.3  --   HGB 6.8* 6.0*  HCT 21.4* 18.6*  MCV 96.8  --   PLT 458*  --     Basic Metabolic Panel: Recent Labs  Lab 05/01/22 2145 05/02/22 0542  NA 132* 135  K 4.1 4.1  CL 106 107  CO2 19* 18*  GLUCOSE 99 95  BUN 53* 44*  CREATININE 2.03* 1.69*  CALCIUM 8.5* 8.6*  MG  --  2.1    Liver Function Tests: Recent Labs  Lab 05/01/22 2145  AST 17  ALT 15  ALKPHOS 44  BILITOT 0.5  PROT 5.7*  ALBUMIN 3.2*     Radiology Studies: CT Cervical Spine Wo Contrast  Result Date: 05/01/2022 CLINICAL DATA:  Neck trauma, midline tenderness (Age 42-64y) fall EXAM: CT CERVICAL SPINE WITHOUT CONTRAST TECHNIQUE: Multidetector CT imaging of the cervical spine was performed without intravenous contrast. Multiplanar CT image reconstructions were also generated. RADIATION DOSE REDUCTION: This exam was performed according to the departmental dose-optimization program which includes automated exposure control, adjustment of the mA and/or kV according to patient size and/or use of iterative reconstruction technique. COMPARISON:  MRI cervical spine 03/02/2013 FINDINGS: Alignment: Normal. Skull base and vertebrae: Prominent left C5 transverse foramina likely due to a tortuous vertebral artery. No acute fracture. No aggressive appearing focal osseous lesion or focal pathologic process. Soft tissues and spinal canal: No prevertebral fluid or swelling. No visible canal hematoma. Upper chest: Unremarkable. Other: None. IMPRESSION: No acute displaced fracture or traumatic listhesis of the cervical spine. Electronically Signed   By: Tish Frederickson M.D.   On: 05/01/2022 22:21   CT Head  Wo Contrast  Result Date: 05/01/2022 CLINICAL DATA:  Mental status change, unknown cause EXAM: CT HEAD WITHOUT CONTRAST TECHNIQUE: Contiguous axial images were obtained from the base of the skull through the vertex without intravenous contrast. RADIATION DOSE REDUCTION: This exam was performed according to the departmental dose-optimization program which includes automated exposure control, adjustment of the mA and/or kV according to patient size and/or use of iterative reconstruction technique. COMPARISON:  CT head 10/21/2015 BRAIN: BRAIN Similar-appearing right posterior porch ventriculoperitoneal shunt with tip again noted to terminate within the inferior horn of the right lateral ventricle. Right frontotemporal encephalomalacia. No evidence of large-territorial acute infarction. No parenchymal hemorrhage. No mass lesion. No extra-axial collection. No mass effect or midline shift. Similar appearance of the lateral ventricles and third ventricle. Basilar cisterns are patent. Vascular: No hyperdense vessel. Vascular clipping again noted at the central skull base. Skull: No acute fracture  or focal lesion.  Right frontal craniotomy. Sinuses/Orbits: Paranasal sinuses and mastoid air cells are clear. The orbits are unremarkable. Other: None. IMPRESSION: No acute intracranial abnormality in a patient with known right posterior approach ventriculoperitoneal shunt. Stable size of lateral and third ventricles compared to 2016. Electronically Signed   By: Tish Frederickson M.D.   On: 05/01/2022 21:33   DG Chest Portable 1 View  Result Date: 05/01/2022 CLINICAL DATA:  Altered mental status EXAM: PORTABLE CHEST 1 VIEW COMPARISON:  08/29/2018 FINDINGS: Mild cardiomegaly. Small hiatal hernia. No focal airspace consolidation, pleural effusion, or pneumothorax. Right-sided VP shunt catheter with similar degree of irregularity of the catheter contours within the chest. Possible catheter discontinuity the level of the right  sixth rib. IMPRESSION: 1. No acute cardiopulmonary disease. 2. Small hiatal hernia. 3. Possible VP shunt catheter discontinuity within the mid chest. Electronically Signed   By: Duanne Guess D.O.   On: 05/01/2022 21:22      LOS: 0 days    Joycelyn Das, MD [facility] Available via Epic secure chat 7am-7pm After these hours, please refer to coverage provider listed on [email or web address] 05/02/2022, 9:25 AM

## 2022-05-02 NOTE — H&P (Signed)
History and Physical    Samantha [name] [name] YYT:[id number] DOB: 1965-12-04 DOA: 05/01/2022  PCP: Tresa Garter, MD   Patient coming from: Home   Chief Complaint: Increased confusion, lightheaded, fall   HPI: Samantha [name] [name] is a Samantha [name] 56 y.o. female with medical history significant for hypertension, memory loss, hydrocephalus with VP shunt placed in childhood, chronic pain on opiates, now presenting with increased confusion, lightheadedness, and a fall.  Patient's husband reports that she has been increasingly confused over the past few days and suffered a fall today, hitting her back and head without losing consciousness.  Patient reports that she was lightheaded prior to the fall.  She denies any abdominal pain or vomiting, but has had some nausea for a few days.  She has not noticed any melena or hematochezia.  [facility] ED Course: Upon arrival to the ED, patient is found to be afebrile and saturating well on room air with systolic blood pressure in the 90s and higher.  EKG features sinus rhythm with prolonged QT interval.  No acute findings noted on head CT.  No acute cardiopulmonary disease on chest x-ray.  Chemistry panel notable for sodium 132, BUN 53, and creatinine 2.03, down from June 2nd but up from 1.15 in April 2023.  Hemoglobin is 6.8, down from 9.9 earlier this month.  Patient was started on IV Protonix and [facility] was consulted by the ED physician.  She was transferred to [facility] for admission.  Review of Systems:  All other systems reviewed and apart from HPI, are negative.  Past Medical History:  Diagnosis Date   Anemia    Anxiety    Cerebral ventricular shunt fitting or adjustment    COMMON MIGRAINE 10/01/2007   Chronic     Depression    GERD (gastroesophageal reflux disease)    GLUCOSE INTOLERANCE 10/01/2007   Qualifier: Diagnosis of  By: Jonny Ruiz MD, Len Blalock    Hiatal hernia    Hydrocephalus (HCC)    Hyperlipidemia    HYPERLIPIDEMIA 10/01/2007    Chronic. Lovaza is too $$$ Declined statins due to worries    IDA (iron deficiency anemia)    Memory loss    Migraine    OBSTRUCTIVE SLEEP APNEA 10/01/2007   NPSG 2006:  AHI 9/hr Started cpap 2009 successfully.      Paresthesia    Schatzki's ring    Sleep apnea    Stroke [facility]) 03/2013    Past Surgical History:  Procedure Laterality Date   Ames Shunt  1976,   cerebral vascular shunt/ with a revision 1981   CHOLECYSTECTOMY  1981   CYST REMOVAL NECK     Pituitary cyst w/subsequent shunt      Social History:   reports that she has never smoked. She has never used smokeless tobacco. She reports current alcohol use of about 1.0 standard drink of alcohol per week. She reports that she does not use drugs.  Allergies  Allergen Reactions   Alprazolam Other (See Comments)    Crying all the time   Citalopram Hydrobromide     REACTION: low libido    Family History  Problem Relation Age of Onset   Dementia Mother    Kidney cancer Mother    Alzheimer's disease Mother    Migraines Mother    Colon polyps Mother    Heart disease Father    Diabetes Father    Melanoma Other    Lung cancer Other    Lung cancer Other  Brain cancer Maternal Aunt    Lung cancer Maternal Uncle    Esophageal cancer Maternal Grandmother    Alzheimer's disease Paternal Grandmother    Colon cancer Neg Hx      Prior to Admission medications   Medication Sig Start Date End Date Taking? Authorizing Provider  aspirin 81 MG tablet Take 81 mg by mouth daily.    [provider]  atorvastatin (LIPITOR) 20 MG tablet TAKE 1 TABLET EVERY DAY AT 6 PM 06/17/21   Plotnikov, Georgina Quint, MD  busPIRone (BUSPAR) 10 MG tablet TAKE 1 TABLET THREE TIMES DAILY 02/23/22   Plotnikov, Georgina Quint, MD  Calcium Carbonate-Vitamin D (CALCIUM-VITAMIN D) 500-200 MG-UNIT per tablet Take 1 tablet by mouth 2 (two) times daily with a meal.    [provider]  Docusate Calcium (STOOL SOFTENER PO) Take by mouth 2 (two) times  daily.    [provider]  donepezil (ARICEPT) 10 MG tablet TAKE 1 TABLET AT BEDTIME 03/31/22   Plotnikov, Georgina Quint, MD  DULoxetine (CYMBALTA) 60 MG capsule TAKE 1 CAPSULE EVERY DAY 12/23/21   Plotnikov, Georgina Quint, MD  famotidine (PEPCID) 40 MG tablet TAKE 1 TABLET EVERY DAY 12/23/21   Plotnikov, Georgina Quint, MD  ferrous sulfate 325 (65 FE) MG tablet Take 1 tablet (325 mg total) by mouth daily. 01/16/22 01/16/23  Plotnikov, Georgina Quint, MD  gabapentin (NEURONTIN) 300 MG capsule Take 300 mg by mouth 3 (three) times daily.    [provider]  lactulose (CHRONULAC) 10 GM/15ML solution Take 30-45 mLs (20-30 g total) by mouth 2 (two) times daily as needed for moderate constipation or severe constipation. 07/18/18   Plotnikov, Georgina Quint, MD  lamoTRIgine (LAMICTAL) 100 MG tablet TAKE 1 TABLET EVERY MORNING, AND 1 AND 1/2 TABLETS AT NIGHT. 01/14/22   Plotnikov, Georgina Quint, MD  levofloxacin (LEVAQUIN) 500 MG tablet Take 1 tablet (500 mg total) by mouth daily. 04/15/22   Plotnikov, Georgina Quint, MD  lidocaine (LIDODERM) 5 % Place 1 patch onto the skin daily. Remove & Discard patch within 12 hours or as directed by MD 01/14/19   Plotnikov, Georgina Quint, MD  LINZESS 290 MCG CAPS capsule TAKE 1 CAPSULE  DAILY BEFORE BREAKFAST 02/02/22   Plotnikov, Georgina Quint, MD  meloxicam (MOBIC) 15 MG tablet TAKE 1/2 TO 1 TABLET BY MOUTH DAILY AS NEEDED FOR PAIN 12/27/21   Plotnikov, Georgina Quint, MD  memantine (NAMENDA) 5 MG tablet Take 1 tablet (5 mg total) by mouth 2 (two) times daily. 09/02/21   Plotnikov, Georgina Quint, MD  morphine (MS CONTIN) 30 MG 12 hr tablet Take 1 tablet (30 mg total) by mouth every 12 (twelve) hours. 04/28/22   Plotnikov, Georgina Quint, MD  olmesartan (BENICAR) 20 MG tablet Take 1 tablet (20 mg total) by mouth daily. 03/09/22 03/09/23  Plotnikov, Georgina Quint, MD  Oxycodone HCl 10 MG TABS Take 1 tablet (10 mg total) by mouth 3 (three) times daily as needed. 04/28/22   Plotnikov, Georgina Quint, MD  Probiotic Product (ALIGN) 4 MG CAPS  Take 1 capsule (4 mg total) by mouth daily. 01/23/20   Plotnikov, Georgina Quint, MD  promethazine (PHENERGAN) 12.5 MG tablet Take 1 tablet (12.5 mg total) by mouth every 6 (six) hours as needed for nausea or vomiting. 04/28/22   Plotnikov, Georgina Quint, MD    Physical Exam: Vitals:   05/02/22 0512 05/02/22 0605 05/02/22 0615 05/02/22 0625  BP: 118/62 96/74 112/65 112/65  Pulse: 95 85 85 85  Resp: 18  11 15 16   Temp: 99.1 F (37.3 C)  98.3 F (36.8 C) 98.3 F (36.8 C)  TempSrc: Oral  Axillary Axillary  SpO2: 99% 100% 97% 100%  Weight:      Height:        Constitutional: NAD, calm, pale  Eyes: PERTLA, lids and conjunctivae normal ENMT: Mucous membranes are moist. Posterior pharynx clear of any exudate or lesions.   Neck: supple, no masses  Respiratory: no wheezing, no crackles. No accessory muscle use.  Cardiovascular: S1 & S2 heard, regular rate and rhythm. No significant JVD. Abdomen: No distension, soft, no rebound pain or guarding. Bowel sounds active.  Musculoskeletal: no clubbing / cyanosis. No joint deformity upper and lower extremities.   Skin: no significant rashes, lesions, ulcers. Warm, dry, well-perfused. Neurologic: CN 2-12 grossly intact. Moving all extremities. Alert and oriented to person, place, and situation.  Psychiatric: Samantha [name]. Cooperative.    Labs and Imaging on Admission: I have personally reviewed following labs and imaging studies  CBC: Recent Labs  Lab 05/01/22 2145 05/02/22 0542  WBC 9.6  --   NEUTROABS 5.3  --   HGB 6.8* 6.0*  HCT 21.4* 18.6*  MCV 96.8  --   PLT 458*  --    Basic Metabolic Panel: Recent Labs  Lab 05/01/22 2145  NA 132*  K 4.1  CL 106  CO2 19*  GLUCOSE 99  BUN 53*  CREATININE 2.03*  CALCIUM 8.5*   GFR: Estimated Creatinine Clearance: 24.7 mL/min (A) (by C-G formula based on SCr of 2.03 mg/dL (H)). Liver Function Tests: Recent Labs  Lab 05/01/22 2145  AST 17  ALT 15  ALKPHOS 44  BILITOT 0.5  PROT 5.7*  ALBUMIN  3.2*   No results for input(s): "LIPASE", "AMYLASE" in the last 168 hours. Recent Labs  Lab 05/01/22 2146  AMMONIA 11   Coagulation Profile: Recent Labs  Lab 05/02/22 0542  INR 1.0   Cardiac Enzymes: No results for input(s): "CKTOTAL", "CKMB", "CKMBINDEX", "TROPONINI" in the last 168 hours. BNP (last 3 results) No results for input(s): "PROBNP" in the last 8760 hours. HbA1C: No results for input(s): "HGBA1C" in the last 72 hours. CBG: No results for input(s): "GLUCAP" in the last 168 hours. Lipid Profile: No results for input(s): "CHOL", "HDL", "LDLCALC", "TRIG", "CHOLHDL", "LDLDIRECT" in the last 72 hours. Thyroid Function Tests: No results for input(s): "TSH", "T4TOTAL", "FREET4", "T3FREE", "THYROIDAB" in the last 72 hours. Anemia Panel: Recent Labs    05/01/22 2347  RETICCTPCT 4.7*   Urine analysis:    Component Value Date/Time   COLORURINE YELLOW 05/01/2022 2146   APPEARANCEUR CLEAR 05/01/2022 2146   LABSPEC 1.015 05/01/2022 2146   PHURINE 5.0 05/01/2022 2146   GLUCOSEU NEGATIVE 05/01/2022 2146   GLUCOSEU NEGATIVE 04/15/2022 1135   HGBUR NEGATIVE 05/01/2022 2146   BILIRUBINUR NEGATIVE 05/01/2022 2146   KETONESUR NEGATIVE 05/01/2022 2146   PROTEINUR NEGATIVE 05/01/2022 2146   UROBILINOGEN 0.2 04/15/2022 1135   NITRITE NEGATIVE 05/01/2022 2146   LEUKOCYTESUR NEGATIVE 05/01/2022 2146   Sepsis Labs: @LABRCNTIP (procalcitonin:4,lacticidven:4) )No results found for this or any previous visit (from the past 240 hour(s)).   Radiological Exams on Admission: CT Cervical Spine Wo Contrast  Result Date: 05/01/2022 CLINICAL DATA:  Neck trauma, midline tenderness (Age 26-64y) fall EXAM: CT CERVICAL SPINE WITHOUT CONTRAST TECHNIQUE: Multidetector CT imaging of the cervical spine was performed without intravenous contrast. Multiplanar CT image reconstructions were also generated. RADIATION DOSE REDUCTION: This exam was performed according to the departmental  dose-optimization  program which includes automated exposure control, adjustment of the mA and/or kV according to patient size and/or use of iterative reconstruction technique. COMPARISON:  MRI cervical spine 03/02/2013 FINDINGS: Alignment: Normal. Skull base and vertebrae: Prominent left C5 transverse foramina likely due to a tortuous vertebral artery. No acute fracture. No aggressive appearing focal osseous lesion or focal pathologic process. Soft tissues and spinal canal: No prevertebral fluid or swelling. No visible canal hematoma. Upper chest: Unremarkable. Other: None. IMPRESSION: No acute displaced fracture or traumatic listhesis of the cervical spine. Electronically Signed   By: Tish FredericksonMorgane  Naveau M.D.   On: 05/01/2022 22:21   CT Head Wo Contrast  Result Date: 05/01/2022 CLINICAL DATA:  Mental status change, unknown cause EXAM: CT HEAD WITHOUT CONTRAST TECHNIQUE: Contiguous axial images were obtained from the base of the skull through the vertex without intravenous contrast. RADIATION DOSE REDUCTION: This exam was performed according to the departmental dose-optimization program which includes automated exposure control, adjustment of the mA and/or kV according to patient size and/or use of iterative reconstruction technique. COMPARISON:  CT head 10/21/2015 BRAIN: BRAIN Similar-appearing right posterior porch ventriculoperitoneal shunt with tip again noted to terminate within the inferior horn of the right lateral ventricle. Right frontotemporal encephalomalacia. No evidence of large-territorial acute infarction. No parenchymal hemorrhage. No mass lesion. No extra-axial collection. No mass effect or midline shift. Similar appearance of the lateral ventricles and third ventricle. Basilar cisterns are patent. Vascular: No hyperdense vessel. Vascular clipping again noted at the central skull base. Skull: No acute fracture or focal lesion.  Right frontal craniotomy. Sinuses/Orbits: Paranasal sinuses and mastoid  air cells are clear. The orbits are unremarkable. Other: None. IMPRESSION: No acute intracranial abnormality in a patient with known right posterior approach ventriculoperitoneal shunt. Stable size of lateral and third ventricles compared to 2016. Electronically Signed   By: Tish FredericksonMorgane  Naveau M.D.   On: 05/01/2022 21:33   DG Chest Portable 1 View  Result Date: 05/01/2022 CLINICAL DATA:  Altered mental status EXAM: PORTABLE CHEST 1 VIEW COMPARISON:  08/29/2018 FINDINGS: Mild cardiomegaly. Small hiatal hernia. No focal airspace consolidation, pleural effusion, or pneumothorax. Right-sided VP shunt catheter with similar degree of irregularity of the catheter contours within the chest. Possible catheter discontinuity the level of the right sixth rib. IMPRESSION: 1. No acute cardiopulmonary disease. 2. Small hiatal hernia. 3. Possible VP shunt catheter discontinuity within the mid chest. Electronically Signed   By: Duanne GuessNicholas  Plundo D.O.   On: 05/01/2022 21:22    EKG: Independently reviewed. Sinus rhythm, QTc 519 ms.   Assessment/Plan   1. Symptomatic anemia; occult GI bleeding  - Presents with increased confusion and lightheadedness with a fall and is found to have Hgb of 6.8, down from 9.9 on 04/15/22  - She takes Mobic regularly, also takes Pepcid, denies abdominal pain or vomiting but has had nausea  - FOBT is positive in ED without overt bleeding  - She was started on IV PPI in ED and GI consulted by ED physician  - Continue IV PPI, transfuse 1 unit RBC, trend H&H, follow-up GI recommendations    2. Renal insufficiency of unclear chronicity  - SCr is 2.03 on admission, was 2.46 on 04/15/22, but 1.15 in April 2023  - Hold olmesartan, transfuse RBC, renally-dose medications, monitor    3. Acute encephalopathy  - Has been increasingly confused recently per report of her husband  - No acute findings on head CT, ammonia is normal, suspect this is from morphine with decreased GFR  -  Reduce morphine by  75%, delirium precautions    4. Chronic pain  - Reduce morphine by 75% in light of new renal insufficiency and increased confusion    5. Hypertension  - SBP 90s in ED  - Hold olmesartan   6. OSA  - CPAP qHS    7. Prolonged QT interval  - QTc is 519 ms in ED  - Check magnesium level, continue cardiac monitoring, avoid QT-prolonging medications    DVT prophylaxis: SCDs  Code Status: Full  Level of Care: Level of care: Progressive Family Communication: Husband by phone  Disposition Plan:  Patient is from: home  Anticipated d/c is to: Home  Anticipated d/c date is: 05/04/22 Patient currently: Pending stable H&H, GI consultation, improved/stable renal function Consults called: GI consulted by ED physician  Admission status: Observation     Briscoe Deutscher, MD [facility]  05/02/2022, 6:38 AM

## 2022-05-02 NOTE — Consult Note (Addendum)
[facility] Consult: 8:51 AM 05/02/2022  LOS: 0 days    Referring Provider: Dr Louanne Belton  Primary Care Physician:  Cassandria Anger, MD Primary Gastroenterologist:  Dr. Zenovia Jarred.  Last OV 2016.       Reason for Consultation:  Portal anemia.  Tarry stools, FOBT +   HPI: CLARIECE ROESLER is a 56 y.o. female.  PMH chronic pain, neck/back/hips, chronic narcotics.  Mild dementia.  Obstructive hydrocephalus, sp VP shunt.  GERD.  Constipation.  CKD stage 3b to 4, GFR 21 3 weeks ago.   10/2014 EGD, for eval IDA. Nonobstructing Schatzki's ring at 30 cm, a 4 cm hiatal hernia and several nonbleeding Cameron's erosions. Stomach otherwise normal. Duodenum normal, SB biopsies negative for celiac disease.  10/2014 Colonoscopy: normal.    Restarted po iron early March (Hgb 9.9, normal MCV. Ferritin 3, iron 26).  Eval of confusion at home at OV a few weeks ago (using CBD Delta 8 which she has now discontinued). Med list includes Mobic daily, ibuprofen 400 mg tid,  MS contin, Oxycodone, 81 ASA, Linzess, stool softener, lactulose for constipation, pepcid.   Presented to [facility] after fall, hitting head at home.  Possible light headedness/dizziness.  No LOC. C/O head, neck pain.  Describes tarry, black stools for about 2 or 3 months.  Generally has bowel movements daily.  Endorses decreased appetite without weight loss.  No nausea, vomiting, dysphagia, bleeding per rectum.  No excessive bleeding or bruising.   Hgb 9.6 in 08/2021.  Currently it is 6.  MCV 96.  Anemia panel pndg.   Received 1 of 1 PRBCs this morning.  Started on PPI drip.  GFR 28.. 35 overnight.   T bili, alk phos normal.  AST/ALT 39/92.. 17/15; 85/104 a month ago.       INR 1.   CT head, neck: nothing acute.   Stool FOBT positive.    Maternal grandmother died in  her 65s with esophageal cancer.  Her mother died at 22 from Alzheimer's and had cholecystectomy.  Father died at 44 with a massive MI. Lives at home with her husband.  Does not smoke.  Does not drink alcohol.  Past Medical History:  Diagnosis Date   Anemia    Anxiety    Cerebral ventricular shunt fitting or adjustment    COMMON MIGRAINE 10/01/2007   Chronic     Depression    GERD (gastroesophageal reflux disease)    GLUCOSE INTOLERANCE 10/01/2007   Qualifier: Diagnosis of  By: Jenny Reichmann MD, Hunt Oris    Hiatal hernia    Hydrocephalus ([place])    Hyperlipidemia    HYPERLIPIDEMIA 10/01/2007   Chronic. Lovaza is too $$$ Declined statins due to worries    IDA (iron deficiency anemia)    Memory loss    Migraine    OBSTRUCTIVE SLEEP APNEA 10/01/2007   NPSG 2006:  AHI 9/hr Started cpap 2009 successfully.      Paresthesia    Schatzki's ring    Sleep apnea    Stroke [facility]) 03/2013    Past Surgical  History:  Procedure Laterality Date   [place] Shunt  1976,   cerebral vascular shunt/ with a revision Carrick     Pituitary cyst w/subsequent shunt      Prior to Admission medications   Medication Sig Start Date End Date Taking? Authorizing Provider  aspirin 81 MG tablet Take 81 mg by mouth daily.    [provider]  atorvastatin (LIPITOR) 20 MG tablet TAKE 1 TABLET EVERY DAY AT 6 PM 06/17/21   Plotnikov, Evie Lacks, MD  busPIRone (BUSPAR) 10 MG tablet TAKE 1 TABLET THREE TIMES DAILY 02/23/22   Plotnikov, Evie Lacks, MD  Calcium Carbonate-Vitamin D (CALCIUM-VITAMIN D) 500-200 MG-UNIT per tablet Take 1 tablet by mouth 2 (two) times daily with a meal.    [provider]  Docusate Calcium (STOOL SOFTENER PO) Take by mouth 2 (two) times daily.    [provider]  donepezil (ARICEPT) 10 MG tablet TAKE 1 TABLET AT BEDTIME 03/31/22   Plotnikov, Evie Lacks, MD  DULoxetine (CYMBALTA) 60 MG capsule TAKE 1 CAPSULE EVERY DAY 12/23/21   Plotnikov,  Evie Lacks, MD  famotidine (PEPCID) 40 MG tablet TAKE 1 TABLET EVERY DAY 12/23/21   Plotnikov, Evie Lacks, MD  ferrous sulfate 325 (65 FE) MG tablet Take 1 tablet (325 mg total) by mouth daily. 01/16/22 01/16/23  Plotnikov, Evie Lacks, MD  gabapentin (NEURONTIN) 300 MG capsule Take 300 mg by mouth 3 (three) times daily.    [provider]  lactulose (CHRONULAC) 10 GM/15ML solution Take 30-45 mLs (20-30 g total) by mouth 2 (two) times daily as needed for moderate constipation or severe constipation. 07/18/18   Plotnikov, Evie Lacks, MD  lamoTRIgine (LAMICTAL) 100 MG tablet TAKE 1 TABLET EVERY MORNING, AND 1 AND 1/2 TABLETS AT NIGHT. 01/14/22   Plotnikov, Evie Lacks, MD  levofloxacin (LEVAQUIN) 500 MG tablet Take 1 tablet (500 mg total) by mouth daily. 04/15/22   Plotnikov, Evie Lacks, MD  lidocaine (LIDODERM) 5 % Place 1 patch onto the skin daily. Remove & Discard patch within 12 hours or as directed by MD 01/14/19   Plotnikov, Evie Lacks, MD  LINZESS 290 MCG CAPS capsule TAKE 1 CAPSULE  DAILY BEFORE BREAKFAST 02/02/22   Plotnikov, Evie Lacks, MD  meloxicam (MOBIC) 15 MG tablet TAKE 1/2 TO 1 TABLET BY MOUTH DAILY AS NEEDED FOR PAIN 12/27/21   Plotnikov, Evie Lacks, MD  memantine (NAMENDA) 5 MG tablet Take 1 tablet (5 mg total) by mouth 2 (two) times daily. 09/02/21   Plotnikov, Evie Lacks, MD  morphine (MS CONTIN) 30 MG 12 hr tablet Take 1 tablet (30 mg total) by mouth every 12 (twelve) hours. 04/28/22   Plotnikov, Evie Lacks, MD  naloxone [facility]) nasal spray 4 mg/0.1 mL SMARTSIG:Both Nares 04/29/22   [provider]  olmesartan (BENICAR) 20 MG tablet Take 1 tablet (20 mg total) by mouth daily. 03/09/22 03/09/23  Plotnikov, Evie Lacks, MD  Oxycodone HCl 10 MG TABS Take 1 tablet (10 mg total) by mouth 3 (three) times daily as needed. 04/28/22   Plotnikov, Evie Lacks, MD  Probiotic Product (ALIGN) 4 MG CAPS Take 1 capsule (4 mg total) by mouth daily. 01/23/20   Plotnikov, Evie Lacks, MD  promethazine (PHENERGAN) 12.5 MG  tablet Take 1 tablet (12.5 mg total) by mouth every 6 (six) hours as needed for nausea or vomiting. 04/28/22   Plotnikov, Evie Lacks, MD    Scheduled Meds:  sodium chloride  Intravenous Once   sodium chloride   Intravenous Once   morphine  15 mg Oral Q24H   pantoprazole       [START ON 05/05/2022] pantoprazole  40 mg Intravenous Q12H   sodium chloride flush  3 mL Intravenous Q12H   Infusions:  pantoprazole 8 mg/hr (05/02/22 0515)   PRN Meds: acetaminophen **OR** acetaminophen, oxyCODONE, pantoprazole   Allergies as of 05/01/2022 - Review Complete 05/01/2022  Allergen Reaction Noted   Alprazolam Other (See Comments) 09/20/2019   Citalopram hydrobromide  06/29/2010    Family History  Problem Relation Age of Onset   Dementia Mother    Kidney cancer Mother    Alzheimer's disease Mother    Migraines Mother    Colon polyps Mother    Heart disease Father    Diabetes Father    Melanoma Other    Lung cancer Other    Lung cancer Other    Brain cancer Maternal Aunt    Lung cancer Maternal Uncle    Esophageal cancer Maternal Grandmother    Alzheimer's disease Paternal Grandmother    Colon cancer Neg Hx     Social History   Socioeconomic History   Marital status: Married    Spouse name: Elta Guadeloupe   Number of children: 0   Years of education: 12   Highest education level: Not on file  Occupational History   Occupation: [employer]    Comment: Therapist, art Rep   Occupation: disabled  Tobacco Use   Smoking status: Never   Smokeless tobacco: Never  Substance and Sexual Activity   Alcohol use: Yes    Alcohol/week: 1.0 standard drink of alcohol    Types: 1 Glasses of wine per week    Comment: Social   Drug use: No   Sexual activity: Yes  Other Topics Concern   Not on file  Social History Narrative   Mother is in a NH.    Patient lives at home with her husband Elta Guadeloupe). 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    High school education.   Right handed.   Caffeine 1 cup  daily.   She has no children   Social Determinants of Health   Financial Resource Strain: Low Risk  (09/02/2021)   Overall Financial Resource Strain (CARDIA)    Difficulty of Paying Living Expenses: Not hard at all  Food Insecurity: No Food Insecurity (09/02/2021)   Hunger Vital Sign    Worried About Running Out of Food in the Last Year: Never true    Ran Out of Food in the Last Year: Never true  Transportation Needs: No Transportation Needs (09/02/2021)   PRAPARE - Hydrologist (Medical): No    Lack of Transportation (Non-Medical): No  Physical Activity: Inactive (09/02/2021)   Exercise Vital Sign    Days of Exercise per Week: 0 days    Minutes of Exercise per Session: 0 min  Stress: No Stress Concern Present (09/02/2021)   [place]    Feeling of Stress : Not at all  Social Connections: [place] (09/02/2021)   Social Connection and Isolation Panel [NHANES]    Frequency of Communication with Friends and Family: More than three times a week    Frequency of Social Gatherings with Friends and Family: More than three times a week    Attends Religious Services: 1 to 4 times per year    Active Member of [employer] or Organizations: Yes  Attends Archivist Meetings: 1 to 4 times per year    Marital Status: Married  Human resources officer Violence: Not At Risk (09/02/2021)   Humiliation, Afraid, Rape, and Kick questionnaire    Fear of Current or Ex-Partner: No    Emotionally Abused: No    Physically Abused: No    Sexually Abused: No    REVIEW OF SYSTEMS: Constitutional: No profound weakness or fatigue. ENT:  No nose bleeds Pulm: No shortness of breath or cough. CV:  No palpitations, no LE edema.  No angina. GU:  No hematuria, no frequency GI: See HPI. Heme: Per HPI. Transfusions: 1 unit PRBCs thus far.  No previous blood transfusions. Neuro:  No headaches, no peripheral  tingling or numbness.  No seizures, no syncope. Derm:  No itching, no rash or sores.  Endocrine:  No sweats or chills.  No polyuria or dysuria Immunization: Reviewed. Travel: Not queried.   PHYSICAL EXAM: Vital signs in last 24 hours: Vitals:   05/02/22 0656 05/02/22 0700  BP:  121/85  Pulse: 79 81  Resp: 18 15  Temp:  98.6 F (37 C)  SpO2: 100% 100%   Wt Readings from Last 3 Encounters:  05/01/22 68.5 kg  04/28/22 67.6 kg  04/15/22 69.9 kg    General: Patient is a bit pale but looks otherwise well.  She is overweight.  Resting comfortably on the bed.  Calm, alert Head: No facial asymmetry or swelling.  No signs of head trauma. Eyes: Conjunctiva slightly pale. Ears: Obvious hearing deficit. Nose: No congestion or discharge Mouth: Mucous membranes moist, pink, clear.  Tongue midline.  Upper full dentures in place. Neck: No JVD, no masses, no thyromegaly. Lungs: Clear bilaterally with excellent breath sounds. Heart: RRR.  No MRG.  S1, S2 present Abdomen: Soft.  Not distended.  Active bowel sounds.  No HSM, masses, bruits, hernias..   Rectal: Rectal exam not repeated.  Stool submitted to lab tested FOBT positive. Musc/Skeltl: Slight to spinal scoliosis.  No obvious joint redness or swelling. Extremities: No CCE. Neurologic: Oriented x3.  Moves all 4 limbs without tremor, strength not tested.  At times difficulty focusing on questioning, answering questions but for the most part accurate answers to questions. Skin: No rash, no sores, no suspicious lesions. Nodes: No cervical adenopathy Psych: Calm, cooperative, pleasant.  Intake/Output from previous day: 06/18 0701 - 06/19 0700 In: 28.5 [I.V.:28.5] Out: -  Intake/Output this shift: No intake/output data recorded.  LAB RESULTS: Recent Labs    05/01/22 2145 05/02/22 0542  WBC 9.6  --   HGB 6.8* 6.0*  HCT 21.4* 18.6*  PLT 458*  --    BMET Lab Results  Component Value Date   NA 135 05/02/2022   NA 132 (L)  05/01/2022   NA 136 04/15/2022   K 4.1 05/02/2022   K 4.1 05/01/2022   K 4.5 04/15/2022   CL 107 05/02/2022   CL 106 05/01/2022   CL 103 04/15/2022   CO2 18 (L) 05/02/2022   CO2 19 (L) 05/01/2022   CO2 24 04/15/2022   GLUCOSE 95 05/02/2022   GLUCOSE 99 05/01/2022   GLUCOSE 108 (H) 04/15/2022   BUN 44 (H) 05/02/2022   BUN 53 (H) 05/01/2022   BUN 55 (H) 04/15/2022   CREATININE 1.69 (H) 05/02/2022   CREATININE 2.03 (H) 05/01/2022   CREATININE 2.46 (H) 04/15/2022   CALCIUM 8.6 (L) 05/02/2022   CALCIUM 8.5 (L) 05/01/2022   CALCIUM 9.2 04/15/2022   LFT Recent Labs  05/01/22 2145  PROT 5.7*  ALBUMIN 3.2*  AST 17  ALT 15  ALKPHOS 44  BILITOT 0.5   PT/INR Lab Results  Component Value Date   INR 1.0 05/02/2022   Hepatitis Panel No results for input(s): "HEPBSAG", "HCVAB", "HEPAIGM", "HEPBIGM" in the last 72 hours. C-Diff No components found for: "CDIFF" Lipase     Component Value Date/Time   LIPASE 41 07/12/2015 0230    Drugs of Abuse  No results found for: "LABOPIA", "COCAINSCRNUR", "LABBENZ", "AMPHETMU", "THCU", "LABBARB"   RADIOLOGY STUDIES: CT Cervical Spine Wo Contrast  Result Date: 05/01/2022 CLINICAL DATA:  Neck trauma, midline tenderness (Age 24-64y) fall EXAM: CT CERVICAL SPINE WITHOUT CONTRAST TECHNIQUE: Multidetector CT imaging of the cervical spine was performed without intravenous contrast. Multiplanar CT image reconstructions were also generated. RADIATION DOSE REDUCTION: This exam was performed according to the departmental dose-optimization program which includes automated exposure control, adjustment of the mA and/or kV according to patient size and/or use of iterative reconstruction technique. COMPARISON:  MRI cervical spine 03/02/2013 FINDINGS: Alignment: Normal. Skull base and vertebrae: Prominent left C5 transverse foramina likely due to a tortuous vertebral artery. No acute fracture. No aggressive appearing focal osseous lesion or focal pathologic  process. Soft tissues and spinal canal: No prevertebral fluid or swelling. No visible canal hematoma. Upper chest: Unremarkable. Other: None. IMPRESSION: No acute displaced fracture or traumatic listhesis of the cervical spine. Electronically Signed   By: Iven Finn M.D.   On: 05/01/2022 22:21   CT Head Wo Contrast  Result Date: 05/01/2022 CLINICAL DATA:  Mental status change, unknown cause EXAM: CT HEAD WITHOUT CONTRAST TECHNIQUE: Contiguous axial images were obtained from the base of the skull through the vertex without intravenous contrast. RADIATION DOSE REDUCTION: This exam was performed according to the departmental dose-optimization program which includes automated exposure control, adjustment of the mA and/or kV according to patient size and/or use of iterative reconstruction technique. COMPARISON:  CT head 10/21/2015 BRAIN: BRAIN Similar-appearing right posterior porch ventriculoperitoneal shunt with tip again noted to terminate within the inferior horn of the right lateral ventricle. Right frontotemporal encephalomalacia. No evidence of large-territorial acute infarction. No parenchymal hemorrhage. No mass lesion. No extra-axial collection. No mass effect or midline shift. Similar appearance of the lateral ventricles and third ventricle. Basilar cisterns are patent. Vascular: No hyperdense vessel. Vascular clipping again noted at the central skull base. Skull: No acute fracture or focal lesion.  Right frontal craniotomy. Sinuses/Orbits: Paranasal sinuses and mastoid air cells are clear. The orbits are unremarkable. Other: None. IMPRESSION: No acute intracranial abnormality in a patient with known right posterior approach ventriculoperitoneal shunt. Stable size of lateral and third ventricles compared to 2016. Electronically Signed   By: Iven Finn M.D.   On: 05/01/2022 21:33   DG Chest Portable 1 View  Result Date: 05/01/2022 CLINICAL DATA:  Altered mental status EXAM: PORTABLE CHEST 1  VIEW COMPARISON:  08/29/2018 FINDINGS: Mild cardiomegaly. Small hiatal hernia. No focal airspace consolidation, pleural effusion, or pneumothorax. Right-sided VP shunt catheter with similar degree of irregularity of the catheter contours within the chest. Possible catheter discontinuity the level of the right sixth rib. IMPRESSION: 1. No acute cardiopulmonary disease. 2. Small hiatal hernia. 3. Possible VP shunt catheter discontinuity within the mid chest. Electronically Signed   By: Davina Poke D.O.   On: 05/01/2022 21:22      IMPRESSION:     FOBT + anemia.  Started iron early march 2023 for IDA.  Previous IDA w cameron's lesions (  not bleeding) at EGD 2015.  Stopped po iron in past due to contipation. Home meds include Mobic daily, ibuprofen 1200 mg daily Pepcid 40 mg daily    Chronic constipation in setting of chronic narcotics.  Meds include softeners, lactulose, Linzess.      Elevated transaminases.  ALT > AST.  No liver imaging in chart.     CKD.  Stage 3b to 4.  This certainly contributes to anemia.  PLAN:     EGD this morning.  Patient agreeable to proceed.  patient has been n.p.o.  ? Ultrasound liver in AM?   Might benefit from infusions of parenteral iron.   Azucena Freed  05/02/2022, 8:51 AM Phone [phone]

## 2022-05-02 NOTE — Progress Notes (Signed)
Transferring facility: [facility] Requesting provider: Dr. Lockie Mola (EDP at [facility]) Reason for transfer: admission for further evaluation and management of suspected acute (vs subacute) upper GI bleed complicated by symptomatic acute on chronic anemia.  56 year old female with medical history notable for chronic anemia (baseline Hgb 9.5 -10), who presented to [facility] ED on 05/01/2022 complaining of generalized weakness for at least the last week, without any associated acute focal weakness.  She hasx also noted some dizziness with standing over the last few days, resulting in a single ground-level fall without loss of consciousness earlier today. She DID hit her head as a result of this fall.  Not on any blood thinners.   She has a history of chronic anemia, with hemoglobin of 9.6 in October 2022, 9.9 in March 2023, 9.9 on 04/15/2022.  She was started on daily oral iron supplementation as an outpatient a few weeks ago, but has noted persistence of her generalized weakness in spite of this, prompting her to present to [facility] today for further evaluation.  Denies any regular or recent alcohol consumption, no known history of underlying liver disease, no reported NSAID use.  She has not noted any recent abdominal pain, melena, hematochezia.  No hematemesis.  No chest pain or shortness of breath.  Vital signs in the ED were notable for the following: Afebrile; heart rate in the 80s; blood pressure 107/59; respiratory rate 18, oxygen saturation 99 to 100% on room air.  Labs were notable for CBC, which showed hemoglobin 6.8.  CMP notable for the following: BUN 53 compared to 55 on 04/15/22 and 20 on 03/09/2022.   DRE reportedly showed borderline dark appearing stool without gross blood.  Fecal occult blood test result currently pending.  Imaging notable for CT head, which reportedly showed no evidence of acute intracranial process, including no evidence of acute intracranial bleed.  CT head reportedly showed  evidence of VP shunt, and EDP conveyed that he spoke with radiology who confirmed that this is a known VP shunt, without any acute changes noted in the associated size of the lateral/third ventricles in the interval since 2016.  Medications administered prior to transfer included the following: Protonix drip.   Of note, [facility] does not have blood products available to initiate transfusion at this time.   EDP initially discussed patient's case with the on-call [facility] gastroenterologist, Dr. Marca Ancona, who conveyed that the patient had been previously seen by [facility] gastroenterology, and requested that [facility] be contacted instead.  EDP subsequently contacted on-call [place] gastro[facility]ology, Dr. Lavon Paganini, Who is on-call at [facility] and will evaluate/consult at [facility].   Subsequently, I accepted this patient for transfer for inpatient admission to a PCU bed at [facility] for further work-up and management of suspected acute vs subacute upper GI bleed complicated by symptomatic acute on chronic anemia.  A subacute timeframe is suspected given timing of elevation of BUN, as established above.       Check [email or web address] for on-call coverage.   Nursing staff, Please call [facility] Admits & Consults System-Wide number on Amion as soon as patient's arrival, so appropriate admitting provider can evaluate the pt.     Newton Pigg, DO Hospitalist

## 2022-05-02 NOTE — ED Notes (Signed)
Attempted IV x 2 without success. Patient tolerated well. 

## 2022-05-02 NOTE — Plan of Care (Signed)
  Problem: Education: Goal: Knowledge of General Education information will improve Description: Including pain rating scale, medication(s)/side effects and non-pharmacologic comfort measures Outcome: Progressing   Problem: Nutrition: Goal: Adequate nutrition will be maintained Outcome: Progressing   Problem: Safety: Goal: Ability to remain free from injury will improve Outcome: Progressing   

## 2022-05-02 NOTE — ED Notes (Signed)
MD Opyd notified of critical hgb

## 2022-05-02 NOTE — ED Notes (Signed)
Pt in room A&O x4 Updated on plan of care. Ice chips at bedside Pt assisted to bedside commode without difficulty. Pt reports some feeling of lightheadedness with movement.

## 2022-05-02 NOTE — Interval H&P Note (Signed)
History and Physical Interval Note:  05/02/2022 12:48 PM  [name]  has presented today for surgery, with the diagnosis of Melenic stools.  Acute on chronic iron deficiency anemia..  The various methods of treatment have been discussed with the patient and family. After consideration of risks, benefits and other options for treatment, the patient has consented to  Procedure(s): ESOPHAGOGASTRODUODENOSCOPY (EGD) WITH PROPOFOL (N/A) as a surgical intervention.  The patient's history has been reviewed, patient examined, no change in status, stable for surgery.  I have reviewed the patient's chart and labs.  Questions were answered to the patient's satisfaction.     Samantha [name]

## 2022-05-02 NOTE — Progress Notes (Signed)
Patient placed on CPAP via FFM 5 cmH2O.  Patient is toleratig at this time. RT will continue to monitor.

## 2022-05-02 NOTE — ED Notes (Signed)
Signed consent for blood at bedside  Dr Antionette Char at bedside

## 2022-05-02 NOTE — Anesthesia Procedure Notes (Signed)
Procedure Name: MAC Date/Time: 05/02/2022 1:09 PM  Performed by: Leonor Liv, CRNAPre-anesthesia Checklist: Patient identified, Emergency Drugs available, Suction available, Patient being monitored and Timeout performed Patient Re-evaluated:Patient Re-evaluated prior to induction Oxygen Delivery Method: Nasal cannula Airway Equipment and Method: Bite block Dental Injury: Teeth and Oropharynx as per pre-operative assessment

## 2022-05-02 NOTE — Op Note (Signed)
[facility] Patient Name: Samantha [name] Procedure Date : 05/02/2022 MRN: [id number] Attending MD: Georgian Co ,  Date of Birth: 1965-12-04 CSN: [id number] Age: 56 Admit Type: Outpatient Procedure:                Upper GI endoscopy Indications:              Anemia, black stools Providers:                Adline Mango" Lady Deutscher, RN, Luan Moore, Technician, Trixie Deis, CRNA Referring MD:             Hospitalist team Medicines:                Monitored Anesthesia Care Complications:            No immediate complications. Estimated Blood Loss:     Estimated blood loss was minimal. Procedure:                Pre-Anesthesia Assessment:                           - Prior to the procedure, a History and Physical                            was performed, and patient medications and                            allergies were reviewed. The patient's tolerance of                            previous anesthesia was also reviewed. The risks                            and benefits of the procedure and the sedation                            options and risks were discussed with the patient.                            All questions were answered, and informed consent                            was obtained. Prior Anticoagulants: The patient has                            taken no previous anticoagulant or antiplatelet                            agents. ASA Grade Assessment: III - A patient with                            severe systemic disease. After reviewing the risks  and benefits, the patient was deemed in                            satisfactory condition to undergo the procedure.                           After obtaining informed consent, the endoscope was                            passed under direct vision. Throughout the                            procedure, the patient's blood pressure, pulse, and                             oxygen saturations were monitored continuously. The                            GIF-H190 ([id number]) Olympus endoscope was introduced                            through the mouth, and advanced to the second part                            of duodenum. The upper GI endoscopy was                            accomplished without difficulty. The patient                            tolerated the procedure well. Scope In: Scope Out: Findings:      Two cratered esophageal ulcers were found in the distal esophagus. The       largest lesion was 4 mm in largest dimension.      One benign-appearing, intrinsic moderate (circumferential scarring or       stenosis; an endoscope may pass) stenosis was found at the       gastroesophageal junction. The stenosis was traversed.      A hiatal hernia was present.      Many non-bleeding cratered gastric erosions/ulcers with pigmented       material were found in the cardia, consistent with Cameron lesions. The       largest lesion was 14 mm in largest dimension.      Localized mildly erythematous mucosa without bleeding was found in the       gastric antrum. Biopsies were taken with a cold forceps for histology.      The examined duodenum was normal. Biopsies were taken with a cold       forceps for histology. Impression:               - Esophageal ulcers.                           - Benign-appearing esophageal stenosis.                           -  Hiatal hernia.                           - Non-bleeding gastric ulcers (Cameron lesions)                            with pigmented material.                           - Erythematous mucosa in the antrum. Biopsied.                           - Normal examined duodenum. Biopsied. Recommendation:           - Return patient to hospital ward for ongoing care.                           - It is suspected that the patient's anemia and                            black stools is due to ulcers in the  stomach and                            esophagus.                           - Use a proton pump inhibitor PO BID for 3 months.                           - Use sucralfate suspension 1 gram PO QID for 4                            weeks.                           - Avoid NSAIDs if possible.                           - Repeat upper endoscopy in 2-3 months to check                            healing of esophageal ulcers and Cameron lesions.                           - Await pathology results.                           - We will arrange for outpatient GI follow up in                            2-3 weeks. Can consider colonoscopy as an                            outpatient at that time if patient's blood counts  are not responding to the above therapy.                           - The findings and recommendations were discussed                            with the patient and/or primary team. Procedure Code(s):        --- Professional ---                           [id number], Esophagogastroduodenoscopy, flexible,                            transoral; with biopsy, single or multiple Diagnosis Code(s):        --- Professional ---                           K22.10, Ulcer of esophagus without bleeding                           K44.9, Diaphragmatic hernia without obstruction or                            gangrene                           K25.9, Gastric ulcer, unspecified as acute or                            chronic, without hemorrhage or perforation                           K31.89, Other diseases of stomach and duodenum                           D64.9, Anemia, unspecified CPT copyright 2019 American Medical Association. All rights reserved. The codes documented in this report are preliminary and upon coder review may  be revised to meet current compliance requirements. Nicole Kindred "Eulah Pont,  05/02/2022 1:43:30 PM Number of Addenda: 0

## 2022-05-02 NOTE — Anesthesia Postprocedure Evaluation (Signed)
Anesthesia Post Note  Patient: Samantha [name]  Procedure(s) Performed: ESOPHAGOGASTRODUODENOSCOPY (EGD) WITH PROPOFOL BIOPSY     Patient location during evaluation: PACU Anesthesia Type: MAC Level of consciousness: awake Pain management: pain level controlled Vital Signs Assessment: post-procedure vital signs reviewed and stable Respiratory status: spontaneous breathing Cardiovascular status: stable Postop Assessment: no apparent nausea or vomiting Anesthetic complications: no   No notable events documented.  Last Vitals:  Vitals:   05/02/22 1346 05/02/22 1356  BP: (!) 97/50 (!) 105/50  Pulse: 77 73  Resp: 15 12  Temp:    SpO2: 95% 99%    Last Pain:  Vitals:   05/02/22 1356  TempSrc:   PainSc: 0-No pain                 Huston Foley

## 2022-05-02 NOTE — H&P (View-Only) (Signed)
Samantha [name]: 8:51 AM 05/02/2022  LOS: 0 days    Referring Provider: Dr Samantha [name]  Primary Care Physician:  Samantha Anger, [name] Primary Gastroenterologist:  Samantha [name].  Last OV 2016.       Reason for Consultation:  [place] anemia.  Tarry stools, FOBT +   HPI: Samantha [name] is a 56 y.o. female.  PMH chronic pain, neck/back/hips, chronic narcotics.  Mild dementia.  Obstructive hydrocephalus, sp VP shunt.  GERD.  Constipation.  CKD stage 3b to 4, GFR 21 3 weeks ago.   10/2014 EGD, for eval IDA. Nonobstructing Schatzki's ring at 30 cm, a 4 cm hiatal hernia and several nonbleeding Cameron's erosions. Stomach otherwise normal. Duodenum normal, SB biopsies negative for celiac disease.  10/2014 Colonoscopy: normal.    Restarted po iron early March (Hgb 9.9, normal MCV. Ferritin 3, iron 26).  Eval of confusion at home at OV a few weeks ago (using CBD Delta 8 which she has now discontinued). Med list includes Mobic daily, ibuprofen 400 mg tid,  MS contin, Oxycodone, 81 ASA, Linzess, stool softener, lactulose for constipation, pepcid.   Presented to [facility] after fall, hitting head at home.  Possible light headedness/dizziness.  No LOC. C/O head, neck pain.  Describes tarry, black stools for about 2 or 3 months.  Generally has bowel movements daily.  Endorses decreased appetite without weight loss.  No nausea, vomiting, dysphagia, bleeding per rectum.  No excessive bleeding or bruising.   Hgb 9.6 in 08/2021.  Currently it is 6.  MCV 96.  Anemia panel pndg.   Received 1 of 1 PRBCs this morning.  Started on PPI drip.  GFR 28.. 35 overnight.   T bili, alk phos normal.  AST/ALT 39/92.. 17/15; 85/104 a month ago.       INR 1.   CT head, neck: nothing acute.   Stool FOBT positive.    Maternal grandmother died in  her 56s with esophageal cancer.  Her mother died at 8 from Alzheimer's and had cholecystectomy.  Father died at 83 with a massive MI. Lives at home with her husband.  Does not smoke.  Does not drink alcohol.  Past Medical History:  Diagnosis Date   Anemia    Anxiety    Cerebral ventricular shunt fitting or adjustment    COMMON MIGRAINE 10/01/2007   Chronic     Depression    GERD (gastroesophageal reflux disease)    GLUCOSE INTOLERANCE 10/01/2007   Qualifier: Diagnosis of  By: Samantha [name], Samantha [name]    Hiatal hernia    Hydrocephalus (Samantha [name])    Hyperlipidemia    HYPERLIPIDEMIA 10/01/2007   Chronic. Lovaza is too $$$ Declined statins due to worries    IDA (iron deficiency anemia)    Memory loss    Migraine    OBSTRUCTIVE SLEEP APNEA 10/01/2007   NPSG 2006:  AHI 9/hr Started cpap 2009 successfully.      Paresthesia    Schatzki's ring    Sleep apnea    Stroke [facility]) 03/2013    Past Surgical  History:  Procedure Laterality Date   [place] Shunt  1976,   cerebral vascular shunt/ with a revision [place]     Pituitary cyst w/subsequent shunt      Prior to Admission medications   Medication Sig Start Date End Date Taking? Authorizing Provider  aspirin 81 MG tablet Take 81 mg by mouth daily.    Provider, Historical, [name]  atorvastatin (LIPITOR) 20 MG tablet TAKE 1 TABLET EVERY DAY AT 6 PM 06/17/21   Plotnikov, Evie Lacks, [name]  busPIRone (BUSPAR) 10 MG tablet TAKE 1 TABLET THREE TIMES DAILY 02/23/22   Plotnikov, Evie Lacks, [name]  Calcium Carbonate-Vitamin D (CALCIUM-VITAMIN D) 500-200 MG-UNIT per tablet Take 1 tablet by mouth 2 (two) times daily with a meal.    Provider, Historical, [name]  Docusate Calcium (STOOL SOFTENER PO) Take by mouth 2 (two) times daily.    Provider, Historical, [name]  donepezil (ARICEPT) 10 MG tablet TAKE 1 TABLET AT BEDTIME 03/31/22   Plotnikov, Evie Lacks, [name]  DULoxetine (CYMBALTA) 60 MG capsule TAKE 1 CAPSULE EVERY DAY 12/23/21   Plotnikov,  Evie Lacks, [name]  famotidine (PEPCID) 40 MG tablet TAKE 1 TABLET EVERY DAY 12/23/21   Plotnikov, Evie Lacks, [name]  ferrous sulfate 325 (65 FE) MG tablet Take 1 tablet (325 mg total) by mouth daily. 01/16/22 01/16/23  Plotnikov, Evie Lacks, [name]  gabapentin (NEURONTIN) 300 MG capsule Take 300 mg by mouth 3 (three) times daily.    Provider, Historical, [name]  lactulose (CHRONULAC) 10 GM/15ML solution Take 30-45 mLs (20-30 g total) by mouth 2 (two) times daily as needed for moderate constipation or severe constipation. 07/18/18   Plotnikov, Evie Lacks, [name]  lamoTRIgine (LAMICTAL) 100 MG tablet TAKE 1 TABLET EVERY MORNING, AND 1 AND 1/2 TABLETS AT NIGHT. 01/14/22   Plotnikov, Evie Lacks, [name]  levofloxacin (LEVAQUIN) 500 MG tablet Take 1 tablet (500 mg total) by mouth daily. 04/15/22   Plotnikov, Evie Lacks, [name]  lidocaine (LIDODERM) 5 % Place 1 patch onto the skin daily. Remove & Discard patch within 12 hours or as directed by [name] 01/14/19   Plotnikov, Evie Lacks, [name]  LINZESS 290 MCG CAPS capsule TAKE 1 CAPSULE  DAILY BEFORE BREAKFAST 02/02/22   Plotnikov, Evie Lacks, [name]  meloxicam (MOBIC) 15 MG tablet TAKE 1/2 TO 1 TABLET BY MOUTH DAILY AS NEEDED FOR PAIN 12/27/21   Plotnikov, Evie Lacks, [name]  memantine (NAMENDA) 5 MG tablet Take 1 tablet (5 mg total) by mouth 2 (two) times daily. 09/02/21   Plotnikov, Evie Lacks, [name]  morphine (MS CONTIN) 30 MG 12 hr tablet Take 1 tablet (30 mg total) by mouth every 12 (twelve) hours. 04/28/22   Plotnikov, Evie Lacks, [name]  naloxone [facility]) nasal spray 4 mg/0.1 mL SMARTSIG:Both Nares 04/29/22   Provider, Historical, [name]  olmesartan (BENICAR) 20 MG tablet Take 1 tablet (20 mg total) by mouth daily. 03/09/22 03/09/23  Plotnikov, Evie Lacks, [name]  Oxycodone HCl 10 MG TABS Take 1 tablet (10 mg total) by mouth 3 (three) times daily as needed. 04/28/22   Plotnikov, Evie Lacks, [name]  Probiotic Product (ALIGN) 4 MG CAPS Take 1 capsule (4 mg total) by mouth daily. 01/23/20   Plotnikov, Evie Lacks, [name]  promethazine (PHENERGAN) 12.5 MG  tablet Take 1 tablet (12.5 mg total) by mouth every 6 (six) hours as needed for nausea or vomiting. 04/28/22   Plotnikov, Evie Lacks, [name]    Scheduled Meds:  sodium chloride  Intravenous Once   sodium chloride   Intravenous Once   morphine  15 mg Oral Q24H   pantoprazole       [START ON 05/05/2022] pantoprazole  40 mg Intravenous Q12H   sodium chloride flush  3 mL Intravenous Q12H   Infusions:  pantoprazole 8 mg/hr (05/02/22 0515)   PRN Meds: acetaminophen **OR** acetaminophen, oxyCODONE, pantoprazole   Allergies as of 05/01/2022 - Review Complete 05/01/2022  Allergen Reaction Noted   Alprazolam Other (See Comments) 09/20/2019   Citalopram hydrobromide  06/29/2010    Family History  Problem Relation Age of Onset   Dementia Mother    Kidney cancer Mother    Alzheimer's disease Mother    Migraines Mother    Colon polyps Mother    Heart disease Father    Diabetes Father    Melanoma Other    Lung cancer Other    Lung cancer Other    Brain cancer Maternal Aunt    Lung cancer Maternal Uncle    Esophageal cancer Maternal Grandmother    Alzheimer's disease Paternal Grandmother    Colon cancer Neg Hx     Social History   Socioeconomic History   Marital status: Married    Spouse name: Elta Guadeloupe   Number of children: 0   Years of education: 12   Highest education level: Not on file  Occupational History   Occupation: [employer]    Comment: Therapist, art Rep   Occupation: disabled  Tobacco Use   Smoking status: Never   Smokeless tobacco: Never  Substance and Sexual Activity   Alcohol use: Yes    Alcohol/week: 1.0 standard drink of alcohol    Types: 1 Glasses of wine per week    Comment: Social   Drug use: No   Sexual activity: Yes  Other Topics Concern   Not on file  Social History Narrative   Mother is in a NH.    Patient lives at home with her husband Elta Guadeloupe). 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    High school education.   Right handed.   Caffeine 1 cup  daily.   She has no children   Social Determinants of Health   Financial Resource Strain: Low Risk  (09/02/2021)   Overall Financial Resource Strain (CARDIA)    Difficulty of Paying Living Expenses: Not hard at all  Food Insecurity: No Food Insecurity (09/02/2021)   Hunger Vital Sign    Worried About Running Out of Food in the Last Year: Never true    Ran Out of Food in the Last Year: Never true  Transportation Needs: No Transportation Needs (09/02/2021)   PRAPARE - Hydrologist (Medical): No    Lack of Transportation (Non-Medical): No  Physical Activity: Inactive (09/02/2021)   Exercise Vital Sign    Days of Exercise per Week: 0 days    Minutes of Exercise per Session: 0 min  Stress: No Stress Concern Present (09/02/2021)   [place]    Feeling of Stress : Not at all  Social Connections: [place] (09/02/2021)   Social Connection and Isolation Panel [NHANES]    Frequency of Communication with Friends and Family: More than three times a week    Frequency of Social Gatherings with Friends and Family: More than three times a week    Attends Religious Services: 1 to 4 times per year    Active Member of [employer] or Organizations: Yes  Attends Archivist Meetings: 1 to 4 times per year    Marital Status: Married  Human resources officer Violence: Not At Risk (09/02/2021)   Humiliation, Afraid, Rape, and Kick questionnaire    Fear of Current or Ex-Partner: No    Emotionally Abused: No    Physically Abused: No    Sexually Abused: No    REVIEW OF SYSTEMS: Constitutional: No profound weakness or fatigue. ENT:  No nose bleeds Pulm: No shortness of breath or cough. CV:  No palpitations, no LE edema.  No angina. GU:  No hematuria, no frequency GI: See HPI. Heme: Per HPI. Transfusions: 1 unit PRBCs thus far.  No previous blood transfusions. Neuro:  No headaches, no peripheral  tingling or numbness.  No seizures, no syncope. Derm:  No itching, no rash or sores.  Endocrine:  No sweats or chills.  No polyuria or dysuria Immunization: Reviewed. Travel: Not queried.   PHYSICAL EXAM: Vital signs in last 24 hours: Vitals:   05/02/22 0656 05/02/22 0700  BP:  121/85  Pulse: 79 81  Resp: 18 15  Temp:  98.6 F (37 C)  SpO2: 100% 100%   Wt Readings from Last 3 Encounters:  05/01/22 68.5 kg  04/28/22 67.6 kg  04/15/22 69.9 kg    General: Patient is a bit pale but looks otherwise well.  She is overweight.  Resting comfortably on the bed.  Calm, alert Head: No facial asymmetry or swelling.  No signs of head trauma. Eyes: Conjunctiva slightly pale. Ears: Obvious hearing deficit. Nose: No congestion or discharge Mouth: Mucous membranes moist, pink, clear.  Tongue midline.  Upper full dentures in place. Neck: No JVD, no masses, no thyromegaly. Lungs: Clear bilaterally with excellent breath sounds. Heart: RRR.  No MRG.  S1, S2 present Abdomen: Soft.  Not distended.  Active bowel sounds.  No HSM, masses, bruits, hernias..   Rectal: Rectal exam not repeated.  Stool submitted to lab tested FOBT positive. Musc/Skeltl: Slight to spinal scoliosis.  No obvious joint redness or swelling. Extremities: No CCE. Neurologic: Oriented x3.  Moves all 4 limbs without tremor, strength not tested.  At times difficulty focusing on questioning, answering questions but for the most part accurate answers to questions. Skin: No rash, no sores, no suspicious lesions. Nodes: No cervical adenopathy Psych: Calm, cooperative, pleasant.  Intake/Output from previous day: 06/18 0701 - 06/19 0700 In: 28.5 [I.V.:28.5] Out: -  Intake/Output this shift: No intake/output data recorded.  LAB RESULTS: Recent Labs    05/01/22 2145 05/02/22 0542  WBC 9.6  --   HGB 6.8* 6.0*  HCT 21.4* 18.6*  PLT 458*  --    BMET Lab Results  Component Value Date   NA 135 05/02/2022   NA 132 (L)  05/01/2022   NA 136 04/15/2022   K 4.1 05/02/2022   K 4.1 05/01/2022   K 4.5 04/15/2022   CL 107 05/02/2022   CL 106 05/01/2022   CL 103 04/15/2022   CO2 18 (L) 05/02/2022   CO2 19 (L) 05/01/2022   CO2 24 04/15/2022   GLUCOSE 95 05/02/2022   GLUCOSE 99 05/01/2022   GLUCOSE 108 (H) 04/15/2022   BUN 44 (H) 05/02/2022   BUN 53 (H) 05/01/2022   BUN 55 (H) 04/15/2022   CREATININE 1.69 (H) 05/02/2022   CREATININE 2.03 (H) 05/01/2022   CREATININE 2.46 (H) 04/15/2022   CALCIUM 8.6 (L) 05/02/2022   CALCIUM 8.5 (L) 05/01/2022   CALCIUM 9.2 04/15/2022   LFT Recent Labs  05/01/22 2145  PROT 5.7*  ALBUMIN 3.2*  AST 17  ALT 15  ALKPHOS 44  BILITOT 0.5   PT/INR Lab Results  Component Value Date   INR 1.0 05/02/2022   Hepatitis Panel No results for input(s): "HEPBSAG", "HCVAB", "HEPAIGM", "HEPBIGM" in the last 72 hours. C-Diff No components found for: "CDIFF" Lipase     Component Value Date/Time   LIPASE 41 07/12/2015 0230    Drugs of Abuse  No results found for: "LABOPIA", "COCAINSCRNUR", "LABBENZ", "AMPHETMU", "THCU", "LABBARB"   RADIOLOGY STUDIES: CT Cervical Spine Wo Contrast  Result Date: 05/01/2022 CLINICAL DATA:  Neck trauma, midline tenderness (Age 65-64y) fall EXAM: CT CERVICAL SPINE WITHOUT CONTRAST TECHNIQUE: Multidetector CT imaging of the cervical spine was performed without intravenous contrast. Multiplanar CT image reconstructions were also generated. RADIATION DOSE REDUCTION: This exam was performed according to the departmental dose-optimization program which includes automated exposure control, adjustment of the mA and/or kV according to patient size and/or use of iterative reconstruction technique. COMPARISON:  MRI cervical spine 03/02/2013 FINDINGS: Alignment: Normal. Skull base and vertebrae: Prominent left C5 transverse foramina likely due to a tortuous vertebral artery. No acute fracture. No aggressive appearing focal osseous lesion or focal pathologic  process. Soft tissues and spinal canal: No prevertebral fluid or swelling. No visible canal hematoma. Upper chest: Unremarkable. Other: None. IMPRESSION: No acute displaced fracture or traumatic listhesis of the cervical spine. Electronically Signed   By: Iven Finn M.D.   On: 05/01/2022 22:21   CT Head Wo Contrast  Result Date: 05/01/2022 CLINICAL DATA:  Mental status change, unknown cause EXAM: CT HEAD WITHOUT CONTRAST TECHNIQUE: Contiguous axial images were obtained from the base of the skull through the vertex without intravenous contrast. RADIATION DOSE REDUCTION: This exam was performed according to the departmental dose-optimization program which includes automated exposure control, adjustment of the mA and/or kV according to patient size and/or use of iterative reconstruction technique. COMPARISON:  CT head 10/21/2015 BRAIN: BRAIN Similar-appearing right posterior porch ventriculoperitoneal shunt with tip again noted to terminate within the inferior horn of the right lateral ventricle. Right frontotemporal encephalomalacia. No evidence of large-territorial acute infarction. No parenchymal hemorrhage. No mass lesion. No extra-axial collection. No mass effect or midline shift. Similar appearance of the lateral ventricles and third ventricle. Basilar cisterns are patent. Vascular: No hyperdense vessel. Vascular clipping again noted at the central skull base. Skull: No acute fracture or focal lesion.  Right frontal craniotomy. Sinuses/Orbits: Paranasal sinuses and mastoid air cells are clear. The orbits are unremarkable. Other: None. IMPRESSION: No acute intracranial abnormality in a patient with known right posterior approach ventriculoperitoneal shunt. Stable size of lateral and third ventricles compared to 2016. Electronically Signed   By: Iven Finn M.D.   On: 05/01/2022 21:33   DG Chest Portable 1 View  Result Date: 05/01/2022 CLINICAL DATA:  Altered mental status EXAM: PORTABLE CHEST 1  VIEW COMPARISON:  08/29/2018 FINDINGS: Mild cardiomegaly. Small hiatal hernia. No focal airspace consolidation, pleural effusion, or pneumothorax. Right-sided VP shunt catheter with similar degree of irregularity of the catheter contours within the chest. Possible catheter discontinuity the level of the right sixth rib. IMPRESSION: 1. No acute cardiopulmonary disease. 2. Small hiatal hernia. 3. Possible VP shunt catheter discontinuity within the mid chest. Electronically Signed   By: Davina Poke D.O.   On: 05/01/2022 21:22      IMPRESSION:     FOBT + anemia.  Started iron early march 2023 for IDA.  Previous IDA w cameron's lesions (  not bleeding) at EGD 2015.  Stopped po iron in past due to contipation. Home meds include Mobic daily, ibuprofen 1200 mg daily Pepcid 40 mg daily    Chronic constipation in setting of chronic narcotics.  Meds include softeners, lactulose, Linzess.      Elevated transaminases.  ALT > AST.  No liver imaging in chart.     CKD.  Stage 3b to 4.  This certainly contributes to anemia.  PLAN:     EGD this morning.  Patient agreeable to proceed.  patient has been n.p.o.  ? Ultrasound liver in AM?   Might benefit from infusions of parenteral iron.   Azucena Freed  05/02/2022, 8:51 AM Phone [phone]

## 2022-05-02 NOTE — Anesthesia Preprocedure Evaluation (Signed)
Anesthesia Evaluation  Patient identified by MRN, date of birth, ID band Patient awake    Reviewed: Allergy & Precautions, NPO status , Patient's Chart, lab work & pertinent test results  Airway Mallampati: I       Dental no notable dental hx.    Pulmonary sleep apnea and Continuous Positive Airway Pressure Ventilation ,    Pulmonary exam normal        Cardiovascular hypertension, Pt. on medications Normal cardiovascular exam Rhythm:Regular Rate:Normal     Neuro/Psych  Headaches,    GI/Hepatic Neg liver ROS, GERD  Medicated,  Endo/Other    Renal/GU Renal diseaseCKD III     Musculoskeletal   Abdominal (+) + obese,   Peds  Hematology  (+) Blood dyscrasia, anemia ,   Anesthesia Other Findings   Reproductive/Obstetrics                             Anesthesia Physical Anesthesia Plan  ASA: 2  Anesthesia Plan: MAC   Post-op Pain Management: Minimal or no pain anticipated   Induction: Intravenous  PONV Risk Score and Plan: Propofol infusion and TIVA  Airway Management Planned: Natural Airway and Mask  Additional Equipment: None  Intra-op Plan:   Post-operative Plan:   Informed Consent: I have reviewed the patients History and Physical, chart, labs and discussed the procedure including the risks, benefits and alternatives for the proposed anesthesia with the patient or authorized representative who has indicated his/her understanding and acceptance.     Dental advisory given  Plan Discussed with: CRNA  Anesthesia Plan Comments:         Anesthesia Quick Evaluation

## 2022-05-02 NOTE — Transfer of Care (Signed)
Immediate Anesthesia Transfer of Care Note  Patient: Samantha [name]  Procedure(s) Performed: ESOPHAGOGASTRODUODENOSCOPY (EGD) WITH PROPOFOL BIOPSY  Patient Location: Endoscopy Unit  Anesthesia Type:MAC  Level of Consciousness: awake, alert  and oriented  Airway & Oxygen Therapy: Patient Spontanous Breathing and Patient connected to nasal cannula oxygen  Post-op Assessment: Report given to RN and Post -op Vital signs reviewed and stable  Post vital signs: Reviewed and stable  Last Vitals:  Vitals Value Taken Time  BP 95/61 05/02/22 1336  Temp 36.3 C 05/02/22 1336  Pulse 83 05/02/22 1337  Resp 25 05/02/22 1337  SpO2 98 % 05/02/22 1337  Vitals shown include unvalidated device data.  Last Pain:  Vitals:   05/02/22 1336  TempSrc: Temporal  PainSc: 0-No pain         Complications: No notable events documented.

## 2022-05-02 NOTE — ED Notes (Signed)
Patient assisted on an off bsc.

## 2022-05-02 NOTE — ED Notes (Signed)
Pt tx via [facility] from [facility]. Pt presented with weakness and confusion today. Pt has hx of BP shunt and stroke in 2014. Pt reports only deficit from stroke is "bad memory". Hgb is 6.8 - pt does not know baseline. BP has been stable. Carelink reports HR has been 80s-120s-130s. Pt has some dizziness with standing. Pt is Aox4, no pain currently

## 2022-05-03 DIAGNOSIS — D649 Anemia, unspecified: Secondary | ICD-10-CM | POA: Diagnosis not present

## 2022-05-03 DIAGNOSIS — K254 Chronic or unspecified gastric ulcer with hemorrhage: Secondary | ICD-10-CM

## 2022-05-03 DIAGNOSIS — F4323 Adjustment disorder with mixed anxiety and depressed mood: Secondary | ICD-10-CM | POA: Diagnosis not present

## 2022-05-03 DIAGNOSIS — G9340 Encephalopathy, unspecified: Secondary | ICD-10-CM | POA: Diagnosis not present

## 2022-05-03 DIAGNOSIS — K253 Acute gastric ulcer without hemorrhage or perforation: Secondary | ICD-10-CM

## 2022-05-03 DIAGNOSIS — K2210 Ulcer of esophagus without bleeding: Secondary | ICD-10-CM

## 2022-05-03 DIAGNOSIS — K2211 Ulcer of esophagus with bleeding: Secondary | ICD-10-CM

## 2022-05-03 DIAGNOSIS — N179 Acute kidney failure, unspecified: Secondary | ICD-10-CM | POA: Diagnosis not present

## 2022-05-03 LAB — TYPE AND SCREEN
ABO/RH(D): A POS
Antibody Screen: NEGATIVE
Unit division: 0

## 2022-05-03 LAB — BASIC METABOLIC PANEL
Anion gap: 9 (ref 5–15)
BUN: 19 mg/dL (ref 6–20)
CO2: 19 mmol/L — ABNORMAL LOW (ref 22–32)
Calcium: 8.4 mg/dL — ABNORMAL LOW (ref 8.9–10.3)
Chloride: 110 mmol/L (ref 98–111)
Creatinine, Ser: 0.83 mg/dL (ref 0.44–1.00)
GFR, Estimated: >60 mL/min (ref >60)
Glucose, Bld: 83 mg/dL (ref 70–99)
Potassium: 4.2 mmol/L (ref 3.5–5.1)
Sodium: 138 mmol/L (ref 135–145)

## 2022-05-03 LAB — BPAM RBC
Blood Product Expiration Date: 202307062359
ISSUE DATE / TIME: 202306190626
Unit Type and Rh: 6200

## 2022-05-03 LAB — SURGICAL PATHOLOGY

## 2022-05-03 MED ORDER — NALOXONE HCL 0.4 MG/ML IJ SOLN: 0.4000 mg | INTRAMUSCULAR | Status: DC | PRN

## 2022-05-03 MED ORDER — SUCRALFATE 1 G PO TABS: 1.0000 g | ORAL_TABLET | Freq: Four times a day (QID) | ORAL | 0 refills | Status: DC

## 2022-05-03 MED ORDER — PANTOPRAZOLE SODIUM 40 MG PO TBEC: 40.0000 mg | DELAYED_RELEASE_TABLET | Freq: Two times a day (BID) | ORAL | 0 refills | Status: DC

## 2022-05-03 NOTE — Progress Notes (Signed)
Patient discharged.Discharge instruction given.Patient verbalised understanding.Oriented times 4.Patient's friend will transfer her .Phone and charger taken.No clothes at bedside.Patient's friend will bring her clothes.

## 2022-05-03 NOTE — Progress Notes (Signed)
[facility] night cross cover note:   I was notified by RN of the patient's complaint of nausea, while noting no current order for as needed antiemetic.  Most recent EKG, performed on 05/01/2022, showed prolonged QTc of .  Consequently, I placed order for prn IV Ativan for nausea, and have ordered repeat EKG for the morning to further trend degree of QTc prolongation.    UPDATE: RN also notified me of the patient's request for resumption of her home opioid regimen in the setting of a history of chronic pain syndrome.  Specifically, the patient reports that as an outpatient she is on MS Contin 30 mg p.o. twice daily scheduled in addition to scheduled oxycodone IR 10 mg p.o. 3 times daily.   VSS, with most recent systolic blood pressures noted to be in the range of 120s to 140s mmHg.   I noted her existing order for oxycodone IR 5 mg p.o. 3 times daily as needed.  I did not modify this order, but did resume her home scheduled long-acting MS Contin.  Prn IV Narcan also ordered.  Additionally, the patient requested resumption of her home BuSpar, which I also resumed for.    Newton Pigg, DO Hospitalist

## 2022-05-03 NOTE — Discharge Summary (Addendum)
Physician Discharge Summary   Patient: Samantha [name] MRN: [id number] DOB: 1966/01/29  Admit date:     05/01/2022  Discharge date: 05/03/22  Discharge Physician: Samantha [name]   PCP: Samantha Anger, MD   Recommendations at discharge:   Follow-up with your primary care physician in 1 week  check CBC BMP magnesium LFT in the next visit Follow-up with [name] as outpatient in 2 to 3 weeks.  Plan is to do repeat endoscopy in 2 to 3 months.  Discharge Diagnoses: Active Problems:   Chronic pain   Adjustment disorder with mixed anxiety and depressed mood   OSA on CPAP   Memory loss   HTN (hypertension)   Occult GI bleeding   CKD (chronic kidney disease), stage III (HCC)   Prolonged QT interval   Esophageal ulceration   Cameron lesion, acute  Principal Problem (Resolved):   Symptomatic anemia Resolved Problems:   Acute encephalopathy   AKI (acute kidney injury) ([place])   GI bleed  Hospital Course: Samantha [name] is a 56 years old female with history of hypertension, hydrocephalus with VP shunt at childhood, chronic pain on opiates presented to the hospital with confusion, lightheadedness, dizziness, fall and generalized weakness.  Patient's husband reported that she was increasingly confused and had suffered a fall without loss of consciousness.  In the ED, patient was afebrile and vitals were stable.  EKG showed prolonged QT interval.  Chest x-ray was unremarkable.  Creatinine was 2.03 which was off from 1.1 in April 2023.  Hemoglobin was low at 6.8 from 9.9 earlier this month.  Patient was started on IV Protonix and [facility] was consulted due to concerns for GI bleed.  Patient however denied any obvious gross bleeding.  She however complains of bad acid reflux and takes multiple doses of NSAIDs at home for her chronic neck and back pain.    Following conditions were addressed during hospitalization,  Symptomatic anemia; occult GI bleeding secondary to [facility] lesions  and esophageal ulceration Patient presented with lightheadedness, dizziness falls and weakness.  Takes Mobic and Pepcid regularly for chronic back pain including ibuprofen.  FOBT was positive in the ED but no overt bleeding.  Patient was started on IV PPI.  GI was consulted and received I unit of packed RBC transfusion.  Patient underwent EGD on 05/02/2022 and was noted to have multiple Cameron lesions including esophageal ulceration.  At this time, GI has recommended proton pump inhibitor twice daily for 3 months, sucralfate suspension 1 g 4 times daily for 4 weeks.  Patient was advised to avoid NSAIDs as best as possible.  Patient  would need to follow-up with GI in 2 to 3 weeks in consider repeat endoscopy in 2 to 3 months to check healing of esophageal ulcers and Cameron lesions.  Pathology was obtained which is pending at this time.  Acute kidney injury. - SCr is 2.03 on admission, was 2.46 on 04/15/22, but 1.15 in April 2023.  Patient is on olmesartan as outpatient.  Has improved at this time and is at baseline at 0.8.    Acute metabolic encephalopathy  Resolved at this time.  Thought to be secondary to use of morphine with renal failure.    Chronic pain  Takes morphine chronically at home.  Renal function has improved at this time.  Advised to follow-up with pain management.  Patient was extensively advised to limit use of NSAIDs   Essential hypertension  Resume olmesartan from home.   OSA  Continue CPAP at Nighttime   Prolonged QT interval  - QTc is 519 ms in ED. no other issues or arrhythmia during hospitalization.   Consultants: GI Procedures performed: EGD with biopsy on 05/02/2022  Disposition: Home Diet recommendation:  Discharge Diet Orders (From admission, onward)     Start     Ordered   05/03/22 0000  Diet - low sodium heart healthy        05/03/22 0839           Cardiac diet DISCHARGE MEDICATION: Allergies as of 05/03/2022       Reactions   Alprazolam Other (See  Comments)   Crying all the time   Citalopram Hydrobromide Other (See Comments)   low libido        Medication List     STOP taking these medications    aspirin 81 MG tablet   famotidine 40 MG tablet Commonly known as: PEPCID   levofloxacin 500 MG tablet Commonly known as: Levaquin   meloxicam 15 MG tablet Commonly known as: MOBIC       TAKE these medications    Align 4 MG Caps Take 1 capsule (4 mg total) by mouth daily. What changed: how much to take   atorvastatin 20 MG tablet Commonly known as: LIPITOR TAKE 1 TABLET EVERY DAY AT 6 PM What changed: See the new instructions.   busPIRone 10 MG tablet Commonly known as: BUSPAR TAKE 1 TABLET THREE TIMES DAILY   calcium-vitamin D 500-200 MG-UNIT tablet Take 1 tablet by mouth 2 (two) times daily with a meal.   donepezil 10 MG tablet Commonly known as: ARICEPT TAKE 1 TABLET AT BEDTIME What changed:  how much to take how to take this when to take this additional instructions   DULoxetine 60 MG capsule Commonly known as: CYMBALTA TAKE 1 CAPSULE EVERY DAY   ferrous sulfate 325 (65 FE) MG tablet Take 1 tablet (325 mg total) by mouth daily.   gabapentin 300 MG capsule Commonly known as: NEURONTIN Take 300 mg by mouth 3 (three) times daily.   lactulose 10 GM/15ML solution Commonly known as: CHRONULAC Take 30-45 mLs (20-30 g total) by mouth 2 (two) times daily as needed for moderate constipation or severe constipation.   lamoTRIgine 100 MG tablet Commonly known as: LAMICTAL TAKE 1 TABLET EVERY MORNING, AND 1 AND 1/2 TABLETS AT NIGHT. What changed: See the new instructions.   lidocaine 5 % Commonly known as: Lidoderm Place 1 patch onto the skin daily. Remove & Discard patch within 12 hours or as directed by MD   Linzess 290 MCG Caps capsule Generic drug: linaclotide TAKE 1 CAPSULE  DAILY BEFORE BREAKFAST What changed: See the new instructions.   memantine 5 MG tablet Commonly known as:  NAMENDA Take 1 tablet (5 mg total) by mouth 2 (two) times daily. What changed: when to take this   morphine 30 MG 12 hr tablet Commonly known as: MS CONTIN Take 1 tablet (30 mg total) by mouth every 12 (twelve) hours.   naloxone 4 MG/0.1ML Liqd nasal spray kit Commonly known as: NARCAN Place 1 spray into the nose once.   olmesartan 20 MG tablet Commonly known as: Benicar Take 1 tablet (20 mg total) by mouth daily.   Oxycodone HCl 10 MG Tabs Take 1 tablet (10 mg total) by mouth 3 (three) times daily as needed. What changed: reasons to take this   pantoprazole 40 MG tablet Commonly known as: Protonix Take 1 tablet (40 mg total) by  mouth 2 (two) times daily before a meal.   promethazine 12.5 MG tablet Commonly known as: PHENERGAN Take 1 tablet (12.5 mg total) by mouth every 6 (six) hours as needed for nausea or vomiting.   STOOL SOFTENER PO Take 1 capsule by mouth 2 (two) times daily.   sucralfate 1 g tablet Commonly known as: Carafate Take 1 tablet (1 g total) by mouth 4 (four) times daily for 28 days.        Follow-up Information     Sharyn Creamer, MD. Schedule an appointment as soon as possible for a visit in 3 week(s).   Specialty: Gastroenterology Why: for ulcer followup, need for repeat EGD in 2-3 months Contact information: [address] [place] [address] [phone]         Plotnikov, Evie Lacks, MD Follow up in 1 week(s).   Specialty: Internal Medicine Contact information: [place] [address] [phone]                Subjective: Today, patient was seen and examined at bedside.  Feels okay.  Denies any nausea vomiting abdominal pain.  Discharge Exam: Filed Weights   05/01/22 2054 05/03/22 0425  Weight: 68.5 kg 71.8 kg      05/03/2022    8:10 AM 05/03/2022    4:25 AM 05/03/2022    4:00 AM  Vitals with BMI  Weight  158 lbs 5 oz   BMI  18.56   Systolic 314  970  Diastolic 78  69  Pulse 83  74     General:  Average built, not in obvious distress HENT:   Mild pallor noted.  Oral mucosa is moist.  Chest:  Clear breath sounds.  Diminished breath sounds bilaterally. No crackles or wheezes.  CVS: S1 &S2 heard. No murmur.  Regular rate and rhythm. Abdomen: Soft, nontender, nondistended.  Bowel sounds are heard.   Extremities: No cyanosis, clubbing or edema.  Peripheral pulses are palpable. Psych: Alert, awake and oriented, normal mood CNS:  No cranial nerve deficits.  Power equal in all extremities.   Skin: Warm and dry.  No rashes noted.   Condition at discharge: good  The results of significant diagnostics from this hospitalization (including imaging, microbiology, ancillary and laboratory) are listed below for reference.   Imaging Studies: CT Cervical Spine Wo Contrast  Result Date: 05/01/2022 CLINICAL DATA:  Neck trauma, midline tenderness (Age 22-64y) fall EXAM: CT CERVICAL SPINE WITHOUT CONTRAST TECHNIQUE: Multidetector CT imaging of the cervical spine was performed without intravenous contrast. Multiplanar CT image reconstructions were also generated. RADIATION DOSE REDUCTION: This exam was performed according to the departmental dose-optimization program which includes automated exposure control, adjustment of the mA and/or kV according to patient size and/or use of iterative reconstruction technique. COMPARISON:  MRI cervical spine 03/02/2013 FINDINGS: Alignment: Normal. Skull base and vertebrae: Prominent left C5 transverse foramina likely due to a tortuous vertebral artery. No acute fracture. No aggressive appearing focal osseous lesion or focal pathologic process. Soft tissues and spinal canal: No prevertebral fluid or swelling. No visible canal hematoma. Upper chest: Unremarkable. Other: None. IMPRESSION: No acute displaced fracture or traumatic listhesis of the cervical spine. Electronically Signed   By: Iven Finn M.D.   On: 05/01/2022 22:21   CT Head Wo Contrast  Result  Date: 05/01/2022 CLINICAL DATA:  Mental status change, unknown cause EXAM: CT HEAD WITHOUT CONTRAST TECHNIQUE: Contiguous axial images were obtained from the base of the skull through  the vertex without intravenous contrast. RADIATION DOSE REDUCTION: This exam was performed according to the departmental dose-optimization program which includes automated exposure control, adjustment of the mA and/or kV according to patient size and/or use of iterative reconstruction technique. COMPARISON:  CT head 10/21/2015 BRAIN: BRAIN Similar-appearing right posterior porch ventriculoperitoneal shunt with tip again noted to terminate within the inferior horn of the right lateral ventricle. Right frontotemporal encephalomalacia. No evidence of large-territorial acute infarction. No parenchymal hemorrhage. No mass lesion. No extra-axial collection. No mass effect or midline shift. Similar appearance of the lateral ventricles and third ventricle. Basilar cisterns are patent. Vascular: No hyperdense vessel. Vascular clipping again noted at the central skull base. Skull: No acute fracture or focal lesion.  Right frontal craniotomy. Sinuses/Orbits: Paranasal sinuses and mastoid air cells are clear. The orbits are unremarkable. Other: None. IMPRESSION: No acute intracranial abnormality in a patient with known right posterior approach ventriculoperitoneal shunt. Stable size of lateral and third ventricles compared to 2016. Electronically Signed   By: Iven Finn M.D.   On: 05/01/2022 21:33   DG Chest Portable 1 View  Result Date: 05/01/2022 CLINICAL DATA:  Altered mental status EXAM: PORTABLE CHEST 1 VIEW COMPARISON:  08/29/2018 FINDINGS: Mild cardiomegaly. Small hiatal hernia. No focal airspace consolidation, pleural effusion, or pneumothorax. Right-sided VP shunt catheter with similar degree of irregularity of the catheter contours within the chest. Possible catheter discontinuity the level of the right sixth rib. IMPRESSION: 1.  No acute cardiopulmonary disease. 2. Small hiatal hernia. 3. Possible VP shunt catheter discontinuity within the mid chest. Electronically Signed   By: Davina Poke D.O.   On: 05/01/2022 21:22    Microbiology: Results for orders placed or performed during the hospital encounter of 05/01/22  MRSA Next Gen by PCR, Nasal     Status: None   Collection Time: 05/02/22  8:10 PM   Specimen: Nasal Mucosa; Nasal Swab  Result Value Ref Range Status   MRSA by PCR Next Gen NOT DETECTED NOT DETECTED Final    Comment: (NOTE) The GeneXpert MRSA Assay (FDA approved for NASAL specimens only), is one component of a comprehensive MRSA colonization surveillance program. It is not intended to diagnose MRSA infection nor to guide or monitor treatment for MRSA infections. Test performance is not FDA approved in patients less than 62 years old. Performed at [facility], [place] [address] [place],  [address]     Labs: CBC: Recent Labs  Lab 05/01/22 2145 05/02/22 0542 05/02/22 1110 05/02/22 1651  WBC 9.6  --   --  9.3  NEUTROABS 5.3  --   --   --   HGB 6.8* 6.0* 8.1* 8.8*  HCT 21.4* 18.6*  --  26.4*  MCV 96.8  --   --  92.0  PLT 458*  --   --  637   Basic Metabolic Panel: Recent Labs  Lab 05/01/22 2145 05/02/22 0542 05/03/22 0252  NA 132* 135 138  K 4.1 4.1 4.2  CL 106 107 110  CO2 19* 18* 19*  GLUCOSE 99 95 83  BUN 53* 44* 19  CREATININE 2.03* 1.69* 0.83  CALCIUM 8.5* 8.6* 8.4*  MG  --  2.1  --    Liver Function Tests: Recent Labs  Lab 05/01/22 2145  AST 17  ALT 15  ALKPHOS 44  BILITOT 0.5  PROT 5.7*  ALBUMIN 3.2*   CBG: No results for input(s): "GLUCAP" in the last 168 hours.  Discharge time spent: greater than 30 minutes.  Signed: Flora Lipps, MD [facility] 05/03/2022

## 2022-05-04 ENCOUNTER — Telehealth: Payer: Self-pay

## 2022-05-04 NOTE — Telephone Encounter (Addendum)
Transition Care Management Unsuccessful Follow-up Telephone Call  Date of discharge and from where:  TCM DC [facility] 05-03-22 Dx: GI bleed  Attempts:  1st Attempt  Reason for unsuccessful TCM follow-up call:  Left voice message  Transition Care Management Unsuccessful Follow-up Telephone Call  Date of discharge and from where:   TCM DC [facility] 05-03-22 Dx: GI bleed Attempts:  2nd Attempt  Reason for unsuccessful TCM follow-up call:  Left voice message

## 2022-05-06 ENCOUNTER — Telehealth: Payer: Self-pay

## 2022-05-12 ENCOUNTER — Ambulatory Visit (INDEPENDENT_AMBULATORY_CARE_PROVIDER_SITE_OTHER): Payer: Medicare HMO | Admitting: Internal Medicine

## 2022-05-12 ENCOUNTER — Encounter: Payer: Self-pay | Admitting: Internal Medicine

## 2022-05-12 DIAGNOSIS — D509 Iron deficiency anemia, unspecified: Secondary | ICD-10-CM

## 2022-05-12 DIAGNOSIS — K253 Acute gastric ulcer without hemorrhage or perforation: Secondary | ICD-10-CM | POA: Diagnosis not present

## 2022-05-12 DIAGNOSIS — R413 Other amnesia: Secondary | ICD-10-CM

## 2022-05-12 DIAGNOSIS — M79605 Pain in left leg: Secondary | ICD-10-CM

## 2022-05-12 DIAGNOSIS — M545 Low back pain, unspecified: Secondary | ICD-10-CM

## 2022-05-12 DIAGNOSIS — G8929 Other chronic pain: Secondary | ICD-10-CM | POA: Diagnosis not present

## 2022-05-12 DIAGNOSIS — K2101 Gastro-esophageal reflux disease with esophagitis, with bleeding: Secondary | ICD-10-CM | POA: Diagnosis not present

## 2022-05-12 MED ORDER — ACCRUFER 30 MG PO CAPS: ORAL_CAPSULE | ORAL | 5 refills | Status: DC

## 2022-05-12 NOTE — Assessment & Plan Note (Signed)
No NSAIDs 

## 2022-05-12 NOTE — Telephone Encounter (Signed)
Called and spoke with pt about call from about 1 month ago. Pt said that her current insurance company is [employer], not [employer].  Pt is still wearing CPAP at night. Pt is overdue for an appt with office and stated to pt that we definitely needed to get her in for an appt. Stated to pt that I would send this message over to see if we had any info if an authorization needed to be done or if she could just get scheduled for an appt and she verbalized understanding.  Fredric Mare, please advise if you are able to see if an authorization needs to be done in order for pt to be seen for an appt or if pt can just get scheduled for one. Pt needs to be seen next available by either Dr. Maple Hudson or APP as she is overdue for an appt for CPAP compliance.

## 2022-05-12 NOTE — Assessment & Plan Note (Signed)
Recent anemia and upper GI bleed - Cameron lesions, HH. Samantha [name] was taking Meloxicam and Ibuprofen tid prior.

## 2022-05-12 NOTE — Progress Notes (Signed)
Subjective:  Patient ID: Samantha [name], female    DOB: 1966-04-29  Age: 56 y.o. MRN: [id number]  CC: No chief complaint on file.   HPI Samantha [name] presents for recent anemia and upper GI bleed - Cameron lesions, HH. Samantha [name] was taking Meloxicam and Ibuprofen tid prior.  Per hx: " Admit date:     05/01/2022  Discharge date: 05/03/22  Discharge Physician: Joycelyn Das    PCP: Tresa Garter, MD    Recommendations at discharge:    Follow-up with your primary care physician in 1 week  check CBC BMP magnesium LFT in the next visit Follow-up with Dr. Leonides Schanz GI as outpatient in 2 to 3 weeks.  Plan is to do repeat endoscopy in 2 to 3 months.   Discharge Diagnoses: Active Problems:   Chronic pain   Adjustment disorder with mixed anxiety and depressed mood   OSA on CPAP   Memory loss   HTN (hypertension)   Occult GI bleeding   CKD (chronic kidney disease), stage III (HCC)   Prolonged QT interval   Esophageal ulceration   Cameron lesion, acute   Principal Problem (Resolved):   Symptomatic anemia Resolved Problems:   Acute encephalopathy   AKI (acute kidney injury) (HCC)   GI bleed   Hospital Course: Samantha [name] is a 56 years old female with history of hypertension, hydrocephalus with VP shunt at childhood, chronic pain on opiates presented to the hospital with confusion, lightheadedness, dizziness, fall and generalized weakness.  Patient's husband reported that she was increasingly confused and had suffered a fall without loss of consciousness.  In the ED, patient was afebrile and vitals were stable.  EKG showed prolonged QT interval.  Chest x-ray was unremarkable.  Creatinine was 2.03 which was off from 1.1 in April 2023.  Hemoglobin was low at 6.8 from 9.9 earlier this month.  Patient was started on IV Protonix and [facility] was consulted due to concerns for GI bleed.  Patient however denied any obvious gross bleeding.  She however complains of bad acid reflux  and takes multiple doses of NSAIDs at home for her chronic neck and back pain.     Following conditions were addressed during hospitalization,   Symptomatic anemia; occult GI bleeding secondary to Samantha [name] lesions and esophageal ulceration Patient presented with lightheadedness, dizziness falls and weakness.  Takes Mobic and Pepcid regularly for chronic back pain including ibuprofen.  FOBT was positive in the ED but no overt bleeding.  Patient was started on IV PPI.  GI was consulted and received I unit of packed RBC transfusion.  Patient underwent EGD on 05/02/2022 and was noted to have multiple Cameron lesions including esophageal ulceration.  At this time, GI has recommended proton pump inhibitor twice daily for 3 months, sucralfate suspension 1 g 4 times daily for 4 weeks.  Patient was advised to avoid NSAIDs as best as possible.  Patient  would need to follow-up with GI in 2 to 3 weeks in consider repeat endoscopy in 2 to 3 months to check healing of esophageal ulcers and Cameron lesions.  Pathology was obtained which is pending at this time.   Acute kidney injury. - SCr is 2.03 on admission, was 2.46 on 04/15/22, but 1.15 in April 2023.  Patient is on olmesartan as outpatient.  Has improved at this time and is at baseline at 0.8.     Acute metabolic encephalopathy  Resolved at this time.  Thought to be secondary to use of  morphine with renal failure.    Chronic pain  Takes morphine chronically at home.  Renal function has improved at this time.  Advised to follow-up with pain management.  Patient was extensively advised to limit use of NSAIDs   Essential hypertension  Resume olmesartan from home.   OSA  Continue CPAP at Nighttime   Prolonged QT interval  - QTc is 519 ms in ED. no other issues or arrhythmia during hospitalization.   Consultants: GI Procedures performed: EGD with biopsy on 05/02/2022"  Outpatient Medications Prior to Visit  Medication Sig Dispense Refill   atorvastatin  (LIPITOR) 20 MG tablet TAKE 1 TABLET EVERY DAY AT 6 PM (Patient taking differently: Take 20 mg by mouth daily.) 90 tablet 3   busPIRone (BUSPAR) 10 MG tablet TAKE 1 TABLET THREE TIMES DAILY (Patient taking differently: Take 10 mg by mouth 3 (three) times daily.) 270 tablet 1   Calcium Carbonate-Vitamin D (CALCIUM-VITAMIN D) 500-200 MG-UNIT per tablet Take 1 tablet by mouth 2 (two) times daily with a meal.     Docusate Calcium (STOOL SOFTENER PO) Take 1 capsule by mouth 2 (two) times daily.     donepezil (ARICEPT) 10 MG tablet TAKE 1 TABLET AT BEDTIME (Patient taking differently: Take 10 mg by mouth at bedtime.) 90 tablet 3   DULoxetine (CYMBALTA) 60 MG capsule TAKE 1 CAPSULE EVERY DAY (Patient taking differently: Take 60 mg by mouth daily.) 90 capsule 3   gabapentin (NEURONTIN) 300 MG capsule Take 300 mg by mouth 3 (three) times daily.     lactulose (CHRONULAC) 10 GM/15ML solution Take 30-45 mLs (20-30 g total) by mouth 2 (two) times daily as needed for moderate constipation or severe constipation. 946 mL 5   lamoTRIgine (LAMICTAL) 100 MG tablet TAKE 1 TABLET EVERY MORNING, AND 1 AND 1/2 TABLETS AT NIGHT. (Patient taking differently: Take 100-150 mg by mouth 2 (two) times daily.) 225 tablet 0   lidocaine (LIDODERM) 5 % Place 1 patch onto the skin daily. Remove & Discard patch within 12 hours or as directed by MD 180 patch 1   LINZESS 290 MCG CAPS capsule TAKE 1 CAPSULE  DAILY BEFORE BREAKFAST (Patient taking differently: Take 290 mcg by mouth daily before breakfast.) 90 capsule 0   memantine (NAMENDA) 5 MG tablet Take 1 tablet (5 mg total) by mouth 2 (two) times daily. (Patient taking differently: Take 5 mg by mouth daily.) 180 tablet 3   morphine (MS CONTIN) 30 MG 12 hr tablet Take 1 tablet (30 mg total) by mouth every 12 (twelve) hours. 60 tablet 0   naloxone (NARCAN) nasal spray 4 mg/0.1 mL Place 1 spray into the nose once.     olmesartan (BENICAR) 20 MG tablet Take 1 tablet (20 mg total) by mouth  daily. 90 tablet 3   Oxycodone HCl 10 MG TABS Take 1 tablet (10 mg total) by mouth 3 (three) times daily as needed. (Patient taking differently: Take 10 mg by mouth 3 (three) times daily as needed (pain).) 90 tablet 0   pantoprazole (PROTONIX) 40 MG tablet Take 1 tablet (40 mg total) by mouth 2 (two) times daily before a meal. 180 tablet 0   Probiotic Product (ALIGN) 4 MG CAPS Take 1 capsule (4 mg total) by mouth daily. (Patient taking differently: Take 0.5 capsules by mouth daily.) 90 capsule 1   promethazine (PHENERGAN) 12.5 MG tablet Take 1 tablet (12.5 mg total) by mouth every 6 (six) hours as needed for nausea or vomiting. 40 tablet 0  sucralfate (CARAFATE) 1 g tablet Take 1 tablet (1 g total) by mouth 4 (four) times daily for 28 days. 112 tablet 0   ferrous sulfate 325 (65 FE) MG tablet Take 1 tablet (325 mg total) by mouth daily. 30 tablet 6   No facility-administered medications prior to visit.    ROS: Review of Systems  Constitutional:  Negative for activity change, appetite change, chills, fatigue and unexpected weight change.  HENT:  Negative for congestion, mouth sores and sinus pressure.   Eyes:  Negative for visual disturbance.  Respiratory:  Negative for cough and chest tightness.   Gastrointestinal:  Negative for abdominal pain and nausea.  Genitourinary:  Negative for difficulty urinating, frequency and vaginal pain.  Musculoskeletal:  Positive for back pain and gait problem.  Skin:  Negative for pallor and rash.  Neurological:  Negative for dizziness, tremors, weakness, numbness and headaches.  Hematological:  Does not bruise/bleed easily.  Psychiatric/Behavioral:  Positive for decreased concentration. Negative for confusion and sleep disturbance.     Objective:  BP 132/90 (BP Location: Left Arm, Patient Position: Sitting, Cuff Size: Normal)   Pulse 93   Temp 98.4 F (36.9 C) (Oral)   Ht 4\' 10"  (1.473 m)   Wt 149 lb (67.6 kg)   SpO2 97%   BMI 31.14 kg/m   BP  Readings from Last 3 Encounters:  05/12/22 132/90  05/03/22 134/78  04/28/22 130/88    Wt Readings from Last 3 Encounters:  05/12/22 149 lb (67.6 kg)  05/03/22 158 lb 4.6 oz (71.8 kg)  04/28/22 149 lb (67.6 kg)    Physical Exam Constitutional:      General: She is not in acute distress.    Appearance: She is well-developed. She is obese.  HENT:     Head: Normocephalic.     Right Ear: External ear normal.     Left Ear: External ear normal.     Nose: Nose normal.  Eyes:     General:        Right eye: No discharge.        Left eye: No discharge.     Conjunctiva/sclera: Conjunctivae normal.     Pupils: Pupils are equal, round, and reactive to light.  Neck:     Thyroid: No thyromegaly.     Vascular: No JVD.     Trachea: No tracheal deviation.  Cardiovascular:     Rate and Rhythm: Normal rate and regular rhythm.     Heart sounds: Normal heart sounds.  Pulmonary:     Effort: No respiratory distress.     Breath sounds: No stridor. No wheezing.  Abdominal:     General: Bowel sounds are normal. There is no distension.     Palpations: Abdomen is soft. There is no mass.     Tenderness: There is no abdominal tenderness. There is no guarding or rebound.  Musculoskeletal:        General: Tenderness present.     Cervical back: Normal range of motion and neck supple. No rigidity.  Lymphadenopathy:     Cervical: No cervical adenopathy.  Skin:    Findings: No erythema or rash.  Neurological:     Mental Status: She is oriented to person, place, and time.     Cranial Nerves: No cranial nerve deficit.     Motor: No abnormal muscle tone.     Coordination: Coordination normal.     Gait: Gait abnormal.     Deep Tendon Reflexes: Reflexes normal.  Psychiatric:  Behavior: Behavior normal.        Thought Content: Thought content normal.        Judgment: Judgment normal.     Lab Results  Component Value Date   WBC 9.3 05/02/2022   HGB 8.8 (L) 05/02/2022   HCT 26.4 (L)  05/02/2022   PLT 399 05/02/2022   GLUCOSE 83 05/03/2022   CHOL 217 (H) 09/02/2021   TRIG 204.0 (H) 09/02/2021   HDL 52.80 09/02/2021   LDLDIRECT 120.0 09/02/2021   LDLCALC 85 07/16/2020   ALT 15 05/01/2022   AST 17 05/01/2022   NA 138 05/03/2022   K 4.2 05/03/2022   CL 110 05/03/2022   CREATININE 0.83 05/03/2022   BUN 19 05/03/2022   CO2 19 (L) 05/03/2022   TSH 3.09 09/02/2021   INR 1.0 05/02/2022   HGBA1C 5.7 01/12/2022    CT Cervical Spine Wo Contrast  Result Date: 05/01/2022 CLINICAL DATA:  Neck trauma, midline tenderness (Age 56-64y) fall EXAM: CT CERVICAL SPINE WITHOUT CONTRAST TECHNIQUE: Multidetector CT imaging of the cervical spine was performed without intravenous contrast. Multiplanar CT image reconstructions were also generated. RADIATION DOSE REDUCTION: This exam was performed according to the departmental dose-optimization program which includes automated exposure control, adjustment of the mA and/or kV according to patient size and/or use of iterative reconstruction technique. COMPARISON:  MRI cervical spine 03/02/2013 FINDINGS: Alignment: Normal. Skull base and vertebrae: Prominent left C5 transverse foramina likely due to a tortuous vertebral artery. No acute fracture. No aggressive appearing focal osseous lesion or focal pathologic process. Soft tissues and spinal canal: No prevertebral fluid or swelling. No visible canal hematoma. Upper chest: Unremarkable. Other: None. IMPRESSION: No acute displaced fracture or traumatic listhesis of the cervical spine. Electronically Signed   By: Iven Finn M.D.   On: 05/01/2022 22:21   CT Head Wo Contrast  Result Date: 05/01/2022 CLINICAL DATA:  Mental status change, unknown cause EXAM: CT HEAD WITHOUT CONTRAST TECHNIQUE: Contiguous axial images were obtained from the base of the skull through the vertex without intravenous contrast. RADIATION DOSE REDUCTION: This exam was performed according to the departmental dose-optimization  program which includes automated exposure control, adjustment of the mA and/or kV according to patient size and/or use of iterative reconstruction technique. COMPARISON:  CT head 10/21/2015 BRAIN: BRAIN Similar-appearing right posterior porch ventriculoperitoneal shunt with tip again noted to terminate within the inferior horn of the right lateral ventricle. Right frontotemporal encephalomalacia. No evidence of large-territorial acute infarction. No parenchymal hemorrhage. No mass lesion. No extra-axial collection. No mass effect or midline shift. Similar appearance of the lateral ventricles and third ventricle. Basilar cisterns are patent. Vascular: No hyperdense vessel. Vascular clipping again noted at the central skull base. Skull: No acute fracture or focal lesion.  Right frontal craniotomy. Sinuses/Orbits: Paranasal sinuses and mastoid air cells are clear. The orbits are unremarkable. Other: None. IMPRESSION: No acute intracranial abnormality in a patient with known right posterior approach ventriculoperitoneal shunt. Stable size of lateral and third ventricles compared to 2016. Electronically Signed   By: Iven Finn M.D.   On: 05/01/2022 21:33   DG Chest Portable 1 View  Result Date: 05/01/2022 CLINICAL DATA:  Altered mental status EXAM: PORTABLE CHEST 1 VIEW COMPARISON:  08/29/2018 FINDINGS: Mild cardiomegaly. Small hiatal hernia. No focal airspace consolidation, pleural effusion, or pneumothorax. Right-sided VP shunt catheter with similar degree of irregularity of the catheter contours within the chest. Possible catheter discontinuity the level of the right sixth rib. IMPRESSION: 1. No acute cardiopulmonary  disease. 2. Small hiatal hernia. 3. Possible VP shunt catheter discontinuity within the mid chest. Electronically Signed   By: Davina Poke D.O.   On: 05/01/2022 21:22    Assessment & Plan:   Problem List Items Addressed This Visit     Anemia    recent anemia and upper GI bleed -  Cameron lesions, HH. Samantha [name] was taking Meloxicam and Ibuprofen tid prior. FeSO4 intolerant - constipated Start Accrufer bid at [employer]      Relevant Medications   Ferric Maltol (ACCRUFER) 30 MG CAPS   Other Relevant Orders   CBC with Differential/Platelet   Comprehensive metabolic panel   Cameron lesion, acute    Samantha [name] was taking Meloxicam and Ibuprofen tid prior to bleeding. No NSAIDs      Relevant Orders   CBC with Differential/Platelet   Comprehensive metabolic panel   Chronic pain    No NSAIDs      GERD (gastroesophageal reflux disease)    Recent anemia and upper GI bleed - Cameron lesions, HH. Samantha [name] was taking Meloxicam and Ibuprofen tid prior.      Low back pain radiating to left lower extremity    On meds       Memory loss    Better w/corrected anemia          Meds ordered this encounter  Medications   Ferric Maltol (ACCRUFER) 30 MG CAPS    Sig: 1 po bid    Dispense:  60 capsule    Refill:  5      Follow-up: Return for a follow-up visit.  Walker Kehr, MD

## 2022-05-12 NOTE — Assessment & Plan Note (Signed)
Samantha [name] was taking Meloxicam and Ibuprofen tid prior to bleeding. No NSAIDs

## 2022-05-12 NOTE — Assessment & Plan Note (Signed)
Better w/corrected anemia

## 2022-05-12 NOTE — Assessment & Plan Note (Signed)
On meds

## 2022-05-12 NOTE — Assessment & Plan Note (Addendum)
recent anemia and upper GI bleed - Cameron lesions, HH. Samantha [name] was taking Meloxicam and Ibuprofen tid prior. FeSO4 intolerant - constipated Start Accrufer bid at [employer]

## 2022-05-19 NOTE — Telephone Encounter (Signed)
Spoke with two representatives with [facility]- one states an authorization was not needed and one states that one was needed. Created an authorization to be sure, [id number] REF # [id number] and Francine Graven is faxing authorization over. Patient is scheduled for 05/24/2022 at 2pm with Dr. Maple Hudson.

## 2022-05-21 NOTE — Progress Notes (Deleted)
HPI female never smoker followed for OSA, complicated by history of migraine, hydrocephalus/VP shunt/  and concern for nocturnal seizures, Hx CVA for which she is followed by Neurology, back pain NPSG 03/08/15 at [facility] with full seizure montage- AHI 12.1/ hr, desaturation to 91%, no seizure activity NPSG 10/27/16   AHI 13.9/ hr, desaturation to 83%,  body weight 130 lbs  ------------------------------------------------------------------------------------------------   03/15/17- 56 year old female never smoker followed by Dr. Craige Cotta for OSA, complicated by history of migraine, hydrocephalus/VP shunt and concern for nocturnal seizures, Hx CVA for which she is followed by Neurology, back pain CPAP auto 5-15/[facility] FOLLOWS FOR DME IS [facility]  Down load is attached patient wears cpap every night and with napping  Download 93%/4 hours, AHI 0.7/hour. Uses melatonin for sleep along with her Cymbalta and says this works well. Using CPAP routinely including with naps, taking a nap most afternoons. She is quite satisfied and definitely sleeps better with CPAP. Settled on a nasal mask.  05/24/22- 56 yoF never smoker followed for OSA, complicated by history of migraine, hydrocephalus/VP shunt and concern for nocturnal seizures, Hx CVA for which she is followed by Neurology, back pain, Anemia, HTN,  CPAP auto 5-15/ [employer] Download compliance- Body weight today- Hosp June for FE anemia due to esoph ulceration   ROS-see HPI   + = pos Constitutional:    weight loss, night sweats, fevers, chills, fatigue, lassitude. HEENT:    headaches, difficulty swallowing, tooth/dental problems, sore throat,       sneezing, itching, ear ache, nasal congestion, post nasal drip, snoring CV:    chest pain, orthopnea, PND, swelling in lower extremities, anasarca,                                                         dizziness, palpitations Resp:   shortness of breath with exertion or at rest.                productive  cough,   non-productive cough, coughing up of blood.              change in color of mucus.  wheezing.   Skin:    rash or lesions. GI:  No-   heartburn, indigestion, abdominal pain, nausea, vomiting, diarrhea,                 change in bowel habits, loss of appetite GU: dysuria, change in color of urine, no urgency or frequency.   flank pain. MS:   joint pain, stiffness, decreased range of motion, back pain. Neuro-     nothing unusual Psych:  change in mood or affect.  depression or anxiety.   memory loss.  OBJ- Physical Exam           stable baseline exam General- Alert, Oriented, Affect-appropriate, Distress- none acute Skin- rash-none, lesions- none, excoriation- none Lymphadenopathy- none Head- atraumatic, + R VP shunt            Eyes- Gross vision intact, PERRLA, conjunctivae and secretions clear            Ears- Hearing, canals-normal            Nose- Clear, no-Septal dev, mucus, polyps, erosion, perforation             Throat- Mallampati III-IV , mucosa clear ,  drainage- none, tonsils- atrophic Neck- flexible , trachea midline, no stridor , thyroid nl, carotid no bruit Chest - symmetrical excursion , unlabored           Heart/CV- RRR , no murmur , no gallop  , no rub, nl s1 s2                           - JVD- none , edema- none, stasis changes- none, varices- none           Lung- clear to P&A, wheeze- none, cough- none , dullness-none, rub- none           Chest wall-  Abd-  Br/ Gen/ Rectal- Not done, not indicated Extrem- cyanosis- none, clubbing, none, atrophy- none, strength- nl Neuro- grossly intact to observation

## 2022-05-24 ENCOUNTER — Ambulatory Visit: Payer: Medicare HMO | Admitting: Internal Medicine

## 2022-05-25 ENCOUNTER — Other Ambulatory Visit (INDEPENDENT_AMBULATORY_CARE_PROVIDER_SITE_OTHER): Payer: Medicare HMO

## 2022-05-25 DIAGNOSIS — K253 Acute gastric ulcer without hemorrhage or perforation: Secondary | ICD-10-CM | POA: Diagnosis not present

## 2022-05-25 DIAGNOSIS — D509 Iron deficiency anemia, unspecified: Secondary | ICD-10-CM | POA: Diagnosis not present

## 2022-05-25 LAB — COMPREHENSIVE METABOLIC PANEL
ALT: 14 U/L (ref 0–35)
AST: 22 U/L (ref 0–37)
Albumin: 4.1 g/dL (ref 3.5–5.2)
Alkaline Phosphatase: 60 U/L (ref 39–117)
BUN: 9 mg/dL (ref 6–23)
CO2: 31 mEq/L (ref 19–32)
Calcium: 9.8 mg/dL (ref 8.4–10.5)
Chloride: 107 mEq/L (ref 96–112)
Creatinine, Ser: 1.16 mg/dL (ref 0.40–1.20)
GFR: 52.11 mL/min — ABNORMAL LOW (ref >60.00)
Glucose, Bld: 99 mg/dL (ref 70–99)
Potassium: 4.1 mEq/L (ref 3.5–5.1)
Sodium: 141 mEq/L (ref 135–145)
Total Bilirubin: 0.3 mg/dL (ref 0.2–1.2)
Total Protein: 7 g/dL (ref 6.0–8.3)

## 2022-05-25 LAB — CBC WITH DIFFERENTIAL/PLATELET
Basophils Absolute: 0.1 10*3/uL (ref 0.0–0.1)
Basophils Relative: 1.1 % (ref 0.0–3.0)
Eosinophils Absolute: 0.3 10*3/uL (ref 0.0–0.7)
Eosinophils Relative: 5.4 % — ABNORMAL HIGH (ref 0.0–5.0)
HCT: 33.1 % — ABNORMAL LOW (ref 36.0–46.0)
Hemoglobin: 10.6 g/dL — ABNORMAL LOW (ref 12.0–15.0)
Lymphocytes Relative: 50.5 % — ABNORMAL HIGH (ref 12.0–46.0)
Lymphs Abs: 2.6 10*3/uL (ref 0.7–4.0)
MCHC: 31.9 g/dL (ref 30.0–36.0)
MCV: 93.5 fl (ref 78.0–100.0)
Monocytes Absolute: 0.4 10*3/uL (ref 0.1–1.0)
Monocytes Relative: 8.4 % (ref 3.0–12.0)
Neutro Abs: 1.8 10*3/uL (ref 1.4–7.7)
Neutrophils Relative %: 34.6 % — ABNORMAL LOW (ref 43.0–77.0)
Platelets: 421 10*3/uL — ABNORMAL HIGH (ref 150.0–400.0)
RBC: 3.54 Mil/uL — ABNORMAL LOW (ref 3.87–5.11)
RDW: 15.4 % (ref 11.5–15.5)
WBC: 5.2 10*3/uL (ref 4.0–10.5)

## 2022-05-26 ENCOUNTER — Ambulatory Visit (INDEPENDENT_AMBULATORY_CARE_PROVIDER_SITE_OTHER): Payer: Medicare HMO | Admitting: Internal Medicine

## 2022-05-26 ENCOUNTER — Encounter: Payer: Self-pay | Admitting: Internal Medicine

## 2022-05-26 ENCOUNTER — Ambulatory Visit: Payer: Medicare HMO | Admitting: Internal Medicine

## 2022-05-26 DIAGNOSIS — D509 Iron deficiency anemia, unspecified: Secondary | ICD-10-CM

## 2022-05-26 DIAGNOSIS — K253 Acute gastric ulcer without hemorrhage or perforation: Secondary | ICD-10-CM

## 2022-05-26 DIAGNOSIS — M79605 Pain in left leg: Secondary | ICD-10-CM

## 2022-05-26 DIAGNOSIS — M545 Low back pain, unspecified: Secondary | ICD-10-CM

## 2022-05-26 MED ORDER — MORPHINE SULFATE ER 30 MG PO TBCR: 30.0000 mg | EXTENDED_RELEASE_TABLET | Freq: Two times a day (BID) | ORAL | 0 refills | Status: DC

## 2022-05-26 MED ORDER — OXYCODONE HCL 10 MG PO TABS: 10.0000 mg | ORAL_TABLET | Freq: Three times a day (TID) | ORAL | 0 refills | Status: DC | PRN

## 2022-05-26 NOTE — Assessment & Plan Note (Signed)
No relapse. On Carafate and Protonix

## 2022-05-26 NOTE — Assessment & Plan Note (Signed)
Chronic pain in the back, hips - I took over Samantha [name]'s pain management (she was treated at [facility] in the past). On Oxycodone and MS Contin   Potential benefits of a long term opioids use as well as potential risks (i.e. addiction risk, apnea etc) and complications (i.e. Somnolence, constipation and others) were explained to the patient and were aknowledged. 

## 2022-05-26 NOTE — Assessment & Plan Note (Signed)
Started Accrufer bid 3 d ago. Better

## 2022-05-26 NOTE — Progress Notes (Signed)
Subjective:  Patient ID: Samantha [name], female    DOB: 04/19/1966  Age: 56 y.o. MRN: [id number]  CC: No chief complaint on file.   HPI Samantha [name] presents for anemia, GI bleed and chronic pain. Started Accrufer bid 3 d ago  Outpatient Medications Prior to Visit  Medication Sig Dispense Refill   atorvastatin (LIPITOR) 20 MG tablet TAKE 1 TABLET EVERY DAY AT 6 PM (Patient taking differently: Take 20 mg by mouth daily.) 90 tablet 3   busPIRone (BUSPAR) 10 MG tablet TAKE 1 TABLET THREE TIMES DAILY (Patient taking differently: Take 10 mg by mouth 3 (three) times daily.) 270 tablet 1   Calcium Carbonate-Vitamin D (CALCIUM-VITAMIN D) 500-200 MG-UNIT per tablet Take 1 tablet by mouth 2 (two) times daily with a meal.     Docusate Calcium (STOOL SOFTENER PO) Take 1 capsule by mouth 2 (two) times daily.     donepezil (ARICEPT) 10 MG tablet TAKE 1 TABLET AT BEDTIME (Patient taking differently: Take 10 mg by mouth at bedtime.) 90 tablet 3   DULoxetine (CYMBALTA) 60 MG capsule TAKE 1 CAPSULE EVERY DAY (Patient taking differently: Take 60 mg by mouth daily.) 90 capsule 3   Ferric Maltol (ACCRUFER) 30 MG CAPS 1 po bid 60 capsule 5   gabapentin (NEURONTIN) 300 MG capsule Take 300 mg by mouth 3 (three) times daily.     lactulose (CHRONULAC) 10 GM/15ML solution Take 30-45 mLs (20-30 g total) by mouth 2 (two) times daily as needed for moderate constipation or severe constipation. 946 mL 5   lamoTRIgine (LAMICTAL) 100 MG tablet TAKE 1 TABLET EVERY MORNING, AND 1 AND 1/2 TABLETS AT NIGHT. (Patient taking differently: Take 100-150 mg by mouth 2 (two) times daily.) 225 tablet 0   lidocaine (LIDODERM) 5 % Place 1 patch onto the skin daily. Remove & Discard patch within 12 hours or as directed by MD 180 patch 1   LINZESS 290 MCG CAPS capsule TAKE 1 CAPSULE  DAILY BEFORE BREAKFAST (Patient taking differently: Take 290 mcg by mouth daily before breakfast.) 90 capsule 0   memantine (NAMENDA) 5 MG tablet Take 1  tablet (5 mg total) by mouth 2 (two) times daily. (Patient taking differently: Take 5 mg by mouth daily.) 180 tablet 3   naloxone (NARCAN) nasal spray 4 mg/0.1 mL Place 1 spray into the nose once.     olmesartan (BENICAR) 20 MG tablet Take 1 tablet (20 mg total) by mouth daily. 90 tablet 3   pantoprazole (PROTONIX) 40 MG tablet Take 1 tablet (40 mg total) by mouth 2 (two) times daily before a meal. 180 tablet 0   Probiotic Product (ALIGN) 4 MG CAPS Take 1 capsule (4 mg total) by mouth daily. (Patient taking differently: Take 0.5 capsules by mouth daily.) 90 capsule 1   promethazine (PHENERGAN) 12.5 MG tablet Take 1 tablet (12.5 mg total) by mouth every 6 (six) hours as needed for nausea or vomiting. 40 tablet 0   sucralfate (CARAFATE) 1 g tablet Take 1 tablet (1 g total) by mouth 4 (four) times daily for 28 days. 112 tablet 0   morphine (MS CONTIN) 30 MG 12 hr tablet Take 1 tablet (30 mg total) by mouth every 12 (twelve) hours. 60 tablet 0   Oxycodone HCl 10 MG TABS Take 1 tablet (10 mg total) by mouth 3 (three) times daily as needed. (Patient taking differently: Take 10 mg by mouth 3 (three) times daily as needed (pain).) 90 tablet 0   No  facility-administered medications prior to visit.    ROS: Review of Systems  Constitutional:  Negative for activity change, appetite change, chills, fatigue and unexpected weight change.  HENT:  Negative for congestion, mouth sores and sinus pressure.   Eyes:  Negative for visual disturbance.  Respiratory:  Negative for cough and chest tightness.   Gastrointestinal:  Negative for abdominal pain and nausea.  Genitourinary:  Negative for difficulty urinating, frequency and vaginal pain.  Musculoskeletal:  Positive for arthralgias and back pain. Negative for gait problem.  Skin:  Negative for pallor and rash.  Neurological:  Negative for dizziness, tremors, weakness, numbness and headaches.  Psychiatric/Behavioral:  Negative for confusion and sleep  disturbance.     Objective:  BP 130/82 (BP Location: Left Arm, Patient Position: Sitting, Cuff Size: Normal)   Pulse 61   Temp 99.3 F (37.4 C) (Oral)   Ht 4\' 10"  (1.473 m)   Wt 151 lb (68.5 kg)   SpO2 96%   BMI 31.56 kg/m   BP Readings from Last 3 Encounters:  05/26/22 130/82  05/12/22 132/90  05/03/22 134/78    Wt Readings from Last 3 Encounters:  05/26/22 151 lb (68.5 kg)  05/12/22 149 lb (67.6 kg)  05/03/22 158 lb 4.6 oz (71.8 kg)    Physical Exam Constitutional:      Appearance: She is obese.  Musculoskeletal:        General: Tenderness present.  Neurological:     Mental Status: She is oriented to person, place, and time.     Gait: Gait normal.     Lab Results  Component Value Date   WBC 5.2 05/25/2022   HGB 10.6 (L) 05/25/2022   HCT 33.1 (L) 05/25/2022   PLT 421.0 (H) 05/25/2022   GLUCOSE 99 05/25/2022   CHOL 217 (H) 09/02/2021   TRIG 204.0 (H) 09/02/2021   HDL 52.80 09/02/2021   LDLDIRECT 120.0 09/02/2021   LDLCALC 85 07/16/2020   ALT 14 05/25/2022   AST 22 05/25/2022   NA 141 05/25/2022   K 4.1 05/25/2022   CL 107 05/25/2022   CREATININE 1.16 05/25/2022   BUN 9 05/25/2022   CO2 31 05/25/2022   TSH 3.09 09/02/2021   INR 1.0 05/02/2022   HGBA1C 5.7 01/12/2022    CT Cervical Spine Wo Contrast  Result Date: 05/01/2022 CLINICAL DATA:  Neck trauma, midline tenderness (Age 45-64y) fall EXAM: CT CERVICAL SPINE WITHOUT CONTRAST TECHNIQUE: Multidetector CT imaging of the cervical spine was performed without intravenous contrast. Multiplanar CT image reconstructions were also generated. RADIATION DOSE REDUCTION: This exam was performed according to the departmental dose-optimization program which includes automated exposure control, adjustment of the mA and/or kV according to patient size and/or use of iterative reconstruction technique. COMPARISON:  MRI cervical spine 03/02/2013 FINDINGS: Alignment: Normal. Skull base and vertebrae: Prominent left C5  transverse foramina likely due to a tortuous vertebral artery. No acute fracture. No aggressive appearing focal osseous lesion or focal pathologic process. Soft tissues and spinal canal: No prevertebral fluid or swelling. No visible canal hematoma. Upper chest: Unremarkable. Other: None. IMPRESSION: No acute displaced fracture or traumatic listhesis of the cervical spine. Electronically Signed   By: 03/04/2013 M.D.   On: 05/01/2022 22:21   CT Head Wo Contrast  Result Date: 05/01/2022 CLINICAL DATA:  Mental status change, unknown cause EXAM: CT HEAD WITHOUT CONTRAST TECHNIQUE: Contiguous axial images were obtained from the base of the skull through the vertex without intravenous contrast. RADIATION DOSE REDUCTION: This exam was  performed according to the departmental dose-optimization program which includes automated exposure control, adjustment of the mA and/or kV according to patient size and/or use of iterative reconstruction technique. COMPARISON:  CT head 10/21/2015 BRAIN: BRAIN Similar-appearing right posterior porch ventriculoperitoneal shunt with tip again noted to terminate within the inferior horn of the right lateral ventricle. Right frontotemporal encephalomalacia. No evidence of large-territorial acute infarction. No parenchymal hemorrhage. No mass lesion. No extra-axial collection. No mass effect or midline shift. Similar appearance of the lateral ventricles and third ventricle. Basilar cisterns are patent. Vascular: No hyperdense vessel. Vascular clipping again noted at the central skull base. Skull: No acute fracture or focal lesion.  Right frontal craniotomy. Sinuses/Orbits: Paranasal sinuses and mastoid air cells are clear. The orbits are unremarkable. Other: None. IMPRESSION: No acute intracranial abnormality in a patient with known right posterior approach ventriculoperitoneal shunt. Stable size of lateral and third ventricles compared to 2016. Electronically Signed   By: Tish Frederickson  M.D.   On: 05/01/2022 21:33   DG Chest Portable 1 View  Result Date: 05/01/2022 CLINICAL DATA:  Altered mental status EXAM: PORTABLE CHEST 1 VIEW COMPARISON:  08/29/2018 FINDINGS: Mild cardiomegaly. Small hiatal hernia. No focal airspace consolidation, pleural effusion, or pneumothorax. Right-sided VP shunt catheter with similar degree of irregularity of the catheter contours within the chest. Possible catheter discontinuity the level of the right sixth rib. IMPRESSION: 1. No acute cardiopulmonary disease. 2. Small hiatal hernia. 3. Possible VP shunt catheter discontinuity within the mid chest. Electronically Signed   By: Duanne Guess D.O.   On: 05/01/2022 21:22    Assessment & Plan:   Problem List Items Addressed This Visit     Anemia    Started Accrufer bid 3 d ago. Better      Relevant Orders   Comprehensive metabolic panel   CBC with Differential/Platelet   Iron, TIBC and Ferritin Panel   Cameron lesion, acute    No relapse. On Carafate and Protonix      Relevant Orders   Comprehensive metabolic panel   Low back pain radiating to left lower extremity    Chronic pain in the back, hips - I took over [name]'s pain management (she was treated at [facility] in the past). On Oxycodone and MS Contin   Potential benefits of a long term opioids use as well as potential risks (i.e. addiction risk, apnea etc) and complications (i.e. Somnolence, constipation and others) were explained to the patient and were aknowledged.       Relevant Medications   Oxycodone HCl 10 MG TABS   morphine (MS CONTIN) 30 MG 12 hr tablet   morphine (MS CONTIN) 30 MG 12 hr tablet   Oxycodone HCl 10 MG TABS      Meds ordered this encounter  Medications   Oxycodone HCl 10 MG TABS    Sig: Take 1 tablet (10 mg total) by mouth 3 (three) times daily as needed.    Dispense:  90 tablet    Refill:  0    Please fill on or after 05/28/22   morphine (MS CONTIN) 30 MG 12 hr tablet    Sig: Take 1  tablet (30 mg total) by mouth every 12 (twelve) hours.    Dispense:  60 tablet    Refill:  0    Please fill on or after 05/28/22   morphine (MS CONTIN) 30 MG 12 hr tablet    Sig: Take 1 tablet (30 mg total) by mouth every 12 (twelve)  hours.    Dispense:  60 tablet    Refill:  0    Please fill on or after 06/27/22   Oxycodone HCl 10 MG TABS    Sig: Take 1 tablet (10 mg total) by mouth 3 (three) times daily as needed.    Dispense:  90 tablet    Refill:  0    Please fill on or after 06/27/22      Follow-up: Return in about 2 months (around 07/27/2022) for a follow-up visit.  Sonda Primes, MD

## 2022-06-02 NOTE — Telephone Encounter (Signed)
Patient was scheduled for an appt on 07/11 but she cancelled due to lack of transportation. She will call back to get scheduled for an appt.

## 2022-06-06 ENCOUNTER — Telehealth: Payer: Self-pay

## 2022-06-06 MED ORDER — SUCRALFATE 1 G PO TABS: 1.0000 g | ORAL_TABLET | Freq: Four times a day (QID) | ORAL | 0 refills | Status: DC

## 2022-06-06 NOTE — Telephone Encounter (Signed)
I have sent in 1 month supply however it is not clear if she is supposed to keep taking this medicine when it ran out. It looks like she was supposed to follow up with GI after the hospital in 2-3 weeks. I do not see that she did this. Please have her call and get in with GI and they will decide if this is ongoing.

## 2022-06-06 NOTE — Telephone Encounter (Signed)
LVM for pt of Dr. Lawana Chambers instructions. Meds are refilled for one month and pt is to see GI

## 2022-06-06 NOTE — Telephone Encounter (Signed)
Pt is requesting a refill for: sucralfate (CARAFATE) 1 g tablet (Expired (Prescribing provider was from the [facility])  Pharmacy: [facility] [id number] - , [place] - [phone]  LOV 05/26/22 ROV 07/28/22

## 2022-06-10 ENCOUNTER — Encounter: Payer: Self-pay | Admitting: Nurse Practitioner

## 2022-06-11 ENCOUNTER — Other Ambulatory Visit: Payer: Self-pay | Admitting: Internal Medicine

## 2022-06-20 ENCOUNTER — Telehealth: Payer: Self-pay | Admitting: Internal Medicine

## 2022-06-20 ENCOUNTER — Other Ambulatory Visit: Payer: Self-pay

## 2022-06-20 MED ORDER — OLMESARTAN MEDOXOMIL 20 MG PO TABS: 20.0000 mg | ORAL_TABLET | Freq: Every day | ORAL | 3 refills | Status: DC

## 2022-06-20 NOTE — Telephone Encounter (Signed)
Caller & Relationship to patient: [facility]  Call back number: [phone]  Date of last office visit: 05/26/22  Date of next office visit: 07/28/22  Medication(s) to be refilled:  olmesartan (BENICAR) 20 MG tablet  Preferred Pharmacy:  [facility] - [place], [place] - [address] Phone:  [phone]  Fax:  [phone]

## 2022-06-22 NOTE — Telephone Encounter (Signed)
Rx has been sent by Byrd Hesselbach, RMA... Closing encounter.Marland KitchenRaechel [name]

## 2022-06-23 ENCOUNTER — Other Ambulatory Visit: Payer: Self-pay | Admitting: Internal Medicine

## 2022-06-23 DIAGNOSIS — M25561 Pain in right knee: Secondary | ICD-10-CM | POA: Diagnosis not present

## 2022-06-23 DIAGNOSIS — M47816 Spondylosis without myelopathy or radiculopathy, lumbar region: Secondary | ICD-10-CM | POA: Diagnosis not present

## 2022-06-23 DIAGNOSIS — M7061 Trochanteric bursitis, right hip: Secondary | ICD-10-CM | POA: Diagnosis not present

## 2022-07-08 ENCOUNTER — Encounter: Payer: Self-pay | Admitting: *Deleted

## 2022-07-10 NOTE — Progress Notes (Deleted)
07/10/2022 [name] [id number] November 05, 1966   CHIEF COMPLAINT:   HISTORY OF PRESENT ILLNESS: Samantha [name] is a 56 year old female with a past medical history of anxiety, depression, hyperlipidemia, CVA, sleep apnea, CKD stage III,   Past Medical History:  Diagnosis Date   Anxiety    Sheria Lang lesion, acute    Cerebral ventricular shunt fitting or adjustment    CKD (chronic kidney disease), stage III (HCC)    COMMON MIGRAINE 10/01/2007   Chronic     Depression    Esophageal ulceration    GERD (gastroesophageal reflux disease)    GI bleed    GLUCOSE INTOLERANCE 10/01/2007   Qualifier: Diagnosis of  By: Jonny Ruiz MD, Len Blalock    Hiatal hernia    Hydrocephalus (HCC)    Hyperlipidemia    HYPERLIPIDEMIA 10/01/2007   Chronic. Lovaza is too $$$ Declined statins due to worries    IDA (iron deficiency anemia)    Memory loss    Migraine    OBSTRUCTIVE SLEEP APNEA 10/01/2007   NPSG 2006:  AHI 9/hr Started cpap 2009 successfully.      Paresthesia    Prolonged QT interval    Schatzki's ring    Sleep apnea    Stroke (HCC) 03/14/2013   Past Surgical History:  Procedure Laterality Date   Ames Shunt  1976,   cerebral vascular shunt/ with a revision 1981   BIOPSY  05/02/2022   Procedure: BIOPSY;  Surgeon: Imogene Burn, MD;  Location: [facility] ENDOSCOPY;  Service: Gastroenterology;;   CHOLECYSTECTOMY  1981   CYST REMOVAL NECK     ESOPHAGOGASTRODUODENOSCOPY (EGD) WITH PROPOFOL N/A 05/02/2022   Procedure: ESOPHAGOGASTRODUODENOSCOPY (EGD) WITH PROPOFOL;  Surgeon: Imogene Burn, MD;  Location: [facility] ENDOSCOPY;  Service: Gastroenterology;  Laterality: N/A;   Pituitary cyst w/subsequent shunt     Social History:  Family History: Mother with history colon polyps, kidney cancer and Alzheimer's disease. Father with diabetes. Maternal grandmother with history of esophageal cancer.    reports that she has never smoked. She has never used smokeless tobacco. She reports current alcohol use of  about 1.0 standard drink of alcohol per week. She reports that she does not use drugs.   Allergies  Allergen Reactions   Alprazolam Other (See Comments)    Crying all the time   Citalopram Hydrobromide Other (See Comments)    low libido      Outpatient Encounter Medications as of 07/11/2022  Medication Sig   atorvastatin (LIPITOR) 20 MG tablet TAKE 1 TABLET EVERY DAY AT 6 PM   busPIRone (BUSPAR) 10 MG tablet TAKE 1 TABLET THREE TIMES DAILY (Patient taking differently: Take 10 mg by mouth 3 (three) times daily.)   Calcium Carbonate-Vitamin D (CALCIUM-VITAMIN D) 500-200 MG-UNIT per tablet Take 1 tablet by mouth 2 (two) times daily with a meal.   Docusate Calcium (STOOL SOFTENER PO) Take 1 capsule by mouth 2 (two) times daily.   donepezil (ARICEPT) 10 MG tablet TAKE 1 TABLET AT BEDTIME (Patient taking differently: Take 10 mg by mouth at bedtime.)   DULoxetine (CYMBALTA) 60 MG capsule TAKE 1 CAPSULE EVERY DAY (Patient taking differently: Take 60 mg by mouth daily.)   Ferric Maltol (ACCRUFER) 30 MG CAPS 1 po bid   gabapentin (NEURONTIN) 300 MG capsule Take 300 mg by mouth 3 (three) times daily.   lactulose (CHRONULAC) 10 GM/15ML solution Take 30-45 mLs (20-30 g total) by mouth 2 (two) times daily as needed for moderate constipation  or severe constipation.   lamoTRIgine (LAMICTAL) 100 MG tablet TAKE 1 TABLET EVERY MORNING, AND 1 AND 1/2 TABLETS AT NIGHT.   lidocaine (LIDODERM) 5 % Place 1 patch onto the skin daily. Remove & Discard patch within 12 hours or as directed by MD   LINZESS 290 MCG CAPS capsule TAKE 1 CAPSULE  DAILY BEFORE BREAKFAST (Patient taking differently: Take 290 mcg by mouth daily before breakfast.)   memantine (NAMENDA) 5 MG tablet TAKE 1 TABLET TWICE DAILY   morphine (MS CONTIN) 30 MG 12 hr tablet Take 1 tablet (30 mg total) by mouth every 12 (twelve) hours.   morphine (MS CONTIN) 30 MG 12 hr tablet Take 1 tablet (30 mg total) by mouth every 12 (twelve) hours.   naloxone  (NARCAN) nasal spray 4 mg/0.1 mL Place 1 spray into the nose once.   olmesartan (BENICAR) 20 MG tablet Take 1 tablet (20 mg total) by mouth daily.   Oxycodone HCl 10 MG TABS Take 1 tablet (10 mg total) by mouth 3 (three) times daily as needed.   Oxycodone HCl 10 MG TABS Take 1 tablet (10 mg total) by mouth 3 (three) times daily as needed.   pantoprazole (PROTONIX) 40 MG tablet Take 1 tablet (40 mg total) by mouth 2 (two) times daily before a meal.   Probiotic Product (ALIGN) 4 MG CAPS Take 1 capsule (4 mg total) by mouth daily. (Patient taking differently: Take 0.5 capsules by mouth daily.)   promethazine (PHENERGAN) 12.5 MG tablet Take 1 tablet (12.5 mg total) by mouth every 6 (six) hours as needed for nausea or vomiting.   sucralfate (CARAFATE) 1 g tablet Take 1 tablet (1 g total) by mouth 4 (four) times daily for 28 days.   No facility-administered encounter medications on file as of 07/11/2022.     REVIEW OF SYSTEMS:  Gen: Denies fever, sweats or chills. No weight loss.  CV: Denies chest pain, palpitations or edema. Resp: Denies cough, shortness of breath of hemoptysis.  GI: Denies heartburn, dysphagia, stomach or lower abdominal pain. No diarrhea or constipation.  GU : Denies urinary burning, blood in urine, increased urinary frequency or incontinence. MS: Denies joint pain, muscles aches or weakness. Derm: Denies rash, itchiness, skin lesions or unhealing ulcers. Psych: Denies depression, anxiety, memory loss, suicidal ideation and confusion. Heme: Denies bruising, easy bleeding. Neuro:  Denies headaches, dizziness or paresthesias. Endo:  Denies any problems with DM, thyroid or adrenal function.  PHYSICAL EXAM: There were no vitals taken for this visit. General: Well developed ... in no acute distress. Head: Normocephalic and atraumatic. Eyes:  Sclerae non-icteric, conjunctive pink. Ears: Normal auditory acuity. Mouth: Dentition intact. No ulcers or lesions.  Neck: Supple, no  lymphadenopathy or thyromegaly.  Lungs: Clear bilaterally to auscultation without wheezes, crackles or rhonchi. Heart: Regular rate and rhythm. No murmur, rub or gallop appreciated.  Abdomen: Soft, nontender, non distended. No masses. No hepatosplenomegaly. Normoactive bowel sounds x 4 quadrants.  Rectal:  Musculoskeletal: Symmetrical with no gross deformities. Skin: Warm and dry. No rash or lesions on visible extremities. Extremities: No edema. Neurological: Alert oriented x 4, no focal deficits.  Psychological:  Alert and cooperative. Normal mood and affect.  ASSESSMENT AND PLAN:    CC:  Plotnikov, Georgina Quint, MD

## 2022-07-11 ENCOUNTER — Ambulatory Visit: Payer: Medicare HMO | Admitting: Nurse Practitioner

## 2022-07-13 DIAGNOSIS — M9902 Segmental and somatic dysfunction of thoracic region: Secondary | ICD-10-CM | POA: Diagnosis not present

## 2022-07-13 DIAGNOSIS — M9901 Segmental and somatic dysfunction of cervical region: Secondary | ICD-10-CM | POA: Diagnosis not present

## 2022-07-13 DIAGNOSIS — M9903 Segmental and somatic dysfunction of lumbar region: Secondary | ICD-10-CM | POA: Diagnosis not present

## 2022-07-13 DIAGNOSIS — M9905 Segmental and somatic dysfunction of pelvic region: Secondary | ICD-10-CM | POA: Diagnosis not present

## 2022-07-20 ENCOUNTER — Other Ambulatory Visit: Payer: Self-pay | Admitting: Internal Medicine

## 2022-07-25 DIAGNOSIS — M9905 Segmental and somatic dysfunction of pelvic region: Secondary | ICD-10-CM | POA: Diagnosis not present

## 2022-07-25 DIAGNOSIS — M9903 Segmental and somatic dysfunction of lumbar region: Secondary | ICD-10-CM | POA: Diagnosis not present

## 2022-07-25 DIAGNOSIS — M9902 Segmental and somatic dysfunction of thoracic region: Secondary | ICD-10-CM | POA: Diagnosis not present

## 2022-07-25 DIAGNOSIS — M9901 Segmental and somatic dysfunction of cervical region: Secondary | ICD-10-CM | POA: Diagnosis not present

## 2022-07-27 ENCOUNTER — Other Ambulatory Visit: Payer: Self-pay | Admitting: *Deleted

## 2022-07-27 DIAGNOSIS — M25561 Pain in right knee: Secondary | ICD-10-CM | POA: Diagnosis not present

## 2022-07-27 DIAGNOSIS — M47812 Spondylosis without myelopathy or radiculopathy, cervical region: Secondary | ICD-10-CM | POA: Diagnosis not present

## 2022-07-27 DIAGNOSIS — M7061 Trochanteric bursitis, right hip: Secondary | ICD-10-CM | POA: Diagnosis not present

## 2022-07-27 MED ORDER — OLMESARTAN MEDOXOMIL 20 MG PO TABS: 20.0000 mg | ORAL_TABLET | Freq: Every day | ORAL | 3 refills | Status: DC

## 2022-07-28 ENCOUNTER — Encounter: Payer: Self-pay | Admitting: Internal Medicine

## 2022-07-28 ENCOUNTER — Ambulatory Visit (INDEPENDENT_AMBULATORY_CARE_PROVIDER_SITE_OTHER): Payer: Medicare HMO | Admitting: Internal Medicine

## 2022-07-28 DIAGNOSIS — D509 Iron deficiency anemia, unspecified: Secondary | ICD-10-CM

## 2022-07-28 DIAGNOSIS — M79605 Pain in left leg: Secondary | ICD-10-CM

## 2022-07-28 DIAGNOSIS — R5382 Chronic fatigue, unspecified: Secondary | ICD-10-CM

## 2022-07-28 DIAGNOSIS — R413 Other amnesia: Secondary | ICD-10-CM

## 2022-07-28 DIAGNOSIS — M545 Low back pain, unspecified: Secondary | ICD-10-CM | POA: Diagnosis not present

## 2022-07-28 DIAGNOSIS — G8929 Other chronic pain: Secondary | ICD-10-CM

## 2022-07-28 DIAGNOSIS — Z23 Encounter for immunization: Secondary | ICD-10-CM | POA: Diagnosis not present

## 2022-07-28 DIAGNOSIS — I10 Essential (primary) hypertension: Secondary | ICD-10-CM

## 2022-07-28 DIAGNOSIS — K5901 Slow transit constipation: Secondary | ICD-10-CM | POA: Diagnosis not present

## 2022-07-28 MED ORDER — OXYCODONE HCL 10 MG PO TABS: 10.0000 mg | ORAL_TABLET | Freq: Three times a day (TID) | ORAL | 0 refills | Status: DC | PRN

## 2022-07-28 MED ORDER — MORPHINE SULFATE ER 30 MG PO TBCR: 30.0000 mg | EXTENDED_RELEASE_TABLET | Freq: Two times a day (BID) | ORAL | 0 refills | Status: DC

## 2022-07-28 NOTE — Assessment & Plan Note (Signed)
CFS - post-CVA

## 2022-07-28 NOTE — Assessment & Plan Note (Signed)
On Aricept 10 mg/d Worse. Will add Namenda Walk more Valerian root for insomnia

## 2022-07-28 NOTE — Assessment & Plan Note (Signed)
Monitor CBC 

## 2022-07-28 NOTE — Progress Notes (Signed)
Subjective:  Patient ID: Samantha [name], female    DOB: Feb 03, 1966  Age: 56 y.o. MRN: [id number]  CC: Follow-up (2 month f/u- Flu shot)   HPI OTHELLO SGROI presents for LBP, constipation, depression  Outpatient Medications Prior to Visit  Medication Sig Dispense Refill   atorvastatin (LIPITOR) 20 MG tablet TAKE 1 TABLET EVERY DAY AT 6 PM 90 tablet 3   busPIRone (BUSPAR) 10 MG tablet TAKE 1 TABLET THREE TIMES DAILY (Patient taking differently: Take 10 mg by mouth 3 (three) times daily.) 270 tablet 1   Calcium Carbonate-Vitamin D (CALCIUM-VITAMIN D) 500-200 MG-UNIT per tablet Take 1 tablet by mouth 2 (two) times daily with a meal.     Docusate Calcium (STOOL SOFTENER PO) Take 1 capsule by mouth 2 (two) times daily.     donepezil (ARICEPT) 10 MG tablet TAKE 1 TABLET AT BEDTIME (Patient taking differently: Take 10 mg by mouth at bedtime.) 90 tablet 3   DULoxetine (CYMBALTA) 60 MG capsule TAKE 1 CAPSULE EVERY DAY (Patient taking differently: Take 60 mg by mouth daily.) 90 capsule 3   Ferric Maltol (ACCRUFER) 30 MG CAPS 1 po bid 60 capsule 5   gabapentin (NEURONTIN) 300 MG capsule Take 300 mg by mouth 3 (three) times daily.     lactulose (CHRONULAC) 10 GM/15ML solution Take 30-45 mLs (20-30 g total) by mouth 2 (two) times daily as needed for moderate constipation or severe constipation. 946 mL 5   lamoTRIgine (LAMICTAL) 100 MG tablet TAKE 1 TABLET EVERY MORNING, AND 1 AND 1/2 TABLETS AT NIGHT. 225 tablet 1   lidocaine (LIDODERM) 5 % Place 1 patch onto the skin daily. Remove & Discard patch within 12 hours or as directed by MD 180 patch 1   linaclotide (LINZESS) 290 MCG CAPS capsule TAKE 1 CAPSULE DAILY BEFORE BREAKFAST 90 capsule 2   memantine (NAMENDA) 5 MG tablet TAKE 1 TABLET TWICE DAILY 180 tablet 3   naloxone (NARCAN) nasal spray 4 mg/0.1 mL Place 1 spray into the nose once.     olmesartan (BENICAR) 20 MG tablet Take 1 tablet (20 mg total) by mouth daily. 90 tablet 3   pantoprazole  (PROTONIX) 40 MG tablet Take 1 tablet (40 mg total) by mouth 2 (two) times daily before a meal. 180 tablet 0   Probiotic Product (ALIGN) 4 MG CAPS Take 1 capsule (4 mg total) by mouth daily. (Patient taking differently: Take 0.5 capsules by mouth daily.) 90 capsule 1   promethazine (PHENERGAN) 12.5 MG tablet Take 1 tablet (12.5 mg total) by mouth every 6 (six) hours as needed for nausea or vomiting. 40 tablet 0   morphine (MS CONTIN) 30 MG 12 hr tablet Take 1 tablet (30 mg total) by mouth every 12 (twelve) hours. 60 tablet 0   Oxycodone HCl 10 MG TABS Take 1 tablet (10 mg total) by mouth 3 (three) times daily as needed. 90 tablet 0   sucralfate (CARAFATE) 1 g tablet Take 1 tablet (1 g total) by mouth 4 (four) times daily for 28 days. 112 tablet 0   morphine (MS CONTIN) 30 MG 12 hr tablet Take 1 tablet (30 mg total) by mouth every 12 (twelve) hours. 60 tablet 0   Oxycodone HCl 10 MG TABS Take 1 tablet (10 mg total) by mouth 3 (three) times daily as needed. 90 tablet 0   No facility-administered medications prior to visit.    ROS: Review of Systems  Constitutional:  Negative for activity change, appetite change, chills,  fatigue and unexpected weight change.  HENT:  Negative for congestion, mouth sores and sinus pressure.   Eyes:  Negative for visual disturbance.  Respiratory:  Negative for cough and chest tightness.   Gastrointestinal:  Negative for abdominal pain and nausea.  Genitourinary:  Negative for difficulty urinating, frequency and vaginal pain.  Musculoskeletal:  Positive for back pain. Negative for gait problem.  Skin:  Negative for pallor and rash.  Neurological:  Negative for dizziness, tremors, weakness, numbness and headaches.  Psychiatric/Behavioral:  Negative for confusion and sleep disturbance.     Objective:  BP (!) 148/92 (BP Location: Left Arm)   Pulse 98   Temp 99.4 F (37.4 C) (Oral)   Ht 4\' 10"  (1.473 m)   Wt 146 lb 6.4 oz (66.4 kg)   SpO2 96%   BMI 30.60 kg/m    BP Readings from Last 3 Encounters:  07/28/22 (!) 148/92  05/26/22 130/82  05/12/22 132/90    Wt Readings from Last 3 Encounters:  07/28/22 146 lb 6.4 oz (66.4 kg)  05/26/22 151 lb (68.5 kg)  05/12/22 149 lb (67.6 kg)    Physical Exam Constitutional:      General: She is not in acute distress.    Appearance: She is well-developed. She is obese.  HENT:     Head: Normocephalic.     Right Ear: External ear normal.     Left Ear: External ear normal.     Nose: Nose normal.  Eyes:     General:        Right eye: No discharge.        Left eye: No discharge.     Conjunctiva/sclera: Conjunctivae normal.     Pupils: Pupils are equal, round, and reactive to light.  Neck:     Thyroid: No thyromegaly.     Vascular: No JVD.     Trachea: No tracheal deviation.  Cardiovascular:     Rate and Rhythm: Normal rate and regular rhythm.     Heart sounds: Normal heart sounds.  Pulmonary:     Effort: No respiratory distress.     Breath sounds: No stridor. No wheezing.  Abdominal:     General: Bowel sounds are normal. There is no distension.     Palpations: Abdomen is soft. There is no mass.     Tenderness: There is no abdominal tenderness. There is no guarding or rebound.  Musculoskeletal:        General: No tenderness.     Cervical back: Normal range of motion and neck supple. No rigidity.  Lymphadenopathy:     Cervical: No cervical adenopathy.  Skin:    Findings: No erythema or rash.  Neurological:     Cranial Nerves: No cranial nerve deficit.     Motor: No abnormal muscle tone.     Coordination: Coordination normal.     Deep Tendon Reflexes: Reflexes normal.  Psychiatric:        Behavior: Behavior normal.        Thought Content: Thought content normal.        Judgment: Judgment normal.     Lab Results  Component Value Date   WBC 5.2 05/25/2022   HGB 10.6 (L) 05/25/2022   HCT 33.1 (L) 05/25/2022   PLT 421.0 (H) 05/25/2022   GLUCOSE 99 05/25/2022   CHOL 217 (H)  09/02/2021   TRIG 204.0 (H) 09/02/2021   HDL 52.80 09/02/2021   LDLDIRECT 120.0 09/02/2021   LDLCALC 85 07/16/2020   ALT 14 05/25/2022  AST 22 05/25/2022   NA 141 05/25/2022   K 4.1 05/25/2022   CL 107 05/25/2022   CREATININE 1.16 05/25/2022   BUN 9 05/25/2022   CO2 31 05/25/2022   TSH 3.09 09/02/2021   INR 1.0 05/02/2022   HGBA1C 5.7 01/12/2022    CT Cervical Spine Wo Contrast  Result Date: 05/01/2022 CLINICAL DATA:  Neck trauma, midline tenderness (Age 31-64y) fall EXAM: CT CERVICAL SPINE WITHOUT CONTRAST TECHNIQUE: Multidetector CT imaging of the cervical spine was performed without intravenous contrast. Multiplanar CT image reconstructions were also generated. RADIATION DOSE REDUCTION: This exam was performed according to the departmental dose-optimization program which includes automated exposure control, adjustment of the mA and/or kV according to patient size and/or use of iterative reconstruction technique. COMPARISON:  MRI cervical spine 03/02/2013 FINDINGS: Alignment: Normal. Skull base and vertebrae: Prominent left C5 transverse foramina likely due to a tortuous vertebral artery. No acute fracture. No aggressive appearing focal osseous lesion or focal pathologic process. Soft tissues and spinal canal: No prevertebral fluid or swelling. No visible canal hematoma. Upper chest: Unremarkable. Other: None. IMPRESSION: No acute displaced fracture or traumatic listhesis of the cervical spine. Electronically Signed   By: Tish Frederickson M.D.   On: 05/01/2022 22:21   CT Head Wo Contrast  Result Date: 05/01/2022 CLINICAL DATA:  Mental status change, unknown cause EXAM: CT HEAD WITHOUT CONTRAST TECHNIQUE: Contiguous axial images were obtained from the base of the skull through the vertex without intravenous contrast. RADIATION DOSE REDUCTION: This exam was performed according to the departmental dose-optimization program which includes automated exposure control, adjustment of the mA and/or  kV according to patient size and/or use of iterative reconstruction technique. COMPARISON:  CT head 10/21/2015 BRAIN: BRAIN Similar-appearing right posterior porch ventriculoperitoneal shunt with tip again noted to terminate within the inferior horn of the right lateral ventricle. Right frontotemporal encephalomalacia. No evidence of large-territorial acute infarction. No parenchymal hemorrhage. No mass lesion. No extra-axial collection. No mass effect or midline shift. Similar appearance of the lateral ventricles and third ventricle. Basilar cisterns are patent. Vascular: No hyperdense vessel. Vascular clipping again noted at the central skull base. Skull: No acute fracture or focal lesion.  Right frontal craniotomy. Sinuses/Orbits: Paranasal sinuses and mastoid air cells are clear. The orbits are unremarkable. Other: None. IMPRESSION: No acute intracranial abnormality in a patient with known right posterior approach ventriculoperitoneal shunt. Stable size of lateral and third ventricles compared to 2016. Electronically Signed   By: Tish Frederickson M.D.   On: 05/01/2022 21:33   DG Chest Portable 1 View  Result Date: 05/01/2022 CLINICAL DATA:  Altered mental status EXAM: PORTABLE CHEST 1 VIEW COMPARISON:  08/29/2018 FINDINGS: Mild cardiomegaly. Small hiatal hernia. No focal airspace consolidation, pleural effusion, or pneumothorax. Right-sided VP shunt catheter with similar degree of irregularity of the catheter contours within the chest. Possible catheter discontinuity the level of the right sixth rib. IMPRESSION: 1. No acute cardiopulmonary disease. 2. Small hiatal hernia. 3. Possible VP shunt catheter discontinuity within the mid chest. Electronically Signed   By: Duanne Guess D.O.   On: 05/01/2022 21:22    Assessment & Plan:   Problem List Items Addressed This Visit     Anemia    Monitor CBC      Chronic pain    Chronic  Potential benefits of a long term benzodiazepines  use as well as  potential risks  and complications were explained to the patient and were aknowledged. Cymbalta, Buspar  Relevant Medications   morphine (MS CONTIN) 30 MG 12 hr tablet   Oxycodone HCl 10 MG TABS   Oxycodone HCl 10 MG TABS   morphine (MS CONTIN) 30 MG 12 hr tablet   Constipation    Linzess +/- Miralax Lactulose - too sweet..      Fatigue    CFS - post-CVA      HTN (hypertension)    Start Benicar 20 mg/d NAS diet Walk more      Low back pain radiating to left lower extremity    Chronic pain in the back, hips - I took over [name]'s pain management (she was treated at [facility] in the past). On Oxycodone and MS Contin   Potential benefits of a long term opioids use as well as potential risks (i.e. addiction risk, apnea etc) and complications (i.e. Somnolence, constipation and others) were explained to the patient and were aknowledged.      Relevant Medications   morphine (MS CONTIN) 30 MG 12 hr tablet   Oxycodone HCl 10 MG TABS   Oxycodone HCl 10 MG TABS   morphine (MS CONTIN) 30 MG 12 hr tablet   Memory loss    On Aricept 10 mg/d Worse. Will add Namenda Walk more Valerian root for insomnia         Meds ordered this encounter  Medications   morphine (MS CONTIN) 30 MG 12 hr tablet    Sig: Take 1 tablet (30 mg total) by mouth every 12 (twelve) hours.    Dispense:  60 tablet    Refill:  0    Please fill on or after 08/27/22   Oxycodone HCl 10 MG TABS    Sig: Take 1 tablet (10 mg total) by mouth 3 (three) times daily as needed.    Dispense:  90 tablet    Refill:  0    Please fill on or after 08/27/22   Oxycodone HCl 10 MG TABS    Sig: Take 1 tablet (10 mg total) by mouth 3 (three) times daily as needed.    Dispense:  90 tablet    Refill:  0    Please fill on or after 07/28/22   morphine (MS CONTIN) 30 MG 12 hr tablet    Sig: Take 1 tablet (30 mg total) by mouth every 12 (twelve) hours.    Dispense:  60 tablet    Refill:  0    Please fill on or  after 07/28/22      Follow-up: Return in about 2 months (around 09/27/2022) for a follow-up visit.  Sonda Primes, MD

## 2022-07-28 NOTE — Assessment & Plan Note (Signed)
Chronic  Potential benefits of a long term benzodiazepines  use as well as potential risks  and complications were explained to the patient and were aknowledged. Cymbalta, Buspar

## 2022-07-28 NOTE — Assessment & Plan Note (Signed)
Start Benicar 20 mg/d NAS diet Walk more

## 2022-07-28 NOTE — Assessment & Plan Note (Signed)
Linzess +/- Miralax Lactulose - too sweet.Marland Kitchen

## 2022-07-28 NOTE — Assessment & Plan Note (Signed)
Chronic pain in the back, hips - I took over [name]'s pain management (she was treated at [facility] in the past). On Oxycodone and MS Contin   Potential benefits of a long term opioids use as well as potential risks (i.e. addiction risk, apnea etc) and complications (i.e. Somnolence, constipation and others) were explained to the patient and were aknowledged. 

## 2022-07-29 ENCOUNTER — Telehealth: Payer: Self-pay | Admitting: Internal Medicine

## 2022-07-29 DIAGNOSIS — M9905 Segmental and somatic dysfunction of pelvic region: Secondary | ICD-10-CM | POA: Diagnosis not present

## 2022-07-29 DIAGNOSIS — M9902 Segmental and somatic dysfunction of thoracic region: Secondary | ICD-10-CM | POA: Diagnosis not present

## 2022-07-29 DIAGNOSIS — M9901 Segmental and somatic dysfunction of cervical region: Secondary | ICD-10-CM | POA: Diagnosis not present

## 2022-07-29 DIAGNOSIS — M9903 Segmental and somatic dysfunction of lumbar region: Secondary | ICD-10-CM | POA: Diagnosis not present

## 2022-07-29 NOTE — Telephone Encounter (Signed)
Called pt no answer LMOM RTC.../lmb 

## 2022-07-29 NOTE — Telephone Encounter (Signed)
Patient called and said she got the flu shot yesterday and today she is having a little bit of a prickly feeling in her arms and said sometimes its also in her legs. She said its not that bad but she didn't know if it was normal after a flu shot. Call back is [phone]

## 2022-08-09 DIAGNOSIS — M9902 Segmental and somatic dysfunction of thoracic region: Secondary | ICD-10-CM | POA: Diagnosis not present

## 2022-08-09 DIAGNOSIS — M9901 Segmental and somatic dysfunction of cervical region: Secondary | ICD-10-CM | POA: Diagnosis not present

## 2022-08-09 DIAGNOSIS — M9903 Segmental and somatic dysfunction of lumbar region: Secondary | ICD-10-CM | POA: Diagnosis not present

## 2022-08-09 DIAGNOSIS — M9905 Segmental and somatic dysfunction of pelvic region: Secondary | ICD-10-CM | POA: Diagnosis not present

## 2022-08-17 ENCOUNTER — Telehealth: Payer: Self-pay | Admitting: Nurse Practitioner

## 2022-08-17 ENCOUNTER — Ambulatory Visit: Payer: Medicare HMO | Admitting: Nurse Practitioner

## 2022-08-17 NOTE — Telephone Encounter (Signed)
Good afternoon Samantha [name],  Patient called stating that she needed to reschedule her appointment with you today at 3:00 with no reason given.   Patient was rescheduled for 11/9 at 2:30.

## 2022-08-29 ENCOUNTER — Telehealth: Payer: Self-pay | Admitting: Internal Medicine

## 2022-08-29 DIAGNOSIS — Z1231 Encounter for screening mammogram for malignant neoplasm of breast: Secondary | ICD-10-CM

## 2022-08-29 NOTE — Telephone Encounter (Signed)
LVM for pt to rtn my call to schedule AWV with [facility] call back # [phone] 

## 2022-08-29 NOTE — Telephone Encounter (Signed)
Patient would like to have a recommendation for a mammogram and one ordered.  She use to go to [facility] and they closed down.  Please advise.

## 2022-08-30 NOTE — Telephone Encounter (Signed)
Okay.  Thanks.

## 2022-08-30 NOTE — Telephone Encounter (Signed)
Called pt no answer LMOM MD place order for Mammo.Marland KitchenJohny Chess

## 2022-09-01 NOTE — Telephone Encounter (Signed)
Notified pt mammo was placed on 08/30/22. Will received a call to set appt up.Marland KitchenJohny [name]

## 2022-09-01 NOTE — Telephone Encounter (Signed)
Patient is calling in wanting to know where the mammogram was placed.

## 2022-09-05 ENCOUNTER — Ambulatory Visit (INDEPENDENT_AMBULATORY_CARE_PROVIDER_SITE_OTHER): Payer: Medicare HMO

## 2022-09-05 DIAGNOSIS — Z0000 Encounter for general adult medical examination without abnormal findings: Secondary | ICD-10-CM

## 2022-09-05 NOTE — Progress Notes (Addendum)
Subjective:   Samantha [name] is a 56 y.o. female who presents for Medicare Annual (Subsequent) preventive examination.  I connected with Samantha [name] today by telephone and verified that I am speaking with the correct person using two identifiers. I discussed the limitations, risks, security and privacy concerns of performing an evaluation and management service by telephone and the availability of in person appointments. I also discussed with the patient that there may be a patient responsible charge related to this service. The patient expressed understanding and agreed to proceed. Location patient: home Location provider: Pietro Cassis Persons participating in the visit: Samantha [name] and Samantha [name], Samantha [name].  Time Spent with patient on telephone encounter: 10 mins  Review of Systems    No ROS. Medicare Wellness Telephone Visit. Additional risk factors are reflected in social history. Cardiac Risk Factors include: sedentary lifestyle     Objective:    Today's Vitals   09/05/22 1305  PainSc: 5    There is no height or weight on file to calculate BMI.     09/05/2022    1:12 PM 05/02/2022    5:06 PM 05/01/2022    8:55 PM 09/02/2021   11:35 AM 08/29/2018    7:31 PM 10/27/2016    8:45 PM 04/28/2016    9:53 AM  Advanced Directives  Does Patient Have a Medical Advance Directive? Yes No;Yes No No No No No  Type of Paramedic of [place];Living will        Does patient want to make changes to medical advance directive? No - Patient declined        Copy of [place] in Chart? No - copy requested        Would patient like information on creating a medical advance directive?  No - Patient declined  No - Patient declined  No - Patient declined     Current Medications (verified) Outpatient Encounter Medications as of 09/05/2022  Medication Sig   atorvastatin (LIPITOR) 20 MG tablet TAKE 1 TABLET EVERY DAY AT 6 PM   busPIRone (BUSPAR) 10 MG  tablet TAKE 1 TABLET THREE TIMES DAILY (Patient taking differently: Take 10 mg by mouth 3 (three) times daily.)   Calcium Carbonate-Vitamin D (CALCIUM-VITAMIN D) 500-200 MG-UNIT per tablet Take 1 tablet by mouth 2 (two) times daily with a meal.   Docusate Calcium (STOOL SOFTENER PO) Take 1 capsule by mouth 2 (two) times daily.   donepezil (ARICEPT) 10 MG tablet TAKE 1 TABLET AT BEDTIME (Patient taking differently: Take 10 mg by mouth at bedtime.)   DULoxetine (CYMBALTA) 60 MG capsule TAKE 1 CAPSULE EVERY DAY (Patient taking differently: Take 60 mg by mouth daily.)   Ferric Maltol (ACCRUFER) 30 MG CAPS 1 po bid   gabapentin (NEURONTIN) 300 MG capsule Take 300 mg by mouth 3 (three) times daily.   lactulose (CHRONULAC) 10 GM/15ML solution Take 30-45 mLs (20-30 g total) by mouth 2 (two) times daily as needed for moderate constipation or severe constipation.   lamoTRIgine (LAMICTAL) 100 MG tablet TAKE 1 TABLET EVERY MORNING, AND 1 AND 1/2 TABLETS AT NIGHT.   lidocaine (LIDODERM) 5 % Place 1 patch onto the skin daily. Remove & Discard patch within 12 hours or as directed by MD   linaclotide (LINZESS) 290 MCG CAPS capsule TAKE 1 CAPSULE DAILY BEFORE BREAKFAST   memantine (NAMENDA) 5 MG tablet TAKE 1 TABLET TWICE DAILY   morphine (MS CONTIN) 30 MG 12 hr tablet Take 1 tablet (30  mg total) by mouth every 12 (twelve) hours.   morphine (MS CONTIN) 30 MG 12 hr tablet Take 1 tablet (30 mg total) by mouth every 12 (twelve) hours.   naloxone (NARCAN) nasal spray 4 mg/0.1 mL Place 1 spray into the nose once.   olmesartan (BENICAR) 20 MG tablet Take 1 tablet (20 mg total) by mouth daily.   Oxycodone HCl 10 MG TABS Take 1 tablet (10 mg total) by mouth 3 (three) times daily as needed.   Oxycodone HCl 10 MG TABS Take 1 tablet (10 mg total) by mouth 3 (three) times daily as needed.   Probiotic Product (ALIGN) 4 MG CAPS Take 1 capsule (4 mg total) by mouth daily. (Patient taking differently: Take 0.5 capsules by mouth  daily.)   promethazine (PHENERGAN) 12.5 MG tablet Take 1 tablet (12.5 mg total) by mouth every 6 (six) hours as needed for nausea or vomiting.   pantoprazole (PROTONIX) 40 MG tablet Take 1 tablet (40 mg total) by mouth 2 (two) times daily before a meal.   sucralfate (CARAFATE) 1 g tablet Take 1 tablet (1 g total) by mouth 4 (four) times daily for 28 days.   No facility-administered encounter medications on file as of 09/05/2022.    Allergies (verified) Alprazolam and Citalopram hydrobromide   History: Past Medical History:  Diagnosis Date   Anxiety    Lysbeth Galas lesion, acute    Cerebral ventricular shunt fitting or adjustment    CKD (chronic kidney disease), stage III ([place])    COMMON MIGRAINE 10/01/2007   Chronic     Depression    Esophageal ulceration    GERD (gastroesophageal reflux disease)    GI bleed    GLUCOSE INTOLERANCE 10/01/2007   Qualifier: Diagnosis of  By: Jenny Reichmann MD, Hunt Oris    Hiatal hernia    Hydrocephalus ([place])    Hyperlipidemia    HYPERLIPIDEMIA 10/01/2007   Chronic. Lovaza is too $$$ Declined statins due to worries    IDA (iron deficiency anemia)    Memory loss    Migraine    OBSTRUCTIVE SLEEP APNEA 10/01/2007   NPSG 2006:  AHI 9/hr Started cpap 2009 successfully.      Paresthesia    Prolonged QT interval    Schatzki's ring    Sleep apnea    Stroke ([place]) 03/14/2013   Past Surgical History:  Procedure Laterality Date   [place],   cerebral vascular shunt/ with a revision 1981   BIOPSY  05/02/2022   Procedure: BIOPSY;  Surgeon: Sharyn Creamer, MD;  Location: [facility] ENDOSCOPY;  Service: Gastroenterology;;   CHOLECYSTECTOMY  1981   CYST REMOVAL NECK     ESOPHAGOGASTRODUODENOSCOPY (EGD) WITH PROPOFOL N/A 05/02/2022   Procedure: ESOPHAGOGASTRODUODENOSCOPY (EGD) WITH PROPOFOL;  Surgeon: Sharyn Creamer, MD;  Location: [place];  Service: Gastroenterology;  Laterality: N/A;   Pituitary cyst w/subsequent shunt     Family History  Problem Relation Age  of Onset   Dementia Mother    Kidney cancer Mother    Alzheimer's disease Mother    Migraines Mother    Colon polyps Mother    Heart disease Father    Diabetes Father    Melanoma Other    Lung cancer Other    Lung cancer Other    Brain cancer Maternal Aunt    Lung cancer Maternal Uncle    Esophageal cancer Maternal Grandmother    Alzheimer's disease Paternal Grandmother    Colon cancer Neg Hx    Social  History   Socioeconomic History   Marital status: Married    Spouse name: Elta Guadeloupe   Number of children: 0   Years of education: 12   Highest education level: Not on file  Occupational History   Occupation: [employer]    Comment: Therapist, art Rep   Occupation: disabled  Tobacco Use   Smoking status: Never   Smokeless tobacco: Never  Substance and Sexual Activity   Alcohol use: Yes    Alcohol/week: 1.0 standard drink of alcohol    Types: 1 Glasses of wine per week    Comment: Social   Drug use: No   Sexual activity: Yes  Other Topics Concern   Not on file  Social History Narrative   Mother is in a NH.    Patient lives at home with her husband Elta Guadeloupe). 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    High school education.   Right handed.   Caffeine 1 cup daily.   She has no children   Social Determinants of Health   Financial Resource Strain: Low Risk  (09/05/2022)   Overall Financial Resource Strain (CARDIA)    Difficulty of Paying Living Expenses: Not hard at all  Food Insecurity: No Food Insecurity (09/05/2022)   Hunger Vital Sign    Worried About Running Out of Food in the Last Year: Never true    Ran Out of Food in the Last Year: Never true  Transportation Needs: No Transportation Needs (09/05/2022)   PRAPARE - Hydrologist (Medical): No    Lack of Transportation (Non-Medical): No  Physical Activity: Inactive (09/05/2022)   Exercise Vital Sign    Days of Exercise per Week: 0 days    Minutes of Exercise per Session: 0 min   Stress: No Stress Concern Present (09/05/2022)   [place]    Feeling of Stress : Not at all  Social Connections: Moderately Isolated (09/05/2022)   Social Connection and Isolation Panel [NHANES]    Frequency of Communication with Friends and Family: More than three times a week    Frequency of Social Gatherings with Friends and Family: More than three times a week    Attends Religious Services: Never    Marine scientist or Organizations: No    Attends Music therapist: Never    Marital Status: Married    Tobacco Counseling Counseling given: Not Answered   Clinical Intake:  Pre-visit preparation completed: Yes  Pain : 0-10 Pain Score: 5  Pain Type: Chronic pain Pain Location: Back Pain Orientation: Lower Pain Descriptors / Indicators: Aching Pain Onset: More than a month ago Pain Frequency: Intermittent Pain Relieving Factors: medication  Pain Relieving Factors: medication  Nutritional Risks: None Diabetes: No  How often do you need to have someone help you when you read instructions, pamphlets, or other written materials from your doctor or pharmacy?: 1 - Never What is the last grade level you completed in school?: high school   Interpreter Needed?: No  Information entered by :: Samantha [name], [place]   Activities of Daily Living    09/05/2022    1:17 PM 05/02/2022    5:51 PM  In your present state of health, do you have any difficulty performing the following activities:  Hearing? 0 0  Vision? 0 0  Difficulty concentrating or making decisions? 1 0  Walking or climbing stairs? 0 0  Dressing or bathing? 0 0  Doing errands,  shopping? 0 0  Preparing Food and eating ? N   Using the Toilet? N   In the past six months, have you accidently leaked urine? N   Do you have problems with loss of bowel control? N   Managing your Medications? N   Managing your Finances? N    Housekeeping or managing your Housekeeping? N     Patient Care Team: Plotnikov, Evie Lacks, MD as PCP - General (Internal Medicine) Plotnikov, Evie Lacks, MD as Referring Physician (Internal Medicine) Delila Pereyra, MD (Obstetrics and Gynecology) Marcial Pacas, MD as Consulting Physician (Neurology) Chesley Mires, MD as Consulting Physician (Pulmonary Disease) Luna Kitchens., MD as Referring Physician (Neurosurgery) Almyra Free, MD as Referring Physician (Physical Medicine and Rehabilitation) Marica Otter, OD as Consulting Physician (Optometry)  Indicate any recent Medical Services you may have received from other than [facility] providers in the past year (date may be approximate).     Assessment:   This is a routine wellness examination for Nylayah.  Hearing/Vision screen Patient denied any hearing difficulty. No hearing aids. Patient does wear corrective lenses.  Dietary issues and exercise activities discussed: Current Exercise Habits: The patient does not participate in regular exercise at present, Exercise limited by: orthopedic condition(s)   Goals Addressed             This Visit's Progress    Patient Stated       I would like to work on losing weight.       Depression Screen    09/05/2022    1:11 PM 01/12/2022    2:40 PM 09/02/2021   11:33 AM 09/11/2020    3:23 PM 04/28/2016   10:00 AM  PHQ 2/9 Scores  PHQ - 2 Score 0 5 0 2 1  PHQ- 9 Score    9 11    Fall Risk    09/05/2022    1:15 PM 01/12/2022    2:40 PM 09/02/2021   11:36 AM 09/11/2020    3:23 PM 04/28/2016   10:00 AM  Fall Risk   Falls in the past year? 0 0 0 0 No  Number falls in past yr: 0 0 0    Injury with Fall? 0 0 0    Risk for fall due to : No Fall Risks  No Fall Risks    Follow up Falls evaluation completed  Falls evaluation completed      [place]:  Any stairs in or around the home? No  If so, are there any without handrails?  N/A Home free of  loose throw rugs in walkways, pet beds, electrical cords, etc? Yes  Adequate lighting in your home to reduce risk of falls? Yes   ASSISTIVE DEVICES UTILIZED TO PREVENT FALLS:  Life alert? No  Use of a cane, walker or w/c? No  Grab bars in the bathroom? Yes  Shower chair or bench in shower? No  Elevated toilet seat or a handicapped toilet? No   TIMED UP AND GO:  Was the test performed? No this was a phone visit.  Length of time to ambulate 10 feet: N/A sec.   Patient stated that she has no issues with gait or balance; does not use any assistive devices.  Cognitive Function: Patient is cogitatively intact.    03/18/2015   11:35 AM 12/18/2014   11:25 AM  MMSE - Mini Mental State Exam  Orientation to time 5 5  Orientation to Place 5 5  Registration 3 3  Attention/ Calculation 5 5  Recall 2 2  Language- name 2 objects 2 2  Language- repeat 1 1  Language- follow 3 step command 3 3  Language- read & follow direction 1 1  Write a sentence 1 1  Copy design 1 1  Total score 29 29        09/05/2022    1:18 PM  6CIT Screen  What Year? 0 points  What month? 0 points  What time? 0 points  Count back from 20 0 points  Months in reverse 0 points  Repeat phrase 0 points  Total Score 0 points    Immunizations Immunization History  Administered Date(s) Administered   Influenza Whole 09/06/2012   Influenza,inj,Quad PF,6+ Mos 08/08/2013, 07/07/2014, 08/27/2015, 08/22/2016, 09/02/2021, 07/28/2022   PFIZER(Purple Top)SARS-COV-2 Vaccination 02/07/2020, 03/02/2020   Pneumococcal Conjugate-13 12/26/2016   Tdap 01/23/2015    TDAP status: Up to date  Flu Vaccine status: Up to date  Pneumococcal vaccine status: N/A  Covid-19 vaccine status: Completed vaccines  Qualifies for Shingles Vaccine? Yes   Zostavax completed No   Shingrix Completed?: No.    Education has been provided regarding the importance of this vaccine. Patient has been advised to call insurance company to  determine out of pocket expense if they have not yet received this vaccine. Advised may also receive vaccine at local pharmacy or Health Dept. Verbalized acceptance and understanding.  Screening Tests Health Maintenance  Topic Date Due   Hepatitis C Screening  Never done   PAP SMEAR-Modifier  09/06/2019   MAMMOGRAM  07/09/2022   COVID-19 Vaccine (3 - Pfizer risk series) 09/21/2022 (Originally 03/30/2020)   COLONOSCOPY (Pts 45-82yrs Insurance coverage will need to be confirmed)  10/29/2024   TETANUS/TDAP  01/22/2025   INFLUENZA VACCINE  Completed   HIV Screening  Completed   HPV VACCINES  Aged Out   Zoster Vaccines- Shingrix  Discontinued    Health Maintenance  Health Maintenance Due  Topic Date Due   Hepatitis C Screening  Never done   PAP SMEAR-Modifier  09/06/2019   MAMMOGRAM  07/09/2022    Colorectal cancer screening: Type of screening: Colonoscopy. Completed 10/29/2014. Repeat every 10 years  Mammogram status: Ordered , scheduled for 10/25/2022. Pt provided with contact info and advised to call to schedule appt.    Lung Cancer Screening: (Low Dose CT Chest recommended if Age 39-80 years, 30 pack-year currently smoking OR have quit w/in 15years.) does not qualify.   Lung Cancer Screening Referral: N/A  Additional Screening:  Hepatitis C Screening: does qualify; Completed N/A  Vision Screening: Recommended annual ophthalmology exams for early detection of glaucoma and other disorders of the eye. Is the patient up to date with their annual eye exam?  Yes  Who is the provider or what is the name of the office in which the patient attends annual eye exams? [facility] If pt is not established with a provider, would they like to be referred to a provider to establish care? No .   Dental Screening: Recommended annual dental exams for proper oral hygiene  Community Resource Referral / Chronic Care Management: CRR required this visit?  No   CCM required this visit?  No       Plan:     I have personally reviewed and noted the following in the patient's chart:   Medical and social history Use of alcohol, tobacco or illicit drugs  Current medications and supplements including opioid prescriptions. Patient is currently  taking opioid prescriptions. Information provided to patient regarding non-opioid alternatives. Patient advised to discuss non-opioid treatment plan with their provider. Functional ability and status Nutritional status Physical activity Advanced directives List of other physicians Hospitalizations, surgeries, and ER visits in previous 12 months Vitals Screenings to include cognitive, depression, and falls Referrals and appointments  In addition, I have reviewed and discussed with patient certain preventive protocols, quality metrics, and best practice recommendations. A written personalized care plan for preventive services as well as general preventive health recommendations were provided to patient.     Rossie Muskrat, [place]   09/05/2022   Nurse Notes: N/A  Medical screening examination/treatment/procedure(s) were performed by non-physician practitioner and as supervising physician I was immediately available for consultation/collaboration.  I agree with above. Lew Dawes, MD

## 2022-09-05 NOTE — Patient Instructions (Signed)
It was great speaking with you today!  Please schedule your next Medicare Wellness Visit with your Nurse Health Advisor in 1 year by calling [phone]. 

## 2022-09-20 ENCOUNTER — Telehealth: Payer: Self-pay | Admitting: Internal Medicine

## 2022-09-20 NOTE — Telephone Encounter (Signed)
Patient called and wanted to know if she has gotten the Shingles vaccine because the pharmacy was offering it for free and she couldn't remember. Please let patient know by calling back at [phone]

## 2022-09-20 NOTE — Telephone Encounter (Signed)
Called pt per chant have not had shingles done with [place]. If plan on getting at pharmacy have them to send [place] documentation to we can document your chart. Pt understood.Marland KitchenJohny Chess

## 2022-09-22 ENCOUNTER — Ambulatory Visit: Payer: Medicare HMO | Admitting: Nurse Practitioner

## 2022-09-22 ENCOUNTER — Encounter: Payer: Self-pay | Admitting: Nurse Practitioner

## 2022-09-22 ENCOUNTER — Other Ambulatory Visit (INDEPENDENT_AMBULATORY_CARE_PROVIDER_SITE_OTHER): Payer: Medicare HMO

## 2022-09-22 VITALS — BP 130/98 | HR 96 | Ht <= 58 in | Wt 144.3 lb

## 2022-09-22 DIAGNOSIS — K253 Acute gastric ulcer without hemorrhage or perforation: Secondary | ICD-10-CM | POA: Diagnosis not present

## 2022-09-22 DIAGNOSIS — K2210 Ulcer of esophagus without bleeding: Secondary | ICD-10-CM | POA: Diagnosis not present

## 2022-09-22 DIAGNOSIS — D509 Iron deficiency anemia, unspecified: Secondary | ICD-10-CM

## 2022-09-22 DIAGNOSIS — K449 Diaphragmatic hernia without obstruction or gangrene: Secondary | ICD-10-CM

## 2022-09-22 LAB — CBC WITH DIFFERENTIAL/PLATELET
Basophils Absolute: 0.1 10*3/uL (ref 0.0–0.1)
Basophils Relative: 0.9 % (ref 0.0–3.0)
Eosinophils Absolute: 0.1 10*3/uL (ref 0.0–0.7)
Eosinophils Relative: 0.9 % (ref 0.0–5.0)
HCT: 38 % (ref 36.0–46.0)
Hemoglobin: 12.9 g/dL (ref 12.0–15.0)
Lymphocytes Relative: 21.1 % (ref 12.0–46.0)
Lymphs Abs: 1.8 10*3/uL (ref 0.7–4.0)
MCHC: 33.9 g/dL (ref 30.0–36.0)
MCV: 92 fl (ref 78.0–100.0)
Monocytes Absolute: 0.7 10*3/uL (ref 0.1–1.0)
Monocytes Relative: 7.8 % (ref 3.0–12.0)
Neutro Abs: 5.9 10*3/uL (ref 1.4–7.7)
Neutrophils Relative %: 69.3 % (ref 43.0–77.0)
Platelets: 550 10*3/uL — ABNORMAL HIGH (ref 150.0–400.0)
RBC: 4.13 Mil/uL (ref 3.87–5.11)
RDW: 14.8 % (ref 11.5–15.5)
WBC: 8.5 10*3/uL (ref 4.0–10.5)

## 2022-09-22 LAB — COMPREHENSIVE METABOLIC PANEL
ALT: 16 U/L (ref 0–35)
AST: 19 U/L (ref 0–37)
Albumin: 4.6 g/dL (ref 3.5–5.2)
Alkaline Phosphatase: 74 U/L (ref 39–117)
BUN: 10 mg/dL (ref 6–23)
CO2: 30 mEq/L (ref 19–32)
Calcium: 9.8 mg/dL (ref 8.4–10.5)
Chloride: 102 mEq/L (ref 96–112)
Creatinine, Ser: 1.03 mg/dL (ref 0.40–1.20)
GFR: 59.96 mL/min — ABNORMAL LOW (ref >60.00)
Glucose, Bld: 97 mg/dL (ref 70–99)
Potassium: 4.2 mEq/L (ref 3.5–5.1)
Sodium: 138 mEq/L (ref 135–145)
Total Bilirubin: 0.4 mg/dL (ref 0.2–1.2)
Total Protein: 7.7 g/dL (ref 6.0–8.3)

## 2022-09-22 LAB — IBC + FERRITIN
Ferritin: 25.3 ng/mL (ref 10.0–291.0)
Iron: 60 ug/dL (ref 42–145)
Saturation Ratios: 14.7 % — ABNORMAL LOW (ref 20.0–50.0)
TIBC: 407.4 ug/dL (ref 250.0–450.0)
Transferrin: 291 mg/dL (ref 212.0–360.0)

## 2022-09-22 MED ORDER — NA SULFATE-K SULFATE-MG SULF 17.5-3.13-1.6 GM/177ML PO SOLN: 1.0000 | Freq: Once | ORAL | 0 refills | Status: AC

## 2022-09-22 NOTE — Progress Notes (Signed)
.    09/23/2022 Samantha [name] [id number] August 31, 1966   CHIEF COMPLAINT: Esophageal ulcers, hospital follow up  HISTORY OF PRESENT ILLNESS:  Samantha [name] is a 56 year old female with a past medical history significant for anxiety, depression, hypertension, hyperlipidemia, CVA, sleep apnea uses CPAP, pituitary cyst removal and hydrocephalus with VP shunt placed age 21, chronic pain syndrome on narcotics, memory loss, IDA, CKD stage 3b, GERD and chronic constipation. S/P cholecystectomy 1981 (she reported having 44 gallstones and her VP shunt was dislodged during this surgery which required revision).   She presents today for follow up regarding anemia, esophageal ulcerations/cameron lesions and to schedule a follow up EGD. She is accompanied by her best friend who is a Equities trader.   She was admitted to the hospital 6/18/ - 05/03/2022 with altered mental status and dizziness which resulted in falling at home and hitting the back of her head without loss of consciousness. Reportedly had dark stools. Labs in the ED showed profound anemia with Hg 6.8 -> (down from 9.9 on 04/15/2022). FOBT +. Head CT was negative. She was transfused one unit of PRBCs. She underwent an EGD by Dr. Lorenso Courier which identified a benign esophageal stenosis, a hiatal hernia with multiple Cameron lesions and esophageal ulcers. Path report was benign, no evidence of H. Pylori or celiac disease. She was prescribed Pantoprazole 18m bid x 3 months, Carafate suspension qid x 1 month, avoid NSAIDs and repeat EGD in 2 to 3 months and possible colonoscopy. Hemoglobin level was 8.8 at time of discharged.   Repeat Hg 10.6 on 05/25/2022.  She remains on Pantoprazole 443mbid and Famotidine 4060mt bed time since she was discharged home from the hospital 04/2022. She reported her acid reflux symptoms have significantly improved since she was discharged home from the hospital but has severe reflux if she eats later in the evening. She has  a normal formed brown bowel movement daily as long as she takes Linzess 290m17mnce daily. She recently ran out of her supply of Linzess and is taking 2 stool softeners and if no BM in 2 days she takes 1 or 2 Dulcolax tablets which results in sitting on the commode for 2 hours with excessive bowel movements. No rectal bleeding or black stools. She is requesting samples of Linzess as she is in the donut hole period at this time and a month supply of Linzess costs $295.  She has a history of chronic IDA, previously evaluated by Dr. PyrtHilarie Fredrickson2015 - 2016. EGD 10/2014 showed a 4 cm hiatal hernia with CameLysbeth Galasions, a Schatzki's ring. A colonoscopy was done on the same date which was normal. A small bowel capsule endoscopy 04/2015 was unrevealing. The source of IDA was likely due to her hiatal hernia with CameLysbeth Galasions.   Maternal grandmother with history of esophagea cancer.   Mother with history of colon polyps.      Latest Ref Rng & Units 09/22/2022    3:26 PM 05/25/2022    1:38 PM 05/02/2022    4:51 PM  CBC  WBC 4.0 - 10.5 K/uL 8.5  5.2  9.3   Hemoglobin 12.0 - 15.0 g/dL 12.9  10.6  8.8   Hematocrit 36.0 - 46.0 % 38.0  33.1  26.4   Platelets 150.0 - 400.0 K/uL 550.0  421.0  399        Latest Ref Rng & Units 09/22/2022    3:26 PM 05/25/2022    1:38 PM 05/03/2022  2:52 AM  CMP  Glucose 70 - 99 mg/dL 97  99  83   BUN 6 - 23 mg/dL _0 Creatinine 0.40 - 1.20 mg/dL 1.03  1.16  0.83   Sodium 135 - 145 mEq/L 138  141  138   Potassium 3.5 - 5.1 mEq/L 4.2  4.1  4.2   Chloride 96 - 112 mEq/L 102  107  110   CO2 19 - 32 mEq/L _1 Calcium 8.4 - 10.5 mg/dL 9.8  9.8  8.4   Total Protein 6.0 - 8.3 g/dL 7.7  7.0    Total Bilirubin 0.2 - 1.2 mg/dL 0.4  0.3    Alkaline Phos 39 - 117 U/L 74  60    AST 0 - 37 U/L 19  22    ALT 0 - 35 U/L 16  14      GI PROCEDURES:  EGD 05/02/2022: Esophageal ulcers. - Benign-appearing esophageal stenosis. - Hiatal hernia. - Non-bleeding gastric  ulcers (Cameron lesions) with pigmented material. - Erythematous mucosa in the antrum. Biopsied. - Normal examined duodenum. Biopsied. - It is suspected that the patient's anemia and black stools is due to ulcers in the stomach and esophagus. - We will arrange for outpatient GI follow up in 2-3 weeks. Can consider colonoscopy as an outpatient at that time if patient's blood counts are not responding to the above therapy. A. DUODENUM, BIOPSY:  -  Duodenal mucosa with prominent Brunner's glands, otherwise no  significant pathology.  B. STOMACH, BIOPSY:  -  Antral mucosa with mild chemical/reactive change.  -  Oxyntic mucosa with no significant pathology.  -  No Helicobacter pylori organisms identified on HE stained slide.   Small bowel capsule endoscopy 05/14/2015: 1) Incomplete study, poor visualization beyond 2 hours 11 minutes 2) Negative study with limited visualization in distal small bowel. No evidence of Crohn's disease in visualized portion of small bowel  Summary: No small bowel source found to explain IDA. Previously seen Lysbeth Galas lesions related to hiatal hernia most likely explanation  EGD 10/29/2014: 1. Schatzki ring was found with mild esophagitis at the GE junction, 30 cm from the incisors 2. 4 cm hiatal hernia 3. Multiple cameron's erosions were found in the cardia 4. The stomach otherwise appeared normal 5. The duodenal mucosa showed no abnormalities in the bulb and 2nd part of the duodenum cold forcep biopsies were taken in the second portion  Colonoscopy 10/29/2014: Normal colonoscopy  10 year recall colonoscopy   Past Medical History:  Diagnosis Date   Anxiety    Lysbeth Galas lesion, acute    Cerebral ventricular shunt fitting or adjustment    CKD (chronic kidney disease), stage III ([place])    COMMON MIGRAINE 10/01/2007   Chronic     Depression    Esophageal ulceration    GERD (gastroesophageal reflux disease)    GI bleed    GLUCOSE INTOLERANCE 10/01/2007    Qualifier: Diagnosis of  By: Samantha Reichmann MD, Hunt Oris    Hiatal hernia    Hydrocephalus ([place])    Hyperlipidemia    HYPERLIPIDEMIA 10/01/2007   Chronic. Lovaza is too $$$ Declined statins due to worries    IDA (iron deficiency anemia)    Memory loss    Migraine    OBSTRUCTIVE SLEEP APNEA 10/01/2007   NPSG 2006:  AHI 9/hr Started cpap 2009 successfully.      Paresthesia    Prolonged QT interval    Schatzki's  ring    Sleep apnea    Stroke ([place]) 03/14/2013   Past Surgical History:  Procedure Laterality Date   [place],   cerebral vascular shunt/ with a revision 1981   BIOPSY  05/02/2022   Procedure: BIOPSY;  Surgeon: Sharyn Creamer, MD;  Location: [facility];  Service: Gastroenterology;;   CHOLECYSTECTOMY  1981   CYST REMOVAL NECK     ESOPHAGOGASTRODUODENOSCOPY (EGD) WITH PROPOFOL N/A 05/02/2022   Procedure: ESOPHAGOGASTRODUODENOSCOPY (EGD) WITH PROPOFOL;  Surgeon: Sharyn Creamer, MD;  Location: [facility];  Service: Gastroenterology;  Laterality: N/A;   Pituitary cyst w/subsequent shunt     Social History: She is married. Disabled after her stroke and due to severe arthritis. Nonsmoker. No alcohol use. Takes chronic narcotics as prescribed by MD.  Family History: Maternal grandmother died from esophageal cancer. Mother had colon polyps, she died from Alzheimer's disease. Maternal aunt had brain cancer. Maternal uncle had lung cancer.    Allergies  Allergen Reactions   Alprazolam Other (See Comments)    Crying all the time   Citalopram Hydrobromide Other (See Comments)    low libido      Outpatient Encounter Medications as of 09/22/2022  Medication Sig   atorvastatin (LIPITOR) 20 MG tablet TAKE 1 TABLET EVERY DAY AT 6 PM   busPIRone (BUSPAR) 10 MG tablet TAKE 1 TABLET THREE TIMES DAILY (Patient taking differently: Take 10 mg by mouth 3 (three) times daily.)   Calcium Carbonate-Vitamin D (CALCIUM-VITAMIN D) 500-200 MG-UNIT per tablet Take 1 tablet by mouth 2 (two) times  daily with a meal.   Docusate Calcium (STOOL SOFTENER PO) Take 1 capsule by mouth 2 (two) times daily.   donepezil (ARICEPT) 10 MG tablet TAKE 1 TABLET AT BEDTIME (Patient taking differently: Take 10 mg by mouth at bedtime.)   DULoxetine (CYMBALTA) 60 MG capsule TAKE 1 CAPSULE EVERY DAY (Patient taking differently: Take 60 mg by mouth daily.)   famotidine (PEPCID) 40 MG tablet Take 40 mg by mouth daily.   Ferric Maltol (ACCRUFER) 30 MG CAPS 1 po bid   gabapentin (NEURONTIN) 300 MG capsule Take 300 mg by mouth 3 (three) times daily.   lactulose (CHRONULAC) 10 GM/15ML solution Take 30-45 mLs (20-30 g total) by mouth 2 (two) times daily as needed for moderate constipation or severe constipation.   lamoTRIgine (LAMICTAL) 100 MG tablet TAKE 1 TABLET EVERY MORNING, AND 1 AND 1/2 TABLETS AT NIGHT.   lidocaine (LIDODERM) 5 % Place 1 patch onto the skin daily. Remove & Discard patch within 12 hours or as directed by MD   linaclotide (LINZESS) 290 MCG CAPS capsule TAKE 1 CAPSULE DAILY BEFORE BREAKFAST   memantine (NAMENDA) 5 MG tablet TAKE 1 TABLET TWICE DAILY   morphine (MS CONTIN) 30 MG 12 hr tablet Take 1 tablet (30 mg total) by mouth every 12 (twelve) hours.   [EXPIRED] Na Sulfate-K Sulfate-Mg Sulf 17.5-3.13-1.6 GM/177ML SOLN Take 1 kit by mouth once for 1 dose.   naloxone (NARCAN) nasal spray 4 mg/0.1 mL Place 1 spray into the nose once.   olmesartan (BENICAR) 20 MG tablet Take 1 tablet (20 mg total) by mouth daily.   Oxycodone HCl 10 MG TABS Take 1 tablet (10 mg total) by mouth 3 (three) times daily as needed.   Probiotic Product (ALIGN) 4 MG CAPS Take 1 capsule (4 mg total) by mouth daily. (Patient taking differently: Take 0.5 capsules by mouth daily.)   promethazine (PHENERGAN) 12.5 MG tablet Take 1 tablet (  12.5 mg total) by mouth every 6 (six) hours as needed for nausea or vomiting.   morphine (MS CONTIN) 30 MG 12 hr tablet Take 1 tablet (30 mg total) by mouth every 12 (twelve) hours. (Patient not  taking: Reported on 09/22/2022)   Oxycodone HCl 10 MG TABS Take 1 tablet (10 mg total) by mouth 3 (three) times daily as needed. (Patient not taking: Reported on 09/22/2022)   pantoprazole (PROTONIX) 40 MG tablet Take 1 tablet (40 mg total) by mouth 2 (two) times daily before a meal.   sucralfate (CARAFATE) 1 g tablet Take 1 tablet (1 g total) by mouth 4 (four) times daily for 28 days.   No facility-administered encounter medications on file as of 09/22/2022.    REVIEW OF SYSTEMS:  Gen: + Fatigue. Denies fever, sweats or chills. No weight loss.  CV: Denies chest pain, palpitations or edema. Resp: Denies cough, shortness of breath of hemoptysis.  GI:See HPI.   GU : Denies urinary burning, blood in urine, increased urinary frequency or incontinence. MS: + Chronic arthritis and back pain.  Derm: Denies rash, itchiness, skin lesions or unhealing ulcers. Psych: + Anxiety, depression and memory loss.  Heme: Denies bruising, easy bleeding. Neuro:  Denies headaches, dizziness or paresthesias. Endo:  Denies any problems with DM, thyroid or adrenal function.  PHYSICAL EXAM: BP (!) 130/98   Pulse 96   Ht _0  (1.473 m)   Wt 144 lb 5 oz (65.5 kg)   BMI 30.16 kg/m  Wt Readings from Last 3 Encounters:  09/22/22 144 lb 5 oz (65.5 kg)  07/28/22 146 lb 6.4 oz (66.4 kg)  05/26/22 151 lb (68.5 kg)    General: 56 year old female in NAD, pale appearing.  Head: Normocephalic and atraumatic. Eyes:  Sclerae non-icteric, conjunctive pink. Ears: Normal auditory acuity. Mouth: Upper dentures. No ulcers or lesions.  Neck: Supple, no lymphadenopathy or thyromegaly.  Lungs: Clear bilaterally to auscultation without wheezes, crackles or rhonchi. Heart: Regular rate and rhythm. No murmur, rub or gallop appreciated.  Abdomen: Soft, nontender, non distended. No masses. No hepatosplenomegaly. Normoactive bowel sounds x 4 quadrants. Vertical upper abdominal midline and LUQ horizontal scars intact.  Rectal:  Deferred.  Musculoskeletal: Symmetrical with no gross deformities. Skin: Warm and dry. No rash or lesions on visible extremities. Extremities: No edema. Neurological: Alert oriented x 4, no focal deficits.  Psychological:  Alert and cooperative. Normal mood and affect.  ASSESSMENT AND PLAN:  60) 56 year old female with chronic IDA who was admitted to the hospital with acute on chronic anemia with + FOBT. Admission Hg 6.8 -> 6.0 - transfused 1 unit of PRBCs. EGD 05/02/2022 identified a benign esophageal stenosis, a hiatal hernia with multiple Cameron lesions and esophageal ulcers. Path report was benign, no evidence of H. Pylori or celiac disease. Hg 8.8 at time of discharged. Hg 10.6 on 05/25/2022. She took Carafate qid x 1 months, remains on Pantoprazole 1m bid and Famotidine 420monce daily.  On oral iron. Acid reflux symptoms significantly improved but has night time reflux if she eats late in the evening. Normal colonoscopy 10/2014.  -CBC, IBC + ferritin  -EGD to assess for esophageal ulcer and CaLysbeth Galasesion healing benefits and risks discussed including risk with sedation, risk of bleeding, perforation and infection  -Colonoscopy at time of EGD, colonoscopy benefits and risks discussed including risk with sedation, risk of bleeding, perforation and infection  -Continue Pantoprazole  407mo bid and Famotidine 54m55m, further recommendations to  be determined after EGD completed  -Avoid eating within 3 to 4 hours of going to bed  -Patient to contact office if black stools or rectal bleeding occurs   2) Colon cancer screening. Normal colonoscopy 10/2014. Mother with reported history of colon polyps.  -Colonoscopy as ordered above. Dr. Hilarie Fredrickson to further verify if patient to proceed with a colonoscopy at the time of EGD  3) Chronic constipation secondary to chronic opioid use  -Linzess 272mg one tab to be taken 30 minutes before breakfast, 30 days supply of samples given to patient   4)  Memory loss on Aricept and Namenda. Patient was able to provide accurate details of her past medical history.          CC:  Plotnikov, AEvie Lacks MD

## 2022-09-22 NOTE — Patient Instructions (Addendum)
You have been scheduled for an endoscopy and colonoscopy. Please follow the written instructions given to you at your visit today. Please pick up your prep supplies at the pharmacy within the next 1-3 days. If you use inhalers (even only as needed), please bring them with you on the day of your procedure.   Your provider has requested that you go to the basement level for lab work before leaving today. Press "B" on the elevator. The lab is located at the first door on the left as you exit the elevator.   Continue Pantoprazole 40 mg- take 1 tablet twice daily  Continue Famotidine 40 mg- take 1 tablet daily  Linzess 290 mcg- Take 1 by mouth daily to be taken 30 minutes before breakfast  Due to recent changes in healthcare laws, you may see the results of your imaging and laboratory studies on MyChart before your provider has had a chance to review them.  We understand that in some cases there may be results that are confusing or concerning to you. Not all laboratory results come back in the same time frame and the provider may be waiting for multiple results in order to interpret others.  Please give [place] 48 hours in order for your provider to thoroughly review all the results before contacting the office for clarification of your results.    It was a pleasure to see you today!  Thank you for trusting me with your gastrointestinal care!

## 2022-09-26 ENCOUNTER — Ambulatory Visit: Payer: Medicare HMO

## 2022-09-26 NOTE — Progress Notes (Signed)
Samantha [name], please contact this very nice patient and let her know Dr. Rhea Belton agreed to that she should proceed with an upper endoscopy and colonoscopy as planned.  Thank you

## 2022-09-26 NOTE — Progress Notes (Signed)
Samantha [name] please see Dr. Lauro Franklin addendum as he recommended a 2-day bowel prep for her colonoscopy. Pls provide her with a 2 day bowel prep. Thanks.

## 2022-09-26 NOTE — Progress Notes (Signed)
Addendum: Reviewed and agree with assessment and management plan. Esophageal ulcers and Cameron's lesions likely explain IDA however given family history of colon cancer reasonable to repeat colonoscopy at the same time as upper endoscopy. Agree with 2-day prep. Appears to be appropriate for [facility] EGD and colonoscopy. Dannya Pitkin, Carie Caddy, MD

## 2022-09-27 ENCOUNTER — Encounter: Payer: Self-pay | Admitting: Internal Medicine

## 2022-09-27 ENCOUNTER — Telehealth: Payer: Self-pay

## 2022-09-27 ENCOUNTER — Ambulatory Visit (INDEPENDENT_AMBULATORY_CARE_PROVIDER_SITE_OTHER): Payer: Medicare HMO | Admitting: Internal Medicine

## 2022-09-27 VITALS — BP 142/84 | HR 95 | Temp 98.1°F | Ht <= 58 in | Wt 148.0 lb

## 2022-09-27 DIAGNOSIS — M545 Low back pain, unspecified: Secondary | ICD-10-CM | POA: Diagnosis not present

## 2022-09-27 DIAGNOSIS — M79605 Pain in left leg: Secondary | ICD-10-CM

## 2022-09-27 DIAGNOSIS — K2101 Gastro-esophageal reflux disease with esophagitis, with bleeding: Secondary | ICD-10-CM | POA: Diagnosis not present

## 2022-09-27 DIAGNOSIS — G8929 Other chronic pain: Secondary | ICD-10-CM

## 2022-09-27 DIAGNOSIS — K5901 Slow transit constipation: Secondary | ICD-10-CM

## 2022-09-27 DIAGNOSIS — D509 Iron deficiency anemia, unspecified: Secondary | ICD-10-CM | POA: Diagnosis not present

## 2022-09-27 MED ORDER — OXYCODONE HCL 10 MG PO TABS: 10.0000 mg | ORAL_TABLET | Freq: Three times a day (TID) | ORAL | 0 refills | Status: DC | PRN

## 2022-09-27 MED ORDER — MORPHINE SULFATE ER 30 MG PO TBCR: 30.0000 mg | EXTENDED_RELEASE_TABLET | Freq: Two times a day (BID) | ORAL | 0 refills | Status: DC

## 2022-09-27 MED ORDER — PANTOPRAZOLE SODIUM 40 MG PO TBEC: 40.0000 mg | DELAYED_RELEASE_TABLET | Freq: Two times a day (BID) | ORAL | 3 refills | Status: DC

## 2022-09-27 NOTE — Telephone Encounter (Signed)
Message Received: Percell Locus, Malachi Carl, NP  Emeline Darling, RN      Previous Messages  Routed Note  Author: Arnaldo Natal, NP Service: Gastroenterology Author Type: Nurse Practitioner  Filed: 09/26/2022  4:50 PM Encounter Date: 09/22/2022 Status: Signed  Editor: Arnaldo Natal, NP (Nurse Practitioner)   Viviann Spare please see Dr. Lauro Franklin addendum as he recommended a 2-day bowel prep for her colonoscopy. Pls provide her with a 2 day bowel prep. Thanks.

## 2022-09-27 NOTE — Assessment & Plan Note (Signed)
Colon and EGD is pending On Protonix bid 

## 2022-09-27 NOTE — Assessment & Plan Note (Signed)
Post-thalamic CVA pain syndrome Chronic pain in the back, hips - I took over Samantha [name]'s pain management (she was treated at [facility] in the past). On Oxycodone and MS Contin   Potential benefits of a long term opioids use as well as potential risks (i.e. addiction risk, apnea etc) and complications (i.e. Somnolence, constipation and others) were explained to the patient and were aknowledged. 

## 2022-09-27 NOTE — Assessment & Plan Note (Signed)
Chronic pain in the back, hips - I took over Samantha [name]'s pain management (she was treated at [facility] in the past). On Oxycodone and MS Contin   Potential benefits of a long term opioids use as well as potential risks (i.e. addiction risk, apnea etc) and complications (i.e. Somnolence, constipation and others) were explained to the patient and were aknowledged. 

## 2022-09-27 NOTE — Assessment & Plan Note (Signed)
Colon and EGD is pending On Protonix bid

## 2022-09-27 NOTE — Progress Notes (Signed)
Subjective:  Patient ID: Samantha [name], female    DOB: 1966/03/21  Age: 56 y.o. MRN: [id number]  CC: Follow-up   HPI Samantha [name] presents for chronic pain, constipation, GERD f/u   Outpatient Medications Prior to Visit  Medication Sig Dispense Refill   atorvastatin (LIPITOR) 20 MG tablet TAKE 1 TABLET EVERY DAY AT 6 PM 90 tablet 3   busPIRone (BUSPAR) 10 MG tablet TAKE 1 TABLET THREE TIMES DAILY (Patient taking differently: Take 10 mg by mouth 3 (three) times daily.) 270 tablet 1   Calcium Carbonate-Vitamin D (CALCIUM-VITAMIN D) 500-200 MG-UNIT per tablet Take 1 tablet by mouth 2 (two) times daily with a meal.     Docusate Calcium (STOOL SOFTENER PO) Take 1 capsule by mouth 2 (two) times daily.     donepezil (ARICEPT) 10 MG tablet TAKE 1 TABLET AT BEDTIME (Patient taking differently: Take 10 mg by mouth at bedtime.) 90 tablet 3   DULoxetine (CYMBALTA) 60 MG capsule TAKE 1 CAPSULE EVERY DAY (Patient taking differently: Take 60 mg by mouth daily.) 90 capsule 3   famotidine (PEPCID) 40 MG tablet Take 40 mg by mouth daily.     Ferric Maltol (ACCRUFER) 30 MG CAPS 1 po bid 60 capsule 5   gabapentin (NEURONTIN) 300 MG capsule Take 300 mg by mouth 3 (three) times daily.     lactulose (CHRONULAC) 10 GM/15ML solution Take 30-45 mLs (20-30 g total) by mouth 2 (two) times daily as needed for moderate constipation or severe constipation. 946 mL 5   lamoTRIgine (LAMICTAL) 100 MG tablet TAKE 1 TABLET EVERY MORNING, AND 1 AND 1/2 TABLETS AT NIGHT. 225 tablet 1   lidocaine (LIDODERM) 5 % Place 1 patch onto the skin daily. Remove & Discard patch within 12 hours or as directed by MD 180 patch 1   linaclotide (LINZESS) 290 MCG CAPS capsule TAKE 1 CAPSULE DAILY BEFORE BREAKFAST 90 capsule 2   memantine (NAMENDA) 5 MG tablet TAKE 1 TABLET TWICE DAILY 180 tablet 3   naloxone (NARCAN) nasal spray 4 mg/0.1 mL Place 1 spray into the nose once.     olmesartan (BENICAR) 20 MG tablet Take 1 tablet (20 mg  total) by mouth daily. 90 tablet 3   Probiotic Product (ALIGN) 4 MG CAPS Take 1 capsule (4 mg total) by mouth daily. (Patient taking differently: Take 0.5 capsules by mouth daily.) 90 capsule 1   promethazine (PHENERGAN) 12.5 MG tablet Take 1 tablet (12.5 mg total) by mouth every 6 (six) hours as needed for nausea or vomiting. 40 tablet 0   morphine (MS CONTIN) 30 MG 12 hr tablet Take 1 tablet (30 mg total) by mouth every 12 (twelve) hours. 60 tablet 0   morphine (MS CONTIN) 30 MG 12 hr tablet Take 1 tablet (30 mg total) by mouth every 12 (twelve) hours. 60 tablet 0   Oxycodone HCl 10 MG TABS Take 1 tablet (10 mg total) by mouth 3 (three) times daily as needed. 90 tablet 0   Oxycodone HCl 10 MG TABS Take 1 tablet (10 mg total) by mouth 3 (three) times daily as needed. 90 tablet 0   sucralfate (CARAFATE) 1 g tablet Take 1 tablet (1 g total) by mouth 4 (four) times daily for 28 days. 112 tablet 0   pantoprazole (PROTONIX) 40 MG tablet Take 1 tablet (40 mg total) by mouth 2 (two) times daily before a meal. 180 tablet 0   No facility-administered medications prior to visit.    ROS:  Review of Systems  Constitutional:  Positive for fatigue. Negative for activity change, appetite change, chills and unexpected weight change.  HENT:  Negative for congestion, mouth sores and sinus pressure.   Eyes:  Negative for visual disturbance.  Respiratory:  Negative for cough and chest tightness.   Gastrointestinal:  Positive for constipation. Negative for abdominal pain and nausea.  Genitourinary:  Negative for difficulty urinating, frequency and vaginal pain.  Musculoskeletal:  Positive for arthralgias and back pain. Negative for gait problem.  Skin:  Negative for pallor and rash.  Neurological:  Negative for dizziness, tremors, weakness, numbness and headaches.  Psychiatric/Behavioral:  Positive for suicidal ideas. Negative for confusion and sleep disturbance. The patient is nervous/anxious.     Objective:   BP (!) 142/84 (BP Location: Left Arm, Patient Position: Sitting, Cuff Size: Normal)   Pulse 95   Temp 98.1 F (36.7 C) (Oral)   Ht 4\' 8"  (1.422 m)   Wt 148 lb (67.1 kg)   SpO2 98%   BMI 33.18 kg/m   BP Readings from Last 3 Encounters:  09/27/22 (!) 142/84  09/22/22 (!) 130/98  07/28/22 (!) 148/92    Wt Readings from Last 3 Encounters:  09/27/22 148 lb (67.1 kg)  09/22/22 144 lb 5 oz (65.5 kg)  07/28/22 146 lb 6.4 oz (66.4 kg)    Physical Exam Constitutional:      General: She is not in acute distress.    Appearance: She is well-developed. She is obese.  HENT:     Head: Normocephalic.     Right Ear: External ear normal.     Left Ear: External ear normal.     Nose: Nose normal.  Eyes:     General:        Right eye: No discharge.        Left eye: No discharge.     Conjunctiva/sclera: Conjunctivae normal.     Pupils: Pupils are equal, round, and reactive to light.  Neck:     Thyroid: No thyromegaly.     Vascular: No JVD.     Trachea: No tracheal deviation.  Cardiovascular:     Rate and Rhythm: Normal rate and regular rhythm.     Heart sounds: Normal heart sounds.  Pulmonary:     Effort: No respiratory distress.     Breath sounds: No stridor. No wheezing.  Abdominal:     General: Bowel sounds are normal. There is no distension.     Palpations: Abdomen is soft. There is no mass.     Tenderness: There is no abdominal tenderness. There is no guarding or rebound.  Musculoskeletal:        General: No tenderness.     Cervical back: Normal range of motion and neck supple. No rigidity.  Lymphadenopathy:     Cervical: No cervical adenopathy.  Skin:    Findings: No erythema or rash.  Neurological:     Cranial Nerves: No cranial nerve deficit.     Motor: No abnormal muscle tone.     Coordination: Coordination normal.     Gait: Gait normal.     Deep Tendon Reflexes: Reflexes normal.  Psychiatric:        Behavior: Behavior normal.        Thought Content: Thought  content normal.        Judgment: Judgment normal.   LS w/pain  Lab Results  Component Value Date   WBC 8.5 09/22/2022   HGB 12.9 09/22/2022   HCT 38.0 09/22/2022  PLT 550.0 (H) 09/22/2022   GLUCOSE 97 09/22/2022   CHOL 217 (H) 09/02/2021   TRIG 204.0 (H) 09/02/2021   HDL 52.80 09/02/2021   LDLDIRECT 120.0 09/02/2021   LDLCALC 85 07/16/2020   ALT 16 09/22/2022   AST 19 09/22/2022   NA 138 09/22/2022   K 4.2 09/22/2022   CL 102 09/22/2022   CREATININE 1.03 09/22/2022   BUN 10 09/22/2022   CO2 30 09/22/2022   TSH 3.09 09/02/2021   INR 1.0 05/02/2022   HGBA1C 5.7 01/12/2022    CT Cervical Spine Wo Contrast  Result Date: 05/01/2022 CLINICAL DATA:  Neck trauma, midline tenderness (Age 67-64y) fall EXAM: CT CERVICAL SPINE WITHOUT CONTRAST TECHNIQUE: Multidetector CT imaging of the cervical spine was performed without intravenous contrast. Multiplanar CT image reconstructions were also generated. RADIATION DOSE REDUCTION: This exam was performed according to the departmental dose-optimization program which includes automated exposure control, adjustment of the mA and/or kV according to patient size and/or use of iterative reconstruction technique. COMPARISON:  MRI cervical spine 03/02/2013 FINDINGS: Alignment: Normal. Skull base and vertebrae: Prominent left C5 transverse foramina likely due to a tortuous vertebral artery. No acute fracture. No aggressive appearing focal osseous lesion or focal pathologic process. Soft tissues and spinal canal: No prevertebral fluid or swelling. No visible canal hematoma. Upper chest: Unremarkable. Other: None. IMPRESSION: No acute displaced fracture or traumatic listhesis of the cervical spine. Electronically Signed   By: Tish Frederickson M.D.   On: 05/01/2022 22:21   CT Head Wo Contrast  Result Date: 05/01/2022 CLINICAL DATA:  Mental status change, unknown cause EXAM: CT HEAD WITHOUT CONTRAST TECHNIQUE: Contiguous axial images were obtained from the  base of the skull through the vertex without intravenous contrast. RADIATION DOSE REDUCTION: This exam was performed according to the departmental dose-optimization program which includes automated exposure control, adjustment of the mA and/or kV according to patient size and/or use of iterative reconstruction technique. COMPARISON:  CT head 10/21/2015 BRAIN: BRAIN Similar-appearing right posterior porch ventriculoperitoneal shunt with tip again noted to terminate within the inferior horn of the right lateral ventricle. Right frontotemporal encephalomalacia. No evidence of large-territorial acute infarction. No parenchymal hemorrhage. No mass lesion. No extra-axial collection. No mass effect or midline shift. Similar appearance of the lateral ventricles and third ventricle. Basilar cisterns are patent. Vascular: No hyperdense vessel. Vascular clipping again noted at the central skull base. Skull: No acute fracture or focal lesion.  Right frontal craniotomy. Sinuses/Orbits: Paranasal sinuses and mastoid air cells are clear. The orbits are unremarkable. Other: None. IMPRESSION: No acute intracranial abnormality in a patient with known right posterior approach ventriculoperitoneal shunt. Stable size of lateral and third ventricles compared to 2016. Electronically Signed   By: Tish Frederickson M.D.   On: 05/01/2022 21:33   DG Chest Portable 1 View  Result Date: 05/01/2022 CLINICAL DATA:  Altered mental status EXAM: PORTABLE CHEST 1 VIEW COMPARISON:  08/29/2018 FINDINGS: Mild cardiomegaly. Small hiatal hernia. No focal airspace consolidation, pleural effusion, or pneumothorax. Right-sided VP shunt catheter with similar degree of irregularity of the catheter contours within the chest. Possible catheter discontinuity the level of the right sixth rib. IMPRESSION: 1. No acute cardiopulmonary disease. 2. Small hiatal hernia. 3. Possible VP shunt catheter discontinuity within the mid chest. Electronically Signed   By:  Duanne Guess D.O.   On: 05/01/2022 21:22    Assessment & Plan:   Problem List Items Addressed This Visit     Chronic pain - Primary  Post-thalamic CVA pain syndrome Chronic pain in the back, hips - I took over [name]'s pain management ([facility]). On Oxycodone and MS Contin   Potential benefits of a long term opioids use as well as potential risks (i.e. addiction risk, apnea etc) and complications (i.e. Somnolence, constipation and others) were explained to the patient and were aknowledged.      Relevant Medications   morphine (MS CONTIN) 30 MG 12 hr tablet   morphine (MS CONTIN) 30 MG 12 hr tablet   Oxycodone HCl 10 MG TABS   Oxycodone HCl 10 MG TABS   GERD (gastroesophageal reflux disease)    Colon and EGD is pending On Protonix bid      Relevant Medications   pantoprazole (PROTONIX) 40 MG tablet   Anemia    Colon and EGD is pending On Protonix bid      Low back pain radiating to left lower extremity    Chronic pain in the back, hips - I took over [name]'s pain management (she was [facility]). On Oxycodone and MS Contin   Potential benefits of a long term opioids use as well as potential risks (i.e. addiction risk, apnea etc) and complications (i.e. Somnolence, constipation and others) were explained to the patient and were aknowledged.      Relevant Medications   morphine (MS CONTIN) 30 MG 12 hr tablet   morphine (MS CONTIN) 30 MG 12 hr tablet   Oxycodone HCl 10 MG TABS   Oxycodone HCl 10 MG TABS   Constipation    Cont on Linzess daily         Meds ordered this encounter  Medications   DISCONTD: pantoprazole (PROTONIX) 40 MG tablet    Sig: Take 1 tablet (40 mg total) by mouth 2 (two) times daily before a meal.    Dispense:  180 tablet    Refill:  3   morphine (MS CONTIN) 30 MG 12 hr tablet    Sig: Take 1 tablet (30 mg total) by mouth every 12 (twelve) hours.    Dispense:  60  tablet    Refill:  0    Please fill on or after 09/27/22   morphine (MS CONTIN) 30 MG 12 hr tablet    Sig: Take 1 tablet (30 mg total) by mouth every 12 (twelve) hours.    Dispense:  60 tablet    Refill:  0    Please fill on or after 10/27/22   Oxycodone HCl 10 MG TABS    Sig: Take 1 tablet (10 mg total) by mouth 3 (three) times daily as needed.    Dispense:  90 tablet    Refill:  0    Please fill on or after 09/27/22   Oxycodone HCl 10 MG TABS    Sig: Take 1 tablet (10 mg total) by mouth 3 (three) times daily as needed.    Dispense:  90 tablet    Refill:  0    Please fill on or after 10/27/22   pantoprazole (PROTONIX) 40 MG tablet    Sig: Take 1 tablet (40 mg total) by mouth 2 (two) times daily before a meal.    Dispense:  180 tablet    Refill:  3      Follow-up: Return in about 2 months (around 11/27/2022) for a follow-up visit.  Sonda PrimesAlex Daylin Gruszka, MD

## 2022-09-27 NOTE — Telephone Encounter (Signed)
Message Received: Percell Locus, Malachi Carl, NP  Emeline Darling, RN      Previous Messages  Routed Note  Author: Arnaldo Natal, NP Service: Gastroenterology Author Type: Nurse Practitioner  Filed: 09/26/2022  4:49 PM Encounter Date: 09/22/2022 Status: Signed  Editor: Arnaldo Natal, NP (Nurse Practitioner)   Viviann Spare, please contact this very nice patient and let her know Dr. Rhea Belton agreed to that she should proceed with an upper endoscopy and colonoscopy as planned.  Thank you

## 2022-09-27 NOTE — Assessment & Plan Note (Signed)
Cont on Linzess daily

## 2022-09-27 NOTE — Telephone Encounter (Signed)
Left message for pt to call back  °

## 2022-10-03 NOTE — Telephone Encounter (Signed)
Left message for pt to call back  °

## 2022-10-04 NOTE — Telephone Encounter (Signed)
Left message for pt to call back  °

## 2022-10-05 NOTE — Telephone Encounter (Signed)
Left message for pt to call back  °

## 2022-10-10 NOTE — Telephone Encounter (Signed)
Called patient and left voicemail for patient to call back.

## 2022-10-10 NOTE — Telephone Encounter (Signed)
Left message for pt to call back: Unable to reach pt after multiple attempts:   Pt was created a letter and sent to pt via mail with New Prep Instructions

## 2022-10-11 ENCOUNTER — Ambulatory Visit: Payer: Medicare HMO | Admitting: Internal Medicine

## 2022-10-12 ENCOUNTER — Telehealth: Payer: Self-pay | Admitting: Internal Medicine

## 2022-10-12 ENCOUNTER — Telehealth: Payer: Self-pay

## 2022-10-12 NOTE — Telephone Encounter (Signed)
Patient called and said that she needs something called in for hemmorhoids - Dr. Macario Golds told patient at last visit that he would call in something.  Please send to [facility] on [address].

## 2022-10-12 NOTE — Telephone Encounter (Signed)
Contacted patient and patient was aware of her lab results.

## 2022-10-14 MED ORDER — TRIAMCINOLONE ACETONIDE 0.1 % EX OINT: 1.0000 | TOPICAL_OINTMENT | Freq: Two times a day (BID) | CUTANEOUS | 2 refills | Status: DC

## 2022-10-14 NOTE — Telephone Encounter (Signed)
Okay.  Will send triamcinolone ointment to [facility].  Thanks

## 2022-10-14 NOTE — Telephone Encounter (Signed)
Notified pt MD sent cream to [facility] 

## 2022-10-17 ENCOUNTER — Ambulatory Visit: Payer: Medicare HMO | Admitting: Internal Medicine

## 2022-10-20 ENCOUNTER — Telehealth: Payer: Self-pay | Admitting: *Deleted

## 2022-10-20 MED ORDER — OLMESARTAN MEDOXOMIL 20 MG PO TABS: 20.0000 mg | ORAL_TABLET | Freq: Every day | ORAL | 3 refills | Status: DC

## 2022-10-20 NOTE — Telephone Encounter (Signed)
Pt needed refill on Olmesartan..[facility]Marland KitchenRaechel Chute

## 2022-10-25 ENCOUNTER — Ambulatory Visit: Payer: Medicare HMO

## 2022-11-04 ENCOUNTER — Telehealth: Payer: Self-pay | Admitting: Nurse Practitioner

## 2022-11-04 NOTE — Telephone Encounter (Signed)
Patient is calling seeking cheaper options for prep. Please advise

## 2022-11-08 NOTE — Telephone Encounter (Unsigned)
Left message for pt to call back  °

## 2022-11-09 NOTE — Telephone Encounter (Signed)
Left message for pt to call back  °

## 2022-11-09 NOTE — Telephone Encounter (Signed)
Patient is returning your call.  

## 2022-11-09 NOTE — Telephone Encounter (Signed)
Patient called wanting to cancel her procedure for 11/18/22 due to her prep being so expensive, I advised her that we might be able to change the prep for her to something more affordable. She asked if she would be able to get the same one she had last time which looks like its Movi prep. Please call patient and advise further.

## 2022-11-10 NOTE — Telephone Encounter (Signed)
Left message for pt to call back  °

## 2022-11-15 NOTE — Telephone Encounter (Signed)
Patient returned call

## 2022-11-15 NOTE — Telephone Encounter (Unsigned)
Left message for pt to call back. My Chart message sent to pt.  

## 2022-11-16 NOTE — Telephone Encounter (Signed)
Noted Procedure remains recommended per recent visit with Carl Best, NP

## 2022-11-16 NOTE — Telephone Encounter (Signed)
Left message for pt to call back  °

## 2022-11-16 NOTE — Telephone Encounter (Signed)
Pt stated that she want to cancel her procedure: Pt stated that her prep was to expensive. Pt was notified that I could send her a different prep along with new prep instructions:  Pt stated that she has been taking her iron: Pt stated that she feels like she has a cold as well.  Pt was offered to reschedule but pt declined: Pt stated that she will call back when she wants to be scheduled:

## 2022-11-18 ENCOUNTER — Encounter: Payer: Medicare HMO | Admitting: Internal Medicine

## 2022-11-24 ENCOUNTER — Encounter: Payer: Self-pay | Admitting: Internal Medicine

## 2022-11-24 ENCOUNTER — Ambulatory Visit (INDEPENDENT_AMBULATORY_CARE_PROVIDER_SITE_OTHER): Payer: Medicare HMO | Admitting: Internal Medicine

## 2022-11-24 VITALS — BP 126/80 | HR 85 | Temp 98.4°F | Ht <= 58 in | Wt 146.0 lb

## 2022-11-24 DIAGNOSIS — I10 Essential (primary) hypertension: Secondary | ICD-10-CM | POA: Diagnosis not present

## 2022-11-24 DIAGNOSIS — R413 Other amnesia: Secondary | ICD-10-CM | POA: Diagnosis not present

## 2022-11-24 DIAGNOSIS — I639 Cerebral infarction, unspecified: Secondary | ICD-10-CM

## 2022-11-24 DIAGNOSIS — G8929 Other chronic pain: Secondary | ICD-10-CM | POA: Diagnosis not present

## 2022-11-24 DIAGNOSIS — F4323 Adjustment disorder with mixed anxiety and depressed mood: Secondary | ICD-10-CM | POA: Diagnosis not present

## 2022-11-24 DIAGNOSIS — M79605 Pain in left leg: Secondary | ICD-10-CM | POA: Diagnosis not present

## 2022-11-24 DIAGNOSIS — M545 Low back pain, unspecified: Secondary | ICD-10-CM | POA: Diagnosis not present

## 2022-11-24 MED ORDER — OXYCODONE HCL 10 MG PO TABS: 10.0000 mg | ORAL_TABLET | Freq: Three times a day (TID) | ORAL | 0 refills | Status: DC | PRN

## 2022-11-24 MED ORDER — MORPHINE SULFATE ER 30 MG PO TBCR: 30.0000 mg | EXTENDED_RELEASE_TABLET | Freq: Two times a day (BID) | ORAL | 0 refills | Status: DC

## 2022-11-24 NOTE — Assessment & Plan Note (Signed)
On Namenda, Aricept Walk more Valerian root for insomnia

## 2022-11-24 NOTE — Assessment & Plan Note (Signed)
On Cymbalta, BuSpar

## 2022-11-24 NOTE — Assessment & Plan Note (Signed)
Cont Benicar 20 mg/d NAS diet Walk more

## 2022-11-24 NOTE — Assessment & Plan Note (Signed)
Post-thalamic CVA pain syndrome Chronic pain in the back, hips - I took over [name]'s pain management (she was treated at [facility] in the past). On Oxycodone and MS Contin   Potential benefits of a long term opioids use as well as potential risks (i.e. addiction risk, apnea etc) and complications (i.e. Somnolence, constipation and others) were explained to the patient and were aknowledged. 

## 2022-11-24 NOTE — Assessment & Plan Note (Signed)
Off ASA due to GI bleed 2023 

## 2022-11-24 NOTE — Assessment & Plan Note (Signed)
Chronic pain in the back, hips - I took over Samantha [name]'s pain management (she was treated at [facility] in the past). On Oxycodone and MS Contin   Potential benefits of a long term opioids use as well as potential risks (i.e. addiction risk, apnea etc) and complications (i.e. Somnolence, constipation and others) were explained to the patient and were aknowledged.

## 2022-11-24 NOTE — Progress Notes (Signed)
Subjective:  Patient ID: Samantha [name], female    DOB: 12/28/1965  Age: 57 y.o. MRN: [id number]  CC: Follow-up (Having trouble sleeping would medication)   HPI Samantha [name] presents for chronic pain, dyslipidemia, memory loss  Outpatient Medications Prior to Visit  Medication Sig Dispense Refill   atorvastatin (LIPITOR) 20 MG tablet TAKE 1 TABLET EVERY DAY AT 6 PM 90 tablet 3   busPIRone (BUSPAR) 10 MG tablet TAKE 1 TABLET THREE TIMES DAILY (Patient taking differently: Take 10 mg by mouth 3 (three) times daily.) 270 tablet 1   Calcium Carbonate-Vitamin D (CALCIUM-VITAMIN D) 500-200 MG-UNIT per tablet Take 1 tablet by mouth 2 (two) times daily with a meal.     Docusate Calcium (STOOL SOFTENER PO) Take 1 capsule by mouth 2 (two) times daily.     donepezil (ARICEPT) 10 MG tablet TAKE 1 TABLET AT BEDTIME (Patient taking differently: Take 10 mg by mouth at bedtime.) 90 tablet 3   DULoxetine (CYMBALTA) 60 MG capsule TAKE 1 CAPSULE EVERY DAY (Patient taking differently: Take 60 mg by mouth daily.) 90 capsule 3   famotidine (PEPCID) 40 MG tablet Take 40 mg by mouth daily.     Ferric Maltol (ACCRUFER) 30 MG CAPS 1 po bid 60 capsule 5   gabapentin (NEURONTIN) 300 MG capsule Take 300 mg by mouth 3 (three) times daily.     lactulose (CHRONULAC) 10 GM/15ML solution Take 30-45 mLs (20-30 g total) by mouth 2 (two) times daily as needed for moderate constipation or severe constipation. 946 mL 5   lamoTRIgine (LAMICTAL) 100 MG tablet TAKE 1 TABLET EVERY MORNING, AND 1 AND 1/2 TABLETS AT NIGHT. 225 tablet 1   lidocaine (LIDODERM) 5 % Place 1 patch onto the skin daily. Remove & Discard patch within 12 hours or as directed by MD 180 patch 1   linaclotide (LINZESS) 290 MCG CAPS capsule TAKE 1 CAPSULE DAILY BEFORE BREAKFAST 90 capsule 2   memantine (NAMENDA) 5 MG tablet TAKE 1 TABLET TWICE DAILY 180 tablet 3   naloxone (NARCAN) nasal spray 4 mg/0.1 mL Place 1 spray into the nose once.     olmesartan  (BENICAR) 20 MG tablet Take 1 tablet (20 mg total) by mouth daily. 90 tablet 3   pantoprazole (PROTONIX) 40 MG tablet Take 1 tablet (40 mg total) by mouth 2 (two) times daily before a meal. 180 tablet 3   Probiotic Product (ALIGN) 4 MG CAPS Take 1 capsule (4 mg total) by mouth daily. (Patient taking differently: Take 0.5 capsules by mouth daily.) 90 capsule 1   promethazine (PHENERGAN) 12.5 MG tablet Take 1 tablet (12.5 mg total) by mouth every 6 (six) hours as needed for nausea or vomiting. 40 tablet 0   triamcinolone ointment (KENALOG) 0.1 % Apply 1 Application topically 2 (two) times daily. 80 g 2   morphine (MS CONTIN) 30 MG 12 hr tablet Take 1 tablet (30 mg total) by mouth every 12 (twelve) hours. 60 tablet 0   Oxycodone HCl 10 MG TABS Take 1 tablet (10 mg total) by mouth 3 (three) times daily as needed. 90 tablet 0   sucralfate (CARAFATE) 1 g tablet Take 1 tablet (1 g total) by mouth 4 (four) times daily for 28 days. 112 tablet 0   morphine (MS CONTIN) 30 MG 12 hr tablet Take 1 tablet (30 mg total) by mouth every 12 (twelve) hours. 60 tablet 0   Oxycodone HCl 10 MG TABS Take 1 tablet (10 mg total) by  mouth 3 (three) times daily as needed. 90 tablet 0   No facility-administered medications prior to visit.    ROS: Review of Systems  Constitutional:  Positive for fatigue. Negative for activity change, appetite change, chills and unexpected weight change.  HENT:  Negative for congestion, mouth sores and sinus pressure.   Eyes:  Negative for visual disturbance.  Respiratory:  Negative for cough and chest tightness.   Gastrointestinal:  Negative for abdominal pain and nausea.  Genitourinary:  Negative for difficulty urinating, frequency and vaginal pain.  Musculoskeletal:  Positive for back pain. Negative for gait problem.  Skin:  Negative for pallor and rash.  Neurological:  Negative for dizziness, tremors, weakness, numbness and headaches.  Psychiatric/Behavioral:  Positive for confusion  and decreased concentration. Negative for sleep disturbance and suicidal ideas. The patient is nervous/anxious.     Objective:  BP 126/80 (BP Location: Right Arm, Patient Position: Sitting, Cuff Size: Normal)   Pulse 85   Temp 98.4 F (36.9 C) (Oral)   Ht 4\' 8"  (1.422 m)   Wt 146 lb (66.2 kg)   SpO2 97%   BMI 32.73 kg/m   BP Readings from Last 3 Encounters:  11/24/22 126/80  09/27/22 (!) 142/84  09/22/22 (!) 130/98    Wt Readings from Last 3 Encounters:  11/24/22 146 lb (66.2 kg)  09/27/22 148 lb (67.1 kg)  09/22/22 144 lb 5 oz (65.5 kg)    Physical Exam Constitutional:      General: She is not in acute distress.    Appearance: She is well-developed. She is obese.  HENT:     Head: Normocephalic.     Right Ear: External ear normal.     Left Ear: External ear normal.     Nose: Nose normal.  Eyes:     General:        Right eye: No discharge.        Left eye: No discharge.     Conjunctiva/sclera: Conjunctivae normal.     Pupils: Pupils are equal, round, and reactive to light.  Neck:     Thyroid: No thyromegaly.     Vascular: No JVD.     Trachea: No tracheal deviation.  Cardiovascular:     Rate and Rhythm: Normal rate and regular rhythm.     Heart sounds: Normal heart sounds.  Pulmonary:     Effort: No respiratory distress.     Breath sounds: No stridor. No wheezing.  Abdominal:     General: Bowel sounds are normal. There is no distension.     Palpations: Abdomen is soft. There is no mass.     Tenderness: There is no abdominal tenderness. There is no guarding or rebound.  Musculoskeletal:        General: No tenderness.     Cervical back: Normal range of motion and neck supple. No rigidity.  Lymphadenopathy:     Cervical: No cervical adenopathy.  Skin:    Findings: No erythema or rash.  Neurological:     Cranial Nerves: No cranial nerve deficit.     Motor: No abnormal muscle tone.     Coordination: Coordination normal.     Deep Tendon Reflexes: Reflexes  normal.  Psychiatric:        Behavior: Behavior normal.        Thought Content: Thought content normal.        Judgment: Judgment normal.     Lab Results  Component Value Date   WBC 8.5 09/22/2022   HGB  12.9 09/22/2022   HCT 38.0 09/22/2022   PLT 550.0 (H) 09/22/2022   GLUCOSE 97 09/22/2022   CHOL 217 (H) 09/02/2021   TRIG 204.0 (H) 09/02/2021   HDL 52.80 09/02/2021   LDLDIRECT 120.0 09/02/2021   LDLCALC 85 07/16/2020   ALT 16 09/22/2022   AST 19 09/22/2022   NA 138 09/22/2022   K 4.2 09/22/2022   CL 102 09/22/2022   CREATININE 1.03 09/22/2022   BUN 10 09/22/2022   CO2 30 09/22/2022   TSH 3.09 09/02/2021   INR 1.0 05/02/2022   HGBA1C 5.7 01/12/2022    CT Cervical Spine Wo Contrast  Result Date: 05/01/2022 CLINICAL DATA:  Neck trauma, midline tenderness (Age 58-64y) fall EXAM: CT CERVICAL SPINE WITHOUT CONTRAST TECHNIQUE: Multidetector CT imaging of the cervical spine was performed without intravenous contrast. Multiplanar CT image reconstructions were also generated. RADIATION DOSE REDUCTION: This exam was performed according to the departmental dose-optimization program which includes automated exposure control, adjustment of the mA and/or kV according to patient size and/or use of iterative reconstruction technique. COMPARISON:  MRI cervical spine 03/02/2013 FINDINGS: Alignment: Normal. Skull base and vertebrae: Prominent left C5 transverse foramina likely due to a tortuous vertebral artery. No acute fracture. No aggressive appearing focal osseous lesion or focal pathologic process. Soft tissues and spinal canal: No prevertebral fluid or swelling. No visible canal hematoma. Upper chest: Unremarkable. Other: None. IMPRESSION: No acute displaced fracture or traumatic listhesis of the cervical spine. Electronically Signed   By: Iven Finn M.D.   On: 05/01/2022 22:21   CT Head Wo Contrast  Result Date: 05/01/2022 CLINICAL DATA:  Mental status change, unknown cause EXAM: CT  HEAD WITHOUT CONTRAST TECHNIQUE: Contiguous axial images were obtained from the base of the skull through the vertex without intravenous contrast. RADIATION DOSE REDUCTION: This exam was performed according to the departmental dose-optimization program which includes automated exposure control, adjustment of the mA and/or kV according to patient size and/or use of iterative reconstruction technique. COMPARISON:  CT head 10/21/2015 BRAIN: BRAIN Similar-appearing right posterior porch ventriculoperitoneal shunt with tip again noted to terminate within the inferior horn of the right lateral ventricle. Right frontotemporal encephalomalacia. No evidence of large-territorial acute infarction. No parenchymal hemorrhage. No mass lesion. No extra-axial collection. No mass effect or midline shift. Similar appearance of the lateral ventricles and third ventricle. Basilar cisterns are patent. Vascular: No hyperdense vessel. Vascular clipping again noted at the central skull base. Skull: No acute fracture or focal lesion.  Right frontal craniotomy. Sinuses/Orbits: Paranasal sinuses and mastoid air cells are clear. The orbits are unremarkable. Other: None. IMPRESSION: No acute intracranial abnormality in a patient with known right posterior approach ventriculoperitoneal shunt. Stable size of lateral and third ventricles compared to 2016. Electronically Signed   By: Iven Finn M.D.   On: 05/01/2022 21:33   DG Chest Portable 1 View  Result Date: 05/01/2022 CLINICAL DATA:  Altered mental status EXAM: PORTABLE CHEST 1 VIEW COMPARISON:  08/29/2018 FINDINGS: Mild cardiomegaly. Small hiatal hernia. No focal airspace consolidation, pleural effusion, or pneumothorax. Right-sided VP shunt catheter with similar degree of irregularity of the catheter contours within the chest. Possible catheter discontinuity the level of the right sixth rib. IMPRESSION: 1. No acute cardiopulmonary disease. 2. Small hiatal hernia. 3. Possible VP  shunt catheter discontinuity within the mid chest. Electronically Signed   By: Davina Poke D.O.   On: 05/01/2022 21:22    Assessment & Plan:   Problem List Items Addressed This Visit  Cardiovascular and Mediastinum   HTN (hypertension) - Primary    Cont Benicar 20 mg/d NAS diet Walk more        Other   Memory loss    On Namenda, Aricept Walk more Valerian root for insomnia      Low back pain radiating to left lower extremity    Chronic pain in the back, hips - I took over [name]'s pain management (she was treated at [facility] in the past). On Oxycodone and MS Contin   Potential benefits of a long term opioids use as well as potential risks (i.e. addiction risk, apnea etc) and complications (i.e. Somnolence, constipation and others) were explained to the patient and were aknowledged.      Relevant Medications   morphine (MS CONTIN) 30 MG 12 hr tablet   morphine (MS CONTIN) 30 MG 12 hr tablet   Oxycodone HCl 10 MG TABS   Oxycodone HCl 10 MG TABS   Chronic pain     Post-thalamic CVA pain syndrome Chronic pain in the back, hips - I took over [name]'s pain management (she was treated at [facility] in the past). On Oxycodone and MS Contin   Potential benefits of a long term opioids use as well as potential risks (i.e. addiction risk, apnea etc) and complications (i.e. Somnolence, constipation and others) were explained to the patient and were aknowledged.      Relevant Medications   morphine (MS CONTIN) 30 MG 12 hr tablet   morphine (MS CONTIN) 30 MG 12 hr tablet   Oxycodone HCl 10 MG TABS   Oxycodone HCl 10 MG TABS   Adjustment disorder with mixed anxiety and depressed mood    On Cymbalta, BuSpar          Meds ordered this encounter  Medications   morphine (MS CONTIN) 30 MG 12 hr tablet    Sig: Take 1 tablet (30 mg total) by mouth every 12 (twelve) hours.    Dispense:  60 tablet    Refill:  0    Please fill on or after 11/26/22    morphine (MS CONTIN) 30 MG 12 hr tablet    Sig: Take 1 tablet (30 mg total) by mouth every 12 (twelve) hours.    Dispense:  60 tablet    Refill:  0    Please fill on or after 12/26/22   Oxycodone HCl 10 MG TABS    Sig: Take 1 tablet (10 mg total) by mouth 3 (three) times daily as needed.    Dispense:  90 tablet    Refill:  0    Please fill on or after 11/26/22   Oxycodone HCl 10 MG TABS    Sig: Take 1 tablet (10 mg total) by mouth 3 (three) times daily as needed.    Dispense:  90 tablet    Refill:  0    Please fill on or after 12/26/22      Follow-up: Return in about 2 months (around 01/23/2023) for a follow-up visit.  Walker Kehr, MD

## 2022-11-25 ENCOUNTER — Telehealth: Payer: Self-pay | Admitting: Internal Medicine

## 2022-11-25 NOTE — Telephone Encounter (Signed)
Patient was seen yesterday by Dr. Alain Marion - he was supposed to prescribe something to help her sleep - she has not received any information on this from her Pharmacy - Can you please send int today.  [facility] - [facility] [place] ] Patient:  [phone]

## 2022-11-28 ENCOUNTER — Ambulatory Visit: Payer: Medicare HMO | Admitting: Internal Medicine

## 2022-11-30 ENCOUNTER — Telehealth: Payer: Self-pay | Admitting: *Deleted

## 2022-11-30 ENCOUNTER — Ambulatory Visit (AMBULATORY_SURGERY_CENTER): Payer: Medicare HMO | Admitting: *Deleted

## 2022-11-30 VITALS — Ht <= 58 in | Wt 130.0 lb

## 2022-11-30 DIAGNOSIS — D508 Other iron deficiency anemias: Secondary | ICD-10-CM

## 2022-11-30 DIAGNOSIS — K253 Acute gastric ulcer without hemorrhage or perforation: Secondary | ICD-10-CM

## 2022-11-30 MED ORDER — NA SULFATE-K SULFATE-MG SULF 17.5-3.13-1.6 GM/177ML PO SOLN: 1.0000 | Freq: Once | ORAL | 0 refills | Status: AC

## 2022-11-30 NOTE — Telephone Encounter (Signed)
Unable to reach at first # listed. LM with call back #. Will attempt second # listed

## 2022-11-30 NOTE — Progress Notes (Signed)
No egg or soy allergy known to patient  No issues known to pt with past sedation with any surgeries or procedures Patient denies ever being told they had issues or difficulty with intubation  No FH of Malignant Hyperthermia Pt is not on diet pills Pt is not on  home 02  Pt is not on blood thinners  Pt has issues with constipation Takes meds for issue Pt is not on dialysis Pt denies any upcoming cardiac testing Pt encouraged to use to use Singlecare or Goodrx to reduce cost  Patient's chart reviewed by Osvaldo Angst CNRA prior to previsit and patient appropriate for the [facility].  Previsit completed and red dot placed by patient's name on their procedure day (on provider's schedule).  .  Visit by phone Instructions sent by mail Pt can not afford prep ordered  will change to [facility] sent with instructions

## 2022-12-12 DIAGNOSIS — M1711 Unilateral primary osteoarthritis, right knee: Secondary | ICD-10-CM | POA: Diagnosis not present

## 2022-12-12 DIAGNOSIS — M25561 Pain in right knee: Secondary | ICD-10-CM | POA: Diagnosis not present

## 2022-12-19 ENCOUNTER — Other Ambulatory Visit: Payer: Self-pay | Admitting: Internal Medicine

## 2022-12-20 ENCOUNTER — Telehealth: Payer: Self-pay | Admitting: Internal Medicine

## 2022-12-20 ENCOUNTER — Encounter: Payer: Self-pay | Admitting: Internal Medicine

## 2022-12-20 NOTE — Telephone Encounter (Signed)
Patient called and said that on her last visit Dr. Alain Marion had said that he would call in something to help her sleep - She never received anything.  Can something be sent in to [facility] [place] please.  Please call patient and let her know.    #  [phone]

## 2022-12-21 NOTE — Telephone Encounter (Signed)
It is very scant with her other meds.  She can try melatonin or/and valerian root at bedtime.

## 2022-12-21 NOTE — Telephone Encounter (Signed)
Called pt no answer LMOM w/MD response../lmb 

## 2022-12-27 ENCOUNTER — Telehealth: Payer: Self-pay | Admitting: Internal Medicine

## 2022-12-27 MED ORDER — ONDANSETRON HCL 4 MG PO TABS: 4.0000 mg | ORAL_TABLET | ORAL | 0 refills | Status: AC

## 2022-12-27 NOTE — Telephone Encounter (Signed)
Inbound call from pt want to speak with a nurse regarding her up coming procedure 2/19 at 2:00.. Please advise

## 2022-12-27 NOTE — Telephone Encounter (Signed)
Call to pt to check on her questions, pt has questions re: clear liquids and what she can and cannot drink, pt states she is writing things down as she had a stroke in the past and is forgetful, pt had taken an NSAID this week, let her know that is fine, just do not take any after today.  Pt also fearful of nausea, states she has taken zofran in the past, 2 doses of zofran sent to her pharmacy, pt verb understanding.

## 2023-01-02 ENCOUNTER — Encounter: Payer: Self-pay | Admitting: Internal Medicine

## 2023-01-02 ENCOUNTER — Ambulatory Visit (AMBULATORY_SURGERY_CENTER): Payer: Medicare HMO | Admitting: Internal Medicine

## 2023-01-02 VITALS — BP 114/62 | HR 68 | Temp 97.5°F | Resp 15 | Ht 59.0 in | Wt 130.0 lb

## 2023-01-02 DIAGNOSIS — K2210 Ulcer of esophagus without bleeding: Secondary | ICD-10-CM | POA: Diagnosis not present

## 2023-01-02 DIAGNOSIS — K449 Diaphragmatic hernia without obstruction or gangrene: Secondary | ICD-10-CM

## 2023-01-02 DIAGNOSIS — D509 Iron deficiency anemia, unspecified: Secondary | ICD-10-CM

## 2023-01-02 DIAGNOSIS — F419 Anxiety disorder, unspecified: Secondary | ICD-10-CM | POA: Diagnosis not present

## 2023-01-02 DIAGNOSIS — K222 Esophageal obstruction: Secondary | ICD-10-CM | POA: Diagnosis not present

## 2023-01-02 DIAGNOSIS — G4733 Obstructive sleep apnea (adult) (pediatric): Secondary | ICD-10-CM | POA: Diagnosis not present

## 2023-01-02 DIAGNOSIS — N183 Chronic kidney disease, stage 3 unspecified: Secondary | ICD-10-CM | POA: Diagnosis not present

## 2023-01-02 DIAGNOSIS — F32A Depression, unspecified: Secondary | ICD-10-CM | POA: Diagnosis not present

## 2023-01-02 MED ORDER — SODIUM CHLORIDE 0.9 % IV SOLN: 500.0000 mL | Freq: Once | INTRAVENOUS | Status: DC

## 2023-01-02 NOTE — Progress Notes (Signed)
VS by DT  Pt's states no medical or surgical changes since previsit or office visit.  

## 2023-01-02 NOTE — Progress Notes (Signed)
GASTROENTEROLOGY PROCEDURE H&P NOTE   Primary Care Physician: Cassandria Anger, MD    Reason for Procedure:  Iron deficiency anemia patient with history of GERD with esophagitis, hiatal hernia with Cameron's erosions   Plan:    EGD and colonoscopy  Patient is appropriate for endoscopic procedure(s) in the ambulatory ([facility]) setting.  The nature of the procedure, as well as the risks, benefits, and alternatives were carefully and thoroughly reviewed with the patient. Ample time for discussion and questions allowed. The patient understood, was satisfied, and agreed to proceed.     HPI: Samantha [name] is a 57 y.o. female who presents for EGD and colonoscopy.  Medical history as below.  Tolerated the prep.  No recent chest pain or shortness of breath.  No abdominal pain today.  Past Medical History:  Diagnosis Date   Anxiety    Lysbeth Galas lesion, acute    Cerebral ventricular shunt fitting or adjustment    CKD (chronic kidney disease), stage III ([place])    COMMON MIGRAINE 10/01/2007   Chronic     Depression    Esophageal ulceration    GERD (gastroesophageal reflux disease)    GI bleed    GLUCOSE INTOLERANCE 10/01/2007   Qualifier: Diagnosis of  By: Jenny Reichmann MD, Hunt Oris    Hiatal hernia    Hydrocephalus ([place])    Hyperlipidemia    HYPERLIPIDEMIA 10/01/2007   Chronic. Lovaza is too $$$ Declined statins due to worries    IDA (iron deficiency anemia)    Memory loss    Migraine    OBSTRUCTIVE SLEEP APNEA 10/01/2007   NPSG 2006:  AHI 9/hr Started cpap 2009 successfully.      Paresthesia    Prolonged QT interval    Schatzki's ring    Sleep apnea    Stroke ([place]) 03/14/2013    Past Surgical History:  Procedure Laterality Date   [place],   cerebral vascular shunt/ with a revision 1981   BIOPSY  05/02/2022   Procedure: BIOPSY;  Surgeon: Sharyn Creamer, MD;  Location: [facility];  Service: Gastroenterology;;   CHOLECYSTECTOMY  1981   CYST REMOVAL NECK      ESOPHAGOGASTRODUODENOSCOPY (EGD) WITH PROPOFOL N/A 05/02/2022   Procedure: ESOPHAGOGASTRODUODENOSCOPY (EGD) WITH PROPOFOL;  Surgeon: Sharyn Creamer, MD;  Location: [facility];  Service: Gastroenterology;  Laterality: N/A;   Pituitary cyst w/subsequent shunt      Prior to Admission medications   Medication Sig Start Date End Date Taking? Authorizing Provider  atorvastatin (LIPITOR) 20 MG tablet TAKE 1 TABLET EVERY DAY AT 6 PM 06/23/22  Yes Plotnikov, Evie Lacks, MD  busPIRone (BUSPAR) 10 MG tablet TAKE 1 TABLET THREE TIMES DAILY 12/19/22  Yes Plotnikov, Evie Lacks, MD  Calcium Carbonate-Vitamin D (CALCIUM-VITAMIN D) 500-200 MG-UNIT per tablet Take 1 tablet by mouth 2 (two) times daily with a meal.   Yes [provider]  donepezil (ARICEPT) 10 MG tablet TAKE 1 TABLET AT BEDTIME Patient taking differently: Take 10 mg by mouth at bedtime. 03/31/22  Yes Plotnikov, Evie Lacks, MD  DULoxetine (CYMBALTA) 60 MG capsule TAKE 1 CAPSULE EVERY DAY 12/19/22  Yes Plotnikov, Evie Lacks, MD  famotidine (PEPCID) 40 MG tablet TAKE 1 TABLET EVERY DAY 12/19/22  Yes Plotnikov, Evie Lacks, MD  Ferric Maltol (ACCRUFER) 30 MG CAPS 1 po bid 05/12/22  Yes Plotnikov, Evie Lacks, MD  gabapentin (NEURONTIN) 600 MG tablet Take 600 mg by mouth 3 (three) times daily. 11/14/22  Yes [provider]  lamoTRIgine (LAMICTAL) 100 MG tablet TAKE 1 TABLET EVERY MORNING, AND 1 AND 1/2 TABLETS AT NIGHT. 06/15/22  Yes Plotnikov, Evie Lacks, MD  linaclotide (LINZESS) 290 MCG CAPS capsule TAKE 1 CAPSULE DAILY BEFORE BREAKFAST 07/20/22  Yes Plotnikov, Evie Lacks, MD  memantine (NAMENDA) 5 MG tablet TAKE 1 TABLET TWICE DAILY 06/23/22  Yes Plotnikov, Evie Lacks, MD  morphine (MS CONTIN) 30 MG 12 hr tablet Take 1 tablet (30 mg total) by mouth every 12 (twelve) hours. 11/24/22  Yes Plotnikov, Evie Lacks, MD  olmesartan (BENICAR) 20 MG tablet Take 1 tablet (20 mg total) by mouth daily. 10/20/22 10/20/23 Yes Plotnikov, Evie Lacks, MD  Oxycodone HCl 10 MG  TABS Take 1 tablet (10 mg total) by mouth 3 (three) times daily as needed. 11/24/22  Yes Plotnikov, Evie Lacks, MD  Docusate Calcium (STOOL SOFTENER PO) Take 1 capsule by mouth 2 (two) times daily.    [provider]  gabapentin (NEURONTIN) 300 MG capsule Take 300 mg by mouth 3 (three) times daily. Patient not taking: Reported on 01/02/2023    [provider]  lactulose (CHRONULAC) 10 GM/15ML solution Take 30-45 mLs (20-30 g total) by mouth 2 (two) times daily as needed for moderate constipation or severe constipation. 07/18/18   Plotnikov, Evie Lacks, MD  lidocaine (LIDODERM) 5 % Place 1 patch onto the skin daily. Remove & Discard patch within 12 hours or as directed by MD 01/14/19   Plotnikov, Evie Lacks, MD  pantoprazole (PROTONIX) 40 MG tablet Take 1 tablet (40 mg total) by mouth 2 (two) times daily before a meal. 09/27/22   Plotnikov, Evie Lacks, MD  Probiotic Product (ALIGN) 4 MG CAPS Take 1 capsule (4 mg total) by mouth daily. Patient taking differently: Take 0.5 capsules by mouth daily. 01/23/20   Plotnikov, Evie Lacks, MD  promethazine (PHENERGAN) 12.5 MG tablet Take 1 tablet (12.5 mg total) by mouth every 6 (six) hours as needed for nausea or vomiting. 04/28/22   Plotnikov, Evie Lacks, MD  sucralfate (CARAFATE) 1 g tablet Take 1 tablet (1 g total) by mouth 4 (four) times daily for 28 days. 06/06/22 07/04/22  Hoyt Koch, MD    Current Outpatient Medications  Medication Sig Dispense Refill   atorvastatin (LIPITOR) 20 MG tablet TAKE 1 TABLET EVERY DAY AT 6 PM 90 tablet 3   busPIRone (BUSPAR) 10 MG tablet TAKE 1 TABLET THREE TIMES DAILY 270 tablet 3   Calcium Carbonate-Vitamin D (CALCIUM-VITAMIN D) 500-200 MG-UNIT per tablet Take 1 tablet by mouth 2 (two) times daily with a meal.     donepezil (ARICEPT) 10 MG tablet TAKE 1 TABLET AT BEDTIME (Patient taking differently: Take 10 mg by mouth at bedtime.) 90 tablet 3   DULoxetine (CYMBALTA) 60 MG capsule TAKE 1 CAPSULE EVERY DAY 90  capsule 3   famotidine (PEPCID) 40 MG tablet TAKE 1 TABLET EVERY DAY 90 tablet 3   Ferric Maltol (ACCRUFER) 30 MG CAPS 1 po bid 60 capsule 5   gabapentin (NEURONTIN) 600 MG tablet Take 600 mg by mouth 3 (three) times daily.     lamoTRIgine (LAMICTAL) 100 MG tablet TAKE 1 TABLET EVERY MORNING, AND 1 AND 1/2 TABLETS AT NIGHT. 225 tablet 1   linaclotide (LINZESS) 290 MCG CAPS capsule TAKE 1 CAPSULE DAILY BEFORE BREAKFAST 90 capsule 2   memantine (NAMENDA) 5 MG tablet TAKE 1 TABLET TWICE DAILY 180 tablet 3   morphine (MS CONTIN) 30 MG 12 hr tablet Take 1 tablet (30 mg total) by  mouth every 12 (twelve) hours. 60 tablet 0   olmesartan (BENICAR) 20 MG tablet Take 1 tablet (20 mg total) by mouth daily. 90 tablet 3   Oxycodone HCl 10 MG TABS Take 1 tablet (10 mg total) by mouth 3 (three) times daily as needed. 90 tablet 0   Docusate Calcium (STOOL SOFTENER PO) Take 1 capsule by mouth 2 (two) times daily.     gabapentin (NEURONTIN) 300 MG capsule Take 300 mg by mouth 3 (three) times daily. (Patient not taking: Reported on 01/02/2023)     lactulose (CHRONULAC) 10 GM/15ML solution Take 30-45 mLs (20-30 g total) by mouth 2 (two) times daily as needed for moderate constipation or severe constipation. 946 mL 5   lidocaine (LIDODERM) 5 % Place 1 patch onto the skin daily. Remove & Discard patch within 12 hours or as directed by MD 180 patch 1   pantoprazole (PROTONIX) 40 MG tablet Take 1 tablet (40 mg total) by mouth 2 (two) times daily before a meal. 180 tablet 3   Probiotic Product (ALIGN) 4 MG CAPS Take 1 capsule (4 mg total) by mouth daily. (Patient taking differently: Take 0.5 capsules by mouth daily.) 90 capsule 1   promethazine (PHENERGAN) 12.5 MG tablet Take 1 tablet (12.5 mg total) by mouth every 6 (six) hours as needed for nausea or vomiting. 40 tablet 0   sucralfate (CARAFATE) 1 g tablet Take 1 tablet (1 g total) by mouth 4 (four) times daily for 28 days. 112 tablet 0   Current Facility-Administered  Medications  Medication Dose Route Frequency Provider Last Rate Last Admin   0.9 %  sodium chloride infusion  500 mL Intravenous Once Rasha Ibe, Lajuan Lines, MD        Allergies as of 01/02/2023 - Review Complete 01/02/2023  Allergen Reaction Noted   Alprazolam Other (See Comments) 09/20/2019   Citalopram hydrobromide Other (See Comments) 06/29/2010    Family History  Problem Relation Age of Onset   Dementia Mother    Kidney cancer Mother    Alzheimer's disease Mother    Migraines Mother    Colon polyps Mother    Heart disease Father    Diabetes Father    Brain cancer Maternal Aunt    Lung cancer Maternal Uncle    Esophageal cancer Maternal Grandmother    Alzheimer's disease Paternal Grandmother    Melanoma Other    Lung cancer Other    Lung cancer Other    Colon cancer Neg Hx    Rectal cancer Neg Hx    Stomach cancer Neg Hx     Social History   Socioeconomic History   Marital status: Married    Spouse name: Doctor, general practice   Number of children: 0   Years of education: 12   Highest education level: Not on file  Occupational History   Occupation: [employer]    Comment: Therapist, art Rep   Occupation: disabled  Tobacco Use   Smoking status: Never   Smokeless tobacco: Never  Vaping Use   Vaping Use: Never used  Substance and Sexual Activity   Alcohol use: Yes    Alcohol/week: 1.0 standard drink of alcohol    Types: 1 Glasses of wine per week    Comment: Social   Drug use: No   Sexual activity: Yes  Other Topics Concern   Not on file  Social History Narrative   Mother is in a NH.    Patient lives at home with her husband Elta Guadel[place]). 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    High school education.   Right handed.   Caffeine 1 cup daily.   She has no children   Social Determinants of Health   Financial Resource Strain: Low Risk  (09/05/2022)   Overall Financial Resource Strain (CARDIA)    Difficulty of Paying Living Expenses: Not hard at all  Food Insecurity: No Food  Insecurity (09/05/2022)   Hunger Vital Sign    Worried About Running Out of Food in the Last Year: Never true    Ran Out of Food in the Last Year: Never true  Transportation Needs: No Transportation Needs (09/05/2022)   PRAPARE - Hydrologist (Medical): No    Lack of Transportation (Non-Medical): No  Physical Activity: Inactive (09/05/2022)   Exercise Vital Sign    Days of Exercise per Week: 0 days    Minutes of Exercise per Session: 0 min  Stress: No Stress Concern Present (09/05/2022)   [place]    Feeling of Stress : Not at all  Social Connections: Moderately Isolated (09/05/2022)   Social Connection and Isolation Panel [NHANES]    Frequency of Communication with Friends and Family: More than three times a week    Frequency of Social Gatherings with Friends and Family: More than three times a week    Attends Religious Services: Never    Marine scientist or Organizations: No    Attends Archivist Meetings: Never    Marital Status: Married  Human resources officer Violence: Not At Risk (09/05/2022)   Humiliation, Afraid, Rape, and Kick questionnaire    Fear of Current or Ex-Partner: No    Emotionally Abused: No    Physically Abused: No    Sexually Abused: No    Physical Exam: Vital signs in last 24 hours: @BP$  118/71   Pulse 88   Temp (!) 97.5 F (36.4 C)   Ht 4' 11"$  (1.499 m)   Wt 130 lb (59 kg)   SpO2 93%   BMI 26.26 kg/m  GEN: NAD EYE: Sclerae anicteric ENT: MMM CV: Non-tachycardic Pulm: CTA b/l GI: Soft, NT/ND NEURO:  Alert & Oriented x 3   Zenovia Jarred, MD [facility]  01/02/2023 2:03 PM

## 2023-01-02 NOTE — Op Note (Signed)
[facility] Patient Name: Foy Galey Procedure Date: 01/02/2023 2:08 PM MRN: [id number] Endoscopist: Jerene Bears , MD, [id number] Age: 57 Referring MD:  Date of Birth: Aug 21, 1966 Gender: Female Account #: [id number] Procedure:                Colonoscopy Indications:              Iron deficiency anemia Medicines:                Monitored Anesthesia Care Procedure:                Pre-Anesthesia Assessment:                           - Prior to the procedure, a History and Physical                            was performed, and patient medications and                            allergies were reviewed. The patient's tolerance of                            previous anesthesia was also reviewed. The risks                            and benefits of the procedure and the sedation                            options and risks were discussed with the patient.                            All questions were answered, and informed consent                            was obtained. Prior Anticoagulants: The patient has                            taken no anticoagulant or antiplatelet agents. ASA                            Grade Assessment: III - A patient with severe                            systemic disease. After reviewing the risks and                            benefits, the patient was deemed in satisfactory                            condition to undergo the procedure.                           After obtaining informed consent, the colonoscope  was passed under direct vision. Throughout the                            procedure, the patient's blood pressure, pulse, and                            oxygen saturations were monitored continuously. The                            Olympus [id number]) Colonoscope was                            introduced through the anus and advanced to the                            cecum, identified by appendiceal  orifice and                            ileocecal valve. The colonoscopy was performed                            without difficulty. The patient tolerated the                            procedure well. The quality of the bowel                            preparation was good. The ileocecal valve,                            appendiceal orifice, and rectum were photographed. Scope In: 2:22:53 PM Scope Out: [id number] PM Scope Withdrawal Time: 0 hours 10 minutes 2 seconds  Total Procedure Duration: 0 hours 14 minutes 19 seconds  Findings:                 The digital rectal exam was normal.                           The entire examined colon appeared normal on direct                            and retroflexion views. Complications:            No immediate complications. Estimated Blood Loss:     Estimated blood loss: none. Impression:               - The entire examined colon is normal on direct and                            retroflexion views.                           - No specimens collected. Recommendation:           - Patient has a contact number available for  emergencies. The signs and symptoms of potential                            delayed complications were discussed with the                            patient. Return to normal activities tomorrow.                            Written discharge instructions were provided to the                            patient.                           - Resume previous diet.                           - Continue present medications.                           - Repeat colonoscopy in 10 years for screening                            purposes.                           - See the other procedure note for documentation of                            additional recommendations. Jerene Bears, MD 01/02/2023 2:46:25 PM This report has been signed electronically.

## 2023-01-02 NOTE — Patient Instructions (Signed)
Thank you for coming in to see [place] today! Resume your regular medications/supplements today. Continue to take Pantoprazole 40 mg twice per day before meals. Resume Iron by mouth.  Repeat blood work in 3 months.  Biopsy results will be available in about one week.  We will notify you if something needs further treatment.      YOU HAD AN ENDOSCOPIC PROCEDURE TODAY AT [facility]:   Refer to the procedure report that was given to you for any specific questions about what was found during the examination.  If the procedure report does not answer your questions, please call your gastroenterologist to clarify.  If you requested that your care partner not be given the details of your procedure findings, then the procedure report has been included in a sealed envelope for you to review at your convenience later.  YOU SHOULD EXPECT: Some feelings of bloating in the abdomen. Passage of more gas than usual.  Walking can help get rid of the air that was put into your GI tract during the procedure and reduce the bloating. If you had a lower endoscopy (such as a colonoscopy or flexible sigmoidoscopy) you may notice spotting of blood in your stool or on the toilet paper. If you underwent a bowel prep for your procedure, you may not have a normal bowel movement for a few days.  Please Note:  You might notice some irritation and congestion in your nose or some drainage.  This is from the oxygen used during your procedure.  There is no need for concern and it should clear up in a day or so.  SYMPTOMS TO REPORT IMMEDIATELY:  Following lower endoscopy (colonoscopy or flexible sigmoidoscopy):  Excessive amounts of blood in the stool  Significant tenderness or worsening of abdominal pains  Swelling of the abdomen that is new, acute  Fever of 100F or higher  Following upper endoscopy (EGD)  Vomiting of blood or coffee ground material  New chest pain or pain under the shoulder blades  Painful  or persistently difficult swallowing  New shortness of breath  Fever of 100F or higher  Black, tarry-looking stools  For urgent or emergent issues, a gastroenterologist can be reached at any hour by calling [phone]. Do not use MyChart messaging for urgent concerns.    DIET:  We do recommend a small meal at first, but then you may proceed to your regular diet.  Drink plenty of fluids but you should avoid alcoholic beverages for 24 hours.  ACTIVITY:  You should plan to take it easy for the rest of today and you should NOT DRIVE or use heavy machinery until tomorrow (because of the sedation medicines used during the test).    FOLLOW UP: Our staff will call the number listed on your records the next business day following your procedure.  We will call around 7:15- 8:00 am to check on you and address any questions or concerns that you may have regarding the information given to you following your procedure. If we do not reach you, we will leave a message.     If any biopsies were taken you will be contacted by phone or by letter within the next 1-3 weeks.  Please call us at [phone] if you have not heard about the biopsies in 3 weeks.    SIGNATURES/CONFIDENTIALITY: You and/or your care partner have signed paperwork which will be entered into your electronic medical record.  These signatures attest to the fact that that  the information above on your After Visit Summary has been reviewed and is understood.  Full responsibility of the confidentiality of this discharge information lies with you and/or your care-partner.

## 2023-01-02 NOTE — Progress Notes (Signed)
Report to pacu rn. Vss. Care resumed by rn. 

## 2023-01-02 NOTE — Op Note (Signed)
[facility] Patient Name: Samantha [name] Procedure Date: 01/02/2023 2:09 PM MRN: [id number] Endoscopist: Jerene Bears , MD, [id number] Age: 57 Referring MD:  Date of Birth: 26-Jan-1966 Gender: Female Account #: [id number] Procedure:                Upper GI endoscopy Indications:              Iron deficiency anemia, Follow-up of reflux                            esophagitis with ulceration and Cameron's                            ulcerations seen in June 2023 at EGD (done by Dr.                            Lorenso Courier); intermittent solid food dysphagia Medicines:                Monitored Anesthesia Care Procedure:                Pre-Anesthesia Assessment:                           - Prior to the procedure, a History and Physical                            was performed, and patient medications and                            allergies were reviewed. The patient's tolerance of                            previous anesthesia was also reviewed. The risks                            and benefits of the procedure and the sedation                            options and risks were discussed with the patient.                            All questions were answered, and informed consent                            was obtained. Prior Anticoagulants: The patient has                            taken no anticoagulant or antiplatelet agents. ASA                            Grade Assessment: III - A patient with severe                            systemic disease. After reviewing the risks and  benefits, the patient was deemed in satisfactory                            condition to undergo the procedure.                           After obtaining informed consent, the endoscope was                            passed under direct vision. Throughout the                            procedure, the patient's blood pressure, pulse, and                            oxygen saturations were  monitored continuously. The                            GIF [id number] was introduced through the                            mouth, and advanced to the second part of duodenum.                            The upper GI endoscopy was accomplished without                            difficulty. The patient tolerated the procedure                            well. Scope In: Scope Out: Findings:                 Normal mucosa was found in the entire esophagus. No                            esophagitis.                           One benign-appearing, intrinsic moderate                            (circumferential scarring or stenosis; an endoscope                            may pass) stenosis was found 33 cm from the                            incisors. This stenosis measured 1.3 cm (inner                            diameter) x less than one cm (in length). The                            stenosis was traversed. A TTS dilator was passed  through the scope. Dilation with a 15-16.5-18 mm                            balloon dilator was performed to 18 mm. The                            dilation site was examined and showed moderate                            mucosal disruption.                           A 3 cm hiatal hernia was present. Possible                            paraesophageal component.                           The entire examined stomach was normal. Cameron's                            ulcerations have healed.                           The examined duodenum was normal. Biopsies for                            histology were taken with a cold forceps for                            evaluation of celiac disease. Complications:            No immediate complications. Estimated Blood Loss:     Estimated blood loss was minimal. Impression:               - Normal mucosa was found in the entire esophagus.                           - Benign-appearing esophageal stenosis.  Dilated to                            18 mm with balloon.                           - 3 cm hiatal hernia.                           - Normal stomach.                           - Normal examined duodenum. Biopsied. Recommendation:           - Patient has a contact number available for                            emergencies. The signs and symptoms of potential  delayed complications were discussed with the                            patient. Return to normal activities tomorrow.                            Written discharge instructions were provided to the                            patient.                           - Advance diet as tolerated.                           - Continue present medications. Continue                            pantoprazole 40 mg BID-AC.                           - Resume oral iron. If refractory IDA then IV iron                            should be considered along with hiatal hernia                            repair.                           - Repeat CBC, ferritin + IBC in 3 months                           - Await pathology results. Jerene Bears, MD 01/02/2023 2:44:56 PM This report has been signed electronically.

## 2023-01-02 NOTE — Progress Notes (Signed)
Called to room to assist during endoscopic procedure.  Patient ID and intended procedure confirmed with present staff. Received instructions for my participation in the procedure from the performing physician.  

## 2023-01-03 ENCOUNTER — Other Ambulatory Visit: Payer: Self-pay

## 2023-01-03 ENCOUNTER — Telehealth: Payer: Self-pay

## 2023-01-03 DIAGNOSIS — D509 Iron deficiency anemia, unspecified: Secondary | ICD-10-CM

## 2023-01-03 NOTE — Telephone Encounter (Signed)
Left message on answering machine. 

## 2023-01-10 ENCOUNTER — Encounter: Payer: Self-pay | Admitting: Internal Medicine

## 2023-01-18 ENCOUNTER — Other Ambulatory Visit: Payer: Self-pay | Admitting: *Deleted

## 2023-01-18 MED ORDER — ACCRUFER 30 MG PO CAPS: ORAL_CAPSULE | ORAL | 5 refills | Status: DC

## 2023-01-20 DIAGNOSIS — M47816 Spondylosis without myelopathy or radiculopathy, lumbar region: Secondary | ICD-10-CM | POA: Diagnosis not present

## 2023-01-20 DIAGNOSIS — M7061 Trochanteric bursitis, right hip: Secondary | ICD-10-CM | POA: Diagnosis not present

## 2023-01-21 ENCOUNTER — Other Ambulatory Visit: Payer: Self-pay | Admitting: Internal Medicine

## 2023-01-25 ENCOUNTER — Ambulatory Visit (INDEPENDENT_AMBULATORY_CARE_PROVIDER_SITE_OTHER): Payer: Medicare HMO | Admitting: Internal Medicine

## 2023-01-25 ENCOUNTER — Encounter: Payer: Self-pay | Admitting: Internal Medicine

## 2023-01-25 VITALS — BP 130/84 | HR 105 | Temp 98.0°F | Ht 59.0 in | Wt 142.0 lb

## 2023-01-25 DIAGNOSIS — R413 Other amnesia: Secondary | ICD-10-CM | POA: Diagnosis not present

## 2023-01-25 DIAGNOSIS — Z8673 Personal history of transient ischemic attack (TIA), and cerebral infarction without residual deficits: Secondary | ICD-10-CM | POA: Diagnosis not present

## 2023-01-25 DIAGNOSIS — G8929 Other chronic pain: Secondary | ICD-10-CM

## 2023-01-25 DIAGNOSIS — I639 Cerebral infarction, unspecified: Secondary | ICD-10-CM

## 2023-01-25 DIAGNOSIS — I10 Essential (primary) hypertension: Secondary | ICD-10-CM

## 2023-01-25 MED ORDER — MORPHINE SULFATE ER 30 MG PO TBCR: 30.0000 mg | EXTENDED_RELEASE_TABLET | Freq: Two times a day (BID) | ORAL | 0 refills | Status: DC

## 2023-01-25 MED ORDER — OXYCODONE HCL 10 MG PO TABS: 10.0000 mg | ORAL_TABLET | Freq: Three times a day (TID) | ORAL | 0 refills | Status: DC | PRN

## 2023-01-25 NOTE — Assessment & Plan Note (Signed)
Off ASA due to GI bleed 2023

## 2023-01-25 NOTE — Assessment & Plan Note (Signed)
?  etiology short term memory loss Started before meds per pt Hold Lipitor H/o Alzheimer's in the family

## 2023-01-25 NOTE — Assessment & Plan Note (Signed)
Cont Benicar 20 mg/d

## 2023-01-25 NOTE — Progress Notes (Signed)
Subjective:  Patient ID: Samantha [name], female    DOB: 1966-08-20  Age: 57 y.o. MRN: [id number]  CC: No chief complaint on file.   HPI SHAINE MERLE presents for chronic pain, memory problems, HTN  Outpatient Medications Prior to Visit  Medication Sig Dispense Refill   atorvastatin (LIPITOR) 20 MG tablet TAKE 1 TABLET EVERY DAY AT 6 PM 90 tablet 3   busPIRone (BUSPAR) 10 MG tablet TAKE 1 TABLET THREE TIMES DAILY 270 tablet 3   Calcium Carbonate-Vitamin D (CALCIUM-VITAMIN D) 500-200 MG-UNIT per tablet Take 1 tablet by mouth 2 (two) times daily with a meal.     Docusate Calcium (STOOL SOFTENER PO) Take 1 capsule by mouth 2 (two) times daily.     donepezil (ARICEPT) 10 MG tablet TAKE 1 TABLET AT BEDTIME (Patient taking differently: Take 10 mg by mouth at bedtime.) 90 tablet 3   DULoxetine (CYMBALTA) 60 MG capsule TAKE 1 CAPSULE EVERY DAY 90 capsule 3   famotidine (PEPCID) 40 MG tablet TAKE 1 TABLET EVERY DAY 90 tablet 3   Ferric Maltol (ACCRUFER) 30 MG CAPS 1 po bid 60 capsule 5   gabapentin (NEURONTIN) 300 MG capsule Take 300 mg by mouth 3 (three) times daily.     gabapentin (NEURONTIN) 600 MG tablet Take 600 mg by mouth 3 (three) times daily.     lactulose (CHRONULAC) 10 GM/15ML solution Take 30-45 mLs (20-30 g total) by mouth 2 (two) times daily as needed for moderate constipation or severe constipation. 946 mL 5   lamoTRIgine (LAMICTAL) 100 MG tablet TAKE 1 TABLET EVERY MORNING, AND 1 AND 1/2 TABLETS AT NIGHT. 225 tablet 3   lidocaine (LIDODERM) 5 % Place 1 patch onto the skin daily. Remove & Discard patch within 12 hours or as directed by MD 180 patch 1   linaclotide (LINZESS) 290 MCG CAPS capsule TAKE 1 CAPSULE DAILY BEFORE BREAKFAST 90 capsule 2   memantine (NAMENDA) 5 MG tablet TAKE 1 TABLET TWICE DAILY 180 tablet 3   morphine (MS CONTIN) 30 MG 12 hr tablet Take 1 tablet (30 mg total) by mouth every 12 (twelve) hours. 60 tablet 0   olmesartan (BENICAR) 20 MG tablet Take 1 tablet  (20 mg total) by mouth daily. 90 tablet 3   pantoprazole (PROTONIX) 40 MG tablet Take 1 tablet (40 mg total) by mouth 2 (two) times daily before a meal. 180 tablet 3   Probiotic Product (ALIGN) 4 MG CAPS Take 1 capsule (4 mg total) by mouth daily. (Patient taking differently: Take 0.5 capsules by mouth daily.) 90 capsule 1   promethazine (PHENERGAN) 12.5 MG tablet Take 1 tablet (12.5 mg total) by mouth every 6 (six) hours as needed for nausea or vomiting. 40 tablet 0   Oxycodone HCl 10 MG TABS Take 1 tablet (10 mg total) by mouth 3 (three) times daily as needed. 90 tablet 0   sucralfate (CARAFATE) 1 g tablet Take 1 tablet (1 g total) by mouth 4 (four) times daily for 28 days. 112 tablet 0   No facility-administered medications prior to visit.    ROS: Review of Systems  Constitutional:  Positive for fatigue. Negative for activity change, appetite change, chills and unexpected weight change.  HENT:  Negative for congestion, mouth sores and sinus pressure.   Eyes:  Negative for visual disturbance.  Respiratory:  Negative for cough and chest tightness.   Gastrointestinal:  Negative for abdominal pain and nausea.  Genitourinary:  Negative for difficulty urinating, frequency and  vaginal pain.  Musculoskeletal:  Positive for back pain. Negative for gait problem.  Skin:  Negative for pallor and rash.  Neurological:  Positive for weakness. Negative for dizziness, tremors, numbness and headaches.  Psychiatric/Behavioral:  Positive for decreased concentration. Negative for confusion and sleep disturbance. The patient is nervous/anxious.     Objective:  BP 130/84 (BP Location: Left Arm, Patient Position: Sitting, Cuff Size: Large)   Pulse (!) 105   Temp 98 F (36.7 C) (Oral)   Ht '4\' 11"'$  (1.499 m)   Wt 142 lb (64.4 kg)   SpO2 95%   BMI 28.68 kg/m   BP Readings from Last 3 Encounters:  01/25/23 130/84  01/02/23 114/62  11/24/22 126/80    Wt Readings from Last 3 Encounters:  01/25/23 142  lb (64.4 kg)  01/02/23 130 lb (59 kg)  11/30/22 130 lb (59 kg)    Physical Exam Constitutional:      General: She is not in acute distress.    Appearance: She is well-developed. She is obese.  HENT:     Head: Normocephalic.     Right Ear: External ear normal.     Left Ear: External ear normal.     Nose: Nose normal.  Eyes:     General:        Right eye: No discharge.        Left eye: No discharge.     Conjunctiva/sclera: Conjunctivae normal.     Pupils: Pupils are equal, round, and reactive to light.  Neck:     Thyroid: No thyromegaly.     Vascular: No JVD.     Trachea: No tracheal deviation.  Cardiovascular:     Rate and Rhythm: Normal rate and regular rhythm.     Heart sounds: Normal heart sounds.  Pulmonary:     Effort: No respiratory distress.     Breath sounds: No stridor. No wheezing.  Abdominal:     General: Bowel sounds are normal. There is no distension.     Palpations: Abdomen is soft. There is no mass.     Tenderness: There is no abdominal tenderness. There is no guarding or rebound.  Musculoskeletal:        General: Tenderness present.     Cervical back: Normal range of motion and neck supple. No rigidity.  Lymphadenopathy:     Cervical: No cervical adenopathy.  Skin:    Findings: No erythema or rash.  Neurological:     Cranial Nerves: No cranial nerve deficit.     Motor: No abnormal muscle tone.     Coordination: Coordination normal.     Deep Tendon Reflexes: Reflexes normal.  Psychiatric:        Behavior: Behavior normal.        Thought Content: Thought content normal.        Judgment: Judgment normal.     Lab Results  Component Value Date   WBC 8.5 09/22/2022   HGB 12.9 09/22/2022   HCT 38.0 09/22/2022   PLT 550.0 (H) 09/22/2022   GLUCOSE 97 09/22/2022   CHOL 217 (H) 09/02/2021   TRIG 204.0 (H) 09/02/2021   HDL 52.80 09/02/2021   LDLDIRECT 120.0 09/02/2021   LDLCALC 85 07/16/2020   ALT 16 09/22/2022   AST 19 09/22/2022   NA 138  09/22/2022   K 4.2 09/22/2022   CL 102 09/22/2022   CREATININE 1.03 09/22/2022   BUN 10 09/22/2022   CO2 30 09/22/2022   TSH 3.09 09/02/2021   INR  1.0 05/02/2022   HGBA1C 5.7 01/12/2022    CT Cervical Spine Wo Contrast  Result Date: 05/01/2022 CLINICAL DATA:  Neck trauma, midline tenderness (Age 56-64y) fall EXAM: CT CERVICAL SPINE WITHOUT CONTRAST TECHNIQUE: Multidetector CT imaging of the cervical spine was performed without intravenous contrast. Multiplanar CT image reconstructions were also generated. RADIATION DOSE REDUCTION: This exam was performed according to the departmental dose-optimization program which includes automated exposure control, adjustment of the mA and/or kV according to patient size and/or use of iterative reconstruction technique. COMPARISON:  MRI cervical spine 03/02/2013 FINDINGS: Alignment: Normal. Skull base and vertebrae: Prominent left C5 transverse foramina likely due to a tortuous vertebral artery. No acute fracture. No aggressive appearing focal osseous lesion or focal pathologic process. Soft tissues and spinal canal: No prevertebral fluid or swelling. No visible canal hematoma. Upper chest: Unremarkable. Other: None. IMPRESSION: No acute displaced fracture or traumatic listhesis of the cervical spine. Electronically Signed   By: Iven Finn M.D.   On: 05/01/2022 22:21   CT Head Wo Contrast  Result Date: 05/01/2022 CLINICAL DATA:  Mental status change, unknown cause EXAM: CT HEAD WITHOUT CONTRAST TECHNIQUE: Contiguous axial images were obtained from the base of the skull through the vertex without intravenous contrast. RADIATION DOSE REDUCTION: This exam was performed according to the departmental dose-optimization program which includes automated exposure control, adjustment of the mA and/or kV according to patient size and/or use of iterative reconstruction technique. COMPARISON:  CT head 10/21/2015 BRAIN: BRAIN Similar-appearing right posterior porch  ventriculoperitoneal shunt with tip again noted to terminate within the inferior horn of the right lateral ventricle. Right frontotemporal encephalomalacia. No evidence of large-territorial acute infarction. No parenchymal hemorrhage. No mass lesion. No extra-axial collection. No mass effect or midline shift. Similar appearance of the lateral ventricles and third ventricle. Basilar cisterns are patent. Vascular: No hyperdense vessel. Vascular clipping again noted at the central skull base. Skull: No acute fracture or focal lesion.  Right frontal craniotomy. Sinuses/Orbits: Paranasal sinuses and mastoid air cells are clear. The orbits are unremarkable. Other: None. IMPRESSION: No acute intracranial abnormality in a patient with known right posterior approach ventriculoperitoneal shunt. Stable size of lateral and third ventricles compared to 2016. Electronically Signed   By: Iven Finn M.D.   On: 05/01/2022 21:33   DG Chest Portable 1 View  Result Date: 05/01/2022 CLINICAL DATA:  Altered mental status EXAM: PORTABLE CHEST 1 VIEW COMPARISON:  08/29/2018 FINDINGS: Mild cardiomegaly. Small hiatal hernia. No focal airspace consolidation, pleural effusion, or pneumothorax. Right-sided VP shunt catheter with similar degree of irregularity of the catheter contours within the chest. Possible catheter discontinuity the level of the right sixth rib. IMPRESSION: 1. No acute cardiopulmonary disease. 2. Small hiatal hernia. 3. Possible VP shunt catheter discontinuity within the mid chest. Electronically Signed   By: Davina Poke D.O.   On: 05/01/2022 21:22    Assessment & Plan:   Problem List Items Addressed This Visit       Cardiovascular and Mediastinum   HTN (hypertension) - Primary    Cont Benicar 20 mg/d      Cerebrovascular accident ([place])    Off ASA due to GI bleed 2023        Other   Memory loss    ?etiology short term memory loss Started before meds per pt Hold Lipitor H/o Alzheimer's in  the family      Chronic pain    Post-thalamic CVA pain syndrome Chronic pain in the back, hips - I took  over [name]'s pain management (she was treated at [facility] in the past). On Oxycodone and MS Contin   Potential benefits of a long term opioids use as well as potential risks (i.e. addiction risk, apnea etc) and complications (i.e. Somnolence, constipation and others) were explained to the patient and were aknowledged.      Relevant Medications   Oxycodone HCl 10 MG TABS   morphine (MS CONTIN) 30 MG 12 hr tablet   morphine (MS CONTIN) 30 MG 12 hr tablet   Oxycodone HCl 10 MG TABS      Meds ordered this encounter  Medications   Oxycodone HCl 10 MG TABS    Sig: Take 1 tablet (10 mg total) by mouth 3 (three) times daily as needed.    Dispense:  90 tablet    Refill:  0    Please fill on or after 02/25/23   morphine (MS CONTIN) 30 MG 12 hr tablet    Sig: Take 1 tablet (30 mg total) by mouth every 12 (twelve) hours.    Dispense:  60 tablet    Refill:  0    Please fill on or after 01/25/23   morphine (MS CONTIN) 30 MG 12 hr tablet    Sig: Take 1 tablet (30 mg total) by mouth every 12 (twelve) hours.    Dispense:  60 tablet    Refill:  0    Please fill on or after 02/25/23   Oxycodone HCl 10 MG TABS    Sig: Take 1 tablet (10 mg total) by mouth 3 (three) times daily as needed.    Dispense:  90 tablet    Refill:  0    Please fill on or after 01/25/23      Follow-up: Return in about 2 months (around 03/27/2023) for a follow-up visit.  Walker Kehr, MD

## 2023-01-25 NOTE — Assessment & Plan Note (Signed)
Post-thalamic CVA pain syndrome Chronic pain in the back, hips - I took over Samantha [name]'s pain management (she was treated at [facility] in the past). On Oxycodone and MS Contin   Potential benefits of a long term opioids use as well as potential risks (i.e. addiction risk, apnea etc) and complications (i.e. Somnolence, constipation and others) were explained to the patient and were aknowledged.

## 2023-02-08 ENCOUNTER — Ambulatory Visit (INDEPENDENT_AMBULATORY_CARE_PROVIDER_SITE_OTHER): Payer: Medicare HMO | Admitting: Internal Medicine

## 2023-02-08 ENCOUNTER — Encounter: Payer: Self-pay | Admitting: Internal Medicine

## 2023-02-08 VITALS — BP 102/70 | HR 95 | Temp 97.6°F | Ht 59.0 in | Wt 147.0 lb

## 2023-02-08 DIAGNOSIS — R5382 Chronic fatigue, unspecified: Secondary | ICD-10-CM

## 2023-02-08 DIAGNOSIS — R195 Other fecal abnormalities: Secondary | ICD-10-CM | POA: Diagnosis not present

## 2023-02-08 DIAGNOSIS — Z79899 Other long term (current) drug therapy: Secondary | ICD-10-CM | POA: Diagnosis not present

## 2023-02-08 DIAGNOSIS — G4733 Obstructive sleep apnea (adult) (pediatric): Secondary | ICD-10-CM

## 2023-02-08 MED ORDER — GABAPENTIN 600 MG PO TABS: ORAL_TABLET | ORAL | 3 refills | Status: DC

## 2023-02-08 NOTE — Assessment & Plan Note (Signed)
Monitor CBC 

## 2023-02-08 NOTE — Assessment & Plan Note (Addendum)
  Polypharmacy is contributing in chronic fatigue.  We will start reducing gabapentin slowly.

## 2023-02-08 NOTE — Addendum Note (Signed)
Addended by: Cassandria Anger on: 02/08/2023 05:19 PM   Modules accepted: Level of Service

## 2023-02-08 NOTE — Progress Notes (Addendum)
Subjective:  Patient ID: Samantha [name], female    DOB: [birth year]  Age: 57 y.o. MRN: [id number]  CC: Follow-up (fatigue) and Fatigue   HPI Samantha [name] presents for fatigue, somnolence, sleepy all day x 3 months On CPAP On Accrufer  Outpatient Medications Prior to Visit  Medication Sig Dispense Refill   atorvastatin (LIPITOR) 20 MG tablet TAKE 1 TABLET EVERY DAY AT 6 PM (Patient taking differently: Take 20 mg by mouth every evening.) 90 tablet 3   busPIRone (BUSPAR) 10 MG tablet TAKE 1 TABLET THREE TIMES DAILY (Patient taking differently: Take 10 mg by mouth 3 (three) times daily.) 270 tablet 3   Docusate Calcium (STOOL SOFTENER PO) Take 1 capsule by mouth 2 (two) times daily as needed (for constipation).     donepezil (ARICEPT) 10 MG tablet TAKE 1 TABLET AT BEDTIME (Patient taking differently: Take 10 mg by mouth at bedtime.) 90 tablet 3   DULoxetine (CYMBALTA) 60 MG capsule TAKE 1 CAPSULE EVERY DAY (Patient taking differently: Take 60 mg by mouth at bedtime.) 90 capsule 3   famotidine (PEPCID) 40 MG tablet TAKE 1 TABLET EVERY DAY (Patient taking differently: Take 40 mg by mouth daily as needed for heartburn or indigestion (if Protonix is ineffective).) 90 tablet 3   Ferric Maltol (ACCRUFER) 30 MG CAPS 1 po bid (Patient taking differently: Take 30 mg by mouth 2 (two) times daily.) 60 capsule 5   lactulose (CHRONULAC) 10 GM/15ML solution Take 30-45 mLs (20-30 g total) by mouth 2 (two) times daily as needed for moderate constipation or severe constipation. 946 mL 5   lamoTRIgine (LAMICTAL) 100 MG tablet TAKE 1 TABLET EVERY MORNING, AND 1 AND 1/2 TABLETS AT NIGHT. (Patient taking differently: Take 100-150 mg by mouth See admin instructions. Take 100 mg by mouth in the morning and 150 mg at bedtime) 225 tablet 3   lidocaine (LIDODERM) 5 % Place 1 patch onto the skin daily. Remove & Discard patch within 12 hours or as directed by MD (Patient taking differently: Place 1 patch onto the skin  daily as needed (for pain- Remove & Discard patch within 12 hours or as directed by MD).) 180 patch 1   linaclotide (LINZESS) 290 MCG CAPS capsule TAKE 1 CAPSULE DAILY BEFORE BREAKFAST (Patient taking differently: Take 290 mcg by mouth daily before breakfast.) 90 capsule 2   memantine (NAMENDA) 5 MG tablet TAKE 1 TABLET TWICE DAILY (Patient taking differently: Take 5 mg by mouth in the morning and at bedtime.) 180 tablet 3   morphine (MS CONTIN) 30 MG 12 hr tablet Take 1 tablet (30 mg total) by mouth every 12 (twelve) hours. (Patient taking differently: Take 30 mg by mouth See admin instructions. Take 30 mg by mouth at 9 AM and 9 PM) 60 tablet 0   Oxycodone HCl 10 MG TABS Take 1 tablet (10 mg total) by mouth 3 (three) times daily as needed. (Patient taking differently: Take 10 mg by mouth See admin instructions. Take 10 mg by mouth at 11 AM, 1 PM, and 3 PM) 90 tablet 0   Probiotic Product (ALIGN) 4 MG CAPS Take 1 capsule (4 mg total) by mouth daily. (Patient taking differently: Take 4 mg by mouth daily as needed (for stomach or constipation issues).) 90 capsule 1   promethazine (PHENERGAN) 12.5 MG tablet Take 1 tablet (12.5 mg total) by mouth every 6 (six) hours as needed for nausea or vomiting. 40 tablet 0   Calcium Carbonate-Vitamin D (CALCIUM-VITAMIN D) 500-200  MG-UNIT per tablet Take 1 tablet by mouth 2 (two) times daily with a meal.     gabapentin (NEURONTIN) 300 MG capsule Take 300 mg by mouth 3 (three) times daily.     gabapentin (NEURONTIN) 600 MG tablet Take 600 mg by mouth 3 (three) times daily.     morphine (MS CONTIN) 30 MG 12 hr tablet Take 1 tablet (30 mg total) by mouth every 12 (twelve) hours. (Patient not taking: Reported on 03/01/2023) 60 tablet 0   morphine (MS CONTIN) 30 MG 12 hr tablet Take 1 tablet (30 mg total) by mouth every 12 (twelve) hours. (Patient not taking: Reported on 03/01/2023) 60 tablet 0   olmesartan (BENICAR) 20 MG tablet Take 1 tablet (20 mg total) by mouth daily. 90  tablet 3   Oxycodone HCl 10 MG TABS Take 1 tablet (10 mg total) by mouth 3 (three) times daily as needed. (Patient not taking: Reported on 03/01/2023) 90 tablet 0   pantoprazole (PROTONIX) 40 MG tablet Take 1 tablet (40 mg total) by mouth 2 (two) times daily before a meal. (Patient taking differently: Take 40 mg by mouth daily before breakfast.) 180 tablet 3   sucralfate (CARAFATE) 1 g tablet Take 1 tablet (1 g total) by mouth 4 (four) times daily for 28 days. 112 tablet 0   No facility-administered medications prior to visit.    ROS: Review of Systems  Constitutional:  Positive for fatigue. Negative for activity change, appetite change, chills and unexpected weight change.  HENT:  Negative for congestion, mouth sores and sinus pressure.   Eyes:  Negative for visual disturbance.  Respiratory:  Negative for cough and chest tightness.   Gastrointestinal:  Negative for abdominal pain and nausea.  Genitourinary:  Negative for difficulty urinating, frequency and vaginal pain.  Musculoskeletal:  Positive for back pain. Negative for gait problem.  Skin:  Negative for pallor and rash.  Neurological:  Negative for dizziness, tremors, weakness, numbness and headaches.  Hematological:  Does not bruise/bleed easily.  Psychiatric/Behavioral:  Positive for sleep disturbance. Negative for confusion, decreased concentration and suicidal ideas. The patient is nervous/anxious.     Objective:  BP 102/70 (BP Location: Right Arm, Patient Position: Sitting, Cuff Size: Normal)   Pulse 95   Temp 97.6 F (36.4 C) (Oral)   Ht 4\' 11"  (1.499 m)   Wt 147 lb (66.7 kg)   SpO2 90%   BMI 29.69 kg/m   BP Readings from Last 3 Encounters:  03/14/23 126/76  03/03/23 (!) 167/80  02/28/23 (!) 79/53    Wt Readings from Last 3 Encounters:  03/14/23 146 lb 6 oz (66.4 kg)  03/03/23 152 lb 1.9 oz (69 kg)  02/08/23 147 lb (66.7 kg)    Physical Exam Constitutional:      General: She is not in acute distress.     Appearance: She is well-developed. She is obese.  HENT:     Head: Normocephalic.     Right Ear: External ear normal.     Left Ear: External ear normal.     Nose: Nose normal.  Eyes:     General:        Right eye: No discharge.        Left eye: No discharge.     Conjunctiva/sclera: Conjunctivae normal.     Pupils: Pupils are equal, round, and reactive to light.  Neck:     Thyroid: No thyromegaly.     Vascular: No JVD.     Trachea: No tracheal  deviation.  Cardiovascular:     Rate and Rhythm: Normal rate and regular rhythm.     Heart sounds: Normal heart sounds.  Pulmonary:     Effort: No respiratory distress.     Breath sounds: No stridor. No wheezing.  Abdominal:     General: Bowel sounds are normal. There is no distension.     Palpations: Abdomen is soft. There is no mass.     Tenderness: There is no abdominal tenderness. There is no guarding or rebound.  Musculoskeletal:        General: Tenderness present.     Cervical back: Normal range of motion and neck supple. No rigidity.  Lymphadenopathy:     Cervical: No cervical adenopathy.  Skin:    Findings: No erythema or rash.  Neurological:     Mental Status: She is oriented to person, place, and time.     Cranial Nerves: No cranial nerve deficit.     Motor: No abnormal muscle tone.     Coordination: Coordination normal.     Deep Tendon Reflexes: Reflexes normal.  Psychiatric:        Behavior: Behavior normal.        Thought Content: Thought content normal.        Judgment: Judgment normal.   Tearful, upset  Lab Results  Component Value Date   WBC 4.6 03/14/2023   HGB 11.1 (L) 03/14/2023   HCT 32.2 (L) 03/14/2023   PLT 395.0 03/14/2023   GLUCOSE 122 (H) 03/14/2023   CHOL 217 (H) 09/02/2021   TRIG 204.0 (H) 09/02/2021   HDL 52.80 09/02/2021   LDLDIRECT 120.0 09/02/2021   LDLCALC 85 07/16/2020   ALT 14 03/14/2023   AST 20 03/14/2023   NA 140 03/14/2023   K 3.7 03/14/2023   CL 102 03/14/2023   CREATININE 1.00  03/14/2023   BUN 11 03/14/2023   CO2 31 03/14/2023   TSH 3.09 09/02/2021   INR 1.1 02/28/2023   HGBA1C 5.7 01/12/2022    CT Cervical Spine Wo Contrast  Result Date: 05/01/2022 CLINICAL DATA:  Neck trauma, midline tenderness (Age 39-64y) fall EXAM: CT CERVICAL SPINE WITHOUT CONTRAST TECHNIQUE: Multidetector CT imaging of the cervical spine was performed without intravenous contrast. Multiplanar CT image reconstructions were also generated. RADIATION DOSE REDUCTION: This exam was performed according to the departmental dose-optimization program which includes automated exposure control, adjustment of the mA and/or kV according to patient size and/or use of iterative reconstruction technique. COMPARISON:  MRI cervical spine 03/02/2013 FINDINGS: Alignment: Normal. Skull base and vertebrae: Prominent left C5 transverse foramina likely due to a tortuous vertebral artery. No acute fracture. No aggressive appearing focal osseous lesion or focal pathologic process. Soft tissues and spinal canal: No prevertebral fluid or swelling. No visible canal hematoma. Upper chest: Unremarkable. Other: None. IMPRESSION: No acute displaced fracture or traumatic listhesis of the cervical spine. Electronically Signed   By: Tish Frederickson M.D.   On: 05/01/2022 22:21   CT Head Wo Contrast  Result Date: 05/01/2022 CLINICAL DATA:  Mental status change, unknown cause EXAM: CT HEAD WITHOUT CONTRAST TECHNIQUE: Contiguous axial images were obtained from the base of the skull through the vertex without intravenous contrast. RADIATION DOSE REDUCTION: This exam was performed according to the departmental dose-optimization program which includes automated exposure control, adjustment of the mA and/or kV according to patient size and/or use of iterative reconstruction technique. COMPARISON:  CT head 10/21/2015 BRAIN: BRAIN Similar-appearing right posterior porch ventriculoperitoneal shunt with tip again noted to terminate  within the  inferior horn of the right lateral ventricle. Right frontotemporal encephalomalacia. No evidence of large-territorial acute infarction. No parenchymal hemorrhage. No mass lesion. No extra-axial collection. No mass effect or midline shift. Similar appearance of the lateral ventricles and third ventricle. Basilar cisterns are patent. Vascular: No hyperdense vessel. Vascular clipping again noted at the central skull base. Skull: No acute fracture or focal lesion.  Right frontal craniotomy. Sinuses/Orbits: Paranasal sinuses and mastoid air cells are clear. The orbits are unremarkable. Other: None. IMPRESSION: No acute intracranial abnormality in a patient with known right posterior approach ventriculoperitoneal shunt. Stable size of lateral and third ventricles compared to 2016. Electronically Signed   By: Tish Frederickson M.D.   On: 05/01/2022 21:33   DG Chest Portable 1 View  Result Date: 05/01/2022 CLINICAL DATA:  Altered mental status EXAM: PORTABLE CHEST 1 VIEW COMPARISON:  08/29/2018 FINDINGS: Mild cardiomegaly. Small hiatal hernia. No focal airspace consolidation, pleural effusion, or pneumothorax. Right-sided VP shunt catheter with similar degree of irregularity of the catheter contours within the chest. Possible catheter discontinuity the level of the right sixth rib. IMPRESSION: 1. No acute cardiopulmonary disease. 2. Small hiatal hernia. 3. Possible VP shunt catheter discontinuity within the mid chest. Electronically Signed   By: Duanne Guess D.O.   On: 05/01/2022 21:22    Assessment & Plan:   Problem List Items Addressed This Visit     OSA on CPAP   Relevant Orders   Ambulatory referral to Pulmonology   Fatigue    Chronic fatigue-worse.  Discussed continue to use CPAP.  Polypharmacy is contributing.  We will start reducing gabapentin slowly.      Polypharmacy - Primary     Polypharmacy is contributing in chronic fatigue.  We will start reducing gabapentin slowly.      Occult GI  bleeding    Monitor CBC         Meds ordered this encounter  Medications   gabapentin (NEURONTIN) 600 MG tablet    Sig: Take one tablet at lunch and one at bedtime for 1 week, then one at night once a day    Dispense:  60 tablet    Refill:  3      Follow-up: Return in about 4 weeks (around 03/08/2023) for a follow-up visit.  Sonda Primes, MD

## 2023-02-15 DIAGNOSIS — M1711 Unilateral primary osteoarthritis, right knee: Secondary | ICD-10-CM | POA: Diagnosis not present

## 2023-02-15 DIAGNOSIS — M25561 Pain in right knee: Secondary | ICD-10-CM | POA: Diagnosis not present

## 2023-02-24 ENCOUNTER — Telehealth: Payer: Self-pay | Admitting: Internal Medicine

## 2023-02-24 NOTE — Telephone Encounter (Signed)
Patient called and said her olmesartan (BENICAR) 20 MG tablet seems to be giving her dizzy spells. She would like to know what Dr. Posey Rea would recommend. She would like a callback at [phone].

## 2023-02-24 NOTE — Telephone Encounter (Signed)
Called pt inform MD is out of the office today. She states its not urgent, but she notice when she take the Olmesartan she gets dizzy. Also pt states she still have cramps in her feet and legs. Wanting MD to recommend what she can take as well.Marland KitchenRaechel Chute

## 2023-02-26 NOTE — Telephone Encounter (Signed)
I doubt that olmesartan is doing it.  She can hold it for 2-3 days to see if the symptoms change. Thanks

## 2023-02-27 NOTE — Telephone Encounter (Signed)
Called pt she states she held the Olmesartan for 3 days was ok , but was afraid BP was elevated started back taking and that's when dizziness started back.Marland KitchenRaechel Chute

## 2023-02-28 ENCOUNTER — Emergency Department (HOSPITAL_COMMUNITY): Payer: Medicare HMO

## 2023-02-28 ENCOUNTER — Inpatient Hospital Stay (HOSPITAL_COMMUNITY)
Admission: EM | Admit: 2023-02-28 | Discharge: 2023-03-03 | DRG: 377 | Disposition: A | Discharge status: Home / Self Care | Payer: Medicare HMO | Source: Ambulatory Visit | Attending: Internal Medicine | Admitting: Internal Medicine

## 2023-02-28 ENCOUNTER — Encounter (HOSPITAL_COMMUNITY): Payer: Self-pay

## 2023-02-28 ENCOUNTER — Encounter: Payer: Self-pay | Admitting: Internal Medicine

## 2023-02-28 ENCOUNTER — Encounter (HOSPITAL_COMMUNITY): Payer: Self-pay | Admitting: *Deleted

## 2023-02-28 ENCOUNTER — Other Ambulatory Visit: Payer: Self-pay

## 2023-02-28 ENCOUNTER — Ambulatory Visit (HOSPITAL_COMMUNITY)
Admission: EM | Admit: 2023-02-28 | Discharge: 2023-02-28 | Disposition: A | Discharge status: Home / Self Care | Payer: Medicare HMO

## 2023-02-28 DIAGNOSIS — G4733 Obstructive sleep apnea (adult) (pediatric): Secondary | ICD-10-CM

## 2023-02-28 DIAGNOSIS — G911 Obstructive hydrocephalus: Secondary | ICD-10-CM | POA: Diagnosis not present

## 2023-02-28 DIAGNOSIS — Z8249 Family history of ischemic heart disease and other diseases of the circulatory system: Secondary | ICD-10-CM

## 2023-02-28 DIAGNOSIS — K222 Esophageal obstruction: Secondary | ICD-10-CM | POA: Diagnosis present

## 2023-02-28 DIAGNOSIS — Z982 Presence of cerebrospinal fluid drainage device: Secondary | ICD-10-CM

## 2023-02-28 DIAGNOSIS — J189 Pneumonia, unspecified organism: Secondary | ICD-10-CM | POA: Diagnosis not present

## 2023-02-28 DIAGNOSIS — G894 Chronic pain syndrome: Secondary | ICD-10-CM | POA: Diagnosis present

## 2023-02-28 DIAGNOSIS — E785 Hyperlipidemia, unspecified: Secondary | ICD-10-CM | POA: Diagnosis present

## 2023-02-28 DIAGNOSIS — I10 Essential (primary) hypertension: Secondary | ICD-10-CM | POA: Diagnosis not present

## 2023-02-28 DIAGNOSIS — K922 Gastrointestinal hemorrhage, unspecified: Secondary | ICD-10-CM

## 2023-02-28 DIAGNOSIS — G919 Hydrocephalus, unspecified: Secondary | ICD-10-CM | POA: Diagnosis not present

## 2023-02-28 DIAGNOSIS — Z808 Family history of malignant neoplasm of other organs or systems: Secondary | ICD-10-CM

## 2023-02-28 DIAGNOSIS — J690 Pneumonitis due to inhalation of food and vomit: Secondary | ICD-10-CM

## 2023-02-28 DIAGNOSIS — K259 Gastric ulcer, unspecified as acute or chronic, without hemorrhage or perforation: Secondary | ICD-10-CM | POA: Diagnosis not present

## 2023-02-28 DIAGNOSIS — E869 Volume depletion, unspecified: Secondary | ICD-10-CM | POA: Diagnosis present

## 2023-02-28 DIAGNOSIS — J9601 Acute respiratory failure with hypoxia: Secondary | ICD-10-CM

## 2023-02-28 DIAGNOSIS — K3189 Other diseases of stomach and duodenum: Secondary | ICD-10-CM | POA: Diagnosis not present

## 2023-02-28 DIAGNOSIS — R5383 Other fatigue: Secondary | ICD-10-CM | POA: Diagnosis not present

## 2023-02-28 DIAGNOSIS — D62 Acute posthemorrhagic anemia: Secondary | ICD-10-CM | POA: Diagnosis not present

## 2023-02-28 DIAGNOSIS — Z800 Family history of malignant neoplasm of digestive organs: Secondary | ICD-10-CM

## 2023-02-28 DIAGNOSIS — K449 Diaphragmatic hernia without obstruction or gangrene: Secondary | ICD-10-CM | POA: Diagnosis not present

## 2023-02-28 DIAGNOSIS — K250 Acute gastric ulcer with hemorrhage: Secondary | ICD-10-CM | POA: Diagnosis not present

## 2023-02-28 DIAGNOSIS — Z1152 Encounter for screening for COVID-19: Secondary | ICD-10-CM | POA: Diagnosis not present

## 2023-02-28 DIAGNOSIS — I959 Hypotension, unspecified: Secondary | ICD-10-CM | POA: Diagnosis not present

## 2023-02-28 DIAGNOSIS — K219 Gastro-esophageal reflux disease without esophagitis: Secondary | ICD-10-CM | POA: Diagnosis present

## 2023-02-28 DIAGNOSIS — K5909 Other constipation: Secondary | ICD-10-CM | POA: Diagnosis present

## 2023-02-28 DIAGNOSIS — R55 Syncope and collapse: Secondary | ICD-10-CM | POA: Diagnosis not present

## 2023-02-28 DIAGNOSIS — Z79891 Long term (current) use of opiate analgesic: Secondary | ICD-10-CM

## 2023-02-28 DIAGNOSIS — D509 Iron deficiency anemia, unspecified: Secondary | ICD-10-CM | POA: Diagnosis present

## 2023-02-28 DIAGNOSIS — E871 Hypo-osmolality and hyponatremia: Secondary | ICD-10-CM | POA: Diagnosis not present

## 2023-02-28 DIAGNOSIS — R413 Other amnesia: Secondary | ICD-10-CM | POA: Diagnosis present

## 2023-02-28 DIAGNOSIS — Z820 Family history of epilepsy and other diseases of the nervous system: Secondary | ICD-10-CM

## 2023-02-28 DIAGNOSIS — G8929 Other chronic pain: Secondary | ICD-10-CM | POA: Diagnosis not present

## 2023-02-28 DIAGNOSIS — K254 Chronic or unspecified gastric ulcer with hemorrhage: Principal | ICD-10-CM | POA: Diagnosis present

## 2023-02-28 DIAGNOSIS — E861 Hypovolemia: Secondary | ICD-10-CM | POA: Diagnosis not present

## 2023-02-28 DIAGNOSIS — I129 Hypertensive chronic kidney disease with stage 1 through stage 4 chronic kidney disease, or unspecified chronic kidney disease: Secondary | ICD-10-CM | POA: Diagnosis present

## 2023-02-28 DIAGNOSIS — N1831 Chronic kidney disease, stage 3a: Secondary | ICD-10-CM | POA: Diagnosis present

## 2023-02-28 DIAGNOSIS — Z8719 Personal history of other diseases of the digestive system: Secondary | ICD-10-CM

## 2023-02-28 DIAGNOSIS — G40209 Localization-related (focal) (partial) symptomatic epilepsy and epileptic syndromes with complex partial seizures, not intractable, without status epilepticus: Secondary | ICD-10-CM | POA: Diagnosis present

## 2023-02-28 DIAGNOSIS — Z8051 Family history of malignant neoplasm of kidney: Secondary | ICD-10-CM

## 2023-02-28 DIAGNOSIS — K920 Hematemesis: Secondary | ICD-10-CM | POA: Diagnosis not present

## 2023-02-28 DIAGNOSIS — Z79899 Other long term (current) drug therapy: Secondary | ICD-10-CM

## 2023-02-28 DIAGNOSIS — R059 Cough, unspecified: Secondary | ICD-10-CM | POA: Diagnosis present

## 2023-02-28 DIAGNOSIS — F4323 Adjustment disorder with mixed anxiety and depressed mood: Secondary | ICD-10-CM | POA: Diagnosis not present

## 2023-02-28 DIAGNOSIS — Z801 Family history of malignant neoplasm of trachea, bronchus and lung: Secondary | ICD-10-CM

## 2023-02-28 DIAGNOSIS — N179 Acute kidney failure, unspecified: Principal | ICD-10-CM

## 2023-02-28 DIAGNOSIS — Z9049 Acquired absence of other specified parts of digestive tract: Secondary | ICD-10-CM

## 2023-02-28 DIAGNOSIS — D649 Anemia, unspecified: Secondary | ICD-10-CM

## 2023-02-28 DIAGNOSIS — R918 Other nonspecific abnormal finding of lung field: Secondary | ICD-10-CM | POA: Diagnosis not present

## 2023-02-28 DIAGNOSIS — Z83719 Family history of colon polyps, unspecified: Secondary | ICD-10-CM

## 2023-02-28 DIAGNOSIS — K529 Noninfective gastroenteritis and colitis, unspecified: Secondary | ICD-10-CM | POA: Diagnosis not present

## 2023-02-28 DIAGNOSIS — G4730 Sleep apnea, unspecified: Secondary | ICD-10-CM | POA: Diagnosis not present

## 2023-02-28 DIAGNOSIS — Z8673 Personal history of transient ischemic attack (TIA), and cerebral infarction without residual deficits: Secondary | ICD-10-CM

## 2023-02-28 DIAGNOSIS — K921 Melena: Secondary | ICD-10-CM | POA: Diagnosis not present

## 2023-02-28 DIAGNOSIS — Z833 Family history of diabetes mellitus: Secondary | ICD-10-CM

## 2023-02-28 LAB — MAGNESIUM: Magnesium: 1.8 mg/dL (ref 1.7–2.4)

## 2023-02-28 LAB — I-STAT CHEM 8, ED
BUN: 92 mg/dL — ABNORMAL HIGH (ref 6–20)
Calcium, Ion: 0.95 mmol/L — ABNORMAL LOW (ref 1.15–1.40)
Chloride: 102 mmol/L (ref 98–111)
Creatinine, Ser: 4.9 mg/dL — ABNORMAL HIGH (ref 0.44–1.00)
Glucose, Bld: 87 mg/dL (ref 70–99)
HCT: 19 % — ABNORMAL LOW (ref 36.0–46.0)
Hemoglobin: 6.5 g/dL — CL (ref 12.0–15.0)
Potassium: 3.4 mmol/L — ABNORMAL LOW (ref 3.5–5.1)
Sodium: 131 mmol/L — ABNORMAL LOW (ref 135–145)
TCO2: 18 mmol/L — ABNORMAL LOW (ref 22–32)

## 2023-02-28 LAB — I-STAT VENOUS BLOOD GAS, ED
Acid-base deficit: 11 mmol/L — ABNORMAL HIGH (ref 0.0–2.0)
Bicarbonate: 15.4 mmol/L — ABNORMAL LOW (ref 20.0–28.0)
Calcium, Ion: 0.95 mmol/L — ABNORMAL LOW (ref 1.15–1.40)
HCT: 20 % — ABNORMAL LOW (ref 36.0–46.0)
Hemoglobin: 6.8 g/dL — CL (ref 12.0–15.0)
O2 Saturation: 95 %
Potassium: 3.4 mmol/L — ABNORMAL LOW (ref 3.5–5.1)
Sodium: 131 mmol/L — ABNORMAL LOW (ref 135–145)
TCO2: 16 mmol/L — ABNORMAL LOW (ref 22–32)
pCO2, Ven: 35 mmHg — ABNORMAL LOW (ref 44–60)
pH, Ven: 7.251 (ref 7.25–7.43)
pO2, Ven: 87 mmHg — ABNORMAL HIGH (ref 32–45)

## 2023-02-28 LAB — LIPASE, BLOOD: Lipase: 30 U/L (ref 11–51)

## 2023-02-28 LAB — TYPE AND SCREEN
ABO/RH(D): A POS
Antibody Screen: NEGATIVE

## 2023-02-28 LAB — CBC WITH DIFFERENTIAL/PLATELET
Abs Immature Granulocytes: 0.08 10*3/uL — ABNORMAL HIGH (ref 0.00–0.07)
Basophils Absolute: 0 10*3/uL (ref 0.0–0.1)
Basophils Relative: 0 %
Eosinophils Absolute: 0 10*3/uL (ref 0.0–0.5)
Eosinophils Relative: 0 %
HCT: 24.1 % — ABNORMAL LOW (ref 36.0–46.0)
Hemoglobin: 8.2 g/dL — ABNORMAL LOW (ref 12.0–15.0)
Immature Granulocytes: 1 %
Lymphocytes Relative: 5 %
Lymphs Abs: 0.8 10*3/uL (ref 0.7–4.0)
MCH: 33.2 pg (ref 26.0–34.0)
MCHC: 34 g/dL (ref 30.0–36.0)
MCV: 97.6 fL (ref 80.0–100.0)
Monocytes Absolute: 1.2 10*3/uL — ABNORMAL HIGH (ref 0.1–1.0)
Monocytes Relative: 7 %
Neutro Abs: 13.7 10*3/uL — ABNORMAL HIGH (ref 1.7–7.7)
Neutrophils Relative %: 87 %
Platelets: 341 10*3/uL (ref 150–400)
RBC: 2.47 MIL/uL — ABNORMAL LOW (ref 3.87–5.11)
RDW: 12.5 % (ref 11.5–15.5)
WBC: 15.8 10*3/uL — ABNORMAL HIGH (ref 4.0–10.5)
nRBC: 0 % (ref 0.0–0.2)

## 2023-02-28 LAB — COMPREHENSIVE METABOLIC PANEL
ALT: 18 U/L (ref 0–44)
AST: 23 U/L (ref 15–41)
Albumin: 2.9 g/dL — ABNORMAL LOW (ref 3.5–5.0)
Alkaline Phosphatase: 48 U/L (ref 38–126)
Anion gap: 17 — ABNORMAL HIGH (ref 5–15)
BUN: 90 mg/dL — ABNORMAL HIGH (ref 6–20)
CO2: 17 mmol/L — ABNORMAL LOW (ref 22–32)
Calcium: 8.1 mg/dL — ABNORMAL LOW (ref 8.9–10.3)
Chloride: 93 mmol/L — ABNORMAL LOW (ref 98–111)
Creatinine, Ser: 5.16 mg/dL — ABNORMAL HIGH (ref 0.44–1.00)
GFR, Estimated: 9 mL/min — ABNORMAL LOW (ref >60)
Glucose, Bld: 108 mg/dL — ABNORMAL HIGH (ref 70–99)
Potassium: 4.1 mmol/L (ref 3.5–5.1)
Sodium: 127 mmol/L — ABNORMAL LOW (ref 135–145)
Total Bilirubin: 0.9 mg/dL (ref 0.3–1.2)
Total Protein: 5.4 g/dL — ABNORMAL LOW (ref 6.5–8.1)

## 2023-02-28 LAB — RESP PANEL BY RT-PCR (RSV, FLU A&B, COVID)  RVPGX2
Influenza A by PCR: NEGATIVE
Influenza B by PCR: NEGATIVE
Resp Syncytial Virus by PCR: NEGATIVE
SARS Coronavirus 2 by RT PCR: NEGATIVE

## 2023-02-28 LAB — PREPARE RBC (CROSSMATCH)

## 2023-02-28 LAB — CBG MONITORING, ED: Glucose-Capillary: 109 mg/dL — ABNORMAL HIGH (ref 70–99)

## 2023-02-28 LAB — URINALYSIS, W/ REFLEX TO CULTURE (INFECTION SUSPECTED)
Bilirubin Urine: NEGATIVE
Glucose, UA: NEGATIVE mg/dL
Ketones, ur: NEGATIVE mg/dL
Nitrite: NEGATIVE
Protein, ur: NEGATIVE mg/dL
Specific Gravity, Urine: 1.008 (ref 1.005–1.030)
pH: 5 (ref 5.0–8.0)

## 2023-02-28 LAB — LACTIC ACID, PLASMA
Lactic Acid, Venous: 1.1 mmol/L (ref 0.5–1.9)
Lactic Acid, Venous: 1.1 mmol/L (ref 0.5–1.9)

## 2023-02-28 LAB — BPAM RBC
Blood Product Expiration Date: 202405122359
ISSUE DATE / TIME: 202404162012
Unit Type and Rh: 6200

## 2023-02-28 LAB — BRAIN NATRIURETIC PEPTIDE: B Natriuretic Peptide: 19.3 pg/mL (ref 0.0–100.0)

## 2023-02-28 LAB — PROTIME-INR
INR: 1.1 (ref 0.8–1.2)
Prothrombin Time: 14.2 seconds (ref 11.4–15.2)

## 2023-02-28 LAB — TROPONIN I (HIGH SENSITIVITY)
Troponin I (High Sensitivity): 7 ng/L (ref <18)
Troponin I (High Sensitivity): 4 ng/L (ref <18)

## 2023-02-28 LAB — POC OCCULT BLOOD, ED: Fecal Occult Bld: POSITIVE — AB

## 2023-02-28 MED ORDER — SODIUM CHLORIDE 0.9 % IV SOLN
3.0000 g | Freq: Every day | INTRAVENOUS | Status: DC
  Administered 2023-03-01: 3 g via INTRAVENOUS
  Filled 2023-02-28: qty 8

## 2023-02-28 MED ORDER — PANTOPRAZOLE SODIUM 40 MG IV SOLR
40.0000 mg | Freq: Two times a day (BID) | INTRAVENOUS | Status: DC
  Administered 2023-02-28 – 2023-03-01 (×2): 40 mg via INTRAVENOUS
  Filled 2023-02-28 (×2): qty 10

## 2023-02-28 MED ORDER — ACETAMINOPHEN 325 MG PO TABS
650.0000 mg | ORAL_TABLET | Freq: Four times a day (QID) | ORAL | Status: DC | PRN
  Administered 2023-03-02 – 2023-03-03 (×2): 650 mg via ORAL
  Filled 2023-02-28 (×2): qty 2

## 2023-02-28 MED ORDER — SODIUM CHLORIDE 0.9 % IV SOLN
3.0000 g | Freq: Once | INTRAVENOUS | Status: AC
  Administered 2023-02-28: 3 g via INTRAVENOUS
  Filled 2023-02-28: qty 8

## 2023-02-28 MED ORDER — ONDANSETRON HCL 4 MG/2ML IJ SOLN: 4.0000 mg | Freq: Four times a day (QID) | INTRAMUSCULAR | Status: DC | PRN

## 2023-02-28 MED ORDER — HYDROMORPHONE HCL 1 MG/ML IJ SOLN
0.5000 mg | INTRAMUSCULAR | Status: DC | PRN
  Administered 2023-02-28 – 2023-03-01 (×3): 0.5 mg via INTRAVENOUS
  Filled 2023-02-28: qty 1
  Filled 2023-02-28 (×3): qty 0.5

## 2023-02-28 MED ORDER — METOCLOPRAMIDE HCL 5 MG/ML IJ SOLN
10.0000 mg | Freq: Once | INTRAMUSCULAR | Status: AC
  Administered 2023-02-28: 10 mg via INTRAVENOUS
  Filled 2023-02-28: qty 2

## 2023-02-28 MED ORDER — SODIUM CHLORIDE 0.9 % IV BOLUS
1000.0000 mL | Freq: Once | INTRAVENOUS | Status: AC
  Administered 2023-02-28: 1000 mL via INTRAVENOUS

## 2023-02-28 MED ORDER — SODIUM CHLORIDE 0.9% IV SOLUTION: Freq: Once | INTRAVENOUS | Status: AC

## 2023-02-28 MED ORDER — ONDANSETRON HCL 4 MG PO TABS: 4.0000 mg | ORAL_TABLET | Freq: Four times a day (QID) | ORAL | Status: DC | PRN

## 2023-02-28 MED ORDER — SODIUM CHLORIDE 0.9% FLUSH
3.0000 mL | Freq: Two times a day (BID) | INTRAVENOUS | Status: DC
  Administered 2023-03-01 – 2023-03-03 (×3): 3 mL via INTRAVENOUS

## 2023-02-28 MED ORDER — SODIUM CHLORIDE 0.9 % IV SOLN: INTRAVENOUS | Status: AC

## 2023-02-28 MED ORDER — ACETAMINOPHEN 650 MG RE SUPP: 650.0000 mg | Freq: Four times a day (QID) | RECTAL | Status: DC | PRN

## 2023-02-28 NOTE — Progress Notes (Deleted)
Subjective:    Patient ID: Samantha [name], female    DOB: 12/22/1965, 57 y.o.   MRN: [id number]      HPI Samantha [name] is here for No chief complaint on file.        Medications and allergies reviewed with patient and updated if appropriate.  Current Facility-Administered Medications on File Prior to Visit  Medication Dose Route Frequency Provider Last Rate Last Admin   0.9 %  sodium chloride infusion (Manually program via Guardrails IV Fluids)   Intravenous Once Gloris Manchester, MD       pantoprazole (PROTONIX) injection 40 mg  40 mg Intravenous Q12H Gloris Manchester, MD   40 mg at 02/28/23 1924   Current Outpatient Medications on File Prior to Visit  Medication Sig Dispense Refill   atorvastatin (LIPITOR) 20 MG tablet TAKE 1 TABLET EVERY DAY AT 6 PM 90 tablet 3   busPIRone (BUSPAR) 10 MG tablet TAKE 1 TABLET THREE TIMES DAILY 270 tablet 3   Calcium Carbonate-Vitamin D (CALCIUM-VITAMIN D) 500-200 MG-UNIT per tablet Take 1 tablet by mouth 2 (two) times daily with a meal.     Docusate Calcium (STOOL SOFTENER PO) Take 1 capsule by mouth 2 (two) times daily.     donepezil (ARICEPT) 10 MG tablet TAKE 1 TABLET AT BEDTIME (Patient taking differently: Take 10 mg by mouth at bedtime.) 90 tablet 3   DULoxetine (CYMBALTA) 60 MG capsule TAKE 1 CAPSULE EVERY DAY 90 capsule 3   famotidine (PEPCID) 40 MG tablet TAKE 1 TABLET EVERY DAY 90 tablet 3   Ferric Maltol (ACCRUFER) 30 MG CAPS 1 po bid 60 capsule 5   gabapentin (NEURONTIN) 600 MG tablet Take one tablet at lunch and one at bedtime for 1 week, then one at night once a day (Patient taking differently: at bedtime.) 60 tablet 3   lactulose (CHRONULAC) 10 GM/15ML solution Take 30-45 mLs (20-30 g total) by mouth 2 (two) times daily as needed for moderate constipation or severe constipation. 946 mL 5   lamoTRIgine (LAMICTAL) 100 MG tablet TAKE 1 TABLET EVERY MORNING, AND 1 AND 1/2 TABLETS AT NIGHT. 225 tablet 3   lidocaine (LIDODERM) 5 % Place 1 patch  onto the skin daily. Remove & Discard patch within 12 hours or as directed by MD 180 patch 1   linaclotide (LINZESS) 290 MCG CAPS capsule TAKE 1 CAPSULE DAILY BEFORE BREAKFAST 90 capsule 2   memantine (NAMENDA) 5 MG tablet TAKE 1 TABLET TWICE DAILY 180 tablet 3   morphine (MS CONTIN) 30 MG 12 hr tablet Take 1 tablet (30 mg total) by mouth every 12 (twelve) hours. 60 tablet 0   morphine (MS CONTIN) 30 MG 12 hr tablet Take 1 tablet (30 mg total) by mouth every 12 (twelve) hours. 60 tablet 0   morphine (MS CONTIN) 30 MG 12 hr tablet Take 1 tablet (30 mg total) by mouth every 12 (twelve) hours. 60 tablet 0   olmesartan (BENICAR) 20 MG tablet Take 1 tablet (20 mg total) by mouth daily. 90 tablet 3   Oxycodone HCl 10 MG TABS Take 1 tablet (10 mg total) by mouth 3 (three) times daily as needed. 90 tablet 0   Oxycodone HCl 10 MG TABS Take 1 tablet (10 mg total) by mouth 3 (three) times daily as needed. 90 tablet 0   pantoprazole (PROTONIX) 40 MG tablet Take 1 tablet (40 mg total) by mouth 2 (two) times daily before a meal. 180 tablet 3   Probiotic Product (  ALIGN) 4 MG CAPS Take 1 capsule (4 mg total) by mouth daily. (Patient taking differently: Take 0.5 capsules by mouth daily.) 90 capsule 1   promethazine (PHENERGAN) 12.5 MG tablet Take 1 tablet (12.5 mg total) by mouth every 6 (six) hours as needed for nausea or vomiting. 40 tablet 0    Review of Systems     Objective:  There were no vitals filed for this visit. BP Readings from Last 3 Encounters:  02/28/23 96/69  02/28/23 (!) 79/53  02/08/23 102/70   Wt Readings from Last 3 Encounters:  02/28/23 120 lb (54.4 kg)  02/08/23 147 lb (66.7 kg)  01/25/23 142 lb (64.4 kg)   There is no height or weight on file to calculate BMI.    Physical Exam         Assessment & Plan:    See Problem List for Assessment and Plan of chronic medical problems.

## 2023-02-28 NOTE — ED Triage Notes (Signed)
Pt states she has been dizzy x 5-6 days, having nausea x 2 days. She states she felt like this before and she had a UTI. Her friend is a retired [employer] fund[employer] she took her BP yesterday it was 80/40 and 74/50 today. She was on BP meds but they stopped them about a week ago. Her friend gave her zofran at 5am.

## 2023-02-28 NOTE — H&P (Signed)
History and Physical    Samantha [name] AVW:[id number] DOB: Oct 19, 1966 DOA: 02/28/2023  PCP: Tresa Garter, MD  Patient coming from: Home  I have personally briefly reviewed patient's old medical records in [facility]  Chief Complaint: Black stools, lethargy  HPI: Samantha [name] is a 57 y.o. female with medical history significant for hydrocephalus s/p VP shunt, CKD stage IIIa, HTN, HLD, facial twitching suspected due to partial complex seizure on Lamictal, iron deficiency anemia, chronic pain on morphine, depression/anxiety, OSA on CPAP who presented to the ED for evaluation of dizziness and nausea.  Patient reports 5 days of progressive lethargy.  She has been seeing black stools for about 2 weeks but initially thought this was secondary to her iron supplement.  She has had nausea but no emesis.  She has not seen any bright red blood per rectum.  She is not taking any blood thinners.  She denies chest pain or dyspnea.  She reports good urine output without dysuria.  She has had frequent cough.  ED Course  Labs/Imaging on admission: I have personally reviewed following labs and imaging studies.  Initial vitals showed BP 79/53, pulse 85, RR 20, temp 90.3 F, SpO2 80% on room air improved to 99% on 2 L O2 via [place].  Labs show sodium 127, bicarb 17, BUN 90, creatinine 5.16 (1.03 09/22/2022), serum glucose 108, LFTs within normal limits, WBC 15.8, hemoglobin 8.2 (12.9 09/22/2022), platelets 341,000, magnesium 1.8, lipase 30, BNP 19.3, lactic acid 1.1.  FOBT is positive.  Blood cultures in process.  UA ordered and pending collection.  Portable chest x-ray negative for focal consolidation, edema, effusion.  Moderate volume hiatal hernia noted.  CT head without contrast negative for acute abnormality, chronic stable noncommunicating hydrocephalus noted.  CT chest/abdomen/pelvis without contrast shows peribronchovascular groundglass airspace opacities, persistent moderate volume  hiatal hernia with fluid distention of the esophageal lumen, masslike uterine fundus.  Patient was given 2 L normal saline, IV Reglan 10 mg, IV Protonix 40 mg, IV Unasyn, and ordered to receive 1 unit PRBC transfusion.  NG tube to be placed.  EDP discussed with on-call [facility], Dr. Russella Dar, who stated their team will see in consultation.  The hospitalist service was consulted to admit for further evaluation and management.  Review of Systems: All systems reviewed and are negative except as documented in history of present illness above.   Past Medical History:  Diagnosis Date   Anxiety    Sheria Lang lesion, acute    Cerebral ventricular shunt fitting or adjustment    CKD (chronic kidney disease), stage III    COMMON MIGRAINE 10/01/2007   Chronic     Depression    Esophageal ulceration    GERD (gastroesophageal reflux disease)    GI bleed    GLUCOSE INTOLERANCE 10/01/2007   Qualifier: Diagnosis of  By: Jonny Ruiz MD, Len Blalock    Hiatal hernia    Hydrocephalus    Hyperlipidemia    HYPERLIPIDEMIA 10/01/2007   Chronic. Lovaza is too $$$ Declined statins due to worries    IDA (iron deficiency anemia)    Memory loss    Migraine    OBSTRUCTIVE SLEEP APNEA 10/01/2007   NPSG 2006:  AHI 9/hr Started cpap 2009 successfully.      Paresthesia    Prolonged QT interval    Schatzki's ring    Sleep apnea    Stroke 03/14/2013    Past Surgical History:  Procedure Laterality Date   Ames Shunt  1976,  cerebral vascular shunt/ with a revision 1981   BIOPSY  05/02/2022   Procedure: BIOPSY;  Surgeon: Imogene Burn, MD;  Location: [facility] ENDOSCOPY;  Service: Gastroenterology;;   CHOLECYSTECTOMY  1981   CYST REMOVAL NECK     ESOPHAGOGASTRODUODENOSCOPY (EGD) WITH PROPOFOL N/A 05/02/2022   Procedure: ESOPHAGOGASTRODUODENOSCOPY (EGD) WITH PROPOFOL;  Surgeon: Imogene Burn, MD;  Location: [facility] ENDOSCOPY;  Service: Gastroenterology;  Laterality: N/A;   Pituitary cyst w/subsequent shunt      Social History:   reports that she has never smoked. She has never used smokeless tobacco. She reports current alcohol use of about 1.0 standard drink of alcohol per week. She reports that she does not use drugs.  Allergies  Allergen Reactions   Alprazolam Other (See Comments)    Crying all the time   Citalopram Hydrobromide Other (See Comments)    low libido    Family History  Problem Relation Age of Onset   Dementia Mother    Kidney cancer Mother    Alzheimer's disease Mother    Migraines Mother    Colon polyps Mother    Heart disease Father    Diabetes Father    Brain cancer Maternal Aunt    Lung cancer Maternal Uncle    Esophageal cancer Maternal Grandmother    Alzheimer's disease Paternal Grandmother    Melanoma Other    Lung cancer Other    Lung cancer Other    Colon cancer Neg Hx    Rectal cancer Neg Hx    Stomach cancer Neg Hx      Prior to Admission medications   Medication Sig Start Date End Date Taking? Authorizing Provider  atorvastatin (LIPITOR) 20 MG tablet TAKE 1 TABLET EVERY DAY AT 6 PM 06/23/22   Plotnikov, Georgina Quint, MD  busPIRone (BUSPAR) 10 MG tablet TAKE 1 TABLET THREE TIMES DAILY 12/19/22   Plotnikov, Georgina Quint, MD  Calcium Carbonate-Vitamin D (CALCIUM-VITAMIN D) 500-200 MG-UNIT per tablet Take 1 tablet by mouth 2 (two) times daily with a meal.    [provider]  Docusate Calcium (STOOL SOFTENER PO) Take 1 capsule by mouth 2 (two) times daily.    [provider]  donepezil (ARICEPT) 10 MG tablet TAKE 1 TABLET AT BEDTIME Patient taking differently: Take 10 mg by mouth at bedtime. 03/31/22   Plotnikov, Georgina Quint, MD  DULoxetine (CYMBALTA) 60 MG capsule TAKE 1 CAPSULE EVERY DAY 12/19/22   Plotnikov, Georgina Quint, MD  famotidine (PEPCID) 40 MG tablet TAKE 1 TABLET EVERY DAY 12/19/22   Plotnikov, Georgina Quint, MD  Ferric Maltol (ACCRUFER) 30 MG CAPS 1 po bid 01/18/23   Plotnikov, Georgina Quint, MD  gabapentin (NEURONTIN) 600 MG tablet Take one tablet at lunch and one at  bedtime for 1 week, then one at night once a day Patient taking differently: at bedtime. 02/08/23   Plotnikov, Georgina Quint, MD  lactulose (CHRONULAC) 10 GM/15ML solution Take 30-45 mLs (20-30 g total) by mouth 2 (two) times daily as needed for moderate constipation or severe constipation. 07/18/18   Plotnikov, Georgina Quint, MD  lamoTRIgine (LAMICTAL) 100 MG tablet TAKE 1 TABLET EVERY MORNING, AND 1 AND 1/2 TABLETS AT NIGHT. 01/24/23   Plotnikov, Georgina Quint, MD  lidocaine (LIDODERM) 5 % Place 1 patch onto the skin daily. Remove & Discard patch within 12 hours or as directed by MD 01/14/19   Plotnikov, Georgina Quint, MD  linaclotide [facility]) 290 MCG CAPS capsule TAKE 1 CAPSULE DAILY BEFORE BREAKFAST 07/20/22   Plotnikov,  Georgina Quint, MD  memantine (NAMENDA) 5 MG tablet TAKE 1 TABLET TWICE DAILY 06/23/22   Plotnikov, Georgina Quint, MD  morphine (MS CONTIN) 30 MG 12 hr tablet Take 1 tablet (30 mg total) by mouth every 12 (twelve) hours. 11/24/22   Plotnikov, Georgina Quint, MD  morphine (MS CONTIN) 30 MG 12 hr tablet Take 1 tablet (30 mg total) by mouth every 12 (twelve) hours. 01/25/23   Plotnikov, Georgina Quint, MD  morphine (MS CONTIN) 30 MG 12 hr tablet Take 1 tablet (30 mg total) by mouth every 12 (twelve) hours. 01/25/23   Plotnikov, Georgina Quint, MD  olmesartan (BENICAR) 20 MG tablet Take 1 tablet (20 mg total) by mouth daily. 10/20/22 10/20/23  Plotnikov, Georgina Quint, MD  Oxycodone HCl 10 MG TABS Take 1 tablet (10 mg total) by mouth 3 (three) times daily as needed. 01/25/23   Plotnikov, Georgina Quint, MD  Oxycodone HCl 10 MG TABS Take 1 tablet (10 mg total) by mouth 3 (three) times daily as needed. 01/25/23   Plotnikov, Georgina Quint, MD  pantoprazole (PROTONIX) 40 MG tablet Take 1 tablet (40 mg total) by mouth 2 (two) times daily before a meal. 09/27/22   Plotnikov, Georgina Quint, MD  Probiotic Product (ALIGN) 4 MG CAPS Take 1 capsule (4 mg total) by mouth daily. Patient taking differently: Take 0.5 capsules by mouth daily. 01/23/20   Plotnikov, Georgina Quint, MD  promethazine (PHENERGAN) 12.5 MG tablet Take 1 tablet (12.5 mg total) by mouth every 6 (six) hours as needed for nausea or vomiting. 04/28/22   Plotnikov, Georgina Quint, MD    Physical Exam: Vitals:   02/28/23 1915 02/28/23 1947 02/28/23 2015 02/28/23 2034  BP: 96/69  (!) 86/48 (!) 79/55  Pulse: 85  83 83  Resp: 10  18 19   Temp:   99 F (37.2 C) 98.6 F (37 C)  TempSrc:   Axillary Axillary  SpO2: 98%  95% 95%  Weight:  54.4 kg    Height:  4\' 10"  (1.473 m)     Constitutional: Resting in bed, fatigued but in NAD, calm, comfortable Eyes: EOMI, lids and conjunctivae normal ENMT: NG tube in place left naris, dark black output.  Mucous membranes are dry. Posterior pharynx clear of any exudate or lesions.Normal dentition.  Neck: normal, supple, no masses. Respiratory: clear to auscultation bilaterally, no wheezing, no crackles. Normal respiratory effort. No accessory muscle use.  Cardiovascular: Regular rate and rhythm, no murmurs / rubs / gallops. No extremity edema. 2+ pedal pulses. Abdomen: no tenderness, no masses palpated. Musculoskeletal: no clubbing / cyanosis. No joint deformity upper and lower extremities. Good ROM, no contractures. Normal muscle tone.  Skin: no rashes, lesions, ulcers. No induration Neurologic: Sensation intact. Strength 5/5 in all 4.  Psychiatric: Alert and oriented x 3, appears to exhibit some short-term memory deficits. Normal mood.   EKG: Personally reviewed. Sinus rhythm, rate 76, low voltage, no acute ischemic changes previous EKG showed sinus bradycardia, rate 58.  Assessment/Plan Principal Problem:   Acute blood loss anemia Active Problems:   Acute renal failure superimposed on stage 3a chronic kidney disease   Aspiration pneumonia   Acute respiratory failure with hypoxia   Hyponatremia   Chronic pain   Adjustment disorder with mixed anxiety and depressed mood   OSA on CPAP   Presence of cerebrospinal fluid drainage device   Memory loss    Upper GI bleed   Hypotension   Samantha [name] is a 57 y.o. female with medical  history significant for hydrocephalus s/p VP shunt, CKD stage IIIa, HTN, HLD, facial twitching suspected due to partial complex seizure on Lamictal, iron deficiency anemia, chronic pain on morphine, depression/anxiety, OSA on CPAP who is admitted with symptomatic acute blood loss anemia associated with AKI on CKD stage IIIa.  Assessment and Plan: Anemia due to likely upper GI bleed with hiatal hernia and fluid distention of the esophageal lumen: Hemoglobin 8.2 (12.9 in November).  FOBT is positive.  She has melena and dark black upper GI output via NG tube. -Keep n.p.o. -Continue NG tube to low intermittent suction -Continue IV Protonix 40 mg twice daily -She is being transfuse 1 unit PRBC started while in the ED -EDP spoke with [facility], Dr. Russella Dar, they will consult  Acute renal failure superimposed on CKD stage IIIa: Creatinine 5.16, previously 1.03 five months ago.  Likely prerenal due to hypotension and anemia.  She reports good urine output.  CT without evidence of obstruction or retention. -Continue IV fluid hydration -Monitor UOP, bladder scan in and out cath as needed -Follow urine studies -Avoid nephrotoxic agents  Aspiration pneumonia: Given leukocytosis, hypoxia, and CT findings patient is being treated for suspected aspiration pneumonia versus pneumonitis. -Continue IV Unasyn renal dosed  Acute respiratory failure with hypoxia: SpO2 87% on room air suspect secondary to aspiration pneumonia/pneumonitis.  Continue treatment as above and wean supplemental O2 as able.  Hyponatremia: Sodium 127, likely volume depleted.  Continue IV fluids and trend labs.  Hypotension: Secondary to volume depletion and GI bleed.  Continue IV fluid hydration, transfusing 1 unit PRBC.  Hold antihypertensives.  Chronic pain: She is on chronic narcotics as an outpatient, morphine 30 mg q12h and oxycodone 10 mg TID  prn.  Oral meds on hold while n.p.o., will place on IV Dilaudid 0.5 mg q3h prn as needed.  Hyperlipidemia: Holding statin while NPO.  Depression/anxiety: Home meds on hold while NPO.  History of facial twitching/partial complex seizure: Resume Lamictal when no longer NPO.  Chronic hydrocephalus s/p VP shunt: CT head with stable appearance.  Memory issues: Holding Namenda and Aricept while NPO.  OSA on CPAP: Use supplemental O2 nightly while NG tube in place.   DVT prophylaxis: SCDs Start: 02/28/23 2015 Code Status: Full code, confirmed with patient on admission.  Sister present at bedside. Family Communication: Sister and sister's spouse at bedside Disposition Plan: From home, dispo pending clinical progress Consults called: [facility], Dr.Stark Severity of Illness: The appropriate patient status for this patient is INPATIENT. Inpatient status is judged to be reasonable and necessary in order to provide the required intensity of service to ensure the patient's safety. The patient's presenting symptoms, physical exam findings, and initial radiographic and laboratory data in the context of their chronic comorbidities is felt to place them at high risk for further clinical deterioration. Furthermore, it is not anticipated that the patient will be medically stable for discharge from the hospital within 2 midnights of admission.   * I certify that at the point of admission it is my clinical judgment that the patient will require inpatient hospital care spanning beyond 2 midnights from the point of admission due to high intensity of service, high risk for further deterioration and high frequency of surveillance required.Darreld Mclean MD [facility]  If 7PM-7AM, please contact night-coverage [email or web address]  02/28/2023, 8:46 PM

## 2023-02-28 NOTE — ED Notes (Addendum)
Pt using commode and reports blacked diarrhea stools.

## 2023-02-28 NOTE — ED Triage Notes (Signed)
Pt from UC for generalized weakness with dizziness that has been ongoing for weeks. Pt was hypotensive yesterday in SBP 70s, today in SBP 70s as well went to UC. Pt hypoxic in the 80s placed on 2LPM via [place] and oxygen saturation improved.   95/51 initial 79/39   NIH 0  20 G LAC

## 2023-02-28 NOTE — ED Notes (Signed)
Called [facility] for transport Optometrist at [facility].

## 2023-02-28 NOTE — ED Provider Notes (Signed)
[facility]    CSN: [id number] Arrival date & time: 02/28/23  1502      History   Chief Complaint Chief Complaint  Patient presents with   Dizziness   Nausea    HPI Samantha [name] is a 57 y.o. female.   Patient presents today companied by her best friend to help provide the majority of history.  Reports that for the past week she has been feeling poorly including lightheadedness/dizziness with associated nausea.  She has had some mild cough but denies any additional symptoms including vomiting, melena, hematochezia, urinary symptoms, shortness of breath, congestion, chest pain.  They did stop her blood pressure medication when her symptoms began interfering has been taking her blood pressure at home and noted that this was low with 1 reading at 80/40 and another at 74/50.  She does have a history of GI bleed but has not noticed any symptoms.  She had an EGD and colonoscopy 01/02/2023 that showed healing of previously noted Cameron's lesions.  She does not take any blood thinning medication.      Past Medical History:  Diagnosis Date   Anxiety    Sheria Lang lesion, acute    Cerebral ventricular shunt fitting or adjustment    CKD (chronic kidney disease), stage III    COMMON MIGRAINE 10/01/2007   Chronic     Depression    Esophageal ulceration    GERD (gastroesophageal reflux disease)    GI bleed    GLUCOSE INTOLERANCE 10/01/2007   Qualifier: Diagnosis of  By: Jonny Ruiz MD, Len Blalock    Hiatal hernia    Hydrocephalus    Hyperlipidemia    HYPERLIPIDEMIA 10/01/2007   Chronic. Lovaza is too $$$ Declined statins due to worries    IDA (iron deficiency anemia)    Memory loss    Migraine    OBSTRUCTIVE SLEEP APNEA 10/01/2007   NPSG 2006:  AHI 9/hr Started cpap 2009 successfully.      Paresthesia    Prolonged QT interval    Schatzki's ring    Sleep apnea    Stroke 03/14/2013    Patient Active Problem List   Diagnosis Date Noted   Esophageal ulceration 05/03/2022    Cameron lesion, acute 05/03/2022   Occult GI bleeding 05/02/2022   CKD (chronic kidney disease), stage III 05/02/2022   Prolonged QT interval 05/02/2022   Acute confusion 04/15/2022   UTI (urinary tract infection) 04/15/2022   Scoliosis deformity of spine 03/09/2022   Spinal cord disease 03/09/2022   HTN (hypertension) 03/09/2022   Insomnia 01/12/2022   Elevated BP without diagnosis of hypertension 09/02/2021   Intertrigo 11/11/2020   Polypharmacy    Chronic radicular cervical pain 08/01/2017   Seasonal allergic rhinitis 07/01/2017   Neck pain, acute 06/28/2017   Constipation 01/23/2015   Occasional confusion 12/18/2014   Memory loss 12/18/2014   Low back pain radiating to left lower extremity 08/29/2014   Upper back pain 07/29/2014   Anemia 07/10/2014   Well adult exam 07/07/2014   GERD (gastroesophageal reflux disease) 10/14/2013   Neck pain, chronic 08/08/2013   Cerebrovascular accident 02/12/2013   Adjustment disorder with mixed anxiety and depressed mood 09/16/2010   Nausea 09/16/2010   Fatigue 06/29/2010   Chronic pain 11/19/2008   GLUCOSE INTOLERANCE 10/01/2007   OSA on CPAP 10/01/2007   Presence of cerebrospinal fluid drainage device 10/01/2007    Past Surgical History:  Procedure Laterality Date   [place] Shunt  1976,   cerebral vascular shunt/ with  a revision 1981   BIOPSY  05/02/2022   Procedure: BIOPSY;  Surgeon: Imogene Burn, MD;  Location: [facility] ENDOSCOPY;  Service: Gastroenterology;;   CHOLECYSTECTOMY  1981   CYST REMOVAL NECK     ESOPHAGOGASTRODUODENOSCOPY (EGD) WITH PROPOFOL N/A 05/02/2022   Procedure: ESOPHAGOGASTRODUODENOSCOPY (EGD) WITH PROPOFOL;  Surgeon: Imogene Burn, MD;  Location: [facility] ENDOSCOPY;  Service: Gastroenterology;  Laterality: N/A;   Pituitary cyst w/subsequent shunt      OB History   No obstetric history on file.      Home Medications    Prior to Admission medications   Medication Sig Start Date End Date Taking? Authorizing  Provider  atorvastatin (LIPITOR) 20 MG tablet TAKE 1 TABLET EVERY DAY AT 6 PM 06/23/22  Yes Plotnikov, Georgina Quint, MD  busPIRone (BUSPAR) 10 MG tablet TAKE 1 TABLET THREE TIMES DAILY 12/19/22  Yes Plotnikov, Georgina Quint, MD  Calcium Carbonate-Vitamin D (CALCIUM-VITAMIN D) 500-200 MG-UNIT per tablet Take 1 tablet by mouth 2 (two) times daily with a meal.   Yes [provider]  donepezil (ARICEPT) 10 MG tablet TAKE 1 TABLET AT BEDTIME Patient taking differently: Take 10 mg by mouth at bedtime. 03/31/22  Yes Plotnikov, Georgina Quint, MD  DULoxetine (CYMBALTA) 60 MG capsule TAKE 1 CAPSULE EVERY DAY 12/19/22  Yes Plotnikov, Georgina Quint, MD  famotidine (PEPCID) 40 MG tablet TAKE 1 TABLET EVERY DAY 12/19/22  Yes Plotnikov, Georgina Quint, MD  Ferric Maltol (ACCRUFER) 30 MG CAPS 1 po bid 01/18/23  Yes Plotnikov, Georgina Quint, MD  gabapentin (NEURONTIN) 600 MG tablet Take one tablet at lunch and one at bedtime for 1 week, then one at night once a day Patient taking differently: at bedtime. 02/08/23  Yes Plotnikov, Georgina Quint, MD  lactulose (CHRONULAC) 10 GM/15ML solution Take 30-45 mLs (20-30 g total) by mouth 2 (two) times daily as needed for moderate constipation or severe constipation. 07/18/18  Yes Plotnikov, Georgina Quint, MD  lamoTRIgine (LAMICTAL) 100 MG tablet TAKE 1 TABLET EVERY MORNING, AND 1 AND 1/2 TABLETS AT NIGHT. 01/24/23  Yes Plotnikov, Georgina Quint, MD  lidocaine (LIDODERM) 5 % Place 1 patch onto the skin daily. Remove & Discard patch within 12 hours or as directed by MD 01/14/19  Yes Plotnikov, Georgina Quint, MD  linaclotide (LINZESS) 290 MCG CAPS capsule TAKE 1 CAPSULE DAILY BEFORE BREAKFAST 07/20/22  Yes Plotnikov, Georgina Quint, MD  memantine (NAMENDA) 5 MG tablet TAKE 1 TABLET TWICE DAILY 06/23/22  Yes Plotnikov, Georgina Quint, MD  morphine (MS CONTIN) 30 MG 12 hr tablet Take 1 tablet (30 mg total) by mouth every 12 (twelve) hours. 01/25/23  Yes Plotnikov, Georgina Quint, MD  olmesartan (BENICAR) 20 MG tablet Take 1 tablet (20 mg total)  by mouth daily. 10/20/22 10/20/23 Yes Plotnikov, Georgina Quint, MD  Oxycodone HCl 10 MG TABS Take 1 tablet (10 mg total) by mouth 3 (three) times daily as needed. 01/25/23  Yes Plotnikov, Georgina Quint, MD  Oxycodone HCl 10 MG TABS Take 1 tablet (10 mg total) by mouth 3 (three) times daily as needed. 01/25/23  Yes Plotnikov, Georgina Quint, MD  pantoprazole (PROTONIX) 40 MG tablet Take 1 tablet (40 mg total) by mouth 2 (two) times daily before a meal. 09/27/22  Yes Plotnikov, Georgina Quint, MD  Probiotic Product (ALIGN) 4 MG CAPS Take 1 capsule (4 mg total) by mouth daily. Patient taking differently: Take 0.5 capsules by mouth daily. 01/23/20  Yes Plotnikov, Georgina Quint, MD  Docusate Calcium (STOOL SOFTENER PO) Take 1  capsule by mouth 2 (two) times daily.    [provider]  morphine (MS CONTIN) 30 MG 12 hr tablet Take 1 tablet (30 mg total) by mouth every 12 (twelve) hours. 11/24/22   Plotnikov, Georgina Quint, MD  morphine (MS CONTIN) 30 MG 12 hr tablet Take 1 tablet (30 mg total) by mouth every 12 (twelve) hours. 01/25/23   Plotnikov, Georgina Quint, MD  promethazine (PHENERGAN) 12.5 MG tablet Take 1 tablet (12.5 mg total) by mouth every 6 (six) hours as needed for nausea or vomiting. 04/28/22   Plotnikov, Georgina Quint, MD    Family History Family History  Problem Relation Age of Onset   Dementia Mother    Kidney cancer Mother    Alzheimer's disease Mother    Migraines Mother    Colon polyps Mother    Heart disease Father    Diabetes Father    Brain cancer Maternal Aunt    Lung cancer Maternal Uncle    Esophageal cancer Maternal Grandmother    Alzheimer's disease Paternal Grandmother    Melanoma Other    Lung cancer Other    Lung cancer Other    Colon cancer Neg Hx    Rectal cancer Neg Hx    Stomach cancer Neg Hx     Social History Social History   Tobacco Use   Smoking status: Never   Smokeless tobacco: Never  Vaping Use   Vaping Use: Never used  Substance Use Topics   Alcohol use: Yes     Alcohol/week: 1.0 standard drink of alcohol    Types: 1 Glasses of wine per week    Comment: Social   Drug use: No     Allergies   Alprazolam and Citalopram hydrobromide   Review of Systems Review of Systems  Constitutional:  Positive for activity change and fatigue. Negative for appetite change and fever.  HENT:  Negative for congestion.   Respiratory:  Positive for cough. Negative for shortness of breath.   Cardiovascular:  Negative for chest pain.  Gastrointestinal:  Positive for nausea. Negative for abdominal pain, blood in stool, diarrhea and vomiting.  Neurological:  Negative for dizziness, light-headedness and headaches.     Physical Exam Triage Vital Signs ED Triage Vitals  Enc Vitals Group     BP 02/28/23 1552 (!) 79/53     Pulse Rate 02/28/23 1552 86     Resp 02/28/23 1552 20     Temp 02/28/23 1552 98.3 F (36.8 C)     Temp Source 02/28/23 1552 Oral     SpO2 02/28/23 1552 (!) 87 %     Weight --      Height --      Head Circumference --      Peak Flow --      Pain Score 02/28/23 1546 0     Pain Loc --      Pain Edu? --      Excl. in GC? --    No data found.  Updated Vital Signs BP (!) 79/53 (BP Location: Right Arm)   Pulse 85   Temp 98.3 F (36.8 C) (Oral)   Resp 20   LMP 11/24/2016   SpO2 99%   Visual Acuity Right Eye Distance:   Left Eye Distance:   Bilateral Distance:    Right Eye Near:   Left Eye Near:    Bilateral Near:     Physical Exam Vitals reviewed.  Constitutional:      General: She is awake. She  is not in acute distress.    Appearance: Normal appearance. She is well-developed. She is ill-appearing.     Interventions: Nasal cannula in place.     Comments: Very pleasant female sitting in wheelchair in no acute distress  HENT:     Head: Normocephalic and atraumatic.     Mouth/Throat:     Pharynx: Uvula midline. No oropharyngeal exudate or posterior oropharyngeal erythema.  Eyes:     Comments: Pale conjunctiva   Cardiovascular:     Rate and Rhythm: Normal rate and regular rhythm.     Heart sounds: Normal heart sounds, S1 normal and S2 normal. No murmur heard. Pulmonary:     Effort: Pulmonary effort is normal.     Breath sounds: Examination of the right-lower field reveals decreased breath sounds. Examination of the left-lower field reveals decreased breath sounds. Decreased breath sounds present. No wheezing, rhonchi or rales.  Abdominal:     Palpations: Abdomen is soft.     Tenderness: There is no abdominal tenderness.  Musculoskeletal:     Cervical back: Normal range of motion and neck supple.  Psychiatric:        Behavior: Behavior is cooperative.      UC Treatments / Results  Labs (all labs ordered are listed, but only abnormal results are displayed) Labs Reviewed - No data to display  EKG   Radiology No results found.  Procedures Procedures (including critical care time)  Medications Ordered in UC Medications - No data to display  Initial Impression / Assessment and Plan / UC Course  I have reviewed the triage vital signs and the nursing notes.  Pertinent labs & imaging results that were available during my care of the patient were reviewed by me and considered in my medical decision making (see chart for details).     Patient is hypotensive on intake and was initially hypoxic.  Her oxygen saturation improved after supplemental oxygen by nasal cannula.  Discussed that she would need to be evaluated in the emergency room.  Given her new oxygen requirement and hypotension she needs to be transported by EMS.  [facility] was called for transport.  She was stable to time of discharge and taken to [facility], ER for further evaluation and management.  Final Clinical Impressions(s) / UC Diagnoses   Final diagnoses:  Hypotension, unspecified hypotension type  Acute respiratory failure with hypoxia  Lethargy   Discharge Instructions   None    ED Prescriptions   None     PDMP not reviewed this encounter.   Jeani Hawking, PA-C 02/28/23 1616

## 2023-02-28 NOTE — ED Notes (Signed)
Patient is being discharged from the Urgent Care and sent to the Emergency Department via [facility] . Per Dorann Ou, PA-C, patient is in need of higher level of care due to hypotension, hypoxia. Patient is aware and verbalizes understanding of plan of care.  Vitals:   02/28/23 1552 02/28/23 1558  BP: (!) 79/53   Pulse: 86 85  Resp: 20   Temp: 98.3 F (36.8 C)   SpO2: (!) 87% 99%

## 2023-02-28 NOTE — ED Provider Notes (Signed)
[place] EMERGENCY DEPARTMENT AT [facility] Provider Note   CSN: [id number] Arrival date & time: 02/28/23  1642     History  Chief Complaint  Patient presents with   Hypotension    Samantha [name] is a 57 y.o. female.  HPI Patient presents from urgent care for hypotension and hypoxia.  Medical history includes anxiety, CKD, migraine headaches, GERD, GI bleed, hydrocephalus s/p ventricular shunt, HLD, anemia, CVA, OSA.  Over the past week, patient has been experiencing fatigue, nausea, poor appetite, and poor p.o. intake.  With standing, she will experience lightheadedness, dizziness, and worsened nausea.  She has not had any vomiting.  She has had a mild cough.  She has had low blood pressure readings at home despite discontinuing her blood pressure medications.  At urgent care, blood pressure was 79/53.  SpO2 was 87% on room air.  She was sent to the ED for further evaluation.  Transport team reports normal SpO2 on room air.  They did initiate IV fluids.  Currently, patient endorses ongoing nausea.  She states that she was given Zofran by a friend this morning and that helped.  She denies any current areas of pain.  She has had recent constipation, which she has been treating with Dulcolax.  Last bowel movement was 2 days ago.  Home Medications Prior to Admission medications   Medication Sig Start Date End Date Taking? Authorizing Provider  atorvastatin (LIPITOR) 20 MG tablet TAKE 1 TABLET EVERY DAY AT 6 PM 06/23/22   Plotnikov, Georgina Quint, MD  busPIRone (BUSPAR) 10 MG tablet TAKE 1 TABLET THREE TIMES DAILY 12/19/22   Plotnikov, Georgina Quint, MD  Calcium Carbonate-Vitamin D (CALCIUM-VITAMIN D) 500-200 MG-UNIT per tablet Take 1 tablet by mouth 2 (two) times daily with a meal.    [provider]  Docusate Calcium (STOOL SOFTENER PO) Take 1 capsule by mouth 2 (two) times daily.    [provider]  donepezil (ARICEPT) 10 MG tablet TAKE 1 TABLET AT BEDTIME Patient  taking differently: Take 10 mg by mouth at bedtime. 03/31/22   Plotnikov, Georgina Quint, MD  DULoxetine (CYMBALTA) 60 MG capsule TAKE 1 CAPSULE EVERY DAY 12/19/22   Plotnikov, Georgina Quint, MD  famotidine (PEPCID) 40 MG tablet TAKE 1 TABLET EVERY DAY 12/19/22   Plotnikov, Georgina Quint, MD  Ferric Maltol (ACCRUFER) 30 MG CAPS 1 po bid 01/18/23   Plotnikov, Georgina Quint, MD  gabapentin (NEURONTIN) 600 MG tablet Take one tablet at lunch and one at bedtime for 1 week, then one at night once a day Patient taking differently: at bedtime. 02/08/23   Plotnikov, Georgina Quint, MD  lactulose (CHRONULAC) 10 GM/15ML solution Take 30-45 mLs (20-30 g total) by mouth 2 (two) times daily as needed for moderate constipation or severe constipation. 07/18/18   Plotnikov, Georgina Quint, MD  lamoTRIgine (LAMICTAL) 100 MG tablet TAKE 1 TABLET EVERY MORNING, AND 1 AND 1/2 TABLETS AT NIGHT. 01/24/23   Plotnikov, Georgina Quint, MD  lidocaine (LIDODERM) 5 % Place 1 patch onto the skin daily. Remove & Discard patch within 12 hours or as directed by MD 01/14/19   Plotnikov, Georgina Quint, MD  linaclotide (LINZESS) 290 MCG CAPS capsule TAKE 1 CAPSULE DAILY BEFORE BREAKFAST 07/20/22   Plotnikov, Georgina Quint, MD  memantine (NAMENDA) 5 MG tablet TAKE 1 TABLET TWICE DAILY 06/23/22   Plotnikov, Georgina Quint, MD  morphine (MS CONTIN) 30 MG 12 hr tablet Take 1 tablet (30 mg total) by mouth every 12 (twelve) hours.  11/24/22   Plotnikov, Georgina Quint, MD  morphine (MS CONTIN) 30 MG 12 hr tablet Take 1 tablet (30 mg total) by mouth every 12 (twelve) hours. 01/25/23   Plotnikov, Georgina Quint, MD  morphine (MS CONTIN) 30 MG 12 hr tablet Take 1 tablet (30 mg total) by mouth every 12 (twelve) hours. 01/25/23   Plotnikov, Georgina Quint, MD  olmesartan (BENICAR) 20 MG tablet Take 1 tablet (20 mg total) by mouth daily. 10/20/22 10/20/23  Plotnikov, Georgina Quint, MD  Oxycodone HCl 10 MG TABS Take 1 tablet (10 mg total) by mouth 3 (three) times daily as needed. 01/25/23   Plotnikov, Georgina Quint, MD  Oxycodone HCl 10  MG TABS Take 1 tablet (10 mg total) by mouth 3 (three) times daily as needed. 01/25/23   Plotnikov, Georgina Quint, MD  pantoprazole (PROTONIX) 40 MG tablet Take 1 tablet (40 mg total) by mouth 2 (two) times daily before a meal. 09/27/22   Plotnikov, Georgina Quint, MD  Probiotic Product (ALIGN) 4 MG CAPS Take 1 capsule (4 mg total) by mouth daily. Patient taking differently: Take 0.5 capsules by mouth daily. 01/23/20   Plotnikov, Georgina Quint, MD  promethazine (PHENERGAN) 12.5 MG tablet Take 1 tablet (12.5 mg total) by mouth every 6 (six) hours as needed for nausea or vomiting. 04/28/22   Plotnikov, Georgina Quint, MD      Allergies    Alprazolam and Citalopram hydrobromide    Review of Systems   Review of Systems  Constitutional:  Positive for activity change, appetite change and fatigue.  Gastrointestinal:  Positive for constipation and nausea.  Neurological:  Positive for dizziness and light-headedness.  All other systems reviewed and are negative.   Physical Exam Updated Vital Signs BP 103/74   Pulse 79   Temp 98.7 F (37.1 C)   Resp 16   LMP 11/24/2016   SpO2 100%  Physical Exam Vitals and nursing note reviewed.  Constitutional:      General: She is not in acute distress.    Appearance: Normal appearance. She is well-developed. She is not ill-appearing, toxic-appearing or diaphoretic.  HENT:     Head: Normocephalic and atraumatic.     Right Ear: External ear normal.     Left Ear: External ear normal.     Nose: Nose normal.     Mouth/Throat:     Mouth: Mucous membranes are moist.  Eyes:     Extraocular Movements: Extraocular movements intact.     Conjunctiva/sclera: Conjunctivae normal.  Cardiovascular:     Rate and Rhythm: Normal rate and regular rhythm.     Heart sounds: Murmur heard.  Pulmonary:     Effort: Pulmonary effort is normal. No respiratory distress.     Breath sounds: Normal breath sounds. No wheezing, rhonchi or rales.  Abdominal:     General: There is no distension.      Palpations: Abdomen is soft.     Tenderness: There is no abdominal tenderness.  Musculoskeletal:        General: No swelling. Normal range of motion.     Cervical back: Normal range of motion and neck supple.     Right lower leg: No edema.     Left lower leg: No edema.  Skin:    General: Skin is warm and dry.  Neurological:     General: No focal deficit present.     Mental Status: She is alert and oriented to person, place, and time.     Cranial Nerves: No cranial  nerve deficit.     Sensory: No sensory deficit.     Motor: No weakness.     Coordination: Coordination normal.  Psychiatric:        Mood and Affect: Mood normal.        Behavior: Behavior normal.        Thought Content: Thought content normal.        Judgment: Judgment normal.     ED Results / Procedures / Treatments   Labs (all labs ordered are listed, but only abnormal results are displayed) Labs Reviewed  COMPREHENSIVE METABOLIC PANEL - Abnormal; Notable for the following components:      Result Value   Sodium 127 (*)    Chloride 93 (*)    CO2 17 (*)    Glucose, Bld 108 (*)    BUN 90 (*)    Creatinine, Ser 5.16 (*)    Calcium 8.1 (*)    Total Protein 5.4 (*)    Albumin 2.9 (*)    GFR, Estimated 9 (*)    Anion gap 17 (*)    All other components within normal limits  CBC WITH DIFFERENTIAL/PLATELET - Abnormal; Notable for the following components:   WBC 15.8 (*)    RBC 2.47 (*)    Hemoglobin 8.2 (*)    HCT 24.1 (*)    Neutro Abs 13.7 (*)    Monocytes Absolute 1.2 (*)    Abs Immature Granulocytes 0.08 (*)    All other components within normal limits  CBG MONITORING, ED - Abnormal; Notable for the following components:   Glucose-Capillary 109 (*)    All other components within normal limits  I-STAT CHEM 8, ED - Abnormal; Notable for the following components:   Sodium 131 (*)    Potassium 3.4 (*)    BUN 92 (*)    Creatinine, Ser 4.90 (*)    Calcium, Ion 0.95 (*)    TCO2 18 (*)    Hemoglobin 6.5  (*)    HCT 19.0 (*)    All other components within normal limits  I-STAT VENOUS BLOOD GAS, ED - Abnormal; Notable for the following components:   pCO2, Ven 35.0 (*)    pO2, Ven 87 (*)    Bicarbonate 15.4 (*)    TCO2 16 (*)    Acid-base deficit 11.0 (*)    Sodium 131 (*)    Potassium 3.4 (*)    Calcium, Ion 0.95 (*)    HCT 20.0 (*)    Hemoglobin 6.8 (*)    All other components within normal limits  POC OCCULT BLOOD, ED - Abnormal; Notable for the following components:   Fecal Occult Bld POSITIVE (*)    All other components within normal limits  RESP PANEL BY RT-PCR (RSV, FLU A&B, COVID)  RVPGX2  CULTURE, BLOOD (ROUTINE X 2)  CULTURE, BLOOD (ROUTINE X 2)  LACTIC ACID, PLASMA  PROTIME-INR  BRAIN NATRIURETIC PEPTIDE  MAGNESIUM  LIPASE, BLOOD  LACTIC ACID, PLASMA  URINALYSIS, W/ REFLEX TO CULTURE (INFECTION SUSPECTED)  TYPE AND SCREEN  PREPARE RBC (CROSSMATCH)  TROPONIN I (HIGH SENSITIVITY)  TROPONIN I (HIGH SENSITIVITY)    EKG EKG Interpretation  Date/Time:  Tuesday February 28 2023 17:18:48 EDT Ventricular Rate:  76 PR Interval:  153 QRS Duration: 95 QT Interval:  392 QTC Calculation: 441 R Axis:   23 Text Interpretation: Sinus rhythm Low voltage, precordial leads Consider anterior infarct Confirmed by Gloris Manchester ([id number]) on 02/28/2023 6:20:00 PM  Radiology CT Head Wo Contrast  Result Date: 02/28/2023 CLINICAL DATA:  Syncope/presyncope, cerebrovascular cause suspected EXAM: CT HEAD WITHOUT CONTRAST TECHNIQUE: Contiguous axial images were obtained from the base of the skull through the vertex without intravenous contrast. RADIATION DOSE REDUCTION: This exam was performed according to the departmental dose-optimization program which includes automated exposure control, adjustment of the mA and/or kV according to patient size and/or use of iterative reconstruction technique. COMPARISON:  CT head 05/01/2022 FINDINGS: Brain: Post right posterior approach ventricular peritoneal  shunt. Similar-appearing right frontal lobe encephalomalacia. No evidence of large-territorial acute infarction. No parenchymal hemorrhage. No mass lesion. No extra-axial collection. No mass effect or midline shift. Persistent stable enlarged lateral and third ventricles. Basilar cisterns are patent. Vascular: No hyperdense vessel. Vascular clipping again noted along the circle-of-Willis on the left. Skull: No acute fracture or focal lesion.  Right frontal craniotomy. Sinuses/Orbits: Paranasal sinuses and mastoid air cells are clear. The orbits are unremarkable. Other: None. IMPRESSION: No acute intracranial abnormality in a patient with chronic stable non-communicating hydrocephalus. Electronically Signed   By: Tish Frederickson M.D.   On: 02/28/2023 18:21   CT CHEST ABDOMEN PELVIS WO CONTRAST  Result Date: 02/28/2023 CLINICAL DATA:  Pt states she has been dizzy x 5-6 days, having nausea x 2 days EXAM: CT CHEST, ABDOMEN AND PELVIS WITHOUT CONTRAST TECHNIQUE: Multidetector CT imaging of the chest, abdomen and pelvis was performed following the standard protocol without IV contrast. RADIATION DOSE REDUCTION: This exam was performed according to the departmental dose-optimization program which includes automated exposure control, adjustment of the mA and/or kV according to patient size and/or use of iterative reconstruction technique. COMPARISON:  None Available. FINDINGS: CT CHEST FINDINGS Cardiovascular: Normal heart size. No significant pericardial effusion. The thoracic aorta is normal in caliber. No atherosclerotic plaque of the thoracic aorta. No coronary artery calcifications. Mediastinum/Nodes: No enlarged mediastinal, hilar, or axillary lymph nodes. Thyroid gland, trachea, and esophagus demonstrate no significant findings. Persistent moderate volume hiatal hernia. The esophageal lumen is diffusely distended with fluid. Lungs/Pleura: Peribronchovascular ground-glass airspace opacities. No focal  consolidation. No pulmonary nodule. No pulmonary mass. No pleural effusion. No pneumothorax. Musculoskeletal: No chest wall abnormality. No suspicious lytic or blastic osseous lesions. No acute displaced fracture. CT ABDOMEN PELVIS FINDINGS Hepatobiliary: No focal liver abnormality. Status post cholecystectomy. No biliary dilatation. Pancreas: No focal lesion. Normal pancreatic contour. No surrounding inflammatory changes. No main pancreatic ductal dilatation. Spleen: Normal in size without focal abnormality. Adrenals/Urinary Tract: No adrenal nodule bilaterally. No nephrolithiasis and no hydronephrosis. No definite contour-deforming renal mass. No ureterolithiasis or hydroureter. The urinary bladder is unremarkable. Stomach/Bowel: Stomach is distended with ingested material with no definite finding of obstruction. No evidence of bowel wall thickening or dilatation. Appendix appears normal. Vascular/Lymphatic: No abdominal aorta or iliac aneurysm. Mild atherosclerotic plaque of the aorta and its branches. No abdominal, pelvic, or inguinal lymphadenopathy. Reproductive: Masslike uterine fundus suggestive of fibroid (7:94). Otherwise uterus and bilateral adnexa are unremarkable. Other: No intraperitoneal free fluid. No intraperitoneal free gas. No organized fluid collection. Musculoskeletal: No abdominal wall hernia or abnormality. No suspicious lytic or blastic osseous lesions. No acute displaced fracture. IMPRESSION: 1. Peribronchovascular ground-glass airspace opacities. Finding may represent pulmonary edema versus infection/inflammation (aspiration pneumonia). 2. Persistent moderate volume hiatal hernia with fluid distension of the esophageal lumen. Consider enteric tube placement. 3. Masslike uterine fundus suggestive of fibroid. 4. Otherwise limited evaluation on this noncontrast CT chest, abdomen, pelvis. Electronically Signed   By: Tish Frederickson M.D.   On: 02/28/2023 18:16   DG Chest  Port 1 View  Result  Date: 02/28/2023 CLINICAL DATA:  Questionable sepsis - evaluate for abnormality EXAM: PORTABLE CHEST 1 VIEW COMPARISON:  Chest x-ray 05/01/2022 FINDINGS: Right neck and chest ventriculoperitoneal shunt catheter with similar irregular contour and likely calcification. Possible persistent catheter discontinuity of the level of the mid right chest. The heart and mediastinal contours are within normal limits. At least moderate volume hiatal hernia overlying the mediastinum. No focal consolidation. No pulmonary edema. No pleural effusion. No pneumothorax. No acute osseous abnormality. IMPRESSION: 1. No active disease. 2. At least moderate volume hiatal hernia. Electronically Signed   By: Tish Frederickson M.D.   On: 02/28/2023 17:53    Procedures Procedures    Medications Ordered in ED Medications  Ampicillin-Sulbactam (UNASYN) 3 g in sodium chloride 0.9 % 100 mL IVPB (3 g Intravenous New Bag/Given 02/28/23 1837)  0.9 %  sodium chloride infusion (Manually program via Guardrails IV Fluids) (has no administration in time range)  pantoprazole (PROTONIX) injection 40 mg (has no administration in time range)  sodium chloride 0.9 % bolus 1,000 mL (0 mLs Intravenous Stopped 02/28/23 1800)  sodium chloride 0.9 % bolus 1,000 mL (1,000 mLs Intravenous New Bag/Given 02/28/23 1809)  metoCLOPramide (REGLAN) injection 10 mg (10 mg Intravenous Given 02/28/23 1832)    ED Course/ Medical Decision Making/ A&P                             Medical Decision Making Amount and/or Complexity of Data Reviewed Labs: ordered. Radiology: ordered. ECG/medicine tests: ordered.  Risk Prescription drug management.   This patient presents to the ED for concern of generalized weakness and near syncope, this involves an extensive number of treatment options, and is a complaint that carries with it a high risk of complications and morbidity.  The differential diagnosis includes dehydration, infection, metabolic derangements, anemia,  polypharmacy   Co morbidities that complicate the patient evaluation  anxiety, CKD, migraine headaches, GERD, GI bleed, hydrocephalus s/p ventricular shunt, HLD, anemia, CVA, OSA   Additional history obtained:  Additional history obtained from patient's sister and friend, EMS External records from outside source obtained and reviewed including EMR   Lab Tests:  I Ordered, and personally interpreted labs.  The pertinent results include: Acute anemia is present; AKI is present.  Elevated BUN suggest prerenal etiology and/or UGIB, leukocytosis is present.   Imaging Studies ordered:  I ordered imaging studies including chest x-ray, CT head, chest, abdomen, pelvis I independently visualized and interpreted imaging which showed peribronchovascular ground opacities suggestive of aspiration pneumonia; enteric contents are present; there is fluid distention of esophageal lumen I agree with the radiologist interpretation   Cardiac Monitoring: / EKG:  The patient was maintained on a cardiac monitor.  I personally viewed and interpreted the cardiac monitored which showed an underlying rhythm of: Sinus rhythm   Consultations Obtained:  I requested consultation with the gastroenterologist, Dr. Russella Dar,  and discussed lab and imaging findings as well as pertinent plan - they recommend: Keep n.p.o., GI will see in consult   Problem List / ED Course / Critical interventions / Medication management  Patient presents from urgent care office for hypotension and concern of hypoxia.  She initially presented there due to 4 to 5 days of nausea, poor p.o. intake and near syncopal symptoms with standing.  Although she is prescribed blood pressure medications, she has not taken these over the past 2 days.  Blood pressure remained low at  urgent care with SBP in the 70s.  On arrival, blood pressure has improved to 90s SBP.  Her SpO2 is normal on room air.  She denies any shortness of breath.  Lungs are clear  to auscultation.  She denies any current areas of pain.  Abdomen is soft and without tenderness.  She does endorse ongoing nausea.  Zofran was ordered.  Patient was kept on IV fluid bolus.  Diagnostic workup was initiated.  Patient had multiple findings on lab work.  AKI and acute anemia are present.  While in the ED, patient had a melanotic bowel movement.  Presence of blood was confirmed with Hemoccult testing.  Will question further about this, she does state that she has had ongoing dark stools for quite some time.  Given her near syncopal symptoms, PVCs were ordered for symptomatic anemia.  Patient was given additional IV fluids for AKI and ongoing hypotension.  Blood pressures did respond to this.  On imaging studies, patient has enteric contents despite not eating today.  She also has a fluid-filled lumen of her esophagus.  It is likely that this has resulted in some aspiration pneumonia which explains her recent cough.  Unasyn was ordered for treatment of aspiration pneumonia.  Patient was agreeable to NG tube which was ordered.  GI was consulted.  They recommended NG tube, keeping n.p.o., PPI.  They will see in consult.  Patient was admitted for further management. I ordered medication including IV fluids for hypotension and AKI; Reglan for nausea; Unasyn for aspiration pneumonia; PRBCs for symptomatic anemia Reevaluation of the patient after these medicines showed that the patient improved I have reviewed the patients home medicines and have made adjustments as needed   Social Determinants of Health:  Has PCP  CRITICAL CARE Performed by: Gloris Manchester   Total critical care time: 35 minutes  Critical care time was exclusive of separately billable procedures and treating other patients.  Critical care was necessary to treat or prevent imminent or life-threatening deterioration.  Critical care was time spent personally by me on the following activities: development of treatment plan with  patient and/or surrogate as well as nursing, discussions with consultants, evaluation of patient's response to treatment, examination of patient, obtaining history from patient or surrogate, ordering and performing treatments and interventions, ordering and review of laboratory studies, ordering and review of radiographic studies, pulse oximetry and re-evaluation of patient's condition.        Final Clinical Impression(s) / ED Diagnoses Final diagnoses:  AKI (acute kidney injury)  UGIB (upper gastrointestinal bleed)  Symptomatic anemia  Aspiration pneumonia of both lungs, unspecified aspiration pneumonia type, unspecified part of lung    Rx / DC Orders ED Discharge Orders     None         Gloris Manchester, MD 02/28/23 1857

## 2023-02-28 NOTE — ED Notes (Addendum)
Pt placed on 2 liter of O2

## 2023-02-28 NOTE — Hospital Course (Signed)
Samantha [name] is a 57 y.o. female with medical history significant for hydrocephalus s/p VP shunt, CKD stage IIIa, HTN, HLD, facial twitching suspected due to partial complex seizure on Lamictal, iron deficiency anemia, chronic pain on morphine, depression/anxiety, OSA on CPAP who is admitted with symptomatic acute blood loss anemia associated with AKI on CKD stage IIIa.

## 2023-03-01 ENCOUNTER — Ambulatory Visit: Payer: Medicare HMO | Admitting: Internal Medicine

## 2023-03-01 ENCOUNTER — Encounter (HOSPITAL_COMMUNITY): Payer: Self-pay | Admitting: Internal Medicine

## 2023-03-01 ENCOUNTER — Encounter (HOSPITAL_COMMUNITY)
Admission: EM | Disposition: A | Discharge status: Home / Self Care | Payer: Self-pay | Source: Ambulatory Visit | Attending: Family Medicine

## 2023-03-01 ENCOUNTER — Inpatient Hospital Stay (HOSPITAL_COMMUNITY): Payer: Medicare HMO | Admitting: Certified Registered"

## 2023-03-01 DIAGNOSIS — I10 Essential (primary) hypertension: Secondary | ICD-10-CM | POA: Diagnosis not present

## 2023-03-01 DIAGNOSIS — K259 Gastric ulcer, unspecified as acute or chronic, without hemorrhage or perforation: Secondary | ICD-10-CM | POA: Diagnosis not present

## 2023-03-01 DIAGNOSIS — K922 Gastrointestinal hemorrhage, unspecified: Secondary | ICD-10-CM | POA: Diagnosis not present

## 2023-03-01 DIAGNOSIS — K920 Hematemesis: Secondary | ICD-10-CM | POA: Diagnosis not present

## 2023-03-01 DIAGNOSIS — D62 Acute posthemorrhagic anemia: Secondary | ICD-10-CM | POA: Diagnosis not present

## 2023-03-01 DIAGNOSIS — K254 Chronic or unspecified gastric ulcer with hemorrhage: Secondary | ICD-10-CM | POA: Diagnosis not present

## 2023-03-01 DIAGNOSIS — K921 Melena: Secondary | ICD-10-CM

## 2023-03-01 DIAGNOSIS — K449 Diaphragmatic hernia without obstruction or gangrene: Secondary | ICD-10-CM | POA: Diagnosis not present

## 2023-03-01 DIAGNOSIS — K222 Esophageal obstruction: Secondary | ICD-10-CM | POA: Diagnosis not present

## 2023-03-01 DIAGNOSIS — I959 Hypotension, unspecified: Secondary | ICD-10-CM

## 2023-03-01 DIAGNOSIS — N179 Acute kidney failure, unspecified: Secondary | ICD-10-CM | POA: Diagnosis not present

## 2023-03-01 DIAGNOSIS — G4730 Sleep apnea, unspecified: Secondary | ICD-10-CM | POA: Diagnosis not present

## 2023-03-01 DIAGNOSIS — K529 Noninfective gastroenteritis and colitis, unspecified: Secondary | ICD-10-CM

## 2023-03-01 HISTORY — PX: HEMOSTASIS CLIP PLACEMENT: SHX6857

## 2023-03-01 HISTORY — PX: ESOPHAGOGASTRODUODENOSCOPY (EGD) WITH PROPOFOL: SHX5813

## 2023-03-01 HISTORY — PX: HEMOSTASIS CONTROL: SHX6838

## 2023-03-01 LAB — CULTURE, BLOOD (ROUTINE X 2)

## 2023-03-01 LAB — CBC WITH DIFFERENTIAL/PLATELET
Abs Immature Granulocytes: 0.08 10*3/uL — ABNORMAL HIGH (ref 0.00–0.07)
Basophils Absolute: 0 10*3/uL (ref 0.0–0.1)
Basophils Relative: 0 %
Eosinophils Absolute: 0.2 10*3/uL (ref 0.0–0.5)
Eosinophils Relative: 2 %
HCT: 30.5 % — ABNORMAL LOW (ref 36.0–46.0)
Hemoglobin: 10.5 g/dL — ABNORMAL LOW (ref 12.0–15.0)
Immature Granulocytes: 1 %
Lymphocytes Relative: 10 %
Lymphs Abs: 1.1 10*3/uL (ref 0.7–4.0)
MCH: 32.4 pg (ref 26.0–34.0)
MCHC: 34.4 g/dL (ref 30.0–36.0)
MCV: 94.1 fL (ref 80.0–100.0)
Monocytes Absolute: 0.7 10*3/uL (ref 0.1–1.0)
Monocytes Relative: 6 %
Neutro Abs: 9.1 10*3/uL — ABNORMAL HIGH (ref 1.7–7.7)
Neutrophils Relative %: 81 %
Platelets: 258 10*3/uL (ref 150–400)
RBC: 3.24 MIL/uL — ABNORMAL LOW (ref 3.87–5.11)
RDW: 13.8 % (ref 11.5–15.5)
WBC: 11.2 10*3/uL — ABNORMAL HIGH (ref 4.0–10.5)
nRBC: 0 % (ref 0.0–0.2)

## 2023-03-01 LAB — CBC
HCT: 28.9 % — ABNORMAL LOW (ref 36.0–46.0)
Hemoglobin: 10 g/dL — ABNORMAL LOW (ref 12.0–15.0)
MCH: 32.6 pg (ref 26.0–34.0)
MCHC: 34.6 g/dL (ref 30.0–36.0)
MCV: 94.1 fL (ref 80.0–100.0)
Platelets: 272 10*3/uL (ref 150–400)
RBC: 3.07 MIL/uL — ABNORMAL LOW (ref 3.87–5.11)
RDW: 13.4 % (ref 11.5–15.5)
WBC: 12.8 10*3/uL — ABNORMAL HIGH (ref 4.0–10.5)
nRBC: 0 % (ref 0.0–0.2)

## 2023-03-01 LAB — TYPE AND SCREEN: Unit division: 0

## 2023-03-01 LAB — BASIC METABOLIC PANEL
Anion gap: 9 (ref 5–15)
BUN: 83 mg/dL — ABNORMAL HIGH (ref 6–20)
CO2: 20 mmol/L — ABNORMAL LOW (ref 22–32)
Calcium: 8 mg/dL — ABNORMAL LOW (ref 8.9–10.3)
Chloride: 105 mmol/L (ref 98–111)
Creatinine, Ser: 2.98 mg/dL — ABNORMAL HIGH (ref 0.44–1.00)
GFR, Estimated: 18 mL/min — ABNORMAL LOW (ref >60)
Glucose, Bld: 97 mg/dL (ref 70–99)
Potassium: 3.9 mmol/L (ref 3.5–5.1)
Sodium: 134 mmol/L — ABNORMAL LOW (ref 135–145)

## 2023-03-01 LAB — MRSA NEXT GEN BY PCR, NASAL: MRSA by PCR Next Gen: NOT DETECTED

## 2023-03-01 LAB — BPAM RBC

## 2023-03-01 SURGERY — ESOPHAGOGASTRODUODENOSCOPY (EGD) WITH PROPOFOL
Anesthesia: Monitor Anesthesia Care

## 2023-03-01 MED ORDER — ATORVASTATIN CALCIUM 10 MG PO TABS
20.0000 mg | ORAL_TABLET | Freq: Every day | ORAL | Status: DC
  Administered 2023-03-01 – 2023-03-02 (×2): 20 mg via ORAL
  Filled 2023-03-01 (×2): qty 2

## 2023-03-01 MED ORDER — LAMOTRIGINE 100 MG PO TABS
150.0000 mg | ORAL_TABLET | Freq: Every day | ORAL | Status: DC
  Administered 2023-03-01 – 2023-03-02 (×2): 150 mg via ORAL
  Filled 2023-03-01 (×2): qty 2

## 2023-03-01 MED ORDER — PANTOPRAZOLE SODIUM 40 MG IV SOLR: 40.0000 mg | INTRAVENOUS | Status: DC

## 2023-03-01 MED ORDER — BUSPIRONE HCL 10 MG PO TABS
10.0000 mg | ORAL_TABLET | Freq: Three times a day (TID) | ORAL | Status: DC
  Administered 2023-03-01 – 2023-03-03 (×6): 10 mg via ORAL
  Filled 2023-03-01 (×6): qty 1

## 2023-03-01 MED ORDER — PANTOPRAZOLE SODIUM 40 MG IV SOLR: 40.0000 mg | Freq: Two times a day (BID) | INTRAVENOUS | Status: DC

## 2023-03-01 MED ORDER — SODIUM CHLORIDE 0.9 % IV SOLN: INTRAVENOUS | Status: DC

## 2023-03-01 MED ORDER — MORPHINE SULFATE ER 30 MG PO TBCR
30.0000 mg | EXTENDED_RELEASE_TABLET | Freq: Two times a day (BID) | ORAL | Status: DC
  Administered 2023-03-01 – 2023-03-03 (×4): 30 mg via ORAL
  Filled 2023-03-01 (×4): qty 1

## 2023-03-01 MED ORDER — PANTOPRAZOLE INFUSION (NEW) - SIMPLE MED
8.0000 mg/h | INTRAVENOUS | Status: DC
  Administered 2023-03-01 – 2023-03-02 (×4): 8 mg/h via INTRAVENOUS
  Filled 2023-03-01 (×5): qty 100

## 2023-03-01 MED ORDER — DONEPEZIL HCL 10 MG PO TABS
10.0000 mg | ORAL_TABLET | Freq: Every day | ORAL | Status: DC
  Administered 2023-03-01 – 2023-03-02 (×2): 10 mg via ORAL
  Filled 2023-03-01 (×2): qty 1

## 2023-03-01 MED ORDER — AMLODIPINE BESYLATE 2.5 MG PO TABS: 2.5000 mg | ORAL_TABLET | Freq: Every day | ORAL | 3 refills | Status: DC

## 2023-03-01 MED ORDER — LIDOCAINE 2% (20 MG/ML) 5 ML SYRINGE
INTRAMUSCULAR | Status: DC | PRN
  Administered 2023-03-01: 25 mg via INTRAVENOUS

## 2023-03-01 MED ORDER — GABAPENTIN 300 MG PO CAPS
600.0000 mg | ORAL_CAPSULE | Freq: Every day | ORAL | Status: DC
  Administered 2023-03-01 – 2023-03-02 (×2): 600 mg via ORAL
  Filled 2023-03-01 (×2): qty 2

## 2023-03-01 MED ORDER — LAMOTRIGINE 100 MG PO TABS
100.0000 mg | ORAL_TABLET | Freq: Every morning | ORAL | Status: DC
  Administered 2023-03-02 – 2023-03-03 (×2): 100 mg via ORAL
  Filled 2023-03-01: qty 4
  Filled 2023-03-01 (×2): qty 1

## 2023-03-01 MED ORDER — MEMANTINE HCL 10 MG PO TABS
5.0000 mg | ORAL_TABLET | Freq: Two times a day (BID) | ORAL | Status: DC
  Administered 2023-03-01 – 2023-03-03 (×4): 5 mg via ORAL
  Filled 2023-03-01 (×4): qty 0.5

## 2023-03-01 MED ORDER — SODIUM CHLORIDE 0.9 % IV SOLN
3.0000 g | Freq: Two times a day (BID) | INTRAVENOUS | Status: DC
  Administered 2023-03-01 – 2023-03-02 (×2): 3 g via INTRAVENOUS
  Filled 2023-03-01 (×2): qty 8

## 2023-03-01 MED ORDER — DULOXETINE HCL 30 MG PO CPEP
60.0000 mg | ORAL_CAPSULE | Freq: Every day | ORAL | Status: DC
  Administered 2023-03-01 – 2023-03-03 (×3): 60 mg via ORAL
  Filled 2023-03-01 (×3): qty 2

## 2023-03-01 MED ORDER — PROPOFOL 500 MG/50ML IV EMUL
INTRAVENOUS | Status: DC | PRN
  Administered 2023-03-01: 150 ug/kg/min via INTRAVENOUS
  Administered 2023-03-01: 30 mg via INTRAVENOUS
  Administered 2023-03-01: 20 mg via INTRAVENOUS

## 2023-03-01 SURGICAL SUPPLY — 15 items

## 2023-03-01 NOTE — Anesthesia Preprocedure Evaluation (Signed)
Anesthesia Evaluation  Patient identified by MRN, date of birth, ID band Patient awake    Reviewed: Allergy & Precautions, H&P , NPO status , Patient's Chart, lab work & pertinent test results  Airway Mallampati: II   Neck ROM: full    Dental   Pulmonary sleep apnea    breath sounds clear to auscultation       Cardiovascular hypertension,  Rhythm:regular Rate:Normal     Neuro/Psych  Headaches PSYCHIATRIC DISORDERS Anxiety Depression    CVA    GI/Hepatic hiatal hernia, PUD,GERD  ,,GI bleed   Endo/Other    Renal/GU      Musculoskeletal   Abdominal   Peds  Hematology   Anesthesia Other Findings   Reproductive/Obstetrics                             Anesthesia Physical Anesthesia Plan  ASA: 3  Anesthesia Plan: MAC   Post-op Pain Management:    Induction: Intravenous  PONV Risk Score and Plan: 2 and Propofol infusion and Treatment may vary due to age or medical condition  Airway Management Planned: Nasal Cannula  Additional Equipment:   Intra-op Plan:   Post-operative Plan:   Informed Consent: I have reviewed the patients History and Physical, chart, labs and discussed the procedure including the risks, benefits and alternatives for the proposed anesthesia with the patient or authorized representative who has indicated his/her understanding and acceptance.     Dental advisory given  Plan Discussed with: CRNA, Anesthesiologist and Surgeon  Anesthesia Plan Comments:        Anesthesia Quick Evaluation

## 2023-03-01 NOTE — Transfer of Care (Addendum)
Immediate Anesthesia Transfer of Care Note  Patient: Samantha [name]  Procedure(s) Performed: ESOPHAGOGASTRODUODENOSCOPY (EGD) WITH PROPOFOL HEMOSTASIS CLIP PLACEMENT HEMOSTASIS CONTROL  Patient Location: Endoscopy Unit  Anesthesia Type:MAC  Level of Consciousness: drowsy  Airway & Oxygen Therapy: Patient Spontanous Breathing  Post-op Assessment: Report given to RN  Post vital signs: Reviewed and stable  Last Vitals:  Vitals Value Taken Time  BP 123/68   Temp    Pulse 89   Resp 15   SpO2 99     Last Pain:  Vitals:   03/01/23 1304  TempSrc: Oral  PainSc: 0-No pain      Patients Stated Pain Goal: 0 (03/01/23 0052)  Complications: No notable events documented.

## 2023-03-01 NOTE — Telephone Encounter (Signed)
Notified pt spoke w/ sister [name]). She states pt been at the Er since yesterday. She bleeding from somewhere and her kidneys are in failure. Told sister will let MD know and when she have her hosp f/u will f/u on BP med,../lmb

## 2023-03-01 NOTE — ED Notes (Signed)
Patient ambulated to the bathroom with no issues. Hooked back up upon return to room.

## 2023-03-01 NOTE — Op Note (Signed)
[facility] Patient Name: Samantha [name] Procedure Date : 03/01/2023 MRN: [id number] Attending MD: Beverley Fiedler , MD, [id number] Date of Birth: 17-Aug-1966 CSN: [id number] Age: 57 Admit Type: Inpatient Procedure:                Upper GI endoscopy Indications:              Hematemesis, Active gastrointestinal bleeding,                            history of IDA and previous Cameron's lesions Providers:                Carie Caddy. Rhea Belton, MD, Fransisca Connors, Rozetta Nunnery, Technician Referring MD:             [facility] Medicines:                Monitored Anesthesia Care Complications:            No immediate complications. Estimated Blood Loss:     Estimated blood loss: none. Procedure:                Pre-Anesthesia Assessment:                           - Prior to the procedure, a History and Physical                            was performed, and patient medications and                            allergies were reviewed. The patient's tolerance of                            previous anesthesia was also reviewed. The risks                            and benefits of the procedure and the sedation                            options and risks were discussed with the patient.                            All questions were answered, and informed consent                            was obtained. Prior Anticoagulants: The patient has                            taken no anticoagulant or antiplatelet agents. ASA                            Grade Assessment: III - A patient with severe  systemic disease. After reviewing the risks and                            benefits, the patient was deemed in satisfactory                            condition to undergo the procedure.                           After obtaining informed consent, the endoscope was                            passed under direct vision. Throughout the                             procedure, the patient's blood pressure, pulse, and                            oxygen saturations were monitored continuously. The                            [id number]-H190 ([id number]) Olympus endoscope was introduced                            through the mouth, and advanced to the third part                            of duodenum. The upper GI endoscopy was                            accomplished without difficulty. The patient                            tolerated the procedure well. Scope In: Scope Out: Findings:      Normal mucosa was found in the entire esophagus.      One benign-appearing, intrinsic moderate (circumferential scarring or       stenosis; an endoscope may pass) stenosis was found at the       gastroesophageal junction. This stenosis measured 1.4 cm (inner       diameter). The stenosis was traversed.      A 4 cm hiatal hernia with multiple Cameron ulcers was found. The linear       ulcers had adherent clot which was lavaged and cleared. The largest       linear ulcer contained flat red spots with oozing. Treatment difficult       as location is at diaphragmatic hiatus which was moving. For hemostasis,       three hemostatic clips were successfully placed (MR conditional). Clip       manufacturer: [employer]. There was no bleeding at the end of the       maneuver. For post maneuver hemostasis and to treat the smaller       ulcerations not clipped, hemostatic gel (PuraStat) was deployed. There       was no bleeding at the end of the maneuver.      A medium amount of  food (residue) was found in the gastric body which       prevented complete visualization but not other notable findings seen.      The examined duodenum was normal. Impression:               - Normal mucosa was found in the entire esophagus.                           - Benign-appearing esophageal stenosis. Improved                            compared to Feb 2024 at last dilation.                            - 4 cm hiatal hernia with multiple Cameron ulcers.                            Clips (MR conditional) were placed. Clip                            manufacturer: [employer]. Hemostatic gel                            applied.                           - A medium amount of food (residue) in the stomach.                           - Normal examined duodenum.                           - No specimens collected. Moderate Sedation:      N/A Recommendation:           - Return patient to hospital ward for ongoing care.                           - Clear liquid diet.                           - Continue present medications including                            pantoprazole infusion.                           - Monitor Hgb closely and transfuse additional                            pRBCs as needed. Goal Hgb > 7.0                           - Needs urgent outpatient surgical consult for                            hiatal hernia repair given recurrent UGI bleeding  secondary to Cameron's ulcers. Procedure Code(s):        --- Professional ---                           [id number], Esophagogastroduodenoscopy, flexible,                            transoral; with control of bleeding, any method Diagnosis Code(s):        --- Professional ---                           K22.2, Esophageal obstruction                           K44.9, Diaphragmatic hernia without obstruction or                            gangrene                           K25.9, Gastric ulcer, unspecified as acute or                            chronic, without hemorrhage or perforation                           K92.0, Hematemesis                           K92.2, Gastrointestinal hemorrhage, unspecified CPT copyright 2022 [employer]. All rights reserved. The codes documented in this report are preliminary and upon coder review may  be revised to meet current compliance  requirements. Beverley Fiedler, MD 03/01/2023 2:19:14 PM This report has been signed electronically. Number of Addenda: 0

## 2023-03-01 NOTE — Telephone Encounter (Signed)
Amlodipine prescription was sent to [facility]. Take olmesartan half a tablet until you get amlodipine. Start amlodipine when you receive it and stop olmesartan. Thanks

## 2023-03-01 NOTE — ED Notes (Signed)
Pt ambulated to RR with RN assistance.

## 2023-03-01 NOTE — Consult Note (Addendum)
Consultation Note   Referring Provider:  [facility] PCP: Tresa Garter, MD Primary Gastroenterologist: Erick Blinks, MD Reason for consultation: Gi bleed with melena  Hospital Day: 2   Assessment   57 yo female history of chronic iron deficiency anemia, GERD / hiatal hernia and Cameron's lesions. Admitted with upper GI bleed / hypotension. Bleeding probably secondary to known Cameron's lesions. Also consider PUD BP stable at 126/56  Acute on chronic anemia.  -presenting hgb 8.2 ( down from 12.9 baseline).  -hgb improved to 10 with just one unit of blood    AKi .  Cr 5.1. Cr improved to 2.98 with IVF  Possible aspiration PNA. On Unasyn  Chronic constipation Manages with Linzess  OSA on CPAP  Hydrocephalus s/p VP shunt    Plan   -Continue BID IV PPI -Keep NPO -Will need EGD , will try for today.   The risks and benefits of EGD with possible biopsies were discussed with the patient who agrees to proceed.  -Monitor hgb, transfuse for hg< 7.    History of Present Illness   Patient is a 57 y.o. year old female with a past medical history of hiatal hernia, Cameron's lesions, chronic iron deficiency anemia, , anxiety, depression, hypertension, hyperlipidemia, CVA, sleep apnea uses CPAP, pituitary cyst removal and hydrocephalus with VP shunt placed age 43, chronic pain syndrome on narcotics, seizures, memory loss, IDA, stage 3b, GERD and chronic constipation. S/P cholecystectomy 1981   see [facility] for any additional medical problems.  Samantha [name] has chronic iron deficiency anemia.  She has had repeated endoscopic evaluations.  IDA felt likely to be secondary to Cameron's lesions.  ED WORKUP:   Patient presented to the ED 02/28/2023 with dizziness, hypotension and and nausea. Nausea started several days ago. No associated abdominal pain. GERD symptoms controlled. She took a friend's anti-emetic which helped but then supply ran  out.   She has black stools on BID iron but stools have become darker over the last several weeks. No NSAID use. She takes daily Pepcid and BID pantoprazole. In ED  her head CT scan without acute findings. BUN is 90, Cr 5., WBC 14.9, Na 131, k+ 3.4,Ca+ 8, ionized Ca+ low at 0.95, INR 1.1. . WBC 15.8, Hgb 8.2 ( baseline 12.9).   Non-contrast CT chest / abd / pelvis 1. Peribronchovascular ground-glass airspace opacities. Finding may represent pulmonary edema versus infection/inflammation (aspiration pneumonia). 2. Persistent moderate volume hiatal hernia with fluid distension of the esophageal lumen. Consider enteric tube placement. 3. Masslike uterine fundus suggestive of fibroid. 4. Otherwise limited evaluation on this noncontrast CT chest, abdomen, pelvis  Patient  had some confusion last night, sister in room and helps with history    Previous GI History / Evaluation :   Not all inclusive list:   Small bowel capsule endoscopy 04/2015 -Incomplete study. No visualization beyond 2 hours but no source of bleeding  EGD June 2023 for evaluation of melena -Esophageal ulcers. - Benign-appearing esophageal stenosis. - Hiatal hernia. - Non-bleeding gastric ulcers (Cameron lesions) with pigmented material. - Erythematous mucosa in the antrum. Biopsied. - Normal examined duodenum. Biopsied.  EGD Feb 2024  Indication:  iron deficiency anemia, follow-up reflux and Cameron's erosions and dysphagia -Normal mucosa was  found in the entire esophagus. - Benign-appearing esophageal stenosis. Dilated to 18 mm with balloon. - 3 cm hiatal hernia. - Normal stomach. - Normal examined duodenum. Biopsied.  Colonoscopy Feb 2024  for iron deficiency anemia  -Normal exam    Imaging and Labs    Recent Labs    02/28/23 1705 02/28/23 1723 03/01/23 0228  WBC 15.8*  --  12.8*  HGB 8.2* 6.5*  6.8* 10.0*  HCT 24.1* 19.0*  20.0* 28.9*  PLT 341  --  272   Recent Labs    02/28/23 1705 02/28/23 1723  03/01/23 0228  NA 127* 131*  131* 134*  K 4.1 3.4*  3.4* 3.9  CL 93* 102 105  CO2 17*  --  20*  GLUCOSE 108* 87 97  BUN 90* 92* 83*  CREATININE 5.16* 4.90* 2.98*  CALCIUM 8.1*  --  8.0*   Recent Labs    02/28/23 1705  PROT 5.4*  ALBUMIN 2.9*  AST 23  ALT 18  ALKPHOS 48  BILITOT 0.9   No results for input(s): "HEPBSAG", "HCVAB", "HEPAIGM", "HEPBIGM" in the last 72 hours. Recent Labs    02/28/23 1705  LABPROT 14.2  INR 1.1    Past Medical History:  Diagnosis Date   Anxiety    Sheria Lang lesion, acute    Cerebral ventricular shunt fitting or adjustment    CKD (chronic kidney disease), stage III    COMMON MIGRAINE 10/01/2007   Chronic     Depression    Esophageal ulceration    GERD (gastroesophageal reflux disease)    GI bleed    GLUCOSE INTOLERANCE 10/01/2007   Qualifier: Diagnosis of  By: Jonny Ruiz MD, Len Blalock    Hiatal hernia    Hydrocephalus    Hyperlipidemia    HYPERLIPIDEMIA 10/01/2007   Chronic. Lovaza is too $$$ Declined statins due to worries    IDA (iron deficiency anemia)    Memory loss    Migraine    OBSTRUCTIVE SLEEP APNEA 10/01/2007   NPSG 2006:  AHI 9/hr Started cpap 2009 successfully.      Paresthesia    Prolonged QT interval    Schatzki's ring    Sleep apnea    Stroke 03/14/2013    Past Surgical History:  Procedure Laterality Date   [place] Shunt  1976,   cerebral vascular shunt/ with a revision 1981   BIOPSY  05/02/2022   Procedure: BIOPSY;  Surgeon: Imogene Burn, MD;  Location: [facility] ENDOSCOPY;  Service: Gastroenterology;;   CHOLECYSTECTOMY  1981   CYST REMOVAL NECK     ESOPHAGOGASTRODUODENOSCOPY (EGD) WITH PROPOFOL N/A 05/02/2022   Procedure: ESOPHAGOGASTRODUODENOSCOPY (EGD) WITH PROPOFOL;  Surgeon: Imogene Burn, MD;  Location[facility] ENDOSCOPY;  Service: Gastroenterology;  Laterality: N/A;   Pituitary cyst w/subsequent shunt      Family History  Problem Relation Age of Onset   Dementia Mother    Kidney cancer Mother    Alzheimer's  disease Mother    Migraines Mother    Colon polyps Mother    Heart disease Father    Diabetes Father    Brain cancer Maternal Aunt    Lung cancer Maternal Uncle    Esophageal cancer Maternal Grandmother    Alzheimer's disease Paternal Grandmother    Melanoma Other    Lung cancer Other    Lung cancer Other    Colon cancer Neg Hx    Rectal cancer Neg Hx    Stomach cancer Neg Hx     Prior to  Admission medications   Medication Sig Start Date End Date Taking? Authorizing Provider  atorvastatin (LIPITOR) 20 MG tablet TAKE 1 TABLET EVERY DAY AT 6 PM 06/23/22  Yes Plotnikov, Georgina Quint, MD  busPIRone (BUSPAR) 10 MG tablet TAKE 1 TABLET THREE TIMES DAILY 12/19/22  Yes Plotnikov, Georgina Quint, MD  Calcium Carbonate-Vitamin D (CALCIUM-VITAMIN D) 500-200 MG-UNIT per tablet Take 1 tablet by mouth 2 (two) times daily with a meal.   Yes [provider]  Docusate Calcium (STOOL SOFTENER PO) Take 1 capsule by mouth 2 (two) times daily.   Yes [provider]  donepezil (ARICEPT) 10 MG tablet TAKE 1 TABLET AT BEDTIME Patient taking differently: Take 10 mg by mouth at bedtime. 03/31/22  Yes Plotnikov, Georgina Quint, MD  DULoxetine (CYMBALTA) 60 MG capsule TAKE 1 CAPSULE EVERY DAY 12/19/22  Yes Plotnikov, Georgina Quint, MD  famotidine (PEPCID) 40 MG tablet TAKE 1 TABLET EVERY DAY 12/19/22  Yes Plotnikov, Georgina Quint, MD  Ferric Maltol (ACCRUFER) 30 MG CAPS 1 po bid 01/18/23  Yes Plotnikov, Georgina Quint, MD  gabapentin (NEURONTIN) 600 MG tablet Take one tablet at lunch and one at bedtime for 1 week, then one at night once a day Patient taking differently: at bedtime. 02/08/23  Yes Plotnikov, Georgina Quint, MD  amLODipine (NORVASC) 2.5 MG tablet Take 1 tablet (2.5 mg total) by mouth daily. 03/01/23   Plotnikov, Georgina Quint, MD  lactulose (CHRONULAC) 10 GM/15ML solution Take 30-45 mLs (20-30 g total) by mouth 2 (two) times daily as needed for moderate constipation or severe constipation. 07/18/18   Plotnikov, Georgina Quint, MD   lamoTRIgine (LAMICTAL) 100 MG tablet TAKE 1 TABLET EVERY MORNING, AND 1 AND 1/2 TABLETS AT NIGHT. 01/24/23   Plotnikov, Georgina Quint, MD  lidocaine (LIDODERM) 5 % Place 1 patch onto the skin daily. Remove & Discard patch within 12 hours or as directed by MD 01/14/19   Plotnikov, Georgina Quint, MD  linaclotide (LINZESS) 290 MCG CAPS capsule TAKE 1 CAPSULE DAILY BEFORE BREAKFAST 07/20/22   Plotnikov, Georgina Quint, MD  memantine (NAMENDA) 5 MG tablet TAKE 1 TABLET TWICE DAILY 06/23/22   Plotnikov, Georgina Quint, MD  morphine (MS CONTIN) 30 MG 12 hr tablet Take 1 tablet (30 mg total) by mouth every 12 (twelve) hours. 11/24/22   Plotnikov, Georgina Quint, MD  morphine (MS CONTIN) 30 MG 12 hr tablet Take 1 tablet (30 mg total) by mouth every 12 (twelve) hours. 01/25/23   Plotnikov, Georgina Quint, MD  morphine (MS CONTIN) 30 MG 12 hr tablet Take 1 tablet (30 mg total) by mouth every 12 (twelve) hours. 01/25/23   Plotnikov, Georgina Quint, MD  Oxycodone HCl 10 MG TABS Take 1 tablet (10 mg total) by mouth 3 (three) times daily as needed. 01/25/23   Plotnikov, Georgina Quint, MD  Oxycodone HCl 10 MG TABS Take 1 tablet (10 mg total) by mouth 3 (three) times daily as needed. 01/25/23   Plotnikov, Georgina Quint, MD  pantoprazole (PROTONIX) 40 MG tablet Take 1 tablet (40 mg total) by mouth 2 (two) times daily before a meal. 09/27/22   Plotnikov, Georgina Quint, MD  Probiotic Product (ALIGN) 4 MG CAPS Take 1 capsule (4 mg total) by mouth daily. Patient taking differently: Take 0.5 capsules by mouth daily. 01/23/20   Plotnikov, Georgina Quint, MD  promethazine (PHENERGAN) 12.5 MG tablet Take 1 tablet (12.5 mg total) by mouth every 6 (six) hours as needed for nausea or vomiting. 04/28/22   Plotnikov, Georgina Quint, MD  Current Facility-Administered Medications  Medication Dose Route Frequency Provider Last Rate Last Admin   0.9 %  sodium chloride infusion   Intravenous Continuous Pahwani, Daleen Bo, MD       acetaminophen (TYLENOL) tablet 650 mg  650 mg Oral Q6H PRN Charlsie Quest, MD       Or   acetaminophen (TYLENOL) suppository 650 mg  650 mg Rectal Q6H PRN Charlsie Quest, MD       Ampicillin-Sulbactam (UNASYN) 3 g in sodium chloride 0.9 % 100 mL IVPB  3 g Intravenous Q0600 Charlsie Quest, MD   Stopped at 03/01/23 0718   atorvastatin (LIPITOR) tablet 20 mg  20 mg Oral Daily Pahwani, Daleen Bo, MD       busPIRone (BUSPAR) tablet 10 mg  10 mg Oral TID Hughie Closs, MD       donepezil (ARICEPT) tablet 10 mg  10 mg Oral QHS Pahwani, Daleen Bo, MD       DULoxetine (CYMBALTA) DR capsule 60 mg  60 mg Oral Daily Pahwani, Daleen Bo, MD       gabapentin (NEURONTIN) tablet 600 mg  600 mg Oral QHS Hughie Closs, MD       HYDROmorphone (DILAUDID) injection 0.5 mg  0.5 mg Intravenous Q3H PRN Darreld Mclean R, MD   0.5 mg at 02/28/23 2314   lamoTRIgine (LAMICTAL) tablet 100 mg  100 mg Oral BID Hughie Closs, MD       memantine (NAMENDA) tablet 5 mg  5 mg Oral BID Hughie Closs, MD       morphine (MS CONTIN) 12 hr tablet 30 mg  30 mg Oral Q12H Pahwani, Daleen Bo, MD       ondansetron (ZOFRAN) tablet 4 mg  4 mg Oral Q6H PRN Charlsie Quest, MD       Or   ondansetron (ZOFRAN) injection 4 mg  4 mg Intravenous Q6H PRN Charlsie Quest, MD       pantoprazole (PROTONIX) injection 40 mg  40 mg Intravenous Q12H Gloris Manchester, MD   40 mg at 02/28/23 1924   sodium chloride flush (NS) 0.9 % injection 3 mL  3 mL Intravenous Q12H Charlsie Quest, MD       Current Outpatient Medications  Medication Sig Dispense Refill   atorvastatin (LIPITOR) 20 MG tablet TAKE 1 TABLET EVERY DAY AT 6 PM 90 tablet 3   busPIRone (BUSPAR) 10 MG tablet TAKE 1 TABLET THREE TIMES DAILY 270 tablet 3   Calcium Carbonate-Vitamin D (CALCIUM-VITAMIN D) 500-200 MG-UNIT per tablet Take 1 tablet by mouth 2 (two) times daily with a meal.     Docusate Calcium (STOOL SOFTENER PO) Take 1 capsule by mouth 2 (two) times daily.     donepezil (ARICEPT) 10 MG tablet TAKE 1 TABLET AT BEDTIME (Patient taking differently: Take 10 mg by mouth at  bedtime.) 90 tablet 3   DULoxetine (CYMBALTA) 60 MG capsule TAKE 1 CAPSULE EVERY DAY 90 capsule 3   famotidine (PEPCID) 40 MG tablet TAKE 1 TABLET EVERY DAY 90 tablet 3   Ferric Maltol (ACCRUFER) 30 MG CAPS 1 po bid 60 capsule 5   gabapentin (NEURONTIN) 600 MG tablet Take one tablet at lunch and one at bedtime for 1 week, then one at night once a day (Patient taking differently: at bedtime.) 60 tablet 3   amLODipine (NORVASC) 2.5 MG tablet Take 1 tablet (2.5 mg total) by mouth daily. 90 tablet 3   lactulose (CHRONULAC) 10 GM/15ML solution Take 30-45 mLs (20-30 g  total) by mouth 2 (two) times daily as needed for moderate constipation or severe constipation. 946 mL 5   lamoTRIgine (LAMICTAL) 100 MG tablet TAKE 1 TABLET EVERY MORNING, AND 1 AND 1/2 TABLETS AT NIGHT. 225 tablet 3   lidocaine (LIDODERM) 5 % Place 1 patch onto the skin daily. Remove & Discard patch within 12 hours or as directed by MD 180 patch 1   linaclotide (LINZESS) 290 MCG CAPS capsule TAKE 1 CAPSULE DAILY BEFORE BREAKFAST 90 capsule 2   memantine (NAMENDA) 5 MG tablet TAKE 1 TABLET TWICE DAILY 180 tablet 3   morphine (MS CONTIN) 30 MG 12 hr tablet Take 1 tablet (30 mg total) by mouth every 12 (twelve) hours. 60 tablet 0   morphine (MS CONTIN) 30 MG 12 hr tablet Take 1 tablet (30 mg total) by mouth every 12 (twelve) hours. 60 tablet 0   morphine (MS CONTIN) 30 MG 12 hr tablet Take 1 tablet (30 mg total) by mouth every 12 (twelve) hours. 60 tablet 0   Oxycodone HCl 10 MG TABS Take 1 tablet (10 mg total) by mouth 3 (three) times daily as needed. 90 tablet 0   Oxycodone HCl 10 MG TABS Take 1 tablet (10 mg total) by mouth 3 (three) times daily as needed. 90 tablet 0   pantoprazole (PROTONIX) 40 MG tablet Take 1 tablet (40 mg total) by mouth 2 (two) times daily before a meal. 180 tablet 3   Probiotic Product (ALIGN) 4 MG CAPS Take 1 capsule (4 mg total) by mouth daily. (Patient taking differently: Take 0.5 capsules by mouth daily.) 90  capsule 1   promethazine (PHENERGAN) 12.5 MG tablet Take 1 tablet (12.5 mg total) by mouth every 6 (six) hours as needed for nausea or vomiting. 40 tablet 0    Allergies as of 02/28/2023 - Review Complete 02/28/2023  Allergen Reaction Noted   Alprazolam Other (See Comments) 09/20/2019   Citalopram hydrobromide Other (See Comments) 06/29/2010    Social History   Socioeconomic History   Marital status: Married    Spouse name: Merchant navy officer   Number of children: 0   Years of education: 12   Highest education level: Not on file  Occupational History   Occupation: [employer]    Comment: Clinical biochemist Rep   Occupation: disabled  Tobacco Use   Smoking status: Never   Smokeless tobacco: Never  Vaping Use   Vaping Use: Never used  Substance and Sexual Activity   Alcohol use: Yes    Alcohol/week: 1.0 standard drink of alcohol    Types: 1 Glasses of wine per week    Comment: Social   Drug use: No   Sexual activity: Yes  Other Topics Concern   Not on file  Social History Narrative   Mother is in a NH.    Patient lives at home with her husband [name]). 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    High school education.   Right handed.   Caffeine 1 cup daily.   She has no children   Social Determinants of Health   Financial Resource Strain: Low Risk  (09/05/2022)   Overall Financial Resource Strain (CARDIA)    Difficulty of Paying Living Expenses: Not hard at all  Food Insecurity: No Food Insecurity (02/28/2023)   Hunger Vital Sign    Worried About Running Out of Food in the Last Year: Never true    Ran Out of Food in the Last Year: Never true  Transportation Needs: No Transportation  Needs (02/28/2023)   PRAPARE - Administrator, Civil Service (Medical): No    Lack of Transportation (Non-Medical): No  Physical Activity: Inactive (09/05/2022)   Exercise Vital Sign    Days of Exercise per Week: 0 days    Minutes of Exercise per Session: 0 min  Stress: No Stress  Concern Present (09/05/2022)   [employer] of Occupational Health - Occupational Stress Questionnaire    Feeling of Stress : Not at all  Social Connections: Moderately Isolated (09/05/2022)   Social Connection and Isolation Panel [NHANES]    Frequency of Communication with Friends and Family: More than three times a week    Frequency of Social Gatherings with Friends and Family: More than three times a week    Attends Religious Services: Never    Database administrator or Organizations: No    Attends Banker Meetings: Never    Marital Status: Married  Catering manager Violence: Not At Risk (02/28/2023)   Humiliation, Afraid, Rape, and Kick questionnaire    Fear of Current or Ex-Partner: No    Emotionally Abused: No    Physically Abused: No    Sexually Abused: No    Review of Systems: All systems reviewed and negative except where noted in HPI.  Physical Exam: Vital signs in last 24 hours: Temp:  [97.9 F (36.6 C)-99 F (37.2 C)] 97.9 F (36.6 C) (04/17 0528) Pulse Rate:  [67-107] 83 (04/17 0800) Resp:  [10-24] 17 (04/17 0800) BP: (79-126)/(33-105) 126/56 (04/17 0800) SpO2:  [87 %-100 %] 100 % (04/17 0745) Weight:  [54.4 kg] 54.4 kg (04/16 1947)    General:  Alert female in NAD Psych:  Pleasant, cooperative. Normal mood and affect Eyes: Pupils equal Ears:  Normal auditory acuity Nose: No deformity, discharge or lesions. NGT in place with dark red blood in tube and about 200 ml in canister Neck:  Supple, no masses felt Lungs:  Clear to auscultation.  Heart:  Regular rate, regular rhythm.  Abdomen:  Soft, nondistended, nontender, active bowel sounds, no masses felt Rectal :  Deferred Msk: Symmetrical without gross deformities.  Neurologic:  Alert, oriented, grossly normal neurologically Extremities : No edema Skin:  Intact without significant lesions.    Intake/Output from previous day: 04/16 0701 - 04/17 0700 In: 2100 [IV Piggyback:2100] Out: 200  [Emesis/NG output:200] Intake/Output this shift:  Total I/O In: -  Out: 300 [Emesis/NG output:300]    Principal Problem:   Acute blood loss anemia Active Problems:   Chronic pain   Adjustment disorder with mixed anxiety and depressed mood   OSA on CPAP   Presence of cerebrospinal fluid drainage device   Memory loss   Acute renal failure superimposed on stage 3a chronic kidney disease   Upper GI bleed   Aspiration pneumonia   Acute respiratory failure with hypoxia   Hyponatremia   Hypotension    Willette Cluster, NP-C @  03/01/2023, 8:58 AM

## 2023-03-01 NOTE — Progress Notes (Signed)
PROGRESS NOTE    Samantha [name]  VWU:[id number] DOB: 08-08-1966 DOA: 02/28/2023 PCP: Tresa Garter, MD   Brief Narrative:  Samantha [name] is a 57 y.o. female with medical history significant for hydrocephalus s/p VP shunt, CKD stage IIIa, HTN, HLD, facial twitching suspected due to partial complex seizure on Lamictal, iron deficiency anemia, chronic pain on morphine, depression/anxiety, OSA on CPAP who presented to the ED for evaluation of dizziness and nausea. She has been seeing black stools for about 2 weeks but initially thought this was secondary to her iron supplement.  Positive FOBT in ED and creatinine 5.16 (1.03 09/22/2022),  chest x-ray negative for focal consolidation, edema, effusion.  Moderate volume hiatal hernia noted. CT head unremarkable.  CT chest/abdomen/pelvis without contrast shows peribronchovascular groundglass airspace opacities, persistent moderate volume hiatal hernia with fluid distention of the esophageal lumen, masslike uterine fundus.  Given some IV fluids, started on Unasyn, transfused 1 unit of PRBC.  Admitted under hospitalist service, GI consulted.   Assessment & Plan:   Principal Problem:   Acute blood loss anemia Active Problems:   Acute renal failure superimposed on stage 3a chronic kidney disease   Aspiration pneumonia   Acute respiratory failure with hypoxia   Hyponatremia   Chronic pain   Adjustment disorder with mixed anxiety and depressed mood   OSA on CPAP   Presence of cerebrospinal fluid drainage device   Memory loss   Upper GI bleed   Hypotension  Acute blood loss anemia due to likely upper GI bleed with hiatal hernia and fluid distention of the esophageal lumen: Hemoglobin 6.5 (12.9 in November).  FOBT is positive.  Due to fluid in esophagus, NG tube was placed.  Started on Protonix 40 mg IV twice daily.  Transfused 1 unit of PRBC, hemoglobin 10 posttransfusion.  GI on board, plan for EGD either later today or tomorrow.   Acute  renal failure:  Creatinine 5.16, previously 1.03 five months ago.  Patient does not carry any history of CKD as mentioned in H&P, CKD ruled out.  This is likely prerenal due to hypotension and anemia.  She reports good urine output.  CT without evidence of obstruction or retention.  Received some IV fluids and creatinine improved to 2.98.  Will resume IV fluids at 100 cc/h for 20 hours.  Repeat labs in the morning.  Avoid nephrotoxic agents.   Acute hypoxic respiratory failure secondary to aspiration pneumonia: Continue Unasyn.  Was requiring 1 to 2 L of oxygen upon admission but she is not hypoxic anymore and she is not symptomatic either.   Hyponatremia: Sodium 127, likely volume depleted.  Received IV fluids, currently 134.   History of hypertension but came in with hypotension: Secondary to volume depletion and GI bleed.  Improved, resume IV fluids.  Avoid and hold antihypertensives.   Chronic pain: She is on chronic narcotics as an outpatient, morphine 30 mg q12h and oxycodone 10 mg TID prn.  Will resume some of the oral opioids in order to prevent withdrawal.   Hyperlipidemia: Resume statins.   Depression/anxiety: Will resume all home medications.   History of facial twitching/partial complex seizure: Resume Lamictal    Chronic hydrocephalus s/p VP shunt: CT head with stable appearance.   Memory issues: Holding Namenda and Aricept while NPO.   OSA on CPAP: Use supplemental O2 nightly while NG tube in place.  DVT prophylaxis: SCDs Start: 02/28/23 2015   Code Status: Full Code  Family Communication:  None present at bedside.  Plan of care discussed with patient in length and he/she verbalized understanding and agreed with it.  Status is: Inpatient Remains inpatient appropriate because: Scheduled for EGD.   Estimated body mass index is 25.08 kg/m as calculated from the following:   Height as of this encounter: 4\' 10"  (1.473 m).   Weight as of this encounter: 54.4  kg.    Nutritional Assessment: Body mass index is 25.08 kg/m.Marland Kitchen Seen by dietician.  I agree with the assessment and plan as outlined below: Nutrition Status:        . Skin Assessment: I have examined the patient's skin and I agree with the wound assessment as performed by the wound care RN as outlined below:    Consultants:  GI  Procedures:  None  Antimicrobials:  Anti-infectives (From admission, onward)    Start     Dose/Rate Route Frequency Ordered Stop   03/01/23 0600  Ampicillin-Sulbactam (UNASYN) 3 g in sodium chloride 0.9 % 100 mL IVPB        3 g 200 mL/hr over 30 Minutes Intravenous Daily 02/28/23 2017     02/28/23 1830  Ampicillin-Sulbactam (UNASYN) 3 g in sodium chloride 0.9 % 100 mL IVPB        3 g 200 mL/hr over 30 Minutes Intravenous  Once 02/28/23 1820 02/28/23 1913         Subjective: Patient seen and examined.  She says that she is feeling better than yesterday but not back to baseline yet.  No specific complaint.  Objective: Vitals:   03/01/23 0730 03/01/23 0745 03/01/23 0800 03/01/23 0917  BP: (!) 121/57 116/62 (!) 126/56   Pulse: 81 77 83   Resp: 18 20 17    Temp:    97.9 F (36.6 C)  TempSrc:    Oral  SpO2:  100%    Weight:      Height:        Intake/Output Summary (Last 24 hours) at 03/01/2023 1011 Last data filed at 03/01/2023 0718 Gross per 24 hour  Intake 2100 ml  Output 500 ml  Net 1600 ml   Filed Weights   02/28/23 1947  Weight: 54.4 kg    Examination:  General exam: Appears calm and comfortable, still appears pale. Respiratory system: Clear to auscultation. Respiratory effort normal. Cardiovascular system: S1 & S2 heard, RRR. No JVD, murmurs, rubs, gallops or clicks. No pedal edema. Gastrointestinal system: Abdomen is nondistended, soft and nontender. No organomegaly or masses felt. Normal bowel sounds heard. Central nervous system: Alert and oriented. No focal neurological deficits. Extremities: Symmetric 5 x 5  power. Skin: No rashes, lesions or ulcers Psychiatry: Judgement and insight appear normal. Mood & affect appropriate.    Data Reviewed: I have personally reviewed following labs and imaging studies  CBC: Recent Labs  Lab 02/28/23 1705 02/28/23 1723 03/01/23 0228  WBC 15.8*  --  12.8*  NEUTROABS 13.7*  --   --   HGB 8.2* 6.5*  6.8* 10.0*  HCT 24.1* 19.0*  20.0* 28.9*  MCV 97.6  --  94.1  PLT 341  --  272   Basic Metabolic Panel: Recent Labs  Lab 02/28/23 1705 02/28/23 1723 03/01/23 0228  NA 127* 131*  131* 134*  K 4.1 3.4*  3.4* 3.9  CL 93* 102 105  CO2 17*  --  20*  GLUCOSE 108* 87 97  BUN 90* 92* 83*  CREATININE 5.16* 4.90* 2.98*  CALCIUM 8.1*  --  8.0*  MG 1.8  --   --  GFR: Estimated Creatinine Clearance: 15 mL/min (A) (by C-G formula based on SCr of 2.98 mg/dL (H)). Liver Function Tests: Recent Labs  Lab 02/28/23 1705  AST 23  ALT 18  ALKPHOS 48  BILITOT 0.9  PROT 5.4*  ALBUMIN 2.9*   Recent Labs  Lab 02/28/23 1705  LIPASE 30   No results for input(s): "AMMONIA" in the last 168 hours. Coagulation Profile: Recent Labs  Lab 02/28/23 1705  INR 1.1   Cardiac Enzymes: No results for input(s): "CKTOTAL", "CKMB", "CKMBINDEX", "TROPONINI" in the last 168 hours. BNP (last 3 results) No results for input(s): "PROBNP" in the last 8760 hours. HbA1C: No results for input(s): "HGBA1C" in the last 72 hours. CBG: Recent Labs  Lab 02/28/23 1657  GLUCAP 109*   Lipid Profile: No results for input(s): "CHOL", "HDL", "LDLCALC", "TRIG", "CHOLHDL", "LDLDIRECT" in the last 72 hours. Thyroid Function Tests: No results for input(s): "TSH", "T4TOTAL", "FREET4", "T3FREE", "THYROIDAB" in the last 72 hours. Anemia Panel: No results for input(s): "VITAMINB12", "FOLATE", "FERRITIN", "TIBC", "IRON", "RETICCTPCT" in the last 72 hours. Sepsis Labs: Recent Labs  Lab 02/28/23 1716 02/28/23 1908  LATICACIDVEN 1.1 1.1    Recent Results (from the past 240  hour(s))  Resp panel by RT-PCR (RSV, Flu A&B, Covid) Anterior Nasal Swab     Status: None   Collection Time: 02/28/23  5:05 PM   Specimen: Anterior Nasal Swab  Result Value Ref Range Status   SARS Coronavirus 2 by RT PCR NEGATIVE NEGATIVE Final   Influenza A by PCR NEGATIVE NEGATIVE Final   Influenza B by PCR NEGATIVE NEGATIVE Final    Comment: (NOTE) The Xpert Xpress SARS-CoV-2/FLU/RSV plus assay is intended as an aid in the diagnosis of influenza from Nasopharyngeal swab specimens and should not be used as a sole basis for treatment. Nasal washings and aspirates are unacceptable for Xpert Xpress SARS-CoV-2/FLU/RSV testing.  Fact Sheet for Patients: BloggerCourse.com  Fact Sheet for Healthcare Providers: SeriousBroker.it  This test is not yet approved or cleared by the [place] FDA and has been authorized for detection and/or diagnosis of SARS-CoV-2 by FDA under an Emergency Use Authorization (EUA). This EUA will remain in effect (meaning this test can be used) for the duration of the COVID-19 declaration under Section 564(b)(1) of the Act, 21 U.S.C. section 360bbb-3(b)(1), unless the authorization is terminated or revoked.     Resp Syncytial Virus by PCR NEGATIVE NEGATIVE Final    Comment: (NOTE) Fact Sheet for Patients: BloggerCourse.com  Fact Sheet for Healthcare Providers: SeriousBroker.it  This test is not yet approved or cleared by the [place] FDA and has been authorized for detection and/or diagnosis of SARS-CoV-2 by FDA under an Emergency Use Authorization (EUA). This EUA will remain in effect (meaning this test can be used) for the duration of the COVID-19 declaration under Section 564(b)(1) of the Act, 21 U.S.C. section 360bbb-3(b)(1), unless the authorization is terminated or revoked.  Performed at [facility], [address], [place],  [place] [address]   Blood Culture (routine x 2)     Status: None (Preliminary result)   Collection Time: 02/28/23  5:10 PM   Specimen: BLOOD  Result Value Ref Range Status   Specimen Description BLOOD BLOOD RIGHT ARM  Final   Special Requests   Final    BOTTLES DRAWN AEROBIC AND ANAEROBIC Blood Culture results may not be optimal due to an excessive volume of blood received in culture bottles   Culture   Final  NO GROWTH < 24 HOURS Performed at [facility], [address] [place], [place] [address]    Report Status PENDING  Incomplete  Blood Culture (routine x 2)     Status: None (Preliminary result)   Collection Time: 02/28/23  5:18 PM   Specimen: BLOOD LEFT HAND  Result Value Ref Range Status   Specimen Description BLOOD LEFT HAND  Final   Special Requests   Final    BOTTLES DRAWN AEROBIC ONLY Blood Culture results may not be optimal due to an inadequate volume of blood received in culture bottles   Culture   Final    NO GROWTH < 24 HOURS Performed at [facility], [address], [place], [place] [address]    Report Status PENDING  Incomplete     Radiology Studies: CT Head Wo Contrast  Result Date: 02/28/2023 CLINICAL DATA:  Syncope/presyncope, cerebrovascular cause suspected EXAM: CT HEAD WITHOUT CONTRAST TECHNIQUE: Contiguous axial images were obtained from the base of the skull through the vertex without intravenous contrast. RADIATION DOSE REDUCTION: This exam was performed according to the departmental dose-optimization program which includes automated exposure control, adjustment of the mA and/or kV according to patient size and/or use of iterative reconstruction technique. COMPARISON:  CT head 05/01/2022 FINDINGS: Brain: Post right posterior approach ventricular peritoneal shunt. Similar-appearing right frontal lobe encephalomalacia. No evidence of large-territorial acute infarction. No parenchymal hemorrhage. No mass lesion. No extra-axial collection. No mass effect  or midline shift. Persistent stable enlarged lateral and third ventricles. Basilar cisterns are patent. Vascular: No hyperdense vessel. Vascular clipping again noted along the circle-of-Willis on the left. Skull: No acute fracture or focal lesion.  Right frontal craniotomy. Sinuses/Orbits: Paranasal sinuses and mastoid air cells are clear. The orbits are unremarkable. Other: None. IMPRESSION: No acute intracranial abnormality in a patient with chronic stable non-communicating hydrocephalus. Electronically Signed   By: Tish Frederickson M.D.   On: 02/28/2023 18:21   CT CHEST ABDOMEN PELVIS WO CONTRAST  Result Date: 02/28/2023 CLINICAL DATA:  Pt states she has been dizzy x 5-6 days, having nausea x 2 days EXAM: CT CHEST, ABDOMEN AND PELVIS WITHOUT CONTRAST TECHNIQUE: Multidetector CT imaging of the chest, abdomen and pelvis was performed following the standard protocol without IV contrast. RADIATION DOSE REDUCTION: This exam was performed according to the departmental dose-optimization program which includes automated exposure control, adjustment of the mA and/or kV according to patient size and/or use of iterative reconstruction technique. COMPARISON:  None Available. FINDINGS: CT CHEST FINDINGS Cardiovascular: Normal heart size. No significant pericardial effusion. The thoracic aorta is normal in caliber. No atherosclerotic plaque of the thoracic aorta. No coronary artery calcifications. Mediastinum/Nodes: No enlarged mediastinal, hilar, or axillary lymph nodes. Thyroid gland, trachea, and esophagus demonstrate no significant findings. Persistent moderate volume hiatal hernia. The esophageal lumen is diffusely distended with fluid. Lungs/Pleura: Peribronchovascular ground-glass airspace opacities. No focal consolidation. No pulmonary nodule. No pulmonary mass. No pleural effusion. No pneumothorax. Musculoskeletal: No chest wall abnormality. No suspicious lytic or blastic osseous lesions. No acute displaced  fracture. CT ABDOMEN PELVIS FINDINGS Hepatobiliary: No focal liver abnormality. Status post cholecystectomy. No biliary dilatation. Pancreas: No focal lesion. Normal pancreatic contour. No surrounding inflammatory changes. No main pancreatic ductal dilatation. Spleen: Normal in size without focal abnormality. Adrenals/Urinary Tract: No adrenal nodule bilaterally. No nephrolithiasis and no hydronephrosis. No definite contour-deforming renal mass. No ureterolithiasis or hydroureter. The urinary bladder is unremarkable. Stomach/Bowel: Stomach is distended with ingested material with no definite finding of obstruction.  No evidence of bowel wall thickening or dilatation. Appendix appears normal. Vascular/Lymphatic: No abdominal aorta or iliac aneurysm. Mild atherosclerotic plaque of the aorta and its branches. No abdominal, pelvic, or inguinal lymphadenopathy. Reproductive: Masslike uterine fundus suggestive of fibroid (7:94). Otherwise uterus and bilateral adnexa are unremarkable. Other: No intraperitoneal free fluid. No intraperitoneal free gas. No organized fluid collection. Musculoskeletal: No abdominal wall hernia or abnormality. No suspicious lytic or blastic osseous lesions. No acute displaced fracture. IMPRESSION: 1. Peribronchovascular ground-glass airspace opacities. Finding may represent pulmonary edema versus infection/inflammation (aspiration pneumonia). 2. Persistent moderate volume hiatal hernia with fluid distension of the esophageal lumen. Consider enteric tube placement. 3. Masslike uterine fundus suggestive of fibroid. 4. Otherwise limited evaluation on this noncontrast CT chest, abdomen, pelvis. Electronically Signed   By: Tish Frederickson M.D.   On: 02/28/2023 18:16   DG Chest Port 1 View  Result Date: 02/28/2023 CLINICAL DATA:  Questionable sepsis - evaluate for abnormality EXAM: PORTABLE CHEST 1 VIEW COMPARISON:  Chest x-ray 05/01/2022 FINDINGS: Right neck and chest ventriculoperitoneal shunt  catheter with similar irregular contour and likely calcification. Possible persistent catheter discontinuity of the level of the mid right chest. The heart and mediastinal contours are within normal limits. At least moderate volume hiatal hernia overlying the mediastinum. No focal consolidation. No pulmonary edema. No pleural effusion. No pneumothorax. No acute osseous abnormality. IMPRESSION: 1. No active disease. 2. At least moderate volume hiatal hernia. Electronically Signed   By: Tish Frederickson M.D.   On: 02/28/2023 17:53    Scheduled Meds:  atorvastatin  20 mg Oral Daily   busPIRone  10 mg Oral TID   donepezil  10 mg Oral QHS   DULoxetine  60 mg Oral Daily   gabapentin  600 mg Oral QHS   lamoTRIgine  100 mg Oral BID   memantine  5 mg Oral BID   morphine  30 mg Oral Q12H   pantoprazole (PROTONIX) IV  40 mg Intravenous Q12H   sodium chloride flush  3 mL Intravenous Q12H   Continuous Infusions:  sodium chloride     ampicillin-sulbactam (UNASYN) IV Stopped (03/01/23 0718)     LOS: 1 day   Hughie Closs, MD [facility]  03/01/2023, 10:11 AM   *Please note that this is a verbal dictation therefore any spelling or grammatical errors are due to the "Dragon Medical One" system interpretation.  Please page via Amion and do not message via secure chat for urgent patient care matters. Secure chat can be used for non urgent patient care matters.  How to contact the [facility] Attending or Consulting provider 7A - 7P or covering provider during after hours 7P -7A, for this patient?  Check the care team in [facility] and look for a) attending/consulting [facility] provider listed and b) the [facility] team listed. Page or secure chat 7A-7P. Log into [email or web address] and use [place]'s universal password to access. If you do not have the password, please contact the hospital operator. Locate the [facility] provider you are looking for under [facility] and page to a number that you can be directly reached. If you  still have difficulty reaching the provider, please page the [facility] (Director on Call) for the Hospitalists listed on amion for assistance.

## 2023-03-01 NOTE — ED Notes (Signed)
..ED TO INPATIENT HANDOFF REPORT  ED Nurse Name and Phone #: [phone]  S Name/Age/Gender Samantha [name] 57 y.o. female Room/Bed: 003C/003C  Code Status   Code Status: Full Code  Home/SNF/Other Home Patient oriented to: self, place, time, and situation Is this baseline? Yes   Triage Complete: Triage complete  Chief Complaint Acute blood loss anemia [D62]  Triage Note Pt from UC for generalized weakness with dizziness that has been ongoing for weeks. Pt was hypotensive yesterday in SBP 70s, today in SBP 70s as well went to UC. Pt hypoxic in the 80s placed on 2LPM via [place] and oxygen saturation improved.   95/51 initial 79/39   NIH 0  20 G LAC    Allergies Allergies  Allergen Reactions   Alprazolam Other (See Comments)    Crying all the time   Citalopram Hydrobromide Other (See Comments)    low libido   Olmesartan     Dizziness per patient    Level of Care/Admitting Diagnosis ED Disposition     ED Disposition  Admit   Condition  --   Comment  Hospital Area: [facility] [[id number]]  Level of Care: Progressive [102]  Admit to Progressive based on following criteria: GI, ENDOCRINE disease patients with GI bleeding, acute liver failure or pancreatitis, stable with diabetic ketoacidosis or thyrotoxicosis (hypothyroid) state.  May admit patient to [facility] or [facility] if equivalent level of care is available:: No  Covid Evaluation: Confirmed COVID Negative  Diagnosis: Acute blood loss anemia [161096]  Admitting Physician: Charlsie Quest [[id number]]  Attending Physician: Charlsie Quest [[id number]]  Certification:: I certify this patient will need inpatient services for at least 2 midnights  Estimated Length of Stay: 4          B Medical/Surgery History Past Medical History:  Diagnosis Date   Anxiety    Sheria Lang lesion, acute    Cerebral ventricular shunt fitting or adjustment    CKD (chronic kidney disease), stage III    COMMON MIGRAINE  10/01/2007   Chronic     Depression    Esophageal ulceration    GERD (gastroesophageal reflux disease)    GI bleed    GLUCOSE INTOLERANCE 10/01/2007   Qualifier: Diagnosis of  By: Jonny Ruiz MD, Len Blalock    Hiatal hernia    Hydrocephalus    Hyperlipidemia    HYPERLIPIDEMIA 10/01/2007   Chronic. Lovaza is too $$$ Declined statins due to worries    IDA (iron deficiency anemia)    Memory loss    Migraine    OBSTRUCTIVE SLEEP APNEA 10/01/2007   NPSG 2006:  AHI 9/hr Started cpap 2009 successfully.      Paresthesia    Prolonged QT interval    Schatzki's ring    Sleep apnea    Stroke 03/14/2013   Past Surgical History:  Procedure Laterality Date   Ames Shunt  1976,   cerebral vascular shunt/ with a revision 1981   BIOPSY  05/02/2022   Procedure: BIOPSY;  Surgeon: Imogene Burn, MD;  Location: [facility] ENDOSCOPY;  Service: Gastroenterology;;   CHOLECYSTECTOMY  1981   CYST REMOVAL NECK     ESOPHAGOGASTRODUODENOSCOPY (EGD) WITH PROPOFOL N/A 05/02/2022   Procedure: ESOPHAGOGASTRODUODENOSCOPY (EGD) WITH PROPOFOL;  Surgeon: Imogene Burn, MD;  Location: [facility] ENDOSCOPY;  Service: Gastroenterology;  Laterality: N/A;   Pituitary cyst w/subsequent shunt       A IV Location/Drains/Wounds Patient Lines/Drains/Airways Status     Active Line/Drains/Airways  Name Placement date Placement time Site Days   Peripheral IV 02/28/23 20 G Posterior;Left Hand 02/28/23  --  Hand  1   Peripheral IV 02/28/23 20 G Left Antecubital 02/28/23  1705  Antecubital  1   NG/OG Vented/Dual Lumen 14 Fr. Left nare External length of tube 64 cm 02/28/23  1915  Left nare  1            Intake/Output Last 24 hours  Intake/Output Summary (Last 24 hours) at 03/01/2023 1138 Last data filed at 03/01/2023 6045 Gross per 24 hour  Intake 2100 ml  Output 500 ml  Net 1600 ml    Labs/Imaging Results for orders placed or performed during the hospital encounter of 02/28/23 (from the past 48 hour(s))  CBG monitoring, ED      Status: Abnormal   Collection Time: 02/28/23  4:57 PM  Result Value Ref Range   Glucose-Capillary 109 (H) 70 - 99 mg/dL    Comment: Glucose reference range applies only to samples taken after fasting for at least 8 hours.  Resp panel by RT-PCR (RSV, Flu A&B, Covid) Anterior Nasal Swab     Status: None   Collection Time: 02/28/23  5:05 PM   Specimen: Anterior Nasal Swab  Result Value Ref Range   SARS Coronavirus 2 by RT PCR NEGATIVE NEGATIVE   Influenza A by PCR NEGATIVE NEGATIVE   Influenza B by PCR NEGATIVE NEGATIVE    Comment: (NOTE) The Xpert Xpress SARS-CoV-2/FLU/RSV plus assay is intended as an aid in the diagnosis of influenza from Nasopharyngeal swab specimens and should not be used as a sole basis for treatment. Nasal washings and aspirates are unacceptable for Xpert Xpress SARS-CoV-2/FLU/RSV testing.  Fact Sheet for Patients: BloggerCourse.com  Fact Sheet for Healthcare Providers: SeriousBroker.it  This test is not yet approved or cleared by the [place] FDA and has been authorized for detection and/or diagnosis of SARS-CoV-2 by FDA under an Emergency Use Authorization (EUA). This EUA will remain in effect (meaning this test can be used) for the duration of the COVID-19 declaration under Section 564(b)(1) of the Act, 21 U.S.C. section 360bbb-3(b)(1), unless the authorization is terminated or revoked.     Resp Syncytial Virus by PCR NEGATIVE NEGATIVE    Comment: (NOTE) Fact Sheet for Patients: BloggerCourse.com  Fact Sheet for Healthcare Providers: SeriousBroker.it  This test is not yet approved or cleared by the [place] FDA and has been authorized for detection and/or diagnosis of SARS-CoV-2 by FDA under an Emergency Use Authorization (EUA). This EUA will remain in effect (meaning this test can be used) for the duration of the COVID-19 declaration under  Section 564(b)(1) of the Act, 21 U.S.C. section 360bbb-3(b)(1), unless the authorization is terminated or revoked.  Performed at [facility], [address] [place], [place] [address]   Comprehensive metabolic panel     Status: Abnormal   Collection Time: 02/28/23  5:05 PM  Result Value Ref Range   Sodium 127 (L) 135 - 145 mmol/L   Potassium 4.1 3.5 - 5.1 mmol/L   Chloride 93 (L) 98 - 111 mmol/L   CO2 17 (L) 22 - 32 mmol/L   Glucose, Bld 108 (H) 70 - 99 mg/dL    Comment: Glucose reference range applies only to samples taken after fasting for at least 8 hours.   BUN 90 (H) 6 - 20 mg/dL   Creatinine, Ser 1.91 (H) 0.44 - 1.00 mg/dL   Calcium 8.1 (L) 8.9 - 10.3 mg/dL  Total Protein 5.4 (L) 6.5 - 8.1 g/dL   Albumin 2.9 (L) 3.5 - 5.0 g/dL   AST 23 15 - 41 U/L   ALT 18 0 - 44 U/L   Alkaline Phosphatase 48 38 - 126 U/L   Total Bilirubin 0.9 0.3 - 1.2 mg/dL   GFR, Estimated 9 (L) >60 mL/min    Comment: (NOTE) Calculated using the CKD-EPI Creatinine Equation (2021)    Anion gap 17 (H) 5 - 15    Comment: Performed at [facility], [address], , [place] [address]  CBC with Differential     Status: Abnormal   Collection Time: 02/28/23  5:05 PM  Result Value Ref Range   WBC 15.8 (H) 4.0 - 10.5 K/uL   RBC 2.47 (L) 3.87 - 5.11 MIL/uL   Hemoglobin 8.2 (L) 12.0 - 15.0 g/dL   HCT 60.4 (L) 54.0 - 98.1 %   MCV 97.6 80.0 - 100.0 fL   MCH 33.2 26.0 - 34.0 pg   MCHC 34.0 30.0 - 36.0 g/dL   RDW 19.1 47.8 - 29.5 %   Platelets 341 150 - 400 K/uL   nRBC 0.0 0.0 - 0.2 %   Neutrophils Relative % 87 %   Neutro Abs 13.7 (H) 1.7 - 7.7 K/uL   Lymphocytes Relative 5 %   Lymphs Abs 0.8 0.7 - 4.0 K/uL   Monocytes Relative 7 %   Monocytes Absolute 1.2 (H) 0.1 - 1.0 K/uL   Eosinophils Relative 0 %   Eosinophils Absolute 0.0 0.0 - 0.5 K/uL   Basophils Relative 0 %   Basophils Absolute 0.0 0.0 - 0.1 K/uL   Immature Granulocytes 1 %   Abs Immature Granulocytes 0.08 (H) 0.00 -  0.07 K/uL    Comment: Performed at [facility], [address] [place], [place] [address]  Protime-INR     Status: None   Collection Time: 02/28/23  5:05 PM  Result Value Ref Range   Prothrombin Time 14.2 11.4 - 15.2 seconds   INR 1.1 0.8 - 1.2    Comment: (NOTE) INR goal varies based on device and disease states. Performed at [facility], [address], [place], [place] [address]   Troponin I (High Sensitivity)     Status: None   Collection Time: 02/28/23  5:05 PM  Result Value Ref Range   Troponin I (High Sensitivity) 7 <18 ng/L    Comment: (NOTE) Elevated high sensitivity troponin I (hsTnI) values and significant  changes across serial measurements may suggest ACS but many other  chronic and acute conditions are known to elevate hsTnI results.  Refer to the "Links" section for chest pain algorithms and additional  guidance. Performed at [facility], [address], [place], [place] [address]   Magnesium     Status: None   Collection Time: 02/28/23  5:05 PM  Result Value Ref Range   Magnesium 1.8 1.7 - 2.4 mg/dL    Comment: Performed at [facility], [address] [place], [place] [address]  Lipase, blood     Status: None   Collection Time: 02/28/23  5:05 PM  Result Value Ref Range   Lipase 30 11 - 51 U/L    Comment: Performed at [facility], [address], [place], [place] [address]  Brain natriuretic peptide     Status: None   Collection Time: 02/28/23  5:06 PM  Result Value Ref Range   B Natriuretic  Peptide 19.3 0.0 - 100.0 pg/mL    Comment: Performed at [facility], [address] [place], [place] [address]  Urinalysis, w/ Reflex to Culture (Infection Suspected) -Urine, Clean Catch     Status: Abnormal   Collection Time: 02/28/23  5:06 PM  Result Value Ref Range   Specimen Source URINE, CLEAN CATCH    Color, Urine STRAW (A) YELLOW   APPearance CLEAR CLEAR   Specific Gravity, Urine 1.008 1.005 - 1.030   pH 5.0 5.0 -  8.0   Glucose, UA NEGATIVE NEGATIVE mg/dL   Hgb urine dipstick MODERATE (A) NEGATIVE   Bilirubin Urine NEGATIVE NEGATIVE   Ketones, ur NEGATIVE NEGATIVE mg/dL   Protein, ur NEGATIVE NEGATIVE mg/dL   Nitrite NEGATIVE NEGATIVE   Leukocytes,Ua TRACE (A) NEGATIVE   RBC / HPF 0-5 0 - 5 RBC/hpf   WBC, UA 0-5 0 - 5 WBC/hpf    Comment:        Reflex urine culture not performed if WBC <=10, OR if Squamous epithelial cells >5. If Squamous epithelial cells >5 suggest recollection.    Bacteria, UA RARE (A) NONE SEEN   Squamous Epithelial / HPF 0-5 0 - 5 /HPF   Mucus PRESENT    Hyaline Casts, UA PRESENT     Comment: Performed at [facility], [address], [place], [place] [address]  Blood Culture (routine x 2)     Status: None (Preliminary result)   Collection Time: 02/28/23  5:10 PM   Specimen: BLOOD  Result Value Ref Range   Specimen Description BLOOD BLOOD RIGHT ARM    Special Requests      BOTTLES DRAWN AEROBIC AND ANAEROBIC Blood Culture results may not be optimal due to an excessive volume of blood received in culture bottles   Culture      NO GROWTH < 24 HOURS Performed at [facility], [address] [place], [place] [address]    Report Status PENDING   Lactic acid, plasma     Status: None   Collection Time: 02/28/23  5:16 PM  Result Value Ref Range   Lactic Acid, Venous 1.1 0.5 - 1.9 mmol/L    Comment: Performed at [facility], [address], [place], [place] [address]  Blood Culture (routine x 2)     Status: None (Preliminary result)   Collection Time: 02/28/23  5:18 PM   Specimen: BLOOD LEFT HAND  Result Value Ref Range   Specimen Description BLOOD LEFT HAND    Special Requests      BOTTLES DRAWN AEROBIC ONLY Blood Culture results may not be optimal due to an inadequate volume of blood received in culture bottles   Culture      NO GROWTH < 24 HOURS Performed at [facility], [address] [place], [place] [address]    Report Status  PENDING   I-Stat Chem 8, ED     Status: Abnormal   Collection Time: 02/28/23  5:23 PM  Result Value Ref Range   Sodium 131 (L) 135 - 145 mmol/L   Potassium 3.4 (L) 3.5 - 5.1 mmol/L   Chloride 102 98 - 111 mmol/L   BUN 92 (H) 6 - 20 mg/dL   Creatinine, Ser 1.30 (H) 0.44 - 1.00 mg/dL   Glucose, Bld 87 70 - 99 mg/dL    Comment: Glucose reference range applies only to samples taken after fasting for at least 8 hours.   Calcium, Ion 0.95 (L)  1.15 - 1.40 mmol/L   TCO2 18 (L) 22 - 32 mmol/L   Hemoglobin 6.5 (LL) 12.0 - 15.0 g/dL   HCT 21.3 (L) 08.6 - 57.8 %   Comment NOTIFIED PHYSICIAN   I-Stat venous blood gas, ED ([facility])     Status: Abnormal   Collection Time: 02/28/23  5:23 PM  Result Value Ref Range   pH, Ven 7.251 7.25 - 7.43   pCO2, Ven 35.0 (L) 44 - 60 mmHg   pO2, Ven 87 (H) 32 - 45 mmHg   Bicarbonate 15.4 (L) 20.0 - 28.0 mmol/L   TCO2 16 (L) 22 - 32 mmol/L   O2 Saturation 95 %   Acid-base deficit 11.0 (H) 0.0 - 2.0 mmol/L   Sodium 131 (L) 135 - 145 mmol/L   Potassium 3.4 (L) 3.5 - 5.1 mmol/L   Calcium, Ion 0.95 (L) 1.15 - 1.40 mmol/L   HCT 20.0 (L) 36.0 - 46.0 %   Hemoglobin 6.8 (LL) 12.0 - 15.0 g/dL   Sample type VENOUS    Comment NOTIFIED PHYSICIAN   POC occult blood, ED     Status: Abnormal   Collection Time: 02/28/23  6:43 PM  Result Value Ref Range   Fecal Occult Bld POSITIVE (A) NEGATIVE  Prepare RBC (crossmatch)     Status: None   Collection Time: 02/28/23  6:47 PM  Result Value Ref Range   Order Confirmation      ORDER PROCESSED BY BLOOD BANK Performed at [facility], [address], [place], [place] [address]   Lactic acid, plasma     Status: None   Collection Time: 02/28/23  7:08 PM  Result Value Ref Range   Lactic Acid, Venous 1.1 0.5 - 1.9 mmol/L    Comment: Performed at [facility], [address] [place], [place] [address]  Troponin I (High Sensitivity)     Status: None   Collection Time: 02/28/23  7:08 PM  Result Value Ref Range    Troponin I (High Sensitivity) 4 <18 ng/L    Comment: (NOTE) Elevated high sensitivity troponin I (hsTnI) values and significant  changes across serial measurements may suggest ACS but many other  chronic and acute conditions are known to elevate hsTnI results.  Refer to the "Links" section for chest pain algorithms and additional  guidance. Performed at [facility], [address], [place], [place] [address]   Type and screen [facility]     Status: None   Collection Time: 02/28/23  7:08 PM  Result Value Ref Range   ABO/RH(D) A POS    Antibody Screen NEG    Sample Expiration 03/03/2023,2359    Unit Number [id number]    Blood Component Type RED CELLS,LR    Unit division 00    Status of Unit ISSUED,FINAL    Transfusion Status OK TO TRANSFUSE    Crossmatch Result      Compatible Performed at [facility], [address], [place], [place] [address]   CBC     Status: Abnormal   Collection Time: 03/01/23  2:28 AM  Result Value Ref Range   WBC 12.8 (H) 4.0 - 10.5 K/uL   RBC 3.07 (L) 3.87 - 5.11 MIL/uL   Hemoglobin 10.0 (L) 12.0 - 15.0 g/dL   HCT 59.5 (L) 63.8 - 75.6 %   MCV 94.1 80.0 - 100.0 fL   MCH 32.6 26.0 - 34.0 pg   MCHC 34.6 30.0 - 36.0 g/dL  RDW 13.4 11.5 - 15.5 %   Platelets 272 150 - 400 K/uL   nRBC 0.0 0.0 - 0.2 %    Comment: Performed at [facility], [address] [place], [place] [address]  Basic metabolic panel     Status: Abnormal   Collection Time: 03/01/23  2:28 AM  Result Value Ref Range   Sodium 134 (L) 135 - 145 mmol/L   Potassium 3.9 3.5 - 5.1 mmol/L   Chloride 105 98 - 111 mmol/L   CO2 20 (L) 22 - 32 mmol/L   Glucose, Bld 97 70 - 99 mg/dL    Comment: Glucose reference range applies only to samples taken after fasting for at least 8 hours.   BUN 83 (H) 6 - 20 mg/dL   Creatinine, Ser 6.21 (H) 0.44 - 1.00 mg/dL   Calcium 8.0 (L) 8.9 - 10.3 mg/dL   GFR, Estimated 18 (L) >60 mL/min    Comment: (NOTE) Calculated  using the CKD-EPI Creatinine Equation (2021)    Anion gap 9 5 - 15    Comment: Performed at [facility], [address], [place], [place] [address]   CT Head Wo Contrast  Result Date: 02/28/2023 CLINICAL DATA:  Syncope/presyncope, cerebrovascular cause suspected EXAM: CT HEAD WITHOUT CONTRAST TECHNIQUE: Contiguous axial images were obtained from the base of the skull through the vertex without intravenous contrast. RADIATION DOSE REDUCTION: This exam was performed according to the departmental dose-optimization program which includes automated exposure control, adjustment of the mA and/or kV according to patient size and/or use of iterative reconstruction technique. COMPARISON:  CT head 05/01/2022 FINDINGS: Brain: Post right posterior approach ventricular peritoneal shunt. Similar-appearing right frontal lobe encephalomalacia. No evidence of large-territorial acute infarction. No parenchymal hemorrhage. No mass lesion. No extra-axial collection. No mass effect or midline shift. Persistent stable enlarged lateral and third ventricles. Basilar cisterns are patent. Vascular: No hyperdense vessel. Vascular clipping again noted along the circle-of-Willis on the left. Skull: No acute fracture or focal lesion.  Right frontal craniotomy. Sinuses/Orbits: Paranasal sinuses and mastoid air cells are clear. The orbits are unremarkable. Other: None. IMPRESSION: No acute intracranial abnormality in a patient with chronic stable non-communicating hydrocephalus. Electronically Signed   By: Tish Frederickson M.D.   On: 02/28/2023 18:21   CT CHEST ABDOMEN PELVIS WO CONTRAST  Result Date: 02/28/2023 CLINICAL DATA:  Pt states she has been dizzy x 5-6 days, having nausea x 2 days EXAM: CT CHEST, ABDOMEN AND PELVIS WITHOUT CONTRAST TECHNIQUE: Multidetector CT imaging of the chest, abdomen and pelvis was performed following the standard protocol without IV contrast. RADIATION DOSE REDUCTION: This exam was performed  according to the departmental dose-optimization program which includes automated exposure control, adjustment of the mA and/or kV according to patient size and/or use of iterative reconstruction technique. COMPARISON:  None Available. FINDINGS: CT CHEST FINDINGS Cardiovascular: Normal heart size. No significant pericardial effusion. The thoracic aorta is normal in caliber. No atherosclerotic plaque of the thoracic aorta. No coronary artery calcifications. Mediastinum/Nodes: No enlarged mediastinal, hilar, or axillary lymph nodes. Thyroid gland, trachea, and esophagus demonstrate no significant findings. Persistent moderate volume hiatal hernia. The esophageal lumen is diffusely distended with fluid. Lungs/Pleura: Peribronchovascular ground-glass airspace opacities. No focal consolidation. No pulmonary nodule. No pulmonary mass. No pleural effusion. No pneumothorax. Musculoskeletal: No chest wall abnormality. No suspicious lytic or blastic osseous lesions. No acute displaced fracture. CT ABDOMEN PELVIS FINDINGS Hepatobiliary: No focal liver abnormality. Status post cholecystectomy. No biliary dilatation. Pancreas: No focal  lesion. Normal pancreatic contour. No surrounding inflammatory changes. No main pancreatic ductal dilatation. Spleen: Normal in size without focal abnormality. Adrenals/Urinary Tract: No adrenal nodule bilaterally. No nephrolithiasis and no hydronephrosis. No definite contour-deforming renal mass. No ureterolithiasis or hydroureter. The urinary bladder is unremarkable. Stomach/Bowel: Stomach is distended with ingested material with no definite finding of obstruction. No evidence of bowel wall thickening or dilatation. Appendix appears normal. Vascular/Lymphatic: No abdominal aorta or iliac aneurysm. Mild atherosclerotic plaque of the aorta and its branches. No abdominal, pelvic, or inguinal lymphadenopathy. Reproductive: Masslike uterine fundus suggestive of fibroid (7:94). Otherwise uterus and  bilateral adnexa are unremarkable. Other: No intraperitoneal free fluid. No intraperitoneal free gas. No organized fluid collection. Musculoskeletal: No abdominal wall hernia or abnormality. No suspicious lytic or blastic osseous lesions. No acute displaced fracture. IMPRESSION: 1. Peribronchovascular ground-glass airspace opacities. Finding may represent pulmonary edema versus infection/inflammation (aspiration pneumonia). 2. Persistent moderate volume hiatal hernia with fluid distension of the esophageal lumen. Consider enteric tube placement. 3. Masslike uterine fundus suggestive of fibroid. 4. Otherwise limited evaluation on this noncontrast CT chest, abdomen, pelvis. Electronically Signed   By: Tish Frederickson M.D.   On: 02/28/2023 18:16   DG Chest Port 1 View  Result Date: 02/28/2023 CLINICAL DATA:  Questionable sepsis - evaluate for abnormality EXAM: PORTABLE CHEST 1 VIEW COMPARISON:  Chest x-ray 05/01/2022 FINDINGS: Right neck and chest ventriculoperitoneal shunt catheter with similar irregular contour and likely calcification. Possible persistent catheter discontinuity of the level of the mid right chest. The heart and mediastinal contours are within normal limits. At least moderate volume hiatal hernia overlying the mediastinum. No focal consolidation. No pulmonary edema. No pleural effusion. No pneumothorax. No acute osseous abnormality. IMPRESSION: 1. No active disease. 2. At least moderate volume hiatal hernia. Electronically Signed   By: Tish Frederickson M.D.   On: 02/28/2023 17:53    Pending Labs Unresulted Labs (From admission, onward)     Start     Ordered   03/01/23 1700  CBC with Differential/Platelet  5A & 5P,   R (with TIMED occurrences)      03/01/23 1019   03/01/23 0000  Hemoglobin and hematocrit, blood  Once,   R        02/28/23 2055   02/28/23 2016  Creatinine, urine, random  Once,   R        02/28/23 2017   02/28/23 2016  Sodium, urine, random  Once,   R        02/28/23 2017             Vitals/Pain Today's Vitals   03/01/23 0745 03/01/23 0800 03/01/23 0917 03/01/23 1129  BP: 116/62 (!) 126/56  (!) 118/103  Pulse: 77 83  94  Resp: 20 17  18   Temp:   97.9 F (36.6 C) 97.9 F (36.6 C)  TempSrc:   Oral Oral  SpO2: 100%   94%  Weight:      Height:      PainSc:        Isolation Precautions No active isolations  Medications Medications  pantoprazole (PROTONIX) injection 40 mg (40 mg Intravenous Given 02/28/23 1924)  sodium chloride flush (NS) 0.9 % injection 3 mL (3 mLs Intravenous Not Given 03/01/23 0253)  acetaminophen (TYLENOL) tablet 650 mg (has no administration in time range)    Or  acetaminophen (TYLENOL) suppository 650 mg (has no administration in time range)  0.9 %  sodium chloride infusion (0 mLs Intravenous Stopped 03/01/23 0836)  ondansetron (  ZOFRAN) tablet 4 mg (has no administration in time range)    Or  ondansetron (ZOFRAN) injection 4 mg (has no administration in time range)  Ampicillin-Sulbactam (UNASYN) 3 g in sodium chloride 0.9 % 100 mL IVPB (0 g Intravenous Stopped 03/01/23 0718)  HYDROmorphone (DILAUDID) injection 0.5 mg (0.5 mg Intravenous Given 02/28/23 2314)  0.9 %  sodium chloride infusion (has no administration in time range)  morphine (MS CONTIN) 12 hr tablet 30 mg (has no administration in time range)  atorvastatin (LIPITOR) tablet 20 mg (has no administration in time range)  busPIRone (BUSPAR) tablet 10 mg (has no administration in time range)  donepezil (ARICEPT) tablet 10 mg (has no administration in time range)  DULoxetine (CYMBALTA) DR capsule 60 mg (has no administration in time range)  memantine (NAMENDA) tablet 5 mg (has no administration in time range)  gabapentin (NEURONTIN) tablet 600 mg (has no administration in time range)  lamoTRIgine (LAMICTAL) tablet 100 mg (has no administration in time range)  sodium chloride 0.9 % bolus 1,000 mL (0 mLs Intravenous Stopped 02/28/23 1800)  sodium chloride 0.9 % bolus  1,000 mL (0 mLs Intravenous Stopped 02/28/23 1913)  metoCLOPramide (REGLAN) injection 10 mg (10 mg Intravenous Given 02/28/23 1832)  Ampicillin-Sulbactam (UNASYN) 3 g in sodium chloride 0.9 % 100 mL IVPB (0 g Intravenous Stopped 02/28/23 1913)  0.9 %  sodium chloride infusion (Manually program via Guardrails IV Fluids) (0 mLs Intravenous Stopped 02/28/23 2356)    Mobility walks     Focused Assessments Cardiac Assessment Handoff:    Lab Results  Component Value Date   CKTOTAL 65 11/30/2009   No results found for: "DDIMER" Does the Patient currently have chest pain? No   , Neuro Assessment Handoff:  Swallow screen pass? Yes          Neuro Assessment: Within Defined Limits Neuro Checks:      Has TPA been given? No If patient is a Neuro Trauma and patient is going to OR before floor call report to 4N Charge nurse: [phone] or [phone]   R Recommendations: See Admitting Provider Note  Report given to:   Additional Notes: na

## 2023-03-01 NOTE — Plan of Care (Signed)

## 2023-03-01 NOTE — Progress Notes (Signed)
PHARMACY NOTE:  ANTIMICROBIAL RENAL DOSAGE ADJUSTMENT  Current antimicrobial regimen includes a mismatch between antimicrobial dosage and estimated renal function.  As per policy approved by the Pharmacy & Therapeutics and Medical Executive Committees, the antimicrobial dosage will be adjusted accordingly.  Current antimicrobial dosage:  Unasyn 3g IV q24h  Indication: aspiration pneumonia  Renal Function:  Estimated Creatinine Clearance: 15 mL/min (A) (by C-G formula based on SCr of 2.98 mg/dL (H)).      On intermittent HD, scheduled:      On CRRT    Antimicrobial dosage has been changed to:  Unasyn 3g IV q12h  Additional comments:   Thank you for allowing pharmacy to be a part of this patient's care.  Rexford Maus, PharmD, BCPS 03/01/2023 8:39 PM

## 2023-03-01 NOTE — Anesthesia Procedure Notes (Signed)
Procedure Name: MAC Date/Time: 03/01/2023 1:52 PM  Performed by: De Nurse, CRNAPre-anesthesia Checklist: Patient identified, Emergency Drugs available, Suction available, Patient being monitored and Timeout performed Patient Re-evaluated:Patient Re-evaluated prior to induction Oxygen Delivery Method: Nasal cannula

## 2023-03-02 ENCOUNTER — Encounter (HOSPITAL_COMMUNITY): Payer: Self-pay | Admitting: Internal Medicine

## 2023-03-02 DIAGNOSIS — E871 Hypo-osmolality and hyponatremia: Secondary | ICD-10-CM | POA: Diagnosis not present

## 2023-03-02 DIAGNOSIS — K449 Diaphragmatic hernia without obstruction or gangrene: Secondary | ICD-10-CM

## 2023-03-02 DIAGNOSIS — F4323 Adjustment disorder with mixed anxiety and depressed mood: Secondary | ICD-10-CM

## 2023-03-02 DIAGNOSIS — N179 Acute kidney failure, unspecified: Secondary | ICD-10-CM

## 2023-03-02 DIAGNOSIS — D62 Acute posthemorrhagic anemia: Secondary | ICD-10-CM | POA: Diagnosis not present

## 2023-03-02 DIAGNOSIS — J9601 Acute respiratory failure with hypoxia: Secondary | ICD-10-CM | POA: Diagnosis not present

## 2023-03-02 DIAGNOSIS — E861 Hypovolemia: Secondary | ICD-10-CM

## 2023-03-02 DIAGNOSIS — Z982 Presence of cerebrospinal fluid drainage device: Secondary | ICD-10-CM

## 2023-03-02 DIAGNOSIS — K922 Gastrointestinal hemorrhage, unspecified: Secondary | ICD-10-CM | POA: Diagnosis not present

## 2023-03-02 DIAGNOSIS — K250 Acute gastric ulcer with hemorrhage: Secondary | ICD-10-CM

## 2023-03-02 DIAGNOSIS — K921 Melena: Secondary | ICD-10-CM | POA: Diagnosis not present

## 2023-03-02 DIAGNOSIS — G4733 Obstructive sleep apnea (adult) (pediatric): Secondary | ICD-10-CM

## 2023-03-02 DIAGNOSIS — R413 Other amnesia: Secondary | ICD-10-CM

## 2023-03-02 DIAGNOSIS — G8929 Other chronic pain: Secondary | ICD-10-CM

## 2023-03-02 DIAGNOSIS — N1831 Chronic kidney disease, stage 3a: Secondary | ICD-10-CM

## 2023-03-02 LAB — CBC WITH DIFFERENTIAL/PLATELET
Abs Immature Granulocytes: 0.02 10*3/uL (ref 0.00–0.07)
Basophils Absolute: 0 10*3/uL (ref 0.0–0.1)
Basophils Relative: 0 %
Eosinophils Absolute: 0.2 10*3/uL (ref 0.0–0.5)
Eosinophils Relative: 2 %
HCT: 25.9 % — ABNORMAL LOW (ref 36.0–46.0)
Hemoglobin: 8.7 g/dL — ABNORMAL LOW (ref 12.0–15.0)
Immature Granulocytes: 0 %
Lymphocytes Relative: 26 %
Lymphs Abs: 2.2 10*3/uL (ref 0.7–4.0)
MCH: 32.5 pg (ref 26.0–34.0)
MCHC: 33.6 g/dL (ref 30.0–36.0)
MCV: 96.6 fL (ref 80.0–100.0)
Monocytes Absolute: 0.7 10*3/uL (ref 0.1–1.0)
Monocytes Relative: 9 %
Neutro Abs: 5.1 10*3/uL (ref 1.7–7.7)
Neutrophils Relative %: 63 %
Platelets: 249 10*3/uL (ref 150–400)
RBC: 2.68 MIL/uL — ABNORMAL LOW (ref 3.87–5.11)
RDW: 13.8 % (ref 11.5–15.5)
WBC: 8.2 10*3/uL (ref 4.0–10.5)
nRBC: 0 % (ref 0.0–0.2)

## 2023-03-02 LAB — BASIC METABOLIC PANEL
Anion gap: 7 (ref 5–15)
BUN: 18 mg/dL (ref 6–20)
CO2: 24 mmol/L (ref 22–32)
Calcium: 8.2 mg/dL — ABNORMAL LOW (ref 8.9–10.3)
Chloride: 106 mmol/L (ref 98–111)
Creatinine, Ser: 0.87 mg/dL (ref 0.44–1.00)
GFR, Estimated: >60 mL/min (ref >60)
Glucose, Bld: 88 mg/dL (ref 70–99)
Potassium: 3.9 mmol/L (ref 3.5–5.1)
Sodium: 137 mmol/L (ref 135–145)

## 2023-03-02 LAB — CULTURE, BLOOD (ROUTINE X 2): Culture: NO GROWTH

## 2023-03-02 MED ORDER — MELATONIN 3 MG PO TABS
3.0000 mg | ORAL_TABLET | Freq: Every day | ORAL | Status: DC
  Administered 2023-03-02: 3 mg via ORAL
  Filled 2023-03-02: qty 1

## 2023-03-02 MED ORDER — SODIUM CHLORIDE 0.9 % IV SOLN
3.0000 g | Freq: Three times a day (TID) | INTRAVENOUS | Status: DC
  Administered 2023-03-02 – 2023-03-03 (×3): 3 g via INTRAVENOUS
  Filled 2023-03-02 (×3): qty 8

## 2023-03-02 MED ORDER — SODIUM CHLORIDE 0.9 % IV SOLN: INTRAVENOUS | Status: DC

## 2023-03-02 MED ORDER — LINACLOTIDE 145 MCG PO CAPS
290.0000 ug | ORAL_CAPSULE | Freq: Every day | ORAL | Status: DC
  Administered 2023-03-03: 290 ug via ORAL
  Filled 2023-03-02: qty 2

## 2023-03-02 NOTE — Progress Notes (Signed)
PHARMACY NOTE:  ANTIMICROBIAL RENAL DOSAGE ADJUSTMENT  Current antimicrobial regimen includes a mismatch between antimicrobial dosage and estimated renal function.  As per policy approved by the Pharmacy & Therapeutics and Medical Executive Committees, the antimicrobial dosage will be adjusted accordingly.  Current antimicrobial dosage:  Unasyn 3 gm IV q12h  Indication: aspiration pneumonia  Renal Function:  Estimated Creatinine Clearance: 57.3 mL/min (by C-G formula based on SCr of 0.87 mg/dL).      On intermittent HD, scheduled:      On CRRT    Antimicrobial dosage has been changed to:  Unasyn 3 gm IV q8h  Thank you for allowing pharmacy to be a part of this patient's care.  Dennie Fetters, [facility] 03/02/2023 5:22 PM

## 2023-03-02 NOTE — Plan of Care (Signed)
  Problem: Elimination: Goal: Will not experience complications related to urinary retention Outcome: Progressing   Problem: Pain Managment: Goal: General experience of comfort will improve Outcome: Progressing   Problem: Safety: Goal: Ability to remain free from injury will improve Outcome: Progressing   

## 2023-03-02 NOTE — TOC Progression Note (Signed)
Transition of Care [facility]) - Progression Note    Patient Details  Name: Samantha [name] MRN: [id number] Date of Birth: 02-20-1966  Transition of Care [facility]) CM/SW Contact  Leone Haven, RN Phone Number: 03/02/2023, 1:26 PM  Clinical Narrative:    from home, acute blood loss, iv protonix changed to po, advancing diet.  Poss dc tomorrow.  TOC following.        Expected Discharge Plan and Services                                               Social Determinants of Health (SDOH) Interventions SDOH Screenings   Food Insecurity: No Food Insecurity (02/28/2023)  Housing: Low Risk  (02/28/2023)  Transportation Needs: No Transportation Needs (02/28/2023)  Utilities: Not At Risk (02/28/2023)  Alcohol Screen: Low Risk  (09/05/2022)  Depression (PHQ2-9): Low Risk  (02/08/2023)  Financial Resource Strain: Low Risk  (09/05/2022)  Physical Activity: Inactive (09/05/2022)  Social Connections: Moderately Isolated (09/05/2022)  Stress: No Stress Concern Present (09/05/2022)  Tobacco Use: Low Risk  (03/02/2023)    Readmission Risk Interventions     No data to display

## 2023-03-02 NOTE — Anesthesia Postprocedure Evaluation (Signed)
Anesthesia Post Note  Patient: Samantha [name]  Procedure(s) Performed: ESOPHAGOGASTRODUODENOSCOPY (EGD) WITH PROPOFOL HEMOSTASIS CLIP PLACEMENT HEMOSTASIS CONTROL     Patient location during evaluation: PACU Anesthesia Type: MAC Level of consciousness: awake and alert Pain management: pain level controlled Vital Signs Assessment: post-procedure vital signs reviewed and stable Respiratory status: spontaneous breathing, nonlabored ventilation, respiratory function stable and patient connected to nasal cannula oxygen Cardiovascular status: stable and blood pressure returned to baseline Postop Assessment: no apparent nausea or vomiting Anesthetic complications: no   No notable events documented.  Last Vitals:  Vitals:   03/02/23 0406 03/02/23 0731  BP: (!) 107/56 113/65  Pulse: 71   Resp: 11 17  Temp: 36.4 C 36.5 C  SpO2: 94% 96%    Last Pain:  Vitals:   03/02/23 0731  TempSrc: Oral  PainSc: 0-No pain                 [place] [address] [address]

## 2023-03-02 NOTE — Progress Notes (Signed)
[facility] Progress Note  CC:  UGIB; Cameron's ulcers  Subjective:  Feels good.  Just did not sleep well last night.  No BM or sign of bleeding.  Would like to eat.  Objective:  Vital signs in last 24 hours: Temp:  [97.6 F (36.4 C)-98.5 F (36.9 C)] 97.7 F (36.5 C) (04/18 0731) Pulse Rate:  [71-94] 94 (04/18 0850) Resp:  [10-22] 18 (04/18 0850) BP: (107-141)/(56-103) 118/66 (04/18 0850) SpO2:  [90 %-99 %] 93 % (04/18 0850) Weight:  [67.4 kg] 67.4 kg (04/18 0145) Last BM Date : 03/01/23 General:  Alert, Well-developed, in NAD Heart:  Regular rate and rhythm; no murmurs Pulm:  CTAB.  No W/R/R. Abdomen:  Soft, non-distended.  BS present.  Non-tender. Neurologic:  Alert and oriented x 4;  grossly normal neurologically. Psych:  Alert and cooperative. Normal mood and affect.  Intake/Output from previous day: 04/17 0701 - 04/18 0700 In: 660 [P.O.:360; I.V.:200; IV Piggyback:100] Out: 501 [Urine:201; Emesis/NG output:300] Intake/Output this shift: Total I/O In: 220.5 [P.O.:120; IV Piggyback:100.5] Out: 250 [Urine:250]  Lab Results: Recent Labs    03/01/23 0228 03/01/23 1751 03/02/23 0707  WBC 12.8* 11.2* 8.2  HGB 10.0* 10.5* 8.7*  HCT 28.9* 30.5* 25.9*  PLT 272 258 249   BMET Recent Labs    02/28/23 1705 02/28/23 1723 03/01/23 0228  NA 127* 131*  131* 134*  K 4.1 3.4*  3.4* 3.9  CL 93* 102 105  CO2 17*  --  20*  GLUCOSE 108* 87 97  BUN 90* 92* 83*  CREATININE 5.16* 4.90* 2.98*  CALCIUM 8.1*  --  8.0*   LFT Recent Labs    02/28/23 1705  PROT 5.4*  ALBUMIN 2.9*  AST 23  ALT 18  ALKPHOS 48  BILITOT 0.9   PT/INR Recent Labs    02/28/23 1705  LABPROT 14.2  INR 1.1   CT Head Wo Contrast  Result Date: 02/28/2023 CLINICAL DATA:  Syncope/presyncope, cerebrovascular cause suspected EXAM: CT HEAD WITHOUT CONTRAST TECHNIQUE: Contiguous axial images were obtained from the base of the skull through the vertex without intravenous  contrast. RADIATION DOSE REDUCTION: This exam was performed according to the departmental dose-optimization program which includes automated exposure control, adjustment of the mA and/or kV according to patient size and/or use of iterative reconstruction technique. COMPARISON:  CT head 05/01/2022 FINDINGS: Brain: Post right posterior approach ventricular peritoneal shunt. Similar-appearing right frontal lobe encephalomalacia. No evidence of large-territorial acute infarction. No parenchymal hemorrhage. No mass lesion. No extra-axial collection. No mass effect or midline shift. Persistent stable enlarged lateral and third ventricles. Basilar cisterns are patent. Vascular: No hyperdense vessel. Vascular clipping again noted along the circle-of-Willis on the left. Skull: No acute fracture or focal lesion.  Right frontal craniotomy. Sinuses/Orbits: Paranasal sinuses and mastoid air cells are clear. The orbits are unremarkable. Other: None. IMPRESSION: No acute intracranial abnormality in a patient with chronic stable non-communicating hydrocephalus. Electronically Signed   By: Tish Frederickson M.D.   On: 02/28/2023 18:21   CT CHEST ABDOMEN PELVIS WO CONTRAST  Result Date: 02/28/2023 CLINICAL DATA:  Pt states she has been dizzy x 5-6 days, having nausea x 2 days EXAM: CT CHEST, ABDOMEN AND PELVIS WITHOUT CONTRAST TECHNIQUE: Multidetector CT imaging of the chest, abdomen and pelvis was performed following the standard protocol without IV contrast. RADIATION DOSE REDUCTION: This exam was performed according to the departmental dose-optimization program which includes automated exposure control, adjustment of the mA and/or kV according to  patient size and/or use of iterative reconstruction technique. COMPARISON:  None Available. FINDINGS: CT CHEST FINDINGS Cardiovascular: Normal heart size. No significant pericardial effusion. The thoracic aorta is normal in caliber. No atherosclerotic plaque of the thoracic aorta. No  coronary artery calcifications. Mediastinum/Nodes: No enlarged mediastinal, hilar, or axillary lymph nodes. Thyroid gland, trachea, and esophagus demonstrate no significant findings. Persistent moderate volume hiatal hernia. The esophageal lumen is diffusely distended with fluid. Lungs/Pleura: Peribronchovascular ground-glass airspace opacities. No focal consolidation. No pulmonary nodule. No pulmonary mass. No pleural effusion. No pneumothorax. Musculoskeletal: No chest wall abnormality. No suspicious lytic or blastic osseous lesions. No acute displaced fracture. CT ABDOMEN PELVIS FINDINGS Hepatobiliary: No focal liver abnormality. Status post cholecystectomy. No biliary dilatation. Pancreas: No focal lesion. Normal pancreatic contour. No surrounding inflammatory changes. No main pancreatic ductal dilatation. Spleen: Normal in size without focal abnormality. Adrenals/Urinary Tract: No adrenal nodule bilaterally. No nephrolithiasis and no hydronephrosis. No definite contour-deforming renal mass. No ureterolithiasis or hydroureter. The urinary bladder is unremarkable. Stomach/Bowel: Stomach is distended with ingested material with no definite finding of obstruction. No evidence of bowel wall thickening or dilatation. Appendix appears normal. Vascular/Lymphatic: No abdominal aorta or iliac aneurysm. Mild atherosclerotic plaque of the aorta and its branches. No abdominal, pelvic, or inguinal lymphadenopathy. Reproductive: Masslike uterine fundus suggestive of fibroid (7:94). Otherwise uterus and bilateral adnexa are unremarkable. Other: No intraperitoneal free fluid. No intraperitoneal free gas. No organized fluid collection. Musculoskeletal: No abdominal wall hernia or abnormality. No suspicious lytic or blastic osseous lesions. No acute displaced fracture. IMPRESSION: 1. Peribronchovascular ground-glass airspace opacities. Finding may represent pulmonary edema versus infection/inflammation (aspiration pneumonia). 2.  Persistent moderate volume hiatal hernia with fluid distension of the esophageal lumen. Consider enteric tube placement. 3. Masslike uterine fundus suggestive of fibroid. 4. Otherwise limited evaluation on this noncontrast CT chest, abdomen, pelvis. Electronically Signed   By: Tish Frederickson M.D.   On: 02/28/2023 18:16   DG Chest Port 1 View  Result Date: 02/28/2023 CLINICAL DATA:  Questionable sepsis - evaluate for abnormality EXAM: PORTABLE CHEST 1 VIEW COMPARISON:  Chest x-ray 05/01/2022 FINDINGS: Right neck and chest ventriculoperitoneal shunt catheter with similar irregular contour and likely calcification. Possible persistent catheter discontinuity of the level of the mid right chest. The heart and mediastinal contours are within normal limits. At least moderate volume hiatal hernia overlying the mediastinum. No focal consolidation. No pulmonary edema. No pleural effusion. No pneumothorax. No acute osseous abnormality. IMPRESSION: 1. No active disease. 2. At least moderate volume hiatal hernia. Electronically Signed   By: Tish Frederickson M.D.   On: 02/28/2023 17:53    Assessment / Plan: 57 yo female history of chronic iron deficiency anemia, GERD / hiatal hernia and Cameron's lesions. Admitted with upper GI bleed / hypotension. Bleeding probably secondary to known Cameron's lesions.  EGD with 4 cm hiatal and Cameron's ulcers to which clips and gel foam were applied.   Acute on chronic anemia.  -presenting hgb 8.2 ( down from 12.9 baseline).  -hgb improved to 10 with just one unit of blood   -Hgb today, 4/18, is back down to 8.7 grams, but no sign of bleeding   AKi .  Cr 5.1. Cr improved to 2.98 with IVF, labs pending for today.   Possible aspiration PNA. On Unasyn   Chronic constipation Manages with Linzess.   OSA on CPAP   Hydrocephalus s/p VP shunt   -Will need to see surgery as outpatient for hiatal hernia repair (urgent outpatient referral). -Will allow  soft diet  today. -Check Hgb in AM and if stable then likely ok for discharge on BID PPI vs complete full 72 hours of PPI gtt (tomorrow afternoon will be 48 hours of gtt).    LOS: 2 days   Princella Pellegrini. Delinda Malan  03/02/2023, 10:15 AM

## 2023-03-02 NOTE — Care Management Important Message (Signed)
Important Message  Patient Details  Name: Samantha [name] MRN: [id number] Date of Birth: May 22, 1966   Medicare Important Message Given:  Yes     Dorena Bodo 03/02/2023, 2:01 PM

## 2023-03-02 NOTE — Progress Notes (Signed)
PROGRESS NOTE    Samantha [name]  ZOX:[id number] DOB: 03/14/1966 DOA: 02/28/2023 PCP: Tresa Garter, MD    Brief Narrative:  Samantha [name] is a 57 y.o. female with medical history of hydrocephalus s/p VP shunt, CKD stage IIIa, HTN, hyperlipidemia, facial twitching suspected due to partial complex seizure on Lamictal, iron deficiency anemia, chronic pain on morphine, depression/anxiety, OSA on CPAP presented to the ED for evaluation of dizziness and nausea with complaints of black stools for 2 weeks.  In the ED, patient was noted to have a positive FOBT and creatinine of 5.1, baseline creatinine of 1.0 on 09/22/2022.  X-ray was negative for focal consolidation.  CT scan of the chest abdomen and pelvis showed peribronchovascular groundglass opacities.  Patient was given IV fluids and transfused 1 unit of packed RBC and was admitted hospital for further evaluation and treatment.  Assessment and Plan:  Acute blood loss anemia secondary to upper GI bleed with hiatal hernia with Sheria Lang ulcerations.  fluid distention of the esophageal lumen: Hemoglobin on presentation was 6.5 (hemoglobin of 12.9 in November 2023).  FOBT was positive.  Due to fluid in esophagus, NG tube was placed.  He was started on IV Protonix and transfused 1 unit of packed RBC.  GI was consulted and patient underwent endoscopic Evaluation with findings of 4 cm hiatal hernia with multiple Cameron ulcers. The linear ulcers had adherent clot which was lavaged and cleared. The largest linear ulcer contained flat red spots with oozing.  Currently on IV Protonix drip, GI following.  Diet has been advanced to soft diet at this time.  Patient will likely benefit from definitive management of her hiatal hernia by surgery as outpatient due to recurrent bleeding.  Acute renal failure:  Patient presented with creatinine of 5.1 but had baseline creatinine of 1.0, 5 months back.  This was thought to be likely secondary to hypotension and  anemia.    CT without evidence of urinary obstruction or retention.  Received some IV fluids and creatinine improved has improved to 2.9 on 03/01/2023.  BMP from today pending.  Continue IV fluids for 1 more day.  Check BMP in AM..     Acute hypoxic respiratory failure secondary to aspiration pneumonia: On IV Unasyn.  Patient is not on supplemental oxygen at this time.   Hyponatremia: Improved after IV fluids.  Recent sodium level of 134.   History of hypertension presented with hypotension: Hypotension secondary to GI bleed.  Improved with IV fluids.  Holding antihypertensives at this time.    Chronic pain: Resume chronic narcotics from home including morphine and oxycodone.    Hyperlipidemia: Continue statins.   Depression/anxiety: Continue buspirone, Cymbalta, gabapentin,   History of facial twitching/partial complex seizure: Continue Lamictal.   Chronic hydrocephalus s/p VP shunt: Currently stable.  CT head scan was stable.  Memory issues: Continue Namenda and Aricept.   OSA on CPAP:    DVT prophylaxis: SCDs Start: 02/28/23 2015   Code Status:     Code Status: Full Code  Disposition: home in 1 to 2 days when okay with GI.  Status is: Inpatient  Remains inpatient appropriate because: GI bleed, IV Protonix, closer monitoring,   Family Communication: None at bedside  Consultants:  GI  Procedures:  EGD on 03/01/2023  Antimicrobials:  Unasyn IV  Anti-infectives (From admission, onward)    Start     Dose/Rate Route Frequency Ordered Stop   03/01/23 2045  Ampicillin-Sulbactam (UNASYN) 3 g in sodium chloride 0.9 % 100  mL IVPB        3 g 200 mL/hr over 30 Minutes Intravenous Every 12 hours 03/01/23 2039     03/01/23 0600  Ampicillin-Sulbactam (UNASYN) 3 g in sodium chloride 0.9 % 100 mL IVPB  Status:  Discontinued        3 g 200 mL/hr over 30 Minutes Intravenous Daily 02/28/23 2017 03/01/23 2039   02/28/23 1830  Ampicillin-Sulbactam (UNASYN) 3 g in sodium  chloride 0.9 % 100 mL IVPB        3 g 200 mL/hr over 30 Minutes Intravenous  Once 02/28/23 1820 02/28/23 1913       Subjective: Today, patient was seen and examined at bedside.  Patient denies any nausea, vomiting fever, chills or rigor.  Has not had bowel movement since yesterday.  Objective: Vitals:   03/02/23 0406 03/02/23 0731 03/02/23 0850 03/02/23 1114  BP: (!) 107/56 113/65 118/66 107/65  Pulse: 71  94 78  Resp: Temp: 97.6 F (36.4 C) 97.7 F (36.5 C)  97.8 F (36.6 C)  TempSrc: Oral Oral  Oral  SpO2: 94% 96% 93% 92%  Weight:      Height:        Intake/Output Summary (Last 24 hours) at 03/02/2023 1250 Last data filed at 03/02/2023 0941 Gross per 24 hour  Intake 880.48 ml  Output 451 ml  Net 429.48 ml   Filed Weights   02/28/23 1947 03/02/23 0145  Weight: 54.4 kg 67.4 kg    Physical Examination: Body mass index is 31.04 kg/m.   General: Obese built, not in obvious distress HENT:   Mild pallor noted oral mucosa is moist.  Chest:  Clear breath sounds.  Diminished breath sounds bilaterally. No crackles or wheezes.  CVS: S1 &S2 heard. No murmur.  Regular rate and rhythm. Abdomen: Soft, nontender, nondistended.  Bowel sounds are heard.   Extremities: No cyanosis, clubbing or edema.  Peripheral pulses are palpable. Psych: Alert, awake and oriented, normal mood CNS:  No cranial nerve deficits.  Power equal in all extremities.   Skin: Warm and dry.  No rashes noted.  Data Reviewed:   CBC: Recent Labs  Lab 02/28/23 1705 02/28/23 1723 03/01/23 0228 03/01/23 1751 03/02/23 0707  WBC 15.8*  --  12.8* 11.2* 8.2  NEUTROABS 13.7*  --   --  9.1* 5.1  HGB 8.2* 6.5*  6.8* 10.0* 10.5* 8.7*  HCT 24.1* 19.0*  20.0* 28.9* 30.5* 25.9*  MCV 97.6  --  94.1 94.1 96.6  PLT 341  --  272 258 249    Basic Metabolic Panel: Recent Labs  Lab 02/28/23 1705 02/28/23 1723 03/01/23 0228  NA 127* 131*  131* 134*  K 4.1 3.4*  3.4* 3.9  CL 93* 102 105  CO2  17*  --  20*  GLUCOSE 108* 87 97  BUN 90* 92* 83*  CREATININE 5.16* 4.90* 2.98*  CALCIUM 8.1*  --  8.0*  MG 1.8  --   --     Liver Function Tests: Recent Labs  Lab 02/28/23 1705  AST 23  ALT 18  ALKPHOS 48  BILITOT 0.9  PROT 5.4*  ALBUMIN 2.9*     Radiology Studies: CT Head Wo Contrast  Result Date: 02/28/2023 CLINICAL DATA:  Syncope/presyncope, cerebrovascular cause suspected EXAM: CT HEAD WITHOUT CONTRAST TECHNIQUE: Contiguous axial images were obtained from the base of the skull through the vertex without intravenous contrast. RADIATION DOSE REDUCTION: This exam was performed according to the departmental  dose-optimization program which includes automated exposure control, adjustment of the mA and/or kV according to patient size and/or use of iterative reconstruction technique. COMPARISON:  CT head 05/01/2022 FINDINGS: Brain: Post right posterior approach ventricular peritoneal shunt. Similar-appearing right frontal lobe encephalomalacia. No evidence of large-territorial acute infarction. No parenchymal hemorrhage. No mass lesion. No extra-axial collection. No mass effect or midline shift. Persistent stable enlarged lateral and third ventricles. Basilar cisterns are patent. Vascular: No hyperdense vessel. Vascular clipping again noted along the circle-of-Willis on the left. Skull: No acute fracture or focal lesion.  Right frontal craniotomy. Sinuses/Orbits: Paranasal sinuses and mastoid air cells are clear. The orbits are unremarkable. Other: None. IMPRESSION: No acute intracranial abnormality in a patient with chronic stable non-communicating hydrocephalus. Electronically Signed   By: Tish Frederickson M.D.   On: 02/28/2023 18:21   CT CHEST ABDOMEN PELVIS WO CONTRAST  Result Date: 02/28/2023 CLINICAL DATA:  Pt states she has been dizzy x 5-6 days, having nausea x 2 days EXAM: CT CHEST, ABDOMEN AND PELVIS WITHOUT CONTRAST TECHNIQUE: Multidetector CT imaging of the chest, abdomen and  pelvis was performed following the standard protocol without IV contrast. RADIATION DOSE REDUCTION: This exam was performed according to the departmental dose-optimization program which includes automated exposure control, adjustment of the mA and/or kV according to patient size and/or use of iterative reconstruction technique. COMPARISON:  None Available. FINDINGS: CT CHEST FINDINGS Cardiovascular: Normal heart size. No significant pericardial effusion. The thoracic aorta is normal in caliber. No atherosclerotic plaque of the thoracic aorta. No coronary artery calcifications. Mediastinum/Nodes: No enlarged mediastinal, hilar, or axillary lymph nodes. Thyroid gland, trachea, and esophagus demonstrate no significant findings. Persistent moderate volume hiatal hernia. The esophageal lumen is diffusely distended with fluid. Lungs/Pleura: Peribronchovascular ground-glass airspace opacities. No focal consolidation. No pulmonary nodule. No pulmonary mass. No pleural effusion. No pneumothorax. Musculoskeletal: No chest wall abnormality. No suspicious lytic or blastic osseous lesions. No acute displaced fracture. CT ABDOMEN PELVIS FINDINGS Hepatobiliary: No focal liver abnormality. Status post cholecystectomy. No biliary dilatation. Pancreas: No focal lesion. Normal pancreatic contour. No surrounding inflammatory changes. No main pancreatic ductal dilatation. Spleen: Normal in size without focal abnormality. Adrenals/Urinary Tract: No adrenal nodule bilaterally. No nephrolithiasis and no hydronephrosis. No definite contour-deforming renal mass. No ureterolithiasis or hydroureter. The urinary bladder is unremarkable. Stomach/Bowel: Stomach is distended with ingested material with no definite finding of obstruction. No evidence of bowel wall thickening or dilatation. Appendix appears normal. Vascular/Lymphatic: No abdominal aorta or iliac aneurysm. Mild atherosclerotic plaque of the aorta and its branches. No abdominal,  pelvic, or inguinal lymphadenopathy. Reproductive: Masslike uterine fundus suggestive of fibroid (7:94). Otherwise uterus and bilateral adnexa are unremarkable. Other: No intraperitoneal free fluid. No intraperitoneal free gas. No organized fluid collection. Musculoskeletal: No abdominal wall hernia or abnormality. No suspicious lytic or blastic osseous lesions. No acute displaced fracture. IMPRESSION: 1. Peribronchovascular ground-glass airspace opacities. Finding may represent pulmonary edema versus infection/inflammation (aspiration pneumonia). 2. Persistent moderate volume hiatal hernia with fluid distension of the esophageal lumen. Consider enteric tube placement. 3. Masslike uterine fundus suggestive of fibroid. 4. Otherwise limited evaluation on this noncontrast CT chest, abdomen, pelvis. Electronically Signed   By: Tish Frederickson M.D.   On: 02/28/2023 18:16   DG Chest Port 1 View  Result Date: 02/28/2023 CLINICAL DATA:  Questionable sepsis - evaluate for abnormality EXAM: PORTABLE CHEST 1 VIEW COMPARISON:  Chest x-ray 05/01/2022 FINDINGS: Right neck and chest ventriculoperitoneal shunt catheter with similar irregular contour and likely calcification. Possible persistent catheter  discontinuity of the level of the mid right chest. The heart and mediastinal contours are within normal limits. At least moderate volume hiatal hernia overlying the mediastinum. No focal consolidation. No pulmonary edema. No pleural effusion. No pneumothorax. No acute osseous abnormality. IMPRESSION: 1. No active disease. 2. At least moderate volume hiatal hernia. Electronically Signed   By: Tish Frederickson M.D.   On: 02/28/2023 17:53      LOS: 2 days     Joycelyn Das, MD [facility] Available via Epic secure chat 7am-7pm After these hours, please refer to coverage provider listed on [email or web address] 03/02/2023, 12:50 PM

## 2023-03-03 ENCOUNTER — Telehealth: Payer: Self-pay

## 2023-03-03 DIAGNOSIS — D62 Acute posthemorrhagic anemia: Secondary | ICD-10-CM | POA: Diagnosis not present

## 2023-03-03 DIAGNOSIS — N179 Acute kidney failure, unspecified: Secondary | ICD-10-CM | POA: Diagnosis not present

## 2023-03-03 DIAGNOSIS — D509 Iron deficiency anemia, unspecified: Secondary | ICD-10-CM

## 2023-03-03 DIAGNOSIS — J9601 Acute respiratory failure with hypoxia: Secondary | ICD-10-CM | POA: Diagnosis not present

## 2023-03-03 DIAGNOSIS — E871 Hypo-osmolality and hyponatremia: Secondary | ICD-10-CM | POA: Diagnosis not present

## 2023-03-03 DIAGNOSIS — D649 Anemia, unspecified: Secondary | ICD-10-CM

## 2023-03-03 DIAGNOSIS — K449 Diaphragmatic hernia without obstruction or gangrene: Secondary | ICD-10-CM

## 2023-03-03 DIAGNOSIS — K253 Acute gastric ulcer without hemorrhage or perforation: Secondary | ICD-10-CM

## 2023-03-03 DIAGNOSIS — K250 Acute gastric ulcer with hemorrhage: Secondary | ICD-10-CM | POA: Diagnosis not present

## 2023-03-03 DIAGNOSIS — J690 Pneumonitis due to inhalation of food and vomit: Secondary | ICD-10-CM

## 2023-03-03 LAB — BASIC METABOLIC PANEL
Anion gap: 7 (ref 5–15)
BUN: 12 mg/dL (ref 6–20)
CO2: 24 mmol/L (ref 22–32)
Calcium: 7.9 mg/dL — ABNORMAL LOW (ref 8.9–10.3)
Chloride: 106 mmol/L (ref 98–111)
Creatinine, Ser: 0.78 mg/dL (ref 0.44–1.00)
GFR, Estimated: >60 mL/min (ref >60)
Glucose, Bld: 119 mg/dL — ABNORMAL HIGH (ref 70–99)
Potassium: 3.9 mmol/L (ref 3.5–5.1)
Sodium: 137 mmol/L (ref 135–145)

## 2023-03-03 LAB — CBC
HCT: 24.5 % — ABNORMAL LOW (ref 36.0–46.0)
Hemoglobin: 8.7 g/dL — ABNORMAL LOW (ref 12.0–15.0)
MCH: 34.1 pg — ABNORMAL HIGH (ref 26.0–34.0)
MCHC: 35.5 g/dL (ref 30.0–36.0)
MCV: 96.1 fL (ref 80.0–100.0)
Platelets: 180 10*3/uL (ref 150–400)
RBC: 2.55 MIL/uL — ABNORMAL LOW (ref 3.87–5.11)
RDW: 13.8 % (ref 11.5–15.5)
WBC: 7.2 10*3/uL (ref 4.0–10.5)
nRBC: 0.3 % — ABNORMAL HIGH (ref 0.0–0.2)

## 2023-03-03 LAB — MAGNESIUM: Magnesium: 1.6 mg/dL — ABNORMAL LOW (ref 1.7–2.4)

## 2023-03-03 MED ORDER — SUCRALFATE 1 GM/10ML PO SUSP: 1.0000 g | Freq: Four times a day (QID) | ORAL | 0 refills | Status: DC

## 2023-03-03 MED ORDER — PANTOPRAZOLE SODIUM 40 MG PO TBEC: 40.0000 mg | DELAYED_RELEASE_TABLET | Freq: Two times a day (BID) | ORAL | 2 refills | Status: DC

## 2023-03-03 NOTE — Progress Notes (Signed)
      Progress Note   Subjective  Patient doing well this AM. Eating breakfast upon evaluation. NO pain. NO recurrent bleeding symptoms. Hgb stable, BUN normal.   Objective   Vital signs in last 24 hours: Temp:  [97.7 F (36.5 C)-98.7 F (37.1 C)] 98.2 F (36.8 C) (04/19 0739) Pulse Rate:  [58-94] 58 (04/19 0739) Resp:  [14-21] 21 (04/19 0739) BP: (107-167)/(65-96) 167/80 (04/19 0739) SpO2:  [92 %-99 %] 96 % (04/19 0739) Weight:  [69 kg] 69 kg (04/19 0337) Last BM Date : 03/01/23 General:    white female in NAD Neurologic:  Alert and oriented,  grossly normal neurologically. Psych:  Cooperative. Normal mood and affect.  Intake/Output from previous day: 04/18 0701 - 04/19 0700 In: 2255.4 [P.O.:1020; I.V.:1034.8; IV Piggyback:200.6] Out: 550 [Urine:550] Intake/Output this shift: No intake/output data recorded.  Lab Results: Recent Labs    03/01/23 1751 03/02/23 0707 03/03/23 0016  WBC 11.2* 8.2 7.2  HGB 10.5* 8.7* 8.7*  HCT 30.5* 25.9* 24.5*  PLT 258 249 180   BMET Recent Labs    03/01/23 0228 03/02/23 1023 03/03/23 0016  NA 134* 137 137  K 3.9 3.9 3.9  CL 105 106 106  CO2 20* 24 24  GLUCOSE 97 88 119*  BUN 83* 18 12  CREATININE 2.98* 0.87 0.78  CALCIUM 8.0* 8.2* 7.9*   LFT Recent Labs    02/28/23 1705  PROT 5.4*  ALBUMIN 2.9*  AST 23  ALT 18  ALKPHOS 48  BILITOT 0.9   PT/INR Recent Labs    02/28/23 1705  LABPROT 14.2  INR 1.1    Studies/Results: No results found.     Assessment / Plan:    57 y/o female here with the following:  Upper GI bleed secondary to [facility] lesions within hiatal hernia Anemia  Doing well on IV PPI post endoscopic therapy. No further bleeding symptoms. Hgb is stable, BUN has normalized - she has stopped bleeding. OKay to transition to oral protonix  BID today, can also add liquid carafate 10cc every 6 hours for a few days to help mucosal healing. Avoid all NSAIDs. She will have a surgical consult to get  hiatal hernia repaired as outpatient. Okay for discharge from bleeding perspective if stable from her other issues. She should come to our office for hemoglobin draw early next week to make sure stable. She agrees.  PLAN: - transition to protonix  BID indefinitely until hiatal hernia repair - start liquid carafate 10cc every 6 hours for next 5 days - this may make her stools dark, counseled her on this - avoid NSAIDs - outpatient referral for hiatal hernia repair - consult to [facility] for this - follow up for lab draw at our office early next week - our office will coordinate.  We will sign off for now, please call with questions in the interim.  Harlin Rain, MD [facility]

## 2023-03-03 NOTE — Telephone Encounter (Signed)
Patient called into the office this afternoon wanting to be seen next week. Pt has been scheduled for a f/u with Dr. Rhea Belton on 03/14/23 at 4 pm. We will contact patient next week for repeat labs.

## 2023-03-03 NOTE — Telephone Encounter (Signed)
Lab order in epic. Lab reminder sent to Kerrie Buffalo, RN early next week.  Urgent referral, records, pt's demographic, and insurance information have been faxed to [facility] at [phone].

## 2023-03-03 NOTE — Discharge Summary (Signed)
Physician Discharge Summary  Samantha [name] ZOX:[id number] DOB: [birth year] DOA: 02/28/2023  PCP: Tresa Garter, MD  Admit date: 02/28/2023 Discharge date: 03/03/2023  Admitted From: Home  Discharge disposition: home    Recommendations for Outpatient Follow-Up:   Check CBC, BMP, magnesium in the next visit Follow-up with GI as outpatient as scheduled by the clinic for blood work and referral to general surgery for hiatal hernia   Discharge Diagnosis:   Principal Problem:   Acute blood loss anemia Active Problems:   Acute renal failure superimposed on stage 3a chronic kidney disease   Aspiration pneumonia   Acute respiratory failure with hypoxia   Hyponatremia   Chronic pain   Adjustment disorder with mixed anxiety and depressed mood   OSA on CPAP   Presence of cerebrospinal fluid drainage device   Memory loss   Hemorrhage due to acute Cameron lesion   Hypotension   Hiatal hernia   Discharge Condition: Improved.  Diet recommendation: Low sodium, heart healthy.    Wound care: None.  Code status: Full.   History of Present Illness:   Samantha [name] is a 57 y.o. female with medical history of hydrocephalus s/p VP shunt, CKD stage IIIa, HTN, hyperlipidemia, facial twitching suspected due to partial complex seizure on Lamictal, iron deficiency anemia, chronic pain on morphine, depression/anxiety, OSA on CPAP presented to the ED for evaluation of dizziness and nausea with complaints of black stools for 2 weeks.  In the ED, patient was noted to have a positive FOBT and creatinine of 5.1, baseline creatinine of 1.0 on 09/22/2022.  X-ray was negative for focal consolidation.  CT scan of the chest abdomen and pelvis showed peribronchovascular groundglass opacities.  Patient was given IV fluids and transfused 1 unit of packed RBC and was admitted hospital for further evaluation and treatment.   Hospital Course:   Following conditions were addressed during  hospitalization as listed below,  Acute blood loss anemia secondary to upper GI bleed with hiatal hernia with Sheria Lang ulcerations.  fluid distention of the esophageal lumen: Hemoglobin on presentation was 6.5 (hemoglobin of 12.9 in November 2023).  FOBT was positive.  Due to fluid in esophagus, NG tube was placed.  He was started on IV Protonix and transfused 1 unit of packed RBC.  GI was consulted and patient underwent endoscopic evaluation with findings of 4 cm hiatal hernia with multiple Cameron ulcers. The linear ulcers had adherent clot which was lavaged and cleared. The largest linear ulcer contained flat red spots with oozing.  Patient was then continued on IV Protonix drip, GI the patient and subsequently diet was advanced.  GI has cleared the patient for discharge home with PPI twice daily and sucralfate with outpatient GI follow-up for blood work and referral to general surgery for hiatal hernia surgery.   Acute renal failure:  Patient presented with creatinine of 5.1 but had baseline creatinine of 1.0, 5 months back.  This was thought to be likely secondary to hypotension and anemia.    CT without evidence of urinary obstruction or retention.  Received IV fluids and creatinine improved has improved and has normalized at this time.  Creatinine today at 0.7.   Acute hypoxic respiratory failure secondary to aspiration pneumonia: Patient received IV Unasyn during hospitalization.  At this time clinically significantly improved not on supplemental oxygen.  Will discontinue antibiotic on discharge.  Hyponatremia: Improved after IV fluids.  Latest sodium level of 137   History of hypertension presented with hypotension: Hypotension secondary  to GI bleed.   Improved with IV fluids.  Blood pressure has improved at this time.   Chronic pain: On chronic narcotics including morphine and oxycodone.  Will be resumed on discharge.   Hyperlipidemia: Continue statins.    Depression/anxiety: Continue buspirone, Cymbalta, gabapentin,   History of facial twitching/partial complex seizure: Continue Lamictal.   Chronic hydrocephalus s/p VP shunt: Currently stable.  CT head scan was stable.   Memory issues: Continue Namenda and Aricept.   OSA on CPAP: Continue on discharge  Disposition.  At this time, patient is stable for disposition home with outpatient GI and PCP follow-up.  Medical Consultants:   GI  Procedures:    EGD on 03/01/2023  Subjective:   Today, patient was seen and examined at bedside.  Denies any nausea vomiting fever chills or rigor.  Wants to go home.  Seen by GI and okay for discharge today.  Discharge Exam:   Vitals:   03/03/23 0337 03/03/23 0739  BP: (!) 149/96 (!) 167/80  Pulse: 72 (!) 58  Resp: 16 (!) 21  Temp: 98.2 F (36.8 C) 98.2 F (36.8 C)  SpO2: 96% 96%   Vitals:   03/02/23 2012 03/02/23 2320 03/03/23 0337 03/03/23 0739  BP: 128/71 132/75 (!) 149/96 (!) 167/80  Pulse:  71 72 (!) 58  Resp: 17 14 16  (!) 21  Temp: 98.2 F (36.8 C) 98.7 F (37.1 C) 98.2 F (36.8 C) 98.2 F (36.8 C)  TempSrc: Oral Oral Oral Oral  SpO2: 99% 97% 96% 96%  Weight:   69 kg   Height:       Body mass index is 31.79 kg/m.  General: Alert awake, not in obvious distress, obese built HENT: pupils equally reacting to light, mild pallor noted.  Oral mucosa is moist.  Chest:  Clear breath sounds.  Diminished breath sounds bilaterally. No crackles or wheezes.  CVS: S1 &S2 heard. No murmur.  Regular rate and rhythm. Abdomen: Soft, nontender, nondistended.  Bowel sounds are heard.   Extremities: No cyanosis, clubbing or edema.  Peripheral pulses are palpable. Psych: Alert, awake and oriented, normal mood CNS:  No cranial nerve deficits.  Power equal in all extremities.   Skin: Warm and dry.  No rashes noted.  The results of significant diagnostics from this hospitalization (including imaging, microbiology, ancillary and laboratory)  are listed below for reference.     Diagnostic Studies:   CT Head Wo Contrast  Result Date: 02/28/2023 CLINICAL DATA:  Syncope/presyncope, cerebrovascular cause suspected EXAM: CT HEAD WITHOUT CONTRAST TECHNIQUE: Contiguous axial images were obtained from the base of the skull through the vertex without intravenous contrast. RADIATION DOSE REDUCTION: This exam was performed according to the departmental dose-optimization program which includes automated exposure control, adjustment of the mA and/or kV according to patient size and/or use of iterative reconstruction technique. COMPARISON:  CT head 05/01/2022 FINDINGS: Brain: Post right posterior approach ventricular peritoneal shunt. Similar-appearing right frontal lobe encephalomalacia. No evidence of large-territorial acute infarction. No parenchymal hemorrhage. No mass lesion. No extra-axial collection. No mass effect or midline shift. Persistent stable enlarged lateral and third ventricles. Basilar cisterns are patent. Vascular: No hyperdense vessel. Vascular clipping again noted along the circle-of-Willis on the left. Skull: No acute fracture or focal lesion.  Right frontal craniotomy. Sinuses/Orbits: Paranasal sinuses and mastoid air cells are clear. The orbits are unremarkable. Other: None. IMPRESSION: No acute intracranial abnormality in a patient with chronic stable non-communicating hydrocephalus. Electronically Signed   By: Normajean Glasgow.D.  On: 02/28/2023 18:21   CT CHEST ABDOMEN PELVIS WO CONTRAST  Result Date: 02/28/2023 CLINICAL DATA:  Pt states she has been dizzy x 5-6 days, having nausea x 2 days EXAM: CT CHEST, ABDOMEN AND PELVIS WITHOUT CONTRAST TECHNIQUE: Multidetector CT imaging of the chest, abdomen and pelvis was performed following the standard protocol without IV contrast. RADIATION DOSE REDUCTION: This exam was performed according to the departmental dose-optimization program which includes automated exposure control,  adjustment of the mA and/or kV according to patient size and/or use of iterative reconstruction technique. COMPARISON:  None Available. FINDINGS: CT CHEST FINDINGS Cardiovascular: Normal heart size. No significant pericardial effusion. The thoracic aorta is normal in caliber. No atherosclerotic plaque of the thoracic aorta. No coronary artery calcifications. Mediastinum/Nodes: No enlarged mediastinal, hilar, or axillary lymph nodes. Thyroid gland, trachea, and esophagus demonstrate no significant findings. Persistent moderate volume hiatal hernia. The esophageal lumen is diffusely distended with fluid. Lungs/Pleura: Peribronchovascular ground-glass airspace opacities. No focal consolidation. No pulmonary nodule. No pulmonary mass. No pleural effusion. No pneumothorax. Musculoskeletal: No chest wall abnormality. No suspicious lytic or blastic osseous lesions. No acute displaced fracture. CT ABDOMEN PELVIS FINDINGS Hepatobiliary: No focal liver abnormality. Status post cholecystectomy. No biliary dilatation. Pancreas: No focal lesion. Normal pancreatic contour. No surrounding inflammatory changes. No main pancreatic ductal dilatation. Spleen: Normal in size without focal abnormality. Adrenals/Urinary Tract: No adrenal nodule bilaterally. No nephrolithiasis and no hydronephrosis. No definite contour-deforming renal mass. No ureterolithiasis or hydroureter. The urinary bladder is unremarkable. Stomach/Bowel: Stomach is distended with ingested material with no definite finding of obstruction. No evidence of bowel wall thickening or dilatation. Appendix appears normal. Vascular/Lymphatic: No abdominal aorta or iliac aneurysm. Mild atherosclerotic plaque of the aorta and its branches. No abdominal, pelvic, or inguinal lymphadenopathy. Reproductive: Masslike uterine fundus suggestive of fibroid (7:94). Otherwise uterus and bilateral adnexa are unremarkable. Other: No intraperitoneal free fluid. No intraperitoneal free gas.  No organized fluid collection. Musculoskeletal: No abdominal wall hernia or abnormality. No suspicious lytic or blastic osseous lesions. No acute displaced fracture. IMPRESSION: 1. Peribronchovascular ground-glass airspace opacities. Finding may represent pulmonary edema versus infection/inflammation (aspiration pneumonia). 2. Persistent moderate volume hiatal hernia with fluid distension of the esophageal lumen. Consider enteric tube placement. 3. Masslike uterine fundus suggestive of fibroid. 4. Otherwise limited evaluation on this noncontrast CT chest, abdomen, pelvis. Electronically Signed   By: Tish Frederickson M.D.   On: 02/28/2023 18:16   DG Chest Port 1 View  Result Date: 02/28/2023 CLINICAL DATA:  Questionable sepsis - evaluate for abnormality EXAM: PORTABLE CHEST 1 VIEW COMPARISON:  Chest x-ray 05/01/2022 FINDINGS: Right neck and chest ventriculoperitoneal shunt catheter with similar irregular contour and likely calcification. Possible persistent catheter discontinuity of the level of the mid right chest. The heart and mediastinal contours are within normal limits. At least moderate volume hiatal hernia overlying the mediastinum. No focal consolidation. No pulmonary edema. No pleural effusion. No pneumothorax. No acute osseous abnormality. IMPRESSION: 1. No active disease. 2. At least moderate volume hiatal hernia. Electronically Signed   By: Tish Frederickson M.D.   On: 02/28/2023 17:53     Labs:   Basic Metabolic Panel: Recent Labs  Lab 02/28/23 1705 02/28/23 1723 03/01/23 0228 03/02/23 1023 03/03/23 0016  NA 127* 131*  131* 134* 137 137  K 4.1 3.4*  3.4* 3.9 3.9 3.9  CL 93* 102 105 106 106  CO2 17*  --  20* 24 24  GLUCOSE 108* 87 97 88 119*  BUN 90* 92* 83*  18 12  CREATININE 5.16* 4.90* 2.98* 0.87 0.78  CALCIUM 8.1*  --  8.0* 8.2* 7.9*  MG 1.8  --   --   --  1.6*   GFR Estimated Creatinine Clearance: 63 mL/min (by C-G formula based on SCr of 0.78 mg/dL). Liver Function  Tests: Recent Labs  Lab 02/28/23 1705  AST 23  ALT 18  ALKPHOS 48  BILITOT 0.9  PROT 5.4*  ALBUMIN 2.9*   Recent Labs  Lab 02/28/23 1705  LIPASE 30   No results for input(s): "AMMONIA" in the last 168 hours. Coagulation profile Recent Labs  Lab 02/28/23 1705  INR 1.1    CBC: Recent Labs  Lab 02/28/23 1705 02/28/23 1723 03/01/23 0228 03/01/23 1751 03/02/23 0707 03/03/23 0016  WBC 15.8*  --  12.8* 11.2* 8.2 7.2  NEUTROABS 13.7*  --   --  9.1* 5.1  --   HGB 8.2* 6.5*  6.8* 10.0* 10.5* 8.7* 8.7*  HCT 24.1* 19.0*  20.0* 28.9* 30.5* 25.9* 24.5*  MCV 97.6  --  94.1 94.1 96.6 96.1  PLT 341  --  272 258 249 180   Cardiac Enzymes: No results for input(s): "CKTOTAL", "CKMB", "CKMBINDEX", "TROPONINI" in the last 168 hours. BNP: Invalid input(s): "POCBNP" CBG: Recent Labs  Lab 02/28/23 1657  GLUCAP 109*   D-Dimer No results for input(s): "DDIMER" in the last 72 hours. Hgb A1c No results for input(s): "HGBA1C" in the last 72 hours. Lipid Profile No results for input(s): "CHOL", "HDL", "LDLCALC", "TRIG", "CHOLHDL", "LDLDIRECT" in the last 72 hours. Thyroid function studies No results for input(s): "TSH", "T4TOTAL", "T3FREE", "THYROIDAB" in the last 72 hours.  Invalid input(s): "FREET3" Anemia work up No results for input(s): "VITAMINB12", "FOLATE", "FERRITIN", "TIBC", "IRON", "RETICCTPCT" in the last 72 hours. Microbiology Recent Results (from the past 240 hour(s))  Resp panel by RT-PCR (RSV, Flu A&B, Covid) Anterior Nasal Swab     Status: None   Collection Time: 02/28/23  5:05 PM   Specimen: Anterior Nasal Swab  Result Value Ref Range Status   SARS Coronavirus 2 by RT PCR NEGATIVE NEGATIVE Final   Influenza A by PCR NEGATIVE NEGATIVE Final   Influenza B by PCR NEGATIVE NEGATIVE Final    Comment: (NOTE) The Xpert Xpress SARS-CoV-2/FLU/RSV plus assay is intended as an aid in the diagnosis of influenza from Nasopharyngeal swab specimens and should not be  used as a sole basis for treatment. Nasal washings and aspirates are unacceptable for Xpert Xpress SARS-CoV-2/FLU/RSV testing.  Fact Sheet for Patients: BloggerCourse.com  Fact Sheet for Healthcare Providers: SeriousBroker.it  This test is not yet approved or cleared by the [place] FDA and has been authorized for detection and/or diagnosis of SARS-CoV-2 by FDA under an Emergency Use Authorization (EUA). This EUA will remain in effect (meaning this test can be used) for the duration of the COVID-19 declaration under Section 564(b)(1) of the Act, 21 U.S.C. section 360bbb-3(b)(1), unless the authorization is terminated or revoked.     Resp Syncytial Virus by PCR NEGATIVE NEGATIVE Final    Comment: (NOTE) Fact Sheet for Patients: BloggerCourse.com  Fact Sheet for Healthcare Providers: SeriousBroker.it  This test is not yet approved or cleared by the [place] FDA and has been authorized for detection and/or diagnosis of SARS-CoV-2 by FDA under an Emergency Use Authorization (EUA). This EUA will remain in effect (meaning this test can be used) for the duration of the COVID-19 declaration under Section 564(b)(1) of the Act, 21 U.S.C. section 360bbb-3(b)(1), unless  the authorization is terminated or revoked.  Performed at [facility], [address], [place], [place] [address]   Blood Culture (routine x 2)     Status: None (Preliminary result)   Collection Time: 02/28/23  5:10 PM   Specimen: BLOOD  Result Value Ref Range Status   Specimen Description BLOOD BLOOD RIGHT ARM  Final   Special Requests   Final    BOTTLES DRAWN AEROBIC AND ANAEROBIC Blood Culture results may not be optimal due to an excessive volume of blood received in culture bottles   Culture   Final    NO GROWTH 3 DAYS Performed at [facility], [address], [place], [place] [address]     Report Status PENDING  Incomplete  Blood Culture (routine x 2)     Status: None (Preliminary result)   Collection Time: 02/28/23  5:18 PM   Specimen: BLOOD LEFT HAND  Result Value Ref Range Status   Specimen Description BLOOD LEFT HAND  Final   Special Requests   Final    BOTTLES DRAWN AEROBIC ONLY Blood Culture results may not be optimal due to an inadequate volume of blood received in culture bottles   Culture   Final    NO GROWTH 3 DAYS Performed at [facility], [address], [place], [place] [address]    Report Status PENDING  Incomplete  MRSA Next Gen by PCR, Nasal     Status: None   Collection Time: 03/01/23  1:09 PM   Specimen: Nasal Mucosa; Nasal Swab  Result Value Ref Range Status   MRSA by PCR Next Gen NOT DETECTED NOT DETECTED Final    Comment: (NOTE) The GeneXpert MRSA Assay (FDA approved for NASAL specimens only), is one component of a comprehensive MRSA colonization surveillance program. It is not intended to diagnose MRSA infection nor to guide or monitor treatment for MRSA infections. Test performance is not FDA approved in patients less than 26 years old. Performed at [facility], [address] [place], [place] [address]      Discharge Instructions:   Discharge Instructions     Call MD for:  persistant nausea and vomiting   Complete by: As directed    Call MD for:  severe uncontrolled pain   Complete by: As directed    Diet general   Complete by: As directed    Discharge instructions   Complete by: As directed    Follow-up with GI clinic to  arrange for blood work and outpatient surgical referral.   Follow-up with your primary care provider as scheduled by you.  Take medications as prescribed.  Please avoid over-the-counter pain medication including Aleve Motrin, okay to take Tylenol, Seek medical attention for worsening symptoms.   Increase activity slowly   Complete by: As directed       Allergies as of 03/03/2023       Reactions    Alprazolam Other (See Comments)   "Crying all the time"   Citalopram Hydrobromide Other (See Comments)   low libido   Olmesartan Other (See Comments)   "Dizziness,"  per patient        Medication List     TAKE these medications    ACCRUFeR 30 MG Caps Generic drug: Ferric Maltol 1 po bid What changed:  how much to take how to take this when to take this additional instructions   Align 4 MG Caps Take 1 capsule (4 mg total) by mouth daily.  What changed:  when to take this reasons to take this   amLODipine 2.5 MG tablet Commonly known as: NORVASC Take 1 tablet (2.5 mg total) by mouth daily.   Aspercreme Lidocaine Essential 4 % Liqd Generic drug: Lidocaine HCl Apply 1 application  topically 2 (two) times daily as needed (to painful sites).   atorvastatin 20 MG tablet Commonly known as: LIPITOR TAKE 1 TABLET EVERY DAY AT 6 PM What changed: See the new instructions.   busPIRone 10 MG tablet Commonly known as: BUSPAR TAKE 1 TABLET THREE TIMES DAILY   CALCIUM+D3 PO Take 1 tablet by mouth 2 (two) times daily with a meal.   donepezil 10 MG tablet Commonly known as: ARICEPT TAKE 1 TABLET AT BEDTIME What changed:  how much to take how to take this when to take this additional instructions   DULoxetine 60 MG capsule Commonly known as: CYMBALTA TAKE 1 CAPSULE EVERY DAY What changed: when to take this   famotidine 40 MG tablet Commonly known as: PEPCID TAKE 1 TABLET EVERY DAY What changed:  when to take this reasons to take this   gabapentin 600 MG tablet Commonly known as: NEURONTIN Take one tablet at lunch and one at bedtime for 1 week, then one at night once a day What changed:  how much to take how to take this when to take this additional instructions   lactulose 10 GM/15ML solution Commonly known as: CHRONULAC Take 30-45 mLs (20-30 g total) by mouth 2 (two) times daily as needed for moderate constipation or severe constipation.    lamoTRIgine 100 MG tablet Commonly known as: LAMICTAL TAKE 1 TABLET EVERY MORNING, AND 1 AND 1/2 TABLETS AT NIGHT. What changed: See the new instructions.   lidocaine 5 % Commonly known as: Lidoderm Place 1 patch onto the skin daily. Remove & Discard patch within 12 hours or as directed by MD What changed:  when to take this reasons to take this additional instructions   Linzess 290 MCG Caps capsule Generic drug: linaclotide TAKE 1 CAPSULE DAILY BEFORE BREAKFAST What changed: See the new instructions.   memantine 5 MG tablet Commonly known as: NAMENDA TAKE 1 TABLET TWICE DAILY What changed: when to take this   morphine 30 MG 12 hr tablet Commonly known as: MS CONTIN Take 1 tablet (30 mg total) by mouth every 12 (twelve) hours. What changed:  when to take this additional instructions Another medication with the same name was removed. Continue taking this medication, and follow the directions you see here.   Oxycodone HCl 10 MG Tabs Take 1 tablet (10 mg total) by mouth 3 (three) times daily as needed. What changed:  when to take this additional instructions Another medication with the same name was removed. Continue taking this medication, and follow the directions you see here.   pantoprazole 40 MG tablet Commonly known as: Protonix Take 1 tablet (40 mg total) by mouth 2 (two) times daily before a meal. What changed: when to take this   promethazine 12.5 MG tablet Commonly known as: PHENERGAN Take 1 tablet (12.5 mg total) by mouth every 6 (six) hours as needed for nausea or vomiting.   STOOL SOFTENER PO Take 1 capsule by mouth 2 (two) times daily as needed (for constipation).   sucralfate 1 GM/10ML suspension Commonly known as: Carafate Take 10 mLs (1 g total) by mouth 4 (four) times daily for 5 days.        Follow-up Information     Plotnikov, Georgina Quint,  MD. Nyra Capes on 03/20/2023.   Specialty: Internal Medicine Why: Appointment on 03/20/23 at 1:40 pm. Please  bring updated insurance card to appointment. Contact information: [address] [place] [address] [phone]         [facility]. Go to.   Specialty: Gastroenterology Why: When scheduled by the clinic. Contact information: [address] [place] [address] [phone]                 Time coordinating discharge: 39 minutes  Signed:  Saanvi Hakala  [facility] 03/03/2023, 11:49 AM

## 2023-03-03 NOTE — Telephone Encounter (Signed)
PT  is requesting call back. Please advise.

## 2023-03-03 NOTE — TOC Progression Note (Signed)
Transition of Care [facility]) - Progression Note    Patient Details  Name: Samantha [name] MRN: [id number] Date of Birth: September 10, 1966  Transition of [facility]) CM/SW Contact  Leone Haven, RN Phone Number: 03/03/2023, 9:53 AM  Clinical Narrative:    Patient is for dc today, has no needs.        Expected Discharge Plan and Services         Expected Discharge Date: 03/03/23                                     Social Determinants of Health (SDOH) Interventions SDOH Screenings   Food Insecurity: No Food Insecurity (02/28/2023)  Housing: Low Risk  (02/28/2023)  Transportation Needs: No Transportation Needs (02/28/2023)  Utilities: Not At Risk (02/28/2023)  Alcohol Screen: Low Risk  (09/05/2022)  Depression (PHQ2-9): Low Risk  (02/08/2023)  Financial Resource Strain: Low Risk  (09/05/2022)  Physical Activity: Inactive (09/05/2022)  Social Connections: Moderately Isolated (09/05/2022)  Stress: No Stress Concern Present (09/05/2022)  Tobacco Use: Low Risk  (03/02/2023)    Readmission Risk Interventions     No data to display

## 2023-03-03 NOTE — Plan of Care (Signed)

## 2023-03-03 NOTE — Telephone Encounter (Signed)
-----   Message from Benancio Deeds, MD sent at 03/03/2023  8:10 AM EDT ----- Regarding: pyrtle outpatient follow up / labs This is a Pyrtle patient - needs order for Hgb to be drawn early next week if you can place. Also needs outpatient consult to [facility] for hiatal hernia repair - urgent consult / ASAP. Thanks  Dr. Mervyn Skeeters

## 2023-03-04 LAB — CULTURE, BLOOD (ROUTINE X 2)

## 2023-03-05 LAB — CULTURE, BLOOD (ROUTINE X 2): Culture: NO GROWTH

## 2023-03-06 ENCOUNTER — Telehealth: Payer: Self-pay

## 2023-03-06 NOTE — Transitions of Care (Post Inpatient/ED Visit) (Signed)
   03/06/2023  Name: Samantha [name] MRN: [id number] DOB: Mar 30, 1966  Today's TOC FU Call Status: Today's TOC FU Call Status:: Successful TOC FU Call Competed TOC FU Call Complete Date: 03/06/23  Transition Care Management Follow-up Telephone Call Date of Discharge: 03/03/23 Discharge Facility: [facility]) Type of Discharge: Inpatient Admission Primary Inpatient Discharge Diagnosis:: acute blood loss anemia How have you been since you were released from the hospital?: Better (I am feeling a lot better and stronger) Any questions or concerns?: No  Items Reviewed: Did you receive and understand the discharge instructions provided?: Yes Medications obtained and verified?: Yes (Medications Reviewed) (declines full medication reconciliation) Any new allergies since your discharge?: No Dietary orders reviewed?: NA Do you have support at home?: Yes People in Home: spouse, friend(s) Name of Support/Comfort Primary Source: husband and best friend  Home Care and Equipment/Supplies: Were Home Health Services Ordered?: NA Any new equipment or medical supplies ordered?: NA  Functional Questionnaire: Do you need assistance with bathing/showering or dressing?: No Do you need assistance with meal preparation?: No Do you need assistance with eating?: No Do you have difficulty maintaining continence: No Do you need assistance with getting out of bed/getting out of a chair/moving?: No Do you have difficulty managing or taking your medications?: No  Follow up appointments reviewed: PCP Follow-up appointment confirmed?: Yes Date of PCP follow-up appointment?: 03/21/23 Follow-up Provider: Dr. Posey Rea Specialist [facility] Follow-up appointment confirmed?: Yes Date of Specialist follow-up appointment?: 03/14/23 Follow-Up Specialty Provider:: Dr. Rhea Belton GI Do you need transportation to your follow-up appointment?: No Do you understand care options if your condition(s) worsen?: Yes-patient  verbalized understanding  SDOH Interventions Today    Flowsheet Row Most Recent Value  SDOH Interventions   Food Insecurity Interventions Intervention Not Indicated  Transportation Interventions Intervention Not Indicated      TOC Interventions Today    Flowsheet Row Most Recent Value  TOC Interventions   TOC Interventions Discussed/Reviewed TOC Interventions Discussed       Interventions Today    Flowsheet Row Most Recent Value  General Interventions   General Interventions Discussed/Reviewed General Interventions Discussed  Education Interventions   Education Provided Provided Education  Provided Verbal Education On When to see the doctor  Pharmacy Interventions   Pharmacy Dicussed/Reviewed Pharmacy Topics Discussed, Medications and their functions        SIGNATURE Dudley Major RN, BSN,CCM, CDE Care Management Coordinator [facility] Care Management [phone]

## 2023-03-06 NOTE — Telephone Encounter (Signed)
Spoke with pt and she knows to come for labs and is aware of OV appt.

## 2023-03-08 ENCOUNTER — Other Ambulatory Visit (INDEPENDENT_AMBULATORY_CARE_PROVIDER_SITE_OTHER): Payer: Medicare HMO

## 2023-03-08 ENCOUNTER — Telehealth: Payer: Self-pay | Admitting: Internal Medicine

## 2023-03-08 DIAGNOSIS — K253 Acute gastric ulcer without hemorrhage or perforation: Secondary | ICD-10-CM

## 2023-03-08 DIAGNOSIS — D509 Iron deficiency anemia, unspecified: Secondary | ICD-10-CM | POA: Diagnosis not present

## 2023-03-08 DIAGNOSIS — K449 Diaphragmatic hernia without obstruction or gangrene: Secondary | ICD-10-CM

## 2023-03-08 LAB — CBC WITH DIFFERENTIAL/PLATELET
Basophils Absolute: 0.1 10*3/uL (ref 0.0–0.1)
Basophils Relative: 1.1 % (ref 0.0–3.0)
Eosinophils Absolute: 0.3 10*3/uL (ref 0.0–0.7)
Eosinophils Relative: 4.4 % (ref 0.0–5.0)
HCT: 30.2 % — ABNORMAL LOW (ref 36.0–46.0)
Hemoglobin: 10.3 g/dL — ABNORMAL LOW (ref 12.0–15.0)
Lymphocytes Relative: 43.9 % (ref 12.0–46.0)
Lymphs Abs: 3 10*3/uL (ref 0.7–4.0)
MCHC: 34.2 g/dL (ref 30.0–36.0)
MCV: 96.1 fl (ref 78.0–100.0)
Monocytes Absolute: 0.6 10*3/uL (ref 0.1–1.0)
Monocytes Relative: 8.6 % (ref 3.0–12.0)
Neutro Abs: 2.8 10*3/uL (ref 1.4–7.7)
Neutrophils Relative %: 42 % — ABNORMAL LOW (ref 43.0–77.0)
Platelets: 419 10*3/uL — ABNORMAL HIGH (ref 150.0–400.0)
RBC: 3.14 Mil/uL — ABNORMAL LOW (ref 3.87–5.11)
RDW: 14 % (ref 11.5–15.5)
WBC: 6.7 10*3/uL (ref 4.0–10.5)

## 2023-03-08 LAB — IBC + FERRITIN
Ferritin: 32.7 ng/mL (ref 10.0–291.0)
Iron: 59 ug/dL (ref 42–145)
Saturation Ratios: 19.8 % — ABNORMAL LOW (ref 20.0–50.0)
TIBC: 298.2 ug/dL (ref 250.0–450.0)
Transferrin: 213 mg/dL (ref 212.0–360.0)

## 2023-03-08 NOTE — Telephone Encounter (Signed)
Inbound call from patient requesting a call back from a nurse, patient stated she trip and fell on her stomach yesterday and since than she is having severe nausea.She is wondering If there is anything your recommend or can give her for this.

## 2023-03-08 NOTE — Telephone Encounter (Signed)
Pt states she saw Dr. Rhea Belton in the hospital last week and was told to call the office if she had any nausea. Pt reports she has been nauseated for a week. She fell on her stomach last night and states the nausea is a little worse. Asked pt if she was able to eat and drink, she stated she did not eat last night. Husband in the background stated "You ate last night and you are getting ready to eat again." Pt states she has not actually vomited just felt nauseated. Please advise.

## 2023-03-09 MED ORDER — ONDANSETRON HCL 4 MG PO TABS: 4.0000 mg | ORAL_TABLET | Freq: Three times a day (TID) | ORAL | 1 refills | Status: DC | PRN

## 2023-03-09 NOTE — Telephone Encounter (Signed)
Spoke with pt and she is aware. Script sent to pharmacy. 

## 2023-03-09 NOTE — Telephone Encounter (Signed)
Script sent to pharmacy. Detailed message left on pts voicemail.

## 2023-03-09 NOTE — Telephone Encounter (Signed)
Would provide ondansetron ODT 4 mg to be used every 6 hours as needed for nausea or vomiting

## 2023-03-14 ENCOUNTER — Other Ambulatory Visit (INDEPENDENT_AMBULATORY_CARE_PROVIDER_SITE_OTHER): Payer: Medicare HMO

## 2023-03-14 ENCOUNTER — Encounter: Payer: Self-pay | Admitting: Internal Medicine

## 2023-03-14 ENCOUNTER — Ambulatory Visit: Payer: Medicare HMO | Admitting: Internal Medicine

## 2023-03-14 VITALS — BP 126/76 | HR 62 | Ht <= 58 in | Wt 146.4 lb

## 2023-03-14 DIAGNOSIS — K5909 Other constipation: Secondary | ICD-10-CM

## 2023-03-14 DIAGNOSIS — R5383 Other fatigue: Secondary | ICD-10-CM | POA: Diagnosis not present

## 2023-03-14 DIAGNOSIS — K922 Gastrointestinal hemorrhage, unspecified: Secondary | ICD-10-CM

## 2023-03-14 DIAGNOSIS — K253 Acute gastric ulcer without hemorrhage or perforation: Secondary | ICD-10-CM

## 2023-03-14 DIAGNOSIS — K449 Diaphragmatic hernia without obstruction or gangrene: Secondary | ICD-10-CM

## 2023-03-14 DIAGNOSIS — R110 Nausea: Secondary | ICD-10-CM

## 2023-03-14 DIAGNOSIS — R300 Dysuria: Secondary | ICD-10-CM

## 2023-03-14 LAB — COMPREHENSIVE METABOLIC PANEL
ALT: 14 U/L (ref 0–35)
AST: 20 U/L (ref 0–37)
Albumin: 3.6 g/dL (ref 3.5–5.2)
Alkaline Phosphatase: 54 U/L (ref 39–117)
BUN: 11 mg/dL (ref 6–23)
CO2: 31 mEq/L (ref 19–32)
Calcium: 9 mg/dL (ref 8.4–10.5)
Chloride: 102 mEq/L (ref 96–112)
Creatinine, Ser: 1 mg/dL (ref 0.40–1.20)
GFR: 61.92 mL/min (ref >60.00)
Glucose, Bld: 122 mg/dL — ABNORMAL HIGH (ref 70–99)
Potassium: 3.7 mEq/L (ref 3.5–5.1)
Sodium: 140 mEq/L (ref 135–145)
Total Bilirubin: 0.3 mg/dL (ref 0.2–1.2)
Total Protein: 6.7 g/dL (ref 6.0–8.3)

## 2023-03-14 LAB — CBC
HCT: 32.2 % — ABNORMAL LOW (ref 36.0–46.0)
Hemoglobin: 11.1 g/dL — ABNORMAL LOW (ref 12.0–15.0)
MCHC: 34.4 g/dL (ref 30.0–36.0)
MCV: 96 fl (ref 78.0–100.0)
Platelets: 395 10*3/uL (ref 150.0–400.0)
RBC: 3.35 Mil/uL — ABNORMAL LOW (ref 3.87–5.11)
RDW: 13.8 % (ref 11.5–15.5)
WBC: 4.6 10*3/uL (ref 4.0–10.5)

## 2023-03-14 LAB — IBC + FERRITIN
Ferritin: 40.3 ng/mL (ref 10.0–291.0)
Iron: 40 ug/dL — ABNORMAL LOW (ref 42–145)
Saturation Ratios: 13.4 % — ABNORMAL LOW (ref 20.0–50.0)
TIBC: 299.6 ug/dL (ref 250.0–450.0)
Transferrin: 214 mg/dL (ref 212.0–360.0)

## 2023-03-14 NOTE — Patient Instructions (Signed)
_______________________________________________________  If your blood pressure at your visit was 140/90 or greater, please contact your primary care physician to follow up on this.  If you are age 59 or younger, your body mass index should be between 19-25. Your Body mass index is 30.59 kg/m. If this is out of the aformentioned range listed, please consider follow up with your Primary Care Provider.  ________________________________________________________  The [place] [facility] providers would like to encourage you to use [facility] to communicate with providers for non-urgent requests or questions.  Due to long hold times on the telephone, sending your provider a message by [facility] may be a faster and more efficient way to get a response.  Please allow 48 business hours for a response.  Please remember that this is for non-urgent requests.  _______________________________________________________  Your provider has requested that you go to the basement level for lab work before leaving today. Press "B" on the elevator. The lab is located at the first door on the left as you exit the elevator.  Due to recent changes in healthcare laws, you may see the results of your imaging and laboratory studies on MyChart before your provider has had a chance to review them.  We understand that in some cases there may be results that are confusing or concerning to you. Not all laboratory results come back in the same time frame and the provider may be waiting for multiple results in order to interpret others.  Please give [place] 48 hours in order for your provider to thoroughly review all the results before contacting the office for clarification of your results.   Thank you for entrusting me with your care and choosing [facility].  Dr Rhea Belton

## 2023-03-15 ENCOUNTER — Encounter: Payer: Self-pay | Admitting: Internal Medicine

## 2023-03-15 LAB — URINALYSIS, ROUTINE W REFLEX MICROSCOPIC
Bilirubin Urine: NEGATIVE
Hgb urine dipstick: NEGATIVE
Ketones, ur: NEGATIVE
Leukocytes,Ua: NEGATIVE
Nitrite: NEGATIVE
Specific Gravity, Urine: 1.015 (ref 1.000–1.030)
Total Protein, Urine: NEGATIVE
Urine Glucose: NEGATIVE
Urobilinogen, UA: 1 (ref 0.0–1.0)
pH: 8 (ref 5.0–8.0)

## 2023-03-15 NOTE — Progress Notes (Signed)
Subjective:    Patient ID: Samantha [name], female    DOB: October 11, 1966, 57 y.o.   MRN: [id number]  HPI Blyss Lugar is a 57 year old female with a history of chronic iron deficiency anemia, GERD, hiatal hernia with recurrent Cameron's ulcers leading to acute upper GI bleeding, chronic constipation who is seen for follow-up.  She is here today with her 2 sisters.  I just saw her in mid April where she presented with hemodynamically significant upper GI bleed and acute blood loss anemia.  That was also complicated by acute kidney injury and aspiration pneumonia.  She had an upper endoscopy in the acute hospital setting which I performed on 03/01/2023.  She had a 4 cm hiatal hernia with multiple Cameron's ulcers treated with Endo Clip and hemostatic gel.  Today she reports that she has had no further melena.  She has been more adherent with the PPI now 40 mg twice daily.  She is taking famotidine at dinner.  She is using Zofran because she still having nausea.  She has not had any further vomiting.  She has been referred to general surgery but this appointment is later in May around 04/10/2023.  She continues Linzess 290 mcg daily which helps with her constipation.  She continues on oxycodone approximately 10 mg 3 times a day and her MS Contin 30 mg every 12 hours.   Review of Systems As per HPI, otherwise negative  Current Medications, Allergies, Past Medical History, Past Surgical History, Family History and Social History were reviewed in [employer] record.    Objective:   Physical Exam BP 126/76 (BP Location: Left Arm, Patient Position: Sitting, Cuff Size: Normal)   Pulse 62   Ht 4\' 10"  (1.473 m)   Wt 146 lb 6 oz (66.4 kg)   LMP 11/24/2016   SpO2 94%   BMI 30.59 kg/m  Gen: awake, alert, NAD HEENT: anicteric  Abd: soft, NT/ND, +BS throughout Ext: no c/c/e Neuro: nonfocal     Latest Ref Rng & Units 03/14/2023    4:37 PM 03/08/2023    3:28 PM 03/03/2023    12:16 AM  CBC  WBC 4.0 - 10.5 K/uL 4.6  6.7  7.2   Hemoglobin 12.0 - 15.0 g/dL 40.9  81.1  8.7   Hematocrit 36.0 - 46.0 % 32.2  30.2  24.5   Platelets 150.0 - 400.0 K/uL 395.0  419.0  180    Iron/TIBC/Ferritin/ %Sat    Component Value Date/Time   IRON 40 (L) 03/14/2023 1637   TIBC 299.6 03/14/2023 1637   FERRITIN 40.3 03/14/2023 1637   IRONPCTSAT 13.4 (L) 03/14/2023 1637   IRONPCTSAT 6 (L) 01/12/2022 1520         Assessment & Plan:  57 year old female with a history of chronic iron deficiency anemia, GERD, hiatal hernia with recurrent Cameron's ulcers leading to acute upper GI bleeding, chronic constipation who is seen for follow-up.   Recent GI bleed/Cameron's ulcers and hiatal hernia/acute blood loss anemia --blood count is slowly improving.  She needs this hiatal hernia repaired given the recurrent Cameron's ulcers causing acute hemorrhage and hemodynamic instability.  She has been referred to see Dr. Andrey Campanile -- CBC and iron studies today -- May need oral iron, follow-up iron studies -- Keep appointment with Dr. Andrey Campanile for hiatal hernia repair; I will reach out to him to see if we need to perform a barium esophagram before this visit and determine if manometry is required before surgery -- Pantoprazole 40  mg twice daily AC -- Famotidine 20 to 40 mg in the evening  2.  Nausea --Zofran 4 to 8 mg every 8 hours as needed  3.  Chronic constipation --continue Linzess 290 mcg daily  4.  Dysuria --check UA with culture reflex

## 2023-03-16 ENCOUNTER — Telehealth: Payer: Self-pay

## 2023-03-16 NOTE — Telephone Encounter (Signed)
-----   Message from Beverley Fiedler, MD sent at 03/16/2023 11:46 AM EDT ----- Can you make sure pt aware of appt tomorrow with dr. Andrey Campanile.  When I saw her yesterday she thought the appt was may 27th Thanks JMP  ----- Message ----- From: Gaynelle Adu, MD Sent: 03/16/2023  11:29 AM EDT To: Beverley Fiedler, MD  Ok. Thanks for heads up.  She is actually on my clinic schedule for tomorrow Will start with esophagram.  Can probably skip manometry.   w ----- Message ----- From: Beverley Fiedler, MD Sent: 03/15/2023   6:11 PM EDT To: Gaynelle Adu, MD  Samantha [name] will see this lady later in May for hiatal hernia repair consultation.  She has had 2 hemodynamically significant GI bleeds from recurrent Cameron's ulcers including one hospital stay last month.  Just to get her ready for probable surgery: Does she needed barium esophagram?  Does she definitively need manometry?  Anything I can get done before she sees you would be helpful.  Thanks for your help.  All also in sending this I remember I never connected with you by phone.  Let me know if you still need to talk.  Sorry  Vonna Kotyk

## 2023-03-16 NOTE — Telephone Encounter (Signed)
Pt aware of her appt with Dr. Andrey Campanile 03/17/23 at 10am.

## 2023-03-17 DIAGNOSIS — Z9889 Other specified postprocedural states: Secondary | ICD-10-CM | POA: Diagnosis not present

## 2023-03-17 DIAGNOSIS — G4733 Obstructive sleep apnea (adult) (pediatric): Secondary | ICD-10-CM | POA: Diagnosis not present

## 2023-03-17 DIAGNOSIS — Z982 Presence of cerebrospinal fluid drainage device: Secondary | ICD-10-CM | POA: Diagnosis not present

## 2023-03-17 DIAGNOSIS — K5900 Constipation, unspecified: Secondary | ICD-10-CM | POA: Diagnosis not present

## 2023-03-17 DIAGNOSIS — K2100 Gastro-esophageal reflux disease with esophagitis, without bleeding: Secondary | ICD-10-CM | POA: Diagnosis not present

## 2023-03-17 DIAGNOSIS — K449 Diaphragmatic hernia without obstruction or gangrene: Secondary | ICD-10-CM | POA: Diagnosis not present

## 2023-03-17 DIAGNOSIS — F1190 Opioid use, unspecified, uncomplicated: Secondary | ICD-10-CM | POA: Diagnosis not present

## 2023-03-17 DIAGNOSIS — Z862 Personal history of diseases of the blood and blood-forming organs and certain disorders involving the immune mechanism: Secondary | ICD-10-CM | POA: Diagnosis not present

## 2023-03-17 DIAGNOSIS — E611 Iron deficiency: Secondary | ICD-10-CM | POA: Diagnosis not present

## 2023-03-19 ENCOUNTER — Encounter: Payer: Self-pay | Admitting: Internal Medicine

## 2023-03-19 NOTE — Assessment & Plan Note (Signed)
Chronic fatigue-worse.  Discussed continue to use CPAP.  Polypharmacy is contributing.  We will start reducing gabapentin slowly.

## 2023-03-20 ENCOUNTER — Institutional Professional Consult (permissible substitution): Payer: Medicare HMO | Admitting: Internal Medicine

## 2023-03-20 ENCOUNTER — Other Ambulatory Visit: Payer: Self-pay

## 2023-03-20 ENCOUNTER — Other Ambulatory Visit (HOSPITAL_COMMUNITY): Payer: Self-pay | Admitting: General Surgery

## 2023-03-20 ENCOUNTER — Ambulatory Visit: Payer: Medicare HMO | Admitting: Internal Medicine

## 2023-03-20 DIAGNOSIS — K922 Gastrointestinal hemorrhage, unspecified: Secondary | ICD-10-CM

## 2023-03-20 DIAGNOSIS — K449 Diaphragmatic hernia without obstruction or gangrene: Secondary | ICD-10-CM

## 2023-03-20 DIAGNOSIS — D509 Iron deficiency anemia, unspecified: Secondary | ICD-10-CM

## 2023-03-20 DIAGNOSIS — K219 Gastro-esophageal reflux disease without esophagitis: Secondary | ICD-10-CM

## 2023-03-21 ENCOUNTER — Ambulatory Visit: Payer: Medicare HMO | Admitting: Internal Medicine

## 2023-03-21 ENCOUNTER — Telehealth: Payer: Self-pay | Admitting: *Deleted

## 2023-03-21 NOTE — Telephone Encounter (Signed)
Received surgical clearance request from [facility].  Patient not established cardiology patient.  Called [facility] to make aware will need referral if cardiac clearance is needed.   While speaking to [facility]-appt was made for patient with Dr. Bjorn Pippin.

## 2023-03-22 ENCOUNTER — Telehealth: Payer: Self-pay | Admitting: Internal Medicine

## 2023-03-22 NOTE — Telephone Encounter (Signed)
Inbound call from patient, states during her last labs Dr. Rhea Belton recommended for her to begin taking iron supplements. She wanted to let Dr. Rhea Belton know she is already prescribed iron through her PCP.

## 2023-03-22 NOTE — Telephone Encounter (Signed)
See note below, pt is currently taking Accrufer 30mg  daily.

## 2023-03-22 NOTE — Telephone Encounter (Signed)
Ok, thanks.  She should continue and we can followup iron stores

## 2023-03-23 ENCOUNTER — Other Ambulatory Visit: Payer: Self-pay | Admitting: *Deleted

## 2023-03-23 MED ORDER — ONDANSETRON HCL 4 MG PO TABS: 4.0000 mg | ORAL_TABLET | Freq: Three times a day (TID) | ORAL | 0 refills | Status: DC | PRN

## 2023-03-24 ENCOUNTER — Ambulatory Visit (HOSPITAL_COMMUNITY)
Admission: RE | Admit: 2023-03-24 | Discharge: 2023-03-24 | Disposition: A | Discharge status: Home / Self Care | Payer: Medicare HMO | Source: Ambulatory Visit | Attending: General Surgery | Admitting: General Surgery

## 2023-03-24 DIAGNOSIS — K219 Gastro-esophageal reflux disease without esophagitis: Secondary | ICD-10-CM | POA: Insufficient documentation

## 2023-03-24 DIAGNOSIS — K224 Dyskinesia of esophagus: Secondary | ICD-10-CM | POA: Diagnosis not present

## 2023-03-24 DIAGNOSIS — K449 Diaphragmatic hernia without obstruction or gangrene: Secondary | ICD-10-CM | POA: Diagnosis not present

## 2023-03-27 ENCOUNTER — Encounter: Payer: Self-pay | Admitting: Internal Medicine

## 2023-03-27 ENCOUNTER — Other Ambulatory Visit: Payer: Self-pay | Admitting: Radiology

## 2023-03-27 ENCOUNTER — Ambulatory Visit (INDEPENDENT_AMBULATORY_CARE_PROVIDER_SITE_OTHER): Payer: Medicare HMO | Admitting: Internal Medicine

## 2023-03-27 ENCOUNTER — Ambulatory Visit: Payer: Medicare HMO | Admitting: Internal Medicine

## 2023-03-27 VITALS — BP 132/88 | HR 81 | Temp 98.9°F | Ht <= 58 in | Wt 143.0 lb

## 2023-03-27 DIAGNOSIS — N1831 Chronic kidney disease, stage 3a: Secondary | ICD-10-CM

## 2023-03-27 DIAGNOSIS — G4733 Obstructive sleep apnea (adult) (pediatric): Secondary | ICD-10-CM | POA: Diagnosis not present

## 2023-03-27 DIAGNOSIS — M79605 Pain in left leg: Secondary | ICD-10-CM

## 2023-03-27 DIAGNOSIS — M545 Low back pain, unspecified: Secondary | ICD-10-CM | POA: Diagnosis not present

## 2023-03-27 DIAGNOSIS — R413 Other amnesia: Secondary | ICD-10-CM | POA: Diagnosis not present

## 2023-03-27 MED ORDER — OXYCODONE HCL 10 MG PO TABS: 10.0000 mg | ORAL_TABLET | Freq: Three times a day (TID) | ORAL | 0 refills | Status: DC | PRN

## 2023-03-27 MED ORDER — MORPHINE SULFATE ER 30 MG PO TBCR: 30.0000 mg | EXTENDED_RELEASE_TABLET | Freq: Two times a day (BID) | ORAL | 0 refills | Status: DC

## 2023-03-27 NOTE — Assessment & Plan Note (Signed)
Hydrate well 

## 2023-03-27 NOTE — Assessment & Plan Note (Signed)
Pulm appt is pending this week

## 2023-03-27 NOTE — Assessment & Plan Note (Addendum)
Chronic pain in the back, hips - I took over [name]'s pain management (she was treated at [facility] in the past). On Oxycodone and MS Contin   Potential benefits of a long term opioids use as well as potential risks (i.e. addiction risk, apnea etc) and complications (i.e. Somnolence, constipation and others) were explained to the patient and were aknowledged. [name] is giving [name] her pain meds

## 2023-03-27 NOTE — Assessment & Plan Note (Signed)
[name] is giving Samantha [name] her pain meds

## 2023-03-27 NOTE — Progress Notes (Signed)
Subjective:  Patient ID: [name], female    DOB: 05-28-1966  Age: 57 y.o. MRN: [id number]  CC: No chief complaint on file.   HPI JHADE KIRKENDOLL presents for chronic pain,  stress, anxiety Pt fell x3. Less dizzy  Outpatient Medications Prior to Visit  Medication Sig Dispense Refill   amLODipine (NORVASC) 2.5 MG tablet Take 1 tablet (2.5 mg total) by mouth daily. 90 tablet 3   atorvastatin (LIPITOR) 20 MG tablet TAKE 1 TABLET EVERY DAY AT 6 PM (Patient taking differently: Take 20 mg by mouth every evening.) 90 tablet 3   busPIRone (BUSPAR) 10 MG tablet TAKE 1 TABLET THREE TIMES DAILY (Patient taking differently: Take 10 mg by mouth 3 (three) times daily.) 270 tablet 3   Calcium Carb-Cholecalciferol (CALCIUM+D3 PO) Take 1 tablet by mouth 2 (two) times daily with a meal.     Docusate Calcium (STOOL SOFTENER PO) Take 1 capsule by mouth 2 (two) times daily as needed (for constipation).     donepezil (ARICEPT) 10 MG tablet TAKE 1 TABLET AT BEDTIME (Patient taking differently: Take 10 mg by mouth at bedtime.) 90 tablet 3   DULoxetine (CYMBALTA) 60 MG capsule TAKE 1 CAPSULE EVERY DAY (Patient taking differently: Take 60 mg by mouth at bedtime.) 90 capsule 3   famotidine (PEPCID) 40 MG tablet TAKE 1 TABLET EVERY DAY (Patient taking differently: Take 40 mg by mouth daily as needed for heartburn or indigestion (if Protonix is ineffective).) 90 tablet 3   Ferric Maltol (ACCRUFER) 30 MG CAPS 1 po bid (Patient taking differently: Take 30 mg by mouth 2 (two) times daily.) 60 capsule 5   gabapentin (NEURONTIN) 600 MG tablet Take one tablet at lunch and one at bedtime for 1 week, then one at night once a day (Patient taking differently: Take 600 mg by mouth at bedtime.) 60 tablet 3   lactulose (CHRONULAC) 10 GM/15ML solution Take 30-45 mLs (20-30 g total) by mouth 2 (two) times daily as needed for moderate constipation or severe constipation. 946 mL 5   lamoTRIgine (LAMICTAL) 100 MG tablet TAKE 1  TABLET EVERY MORNING, AND 1 AND 1/2 TABLETS AT NIGHT. (Patient taking differently: Take 100-150 mg by mouth See admin instructions. Take 100 mg by mouth in the morning and 150 mg at bedtime) 225 tablet 3   lidocaine (LIDODERM) 5 % Place 1 patch onto the skin daily. Remove & Discard patch within 12 hours or as directed by MD (Patient taking differently: Place 1 patch onto the skin daily as needed (for pain- Remove & Discard patch within 12 hours or as directed by MD).) 180 patch 1   Lidocaine HCl (ASPERCREME LIDOCAINE ESSENTIAL) 4 % LIQD Apply 1 application  topically 2 (two) times daily as needed (to painful sites).     linaclotide (LINZESS) 290 MCG CAPS capsule TAKE 1 CAPSULE DAILY BEFORE BREAKFAST (Patient taking differently: Take 290 mcg by mouth daily before breakfast.) 90 capsule 2   memantine (NAMENDA) 5 MG tablet TAKE 1 TABLET TWICE DAILY (Patient taking differently: Take 5 mg by mouth in the morning and at bedtime.) 180 tablet 3   ondansetron (ZOFRAN) 4 MG tablet Take 1 tablet (4 mg total) by mouth every 8 (eight) hours as needed for nausea or vomiting. 90 tablet 0   pantoprazole (PROTONIX) 40 MG tablet Take 1 tablet (40 mg total) by mouth 2 (two) times daily before a meal. 60 tablet 2   Probiotic Product (ALIGN) 4 MG CAPS Take 1 capsule (  4 mg total) by mouth daily. (Patient taking differently: Take 4 mg by mouth daily as needed (for stomach or constipation issues).) 90 capsule 1   promethazine (PHENERGAN) 12.5 MG tablet Take 1 tablet (12.5 mg total) by mouth every 6 (six) hours as needed for nausea or vomiting. 40 tablet 0   morphine (MS CONTIN) 30 MG 12 hr tablet Take 1 tablet (30 mg total) by mouth every 12 (twelve) hours. (Patient taking differently: Take 30 mg by mouth See admin instructions. Take 30 mg by mouth at 9 AM and 9 PM) 60 tablet 0   Oxycodone HCl 10 MG TABS Take 1 tablet (10 mg total) by mouth 3 (three) times daily as needed. (Patient taking differently: Take 10 mg by mouth See  admin instructions. Take 10 mg by mouth at 11 AM, 1 PM, and 3 PM) 90 tablet 0   sucralfate (CARAFATE) 1 GM/10ML suspension Take 10 mLs (1 g total) by mouth 4 (four) times daily for 5 days. 200 mL 0   No facility-administered medications prior to visit.    ROS: Review of Systems  Constitutional:  Positive for fatigue. Negative for activity change, appetite change, chills and unexpected weight change.  HENT:  Negative for congestion, mouth sores, sinus pressure and tinnitus.   Eyes:  Negative for visual disturbance.  Respiratory:  Negative for cough and chest tightness.   Gastrointestinal:  Negative for abdominal pain and nausea.  Genitourinary:  Negative for difficulty urinating, frequency and vaginal pain.  Musculoskeletal:  Positive for arthralgias, back pain and gait problem.  Skin:  Negative for pallor and rash.  Neurological:  Positive for dizziness. Negative for tremors, weakness, numbness and headaches.  Psychiatric/Behavioral:  Positive for suicidal ideas. Negative for confusion and sleep disturbance. The patient is nervous/anxious.     Objective:  BP 132/88 (BP Location: Left Arm, Patient Position: Sitting, Cuff Size: Normal)   Pulse 81   Temp 98.9 F (37.2 C) (Oral)   Ht 4\' 10"  (1.473 m)   Wt 143 lb (64.9 kg)   LMP 11/24/2016   SpO2 95%   BMI 29.89 kg/m   BP Readings from Last 3 Encounters:  03/27/23 132/88  03/14/23 126/76  03/03/23 (!) 167/80    Wt Readings from Last 3 Encounters:  03/27/23 143 lb (64.9 kg)  03/14/23 146 lb 6 oz (66.4 kg)  03/03/23 152 lb 1.9 oz (69 kg)    Physical Exam Constitutional:      General: She is not in acute distress.    Appearance: She is well-developed. She is obese.  HENT:     Head: Normocephalic.     Right Ear: External ear normal.     Left Ear: External ear normal.     Nose: Nose normal.  Eyes:     General:        Right eye: No discharge.        Left eye: No discharge.     Conjunctiva/sclera: Conjunctivae normal.      Pupils: Pupils are equal, round, and reactive to light.  Neck:     Thyroid: No thyromegaly.     Vascular: No JVD.     Trachea: No tracheal deviation.  Cardiovascular:     Rate and Rhythm: Normal rate and regular rhythm.     Heart sounds: Normal heart sounds.  Pulmonary:     Effort: No respiratory distress.     Breath sounds: No stridor. No wheezing.  Abdominal:     General: Bowel sounds are normal.  There is no distension.     Palpations: Abdomen is soft. There is no mass.     Tenderness: There is no abdominal tenderness. There is no guarding or rebound.  Musculoskeletal:        General: Tenderness present.     Cervical back: Normal range of motion and neck supple. No rigidity.     Right lower leg: No edema.     Left lower leg: No edema.  Lymphadenopathy:     Cervical: No cervical adenopathy.  Skin:    Findings: No erythema or rash.  Neurological:     Mental Status: She is oriented to person, place, and time.     Cranial Nerves: No cranial nerve deficit.     Motor: No abnormal muscle tone.     Coordination: Coordination normal.     Gait: Gait normal.     Deep Tendon Reflexes: Reflexes normal.  Psychiatric:        Behavior: Behavior normal.        Thought Content: Thought content normal.        Judgment: Judgment normal.     Lab Results  Component Value Date   WBC 4.6 03/14/2023   HGB 11.1 (L) 03/14/2023   HCT 32.2 (L) 03/14/2023   PLT 395.0 03/14/2023   GLUCOSE 122 (H) 03/14/2023   CHOL 217 (H) 09/02/2021   TRIG 204.0 (H) 09/02/2021   HDL 52.80 09/02/2021   LDLDIRECT 120.0 09/02/2021   LDLCALC 85 07/16/2020   ALT 14 03/14/2023   AST 20 03/14/2023   NA 140 03/14/2023   K 3.7 03/14/2023   CL 102 03/14/2023   CREATININE 1.00 03/14/2023   BUN 11 03/14/2023   CO2 31 03/14/2023   TSH 3.09 09/02/2021   INR 1.1 02/28/2023   HGBA1C 5.7 01/12/2022    DG UGI W DOUBLE CM (HD BA)  Result Date: 03/24/2023 CLINICAL DATA:  Provided history: Hiatal hernia.  Gastroesophageal reflux disease, unspecified whether esophagitis present. Additional history: history of GI bleed, Cameron lesions, hiatal hernia. EXAM: UPPER GI SERIES WITH HIGH DENSITY WITHOUT KUB TECHNIQUE: A scout radiograph of the abdomen was acquired. Subsequently, a combined double and single contrast examination was performed using effervescent crystals, high-density barium and thin liquid barium. The exam was performed by Anders Grant NP and was supervised and interpreted by Dr. Jackey Loge. FLUOROSCOPY: Fluoroscopy time: 1 minute 54 seconds (20.5 mGy). COMPARISON:  CT chest/abdomen/pelvis 02/28/2023. FINDINGS: A scout radiograph of the abdomen was acquired. Nonobstructive bowel gas pattern. Cholecystectomy clips within the right upper quadrant of the abdomen. Spondylosis and levocurvature of the lumbar spine Fluoroscopic evaluation demonstrates normal caliber and smooth contour of the esophagus. No evidence of fixed stricture, mass or mucosal abnormality. Mild-to-moderate intermittent esophageal dysmotility with tertiary contractions. Moderate-sized mixed-type hiatal hernia with both sliding and paraesophageal components. Otherwise unremarkable appearance of the stomach, duodenal bulb and duodenal sweep. No appreciable ulceration, fold thickening or mass. Small to moderate-volume gastroesophageal reflux was observed to the level of the mid esophagus with Valsalva. The patient swallowed a 13 mm barium tablet, which freely passed into the stomach. IMPRESSION: 1. Moderate-sized mixed-type hiatal hernia with both sliding and paraesophageal components. 2. Small to moderate-volume gastroesophageal reflux observed to the level of the mid esophagus with Valsalva. 3. Mild-to-moderate esophageal dysmotility. Electronically Signed   By: Jackey Loge D.O.   On: 03/24/2023 10:30    Assessment & Plan:   Problem List Items Addressed This Visit     OSA on CPAP  Pulm appt is pending this week      Low  back pain radiating to left lower extremity - Primary    Chronic pain in the back, hips - I took over [name]'s pain management (she was treated at [facility] in the past). On Oxycodone and MS Contin   Potential benefits of a long term opioids use as well as potential risks (i.e. addiction risk, apnea etc) and complications (i.e. Somnolence, constipation and others) were explained to the patient and were aknowledged. [name] is giving [name] her pain meds      Relevant Medications   morphine (MS CONTIN) 30 MG 12 hr tablet   Oxycodone HCl 10 MG TABS   Memory loss    [name] is giving [name] her pain meds      CKD (chronic kidney disease), stage III (HCC)    Hydrate well         Meds ordered this encounter  Medications   morphine (MS CONTIN) 30 MG 12 hr tablet    Sig: Take 1 tablet (30 mg total) by mouth every 12 (twelve) hours.    Dispense:  60 tablet    Refill:  0    Please fill on or after 03/27/23   Oxycodone HCl 10 MG TABS    Sig: Take 1 tablet (10 mg total) by mouth 3 (three) times daily as needed.    Dispense:  90 tablet    Refill:  0    Please fill on or after 03/27/23      Follow-up: No follow-ups on file.  Sonda Primes, MD

## 2023-03-29 ENCOUNTER — Institutional Professional Consult (permissible substitution): Payer: Medicare HMO | Admitting: Nurse Practitioner

## 2023-04-03 ENCOUNTER — Other Ambulatory Visit: Payer: Self-pay

## 2023-04-03 ENCOUNTER — Telehealth: Payer: Self-pay | Admitting: Internal Medicine

## 2023-04-03 DIAGNOSIS — D509 Iron deficiency anemia, unspecified: Secondary | ICD-10-CM

## 2023-04-03 NOTE — Telephone Encounter (Signed)
Pt reports that she has been really tired/lethargic, sleeping a lot. States this is how she felt when she had bleeding ulcers. Asked pt if her stools were dark/black and pt reports they are not. She wonders if she may need to have her blood count checked. Please advise.

## 2023-04-03 NOTE — Telephone Encounter (Signed)
Patient called in wanting to speak with a nurse , stated she is having some health issues and she think she might need some blood work done.Please advise.

## 2023-04-03 NOTE — Telephone Encounter (Signed)
Cbc, ferritin with ibc

## 2023-04-03 NOTE — Telephone Encounter (Signed)
Spoke with pt and she will come for labs. Orders in epic.

## 2023-04-11 ENCOUNTER — Other Ambulatory Visit (INDEPENDENT_AMBULATORY_CARE_PROVIDER_SITE_OTHER): Payer: Medicare HMO

## 2023-04-11 DIAGNOSIS — D509 Iron deficiency anemia, unspecified: Secondary | ICD-10-CM

## 2023-04-11 LAB — CBC WITH DIFFERENTIAL/PLATELET
Basophils Absolute: 0.1 10*3/uL (ref 0.0–0.1)
Basophils Relative: 0.7 % (ref 0.0–3.0)
Eosinophils Absolute: 0.3 10*3/uL (ref 0.0–0.7)
Eosinophils Relative: 4.3 % (ref 0.0–5.0)
HCT: 37.5 % (ref 36.0–46.0)
Hemoglobin: 12.4 g/dL (ref 12.0–15.0)
Lymphocytes Relative: 42.6 % (ref 12.0–46.0)
Lymphs Abs: 2.9 10*3/uL (ref 0.7–4.0)
MCHC: 33 g/dL (ref 30.0–36.0)
MCV: 96.5 fl (ref 78.0–100.0)
Monocytes Absolute: 0.6 10*3/uL (ref 0.1–1.0)
Monocytes Relative: 9.4 % (ref 3.0–12.0)
Neutro Abs: 2.9 10*3/uL (ref 1.4–7.7)
Neutrophils Relative %: 43 % (ref 43.0–77.0)
Platelets: 401 10*3/uL — ABNORMAL HIGH (ref 150.0–400.0)
RBC: 3.89 Mil/uL (ref 3.87–5.11)
RDW: 13.4 % (ref 11.5–15.5)
WBC: 6.8 10*3/uL (ref 4.0–10.5)

## 2023-04-11 LAB — IBC + FERRITIN
Ferritin: 20.2 ng/mL (ref 10.0–291.0)
Iron: 95 ug/dL (ref 42–145)
Saturation Ratios: 28.6 % (ref 20.0–50.0)
TIBC: 331.8 ug/dL (ref 250.0–450.0)
Transferrin: 237 mg/dL (ref 212.0–360.0)

## 2023-04-14 ENCOUNTER — Other Ambulatory Visit: Payer: Self-pay | Admitting: Internal Medicine

## 2023-04-24 ENCOUNTER — Other Ambulatory Visit: Payer: Self-pay

## 2023-04-24 ENCOUNTER — Telehealth: Payer: Self-pay | Admitting: Internal Medicine

## 2023-04-24 DIAGNOSIS — D509 Iron deficiency anemia, unspecified: Secondary | ICD-10-CM

## 2023-04-24 NOTE — Telephone Encounter (Signed)
Inbound call from patient she was instructed to watch her stool colors since she has a bleeding ulcer. States they have gotten dark. She would like to have her labs done to see the level of her hemoglobin. Requesting a call back to discuss further. Please advise, thank you.

## 2023-04-24 NOTE — Telephone Encounter (Signed)
See note below. Pt is on Iron but she states her stool is darker than it usually is, she wants to come in for cbc. Please advise.

## 2023-04-24 NOTE — Telephone Encounter (Signed)
Order in. Pt aware.

## 2023-04-24 NOTE — Telephone Encounter (Signed)
CBC

## 2023-04-25 ENCOUNTER — Other Ambulatory Visit (INDEPENDENT_AMBULATORY_CARE_PROVIDER_SITE_OTHER): Payer: Medicare HMO

## 2023-04-25 ENCOUNTER — Telehealth: Payer: Self-pay | Admitting: Internal Medicine

## 2023-04-25 DIAGNOSIS — D509 Iron deficiency anemia, unspecified: Secondary | ICD-10-CM

## 2023-04-25 DIAGNOSIS — K922 Gastrointestinal hemorrhage, unspecified: Secondary | ICD-10-CM | POA: Diagnosis not present

## 2023-04-25 LAB — CBC WITH DIFFERENTIAL/PLATELET
Basophils Absolute: 0.1 10*3/uL (ref 0.0–0.1)
Basophils Relative: 0.8 % (ref 0.0–3.0)
Eosinophils Absolute: 0.2 10*3/uL (ref 0.0–0.7)
Eosinophils Relative: 2.2 % (ref 0.0–5.0)
HCT: 38.5 % (ref 36.0–46.0)
Hemoglobin: 12.8 g/dL (ref 12.0–15.0)
Lymphocytes Relative: 28.5 % (ref 12.0–46.0)
Lymphs Abs: 2.2 10*3/uL (ref 0.7–4.0)
MCHC: 33.2 g/dL (ref 30.0–36.0)
MCV: 95.7 fl (ref 78.0–100.0)
Monocytes Absolute: 0.8 10*3/uL (ref 0.1–1.0)
Monocytes Relative: 10.2 % (ref 3.0–12.0)
Neutro Abs: 4.5 10*3/uL (ref 1.4–7.7)
Neutrophils Relative %: 58.3 % (ref 43.0–77.0)
Platelets: 436 10*3/uL — ABNORMAL HIGH (ref 150.0–400.0)
RBC: 4.02 Mil/uL (ref 3.87–5.11)
RDW: 13 % (ref 11.5–15.5)
WBC: 7.7 10*3/uL (ref 4.0–10.5)

## 2023-04-25 LAB — IBC + FERRITIN
Ferritin: 22.3 ng/mL (ref 10.0–291.0)
Iron: 54 ug/dL (ref 42–145)
Saturation Ratios: 14.8 % — ABNORMAL LOW (ref 20.0–50.0)
TIBC: 365.4 ug/dL (ref 250.0–450.0)
Transferrin: 261 mg/dL (ref 212.0–360.0)

## 2023-04-25 NOTE — Telephone Encounter (Signed)
PT is calling to have a nurse go over the results of her lab work in May. Please advise.

## 2023-04-25 NOTE — Telephone Encounter (Signed)
Pt just called to let us know she came in to have her labs drawn today. Let her know we will call her with the results when we have them.

## 2023-04-26 ENCOUNTER — Encounter: Payer: Self-pay | Admitting: Internal Medicine

## 2023-04-26 ENCOUNTER — Institutional Professional Consult (permissible substitution): Payer: Medicare HMO | Admitting: Nurse Practitioner

## 2023-04-26 ENCOUNTER — Ambulatory Visit (INDEPENDENT_AMBULATORY_CARE_PROVIDER_SITE_OTHER): Payer: Medicare HMO | Admitting: Internal Medicine

## 2023-04-26 VITALS — BP 150/100 | HR 77 | Temp 98.4°F | Ht <= 58 in | Wt 140.0 lb

## 2023-04-26 DIAGNOSIS — R413 Other amnesia: Secondary | ICD-10-CM | POA: Diagnosis not present

## 2023-04-26 DIAGNOSIS — M545 Low back pain, unspecified: Secondary | ICD-10-CM | POA: Diagnosis not present

## 2023-04-26 DIAGNOSIS — Z8673 Personal history of transient ischemic attack (TIA), and cerebral infarction without residual deficits: Secondary | ICD-10-CM | POA: Diagnosis not present

## 2023-04-26 DIAGNOSIS — I10 Essential (primary) hypertension: Secondary | ICD-10-CM | POA: Diagnosis not present

## 2023-04-26 DIAGNOSIS — K253 Acute gastric ulcer without hemorrhage or perforation: Secondary | ICD-10-CM | POA: Diagnosis not present

## 2023-04-26 DIAGNOSIS — M79605 Pain in left leg: Secondary | ICD-10-CM | POA: Diagnosis not present

## 2023-04-26 DIAGNOSIS — K250 Acute gastric ulcer with hemorrhage: Secondary | ICD-10-CM

## 2023-04-26 DIAGNOSIS — N1831 Chronic kidney disease, stage 3a: Secondary | ICD-10-CM | POA: Diagnosis not present

## 2023-04-26 MED ORDER — MORPHINE SULFATE ER 30 MG PO TBCR: 30.0000 mg | EXTENDED_RELEASE_TABLET | Freq: Two times a day (BID) | ORAL | 0 refills | Status: DC

## 2023-04-26 MED ORDER — OXYCODONE HCL 10 MG PO TABS: 10.0000 mg | ORAL_TABLET | Freq: Three times a day (TID) | ORAL | 0 refills | Status: DC | PRN

## 2023-04-26 NOTE — Assessment & Plan Note (Signed)
Chronic pain in the back, hips - I took over Samantha [name]'s pain management (she was treated at [facility] in the past). On Oxycodone and MS Contin   Potential benefits of a long term opioids use as well as potential risks (i.e. addiction risk, apnea etc) and complications (i.e. Somnolence, constipation and others) were explained to the patient and were aknowledged. Mark is giving Samantha [name] her pain meds 

## 2023-04-26 NOTE — Assessment & Plan Note (Signed)
Cameron ulcers - w/bleed - pending a robotic hiatal hernia repair with possible mesh with fundoplication/wrap w/Dr Andrey Campanile.

## 2023-04-26 NOTE — Assessment & Plan Note (Signed)
Hydrate well 

## 2023-04-26 NOTE — Progress Notes (Signed)
Subjective:  Patient ID: Samantha [name], female    DOB: 08/14/1966  Age: 57 y.o. MRN: [id number]  CC: Follow-up (4 WEEK F/U)   HPI Samantha [name] presents for chronic pain and ADD Samantha [name] is sick - s/p CABG, AVR. She is here w/Lisa C/o Cameron ulcers - w/bleed - pending a robotic hiatal hernia repair with possible mesh with fundoplication/wrap w/Dr Andrey Campanile.   Outpatient Medications Prior to Visit  Medication Sig Dispense Refill   amLODipine (NORVASC) 2.5 MG tablet Take 1 tablet (2.5 mg total) by mouth daily. 90 tablet 3   atorvastatin (LIPITOR) 20 MG tablet TAKE 1 TABLET EVERY DAY AT 6 PM (Patient taking differently: Take 20 mg by mouth every evening.) 90 tablet 3   busPIRone (BUSPAR) 10 MG tablet TAKE 1 TABLET THREE TIMES DAILY (Patient taking differently: Take 10 mg by mouth 3 (three) times daily.) 270 tablet 3   Calcium Carb-Cholecalciferol (CALCIUM+D3 PO) Take 1 tablet by mouth 2 (two) times daily with a meal.     Docusate Calcium (STOOL SOFTENER PO) Take 1 capsule by mouth 2 (two) times daily as needed (for constipation).     donepezil (ARICEPT) 10 MG tablet TAKE 1 TABLET AT BEDTIME (Patient taking differently: Take 10 mg by mouth at bedtime.) 90 tablet 3   DULoxetine (CYMBALTA) 60 MG capsule TAKE 1 CAPSULE EVERY DAY (Patient taking differently: Take 60 mg by mouth at bedtime.) 90 capsule 3   famotidine (PEPCID) 40 MG tablet TAKE 1 TABLET EVERY DAY (Patient taking differently: Take 40 mg by mouth daily as needed for heartburn or indigestion (if Protonix is ineffective).) 90 tablet 3   Ferric Maltol (ACCRUFER) 30 MG CAPS 1 po bid (Patient taking differently: Take 30 mg by mouth 2 (two) times daily.) 60 capsule 5   gabapentin (NEURONTIN) 600 MG tablet Take one tablet at lunch and one at bedtime for 1 week, then one at night once a day (Patient taking differently: Take 600 mg by mouth at bedtime.) 60 tablet 3   lactulose (CHRONULAC) 10 GM/15ML solution Take 30-45 mLs (20-30 g total) by  mouth 2 (two) times daily as needed for moderate constipation or severe constipation. 946 mL 5   lamoTRIgine (LAMICTAL) 100 MG tablet TAKE 1 TABLET EVERY MORNING, AND 1 AND 1/2 TABLETS AT NIGHT. (Patient taking differently: Take 100-150 mg by mouth See admin instructions. Take 100 mg by mouth in the morning and 150 mg at bedtime) 225 tablet 3   lidocaine (LIDODERM) 5 % Place 1 patch onto the skin daily. Remove & Discard patch within 12 hours or as directed by MD (Patient taking differently: Place 1 patch onto the skin daily as needed (for pain- Remove & Discard patch within 12 hours or as directed by MD).) 180 patch 1   Lidocaine HCl (ASPERCREME LIDOCAINE ESSENTIAL) 4 % LIQD Apply 1 application  topically 2 (two) times daily as needed (to painful sites).     linaclotide (LINZESS) 290 MCG CAPS capsule TAKE 1 CAPSULE DAILY BEFORE BREAKFAST (Patient taking differently: Take 290 mcg by mouth daily before breakfast.) 90 capsule 2   memantine (NAMENDA) 5 MG tablet TAKE 1 TABLET TWICE DAILY (Patient taking differently: Take 5 mg by mouth in the morning and at bedtime.) 180 tablet 3   morphine (MS CONTIN) 30 MG 12 hr tablet Take 1 tablet (30 mg total) by mouth every 12 (twelve) hours. 60 tablet 0   olmesartan (BENICAR) 20 MG tablet Take 20 mg by mouth daily.  ondansetron (ZOFRAN) 4 MG tablet TAKE 1 TABLET EVERY 8 HOURS AS NEEDED FOR NAUSEA OR VOMITING. 90 tablet 3   Oxycodone HCl 10 MG TABS Take 1 tablet (10 mg total) by mouth 3 (three) times daily as needed. 90 tablet 0   pantoprazole (PROTONIX) 40 MG tablet Take 1 tablet (40 mg total) by mouth 2 (two) times daily before a meal. 60 tablet 2   Probiotic Product (ALIGN) 4 MG CAPS Take 1 capsule (4 mg total) by mouth daily. (Patient taking differently: Take 4 mg by mouth daily as needed (for stomach or constipation issues).) 90 capsule 1   promethazine (PHENERGAN) 12.5 MG tablet Take 1 tablet (12.5 mg total) by mouth every 6 (six) hours as needed for nausea or  vomiting. 40 tablet 0   sucralfate (CARAFATE) 1 GM/10ML suspension Take 10 mLs (1 g total) by mouth 4 (four) times daily for 5 days. 200 mL 0   No facility-administered medications prior to visit.    ROS: Review of Systems  Constitutional:  Negative for activity change, appetite change, chills, fatigue and unexpected weight change.  HENT:  Negative for congestion, mouth sores and sinus pressure.   Eyes:  Negative for visual disturbance.  Respiratory:  Negative for cough and chest tightness.   Gastrointestinal:  Negative for abdominal pain and nausea.  Genitourinary:  Negative for difficulty urinating, frequency and vaginal pain.  Musculoskeletal:  Positive for arthralgias, back pain and gait problem.  Skin:  Negative for pallor and rash.  Neurological:  Negative for dizziness, tremors, weakness, numbness and headaches.  Psychiatric/Behavioral:  Negative for confusion, sleep disturbance and suicidal ideas.     Objective:  BP (!) 150/100 (BP Location: Left Arm, Patient Position: Sitting, Cuff Size: Normal)   Pulse 77   Temp 98.4 F (36.9 C) (Oral)   Ht 4\' 10"  (1.473 m)   Wt 140 lb (63.5 kg)   LMP 11/24/2016   SpO2 95%   BMI 29.26 kg/m   BP Readings from Last 3 Encounters:  04/26/23 (!) 150/100  03/27/23 132/88  03/14/23 126/76    Wt Readings from Last 3 Encounters:  04/26/23 140 lb (63.5 kg)  03/27/23 143 lb (64.9 kg)  03/14/23 146 lb 6 oz (66.4 kg)    Physical Exam Constitutional:      General: She is not in acute distress.    Appearance: She is well-developed. She is obese.  HENT:     Head: Normocephalic.     Right Ear: External ear normal.     Left Ear: External ear normal.     Nose: Nose normal.  Eyes:     General:        Right eye: No discharge.        Left eye: No discharge.     Conjunctiva/sclera: Conjunctivae normal.     Pupils: Pupils are equal, round, and reactive to light.  Neck:     Thyroid: No thyromegaly.     Vascular: No JVD.     Trachea: No  tracheal deviation.  Cardiovascular:     Rate and Rhythm: Normal rate and regular rhythm.     Heart sounds: Normal heart sounds.  Pulmonary:     Effort: No respiratory distress.     Breath sounds: No stridor. No wheezing.  Abdominal:     General: Bowel sounds are normal. There is no distension.     Palpations: Abdomen is soft. There is no mass.     Tenderness: There is no abdominal tenderness. There  is no guarding or rebound.  Musculoskeletal:        General: Tenderness present.     Cervical back: Normal range of motion and neck supple. No rigidity.     Right lower leg: No edema.     Left lower leg: No edema.  Lymphadenopathy:     Cervical: No cervical adenopathy.  Skin:    Findings: No erythema or rash.  Neurological:     Cranial Nerves: No cranial nerve deficit.     Motor: No abnormal muscle tone.     Coordination: Coordination normal.     Deep Tendon Reflexes: Reflexes normal.  Psychiatric:        Behavior: Behavior normal.        Thought Content: Thought content normal.        Judgment: Judgment normal.     A total time of 45 minutes was spent preparing to see the patient, reviewing tests, x-rays, operative reports and other medical records w/pt and [name].  Also, obtaining history and performing comprehensive physical exam.  Additionally, counseling the patient regarding the above listed issues.   Finally, documenting clinical information in the health records, coordination of care, educating the patient - CVA, pain, GI bleed.    Lab Results  Component Value Date   WBC 7.7 04/25/2023   HGB 12.8 04/25/2023   HCT 38.5 04/25/2023   PLT 436.0 (H) 04/25/2023   GLUCOSE 122 (H) 03/14/2023   CHOL 217 (H) 09/02/2021   TRIG 204.0 (H) 09/02/2021   HDL 52.80 09/02/2021   LDLDIRECT 120.0 09/02/2021   LDLCALC 85 07/16/2020   ALT 14 03/14/2023   AST 20 03/14/2023   NA 140 03/14/2023   K 3.7 03/14/2023   CL 102 03/14/2023   CREATININE 1.00 03/14/2023   BUN 11 03/14/2023   CO2  31 03/14/2023   TSH 3.09 09/02/2021   INR 1.1 02/28/2023   HGBA1C 5.7 01/12/2022    DG UGI W DOUBLE CM (HD BA)  Result Date: 03/24/2023 CLINICAL DATA:  Provided history: Hiatal hernia. Gastroesophageal reflux disease, unspecified whether esophagitis present. Additional history: history of GI bleed, Cameron lesions, hiatal hernia. EXAM: UPPER GI SERIES WITH HIGH DENSITY WITHOUT KUB TECHNIQUE: A scout radiograph of the abdomen was acquired. Subsequently, a combined double and single contrast examination was performed using effervescent crystals, high-density barium and thin liquid barium. The exam was performed by Anders Grant NP and was supervised and interpreted by Dr. Jackey Loge. FLUOROSCOPY: Fluoroscopy time: 1 minute 54 seconds (20.5 mGy). COMPARISON:  CT chest/abdomen/pelvis 02/28/2023. FINDINGS: A scout radiograph of the abdomen was acquired. Nonobstructive bowel gas pattern. Cholecystectomy clips within the right upper quadrant of the abdomen. Spondylosis and levocurvature of the lumbar spine Fluoroscopic evaluation demonstrates normal caliber and smooth contour of the esophagus. No evidence of fixed stricture, mass or mucosal abnormality. Mild-to-moderate intermittent esophageal dysmotility with tertiary contractions. Moderate-sized mixed-type hiatal hernia with both sliding and paraesophageal components. Otherwise unremarkable appearance of the stomach, duodenal bulb and duodenal sweep. No appreciable ulceration, fold thickening or mass. Small to moderate-volume gastroesophageal reflux was observed to the level of the mid esophagus with Valsalva. The patient swallowed a 13 mm barium tablet, which freely passed into the stomach. IMPRESSION: 1. Moderate-sized mixed-type hiatal hernia with both sliding and paraesophageal components. 2. Small to moderate-volume gastroesophageal reflux observed to the level of the mid esophagus with Valsalva. 3. Mild-to-moderate esophageal dysmotility.  Electronically Signed   By: Jackey Loge D.O.   On: 03/24/2023 10:30  Assessment & Plan:   Problem List Items Addressed This Visit     Low back pain radiating to left lower extremity    Chronic pain in the back, hips - I took over [name]'s pain management (she was treated at [facility] in the past). On Oxycodone and MS Contin   Potential benefits of a long term opioids use as well as potential risks (i.e. addiction risk, apnea etc) and complications (i.e. Somnolence, constipation and others) were explained to the patient and were aknowledged. Samantha [name] is giving [name] her pain meds      Memory loss     Walk more Valerian root for insomnia   Samantha [name] or [name] is giving [name] her pain meds; using a safe      History of cerebrovascular accident (CVA) due to ischemia    H/o CVA Off ASA due to GI bleed      HTN (hypertension) - Primary    Cont Benicar 20 mg/d NAS diet Walk more      Relevant Medications   olmesartan (BENICAR) 20 MG tablet   CKD (chronic kidney disease), stage III (HCC)    Hydrate well      Hemorrhage due to acute Sheria Lang lesion     Cameron ulcers - w/bleed - pending a robotic hiatal hernia repair with possible mesh with fundoplication/wrap w/Dr Andrey Campanile.       Relevant Medications   olmesartan (BENICAR) 20 MG tablet   Cameron lesion, acute     Cameron ulcers - w/bleed - pending a robotic hiatal hernia repair with possible mesh with fundoplication/wrap w/Dr Andrey Campanile.          No orders of the defined types were placed in this encounter.     Follow-up: Return in about 3 months (around 07/27/2023) for a follow-up visit.  Sonda Primes, MD

## 2023-04-26 NOTE — Assessment & Plan Note (Signed)
  Walk more Valerian root for insomnia   [name] or Samantha [name] is giving [name] her pain meds; using a safe

## 2023-04-26 NOTE — Assessment & Plan Note (Signed)
Cont Benicar 20 mg/d NAS diet Walk more 

## 2023-04-26 NOTE — Assessment & Plan Note (Signed)
H/o CVA Off ASA due to GI bleed

## 2023-05-01 ENCOUNTER — Other Ambulatory Visit: Payer: Self-pay | Admitting: *Deleted

## 2023-05-01 MED ORDER — ATORVASTATIN CALCIUM 20 MG PO TABS: 20.0000 mg | ORAL_TABLET | Freq: Every evening | ORAL | 3 refills | Status: DC

## 2023-05-01 MED ORDER — AMLODIPINE BESYLATE 2.5 MG PO TABS: 2.5000 mg | ORAL_TABLET | Freq: Every day | ORAL | 3 refills | Status: DC

## 2023-05-03 ENCOUNTER — Telehealth: Payer: Self-pay | Admitting: Internal Medicine

## 2023-05-03 ENCOUNTER — Ambulatory Visit (HOSPITAL_COMMUNITY)
Admission: RE | Admit: 2023-05-03 | Discharge: 2023-05-03 | Disposition: A | Discharge status: Home / Self Care | Payer: Medicare HMO | Attending: Internal Medicine | Admitting: Internal Medicine

## 2023-05-03 ENCOUNTER — Encounter (HOSPITAL_COMMUNITY)
Admission: RE | Disposition: A | Discharge status: Home / Self Care | Payer: Self-pay | Source: Home / Self Care | Attending: Internal Medicine

## 2023-05-03 DIAGNOSIS — K449 Diaphragmatic hernia without obstruction or gangrene: Secondary | ICD-10-CM

## 2023-05-03 DIAGNOSIS — R1310 Dysphagia, unspecified: Secondary | ICD-10-CM | POA: Diagnosis not present

## 2023-05-03 HISTORY — PX: ESOPHAGEAL MANOMETRY: SHX5429

## 2023-05-03 SURGERY — MANOMETRY, ESOPHAGUS

## 2023-05-03 MED ORDER — LIDOCAINE VISCOUS HCL 2 % MT SOLN
OROMUCOSAL | Status: AC
  Filled 2023-05-03: qty 15

## 2023-05-03 SURGICAL SUPPLY — 2 items
FACESHIELD LNG OPTICON STERILE (SAFETY) IMPLANT
GLOVE BIO SURGEON STRL SZ8 (GLOVE) ×2 IMPLANT

## 2023-05-03 NOTE — Telephone Encounter (Signed)
PT is scheduled for an EM today at [facility] 1pm. She ate cereal with milk, no coffee instead of toast and coffee. She wants to know if this will affect the procedure. Please advise.

## 2023-05-03 NOTE — Progress Notes (Signed)
Esophageal Manometry done per protocol. Patient tolerated well without distress or complication.   

## 2023-05-03 NOTE — Telephone Encounter (Signed)
Pt states she ate cereal with milk at 7am, she wants to know if she can still proceed with the E mano. Spoke with endo unit and she cannot have anything else by mouth and needs to arrive there at 1:30pm for a 2pm appt. Spoke with pt and she verbalized understanding. She states she will be there at 1:30pm.

## 2023-05-06 DIAGNOSIS — R1310 Dysphagia, unspecified: Secondary | ICD-10-CM

## 2023-05-07 NOTE — Progress Notes (Deleted)
Cardiology Office Note:    Date:  05/07/2023   ID:  Samantha [name], DOB 1965-12-21, MRN [id number]  PCP:  Tresa Garter, MD  Cardiologist:  None  Electrophysiologist:  None   Referring MD: Gaynelle Adu, MD   No chief complaint on file. ***  History of Present Illness:    Samantha [name] is a 56 y.o. female with a hx of CVA, OSA, hyperlipidemia, hydrocephalus, GI bleed who is referred by Dr. Andrey Campanile for evaluation of  Past Medical History:  Diagnosis Date   Anxiety    Sheria Lang lesion, acute    Cerebral ventricular shunt fitting or adjustment    CKD (chronic kidney disease), stage III (HCC)    COMMON MIGRAINE 10/01/2007   Chronic     Depression    Esophageal ulceration    GERD (gastroesophageal reflux disease)    GI bleed    GLUCOSE INTOLERANCE 10/01/2007   Qualifier: Diagnosis of  By: Jonny Ruiz MD, Len Blalock    Hiatal hernia    Hydrocephalus (HCC)    Hyperlipidemia    HYPERLIPIDEMIA 10/01/2007   Chronic. Lovaza is too $$$ Declined statins due to worries    IDA (iron deficiency anemia)    Memory loss    Migraine    OBSTRUCTIVE SLEEP APNEA 10/01/2007   NPSG 2006:  AHI 9/hr Started cpap 2009 successfully.      Paresthesia    Prolonged QT interval    Schatzki's ring    Sleep apnea    Stroke (HCC) 03/14/2013    Past Surgical History:  Procedure Laterality Date   Ames Shunt  1976,   cerebral vascular shunt/ with a revision 1981   BIOPSY  05/02/2022   Procedure: BIOPSY;  Surgeon: Imogene Burn, MD;  Location: [facility] ENDOSCOPY;  Service: Gastroenterology;;   CHOLECYSTECTOMY  1981   CYST REMOVAL NECK     ESOPHAGOGASTRODUODENOSCOPY (EGD) WITH PROPOFOL N/A 05/02/2022   Procedure: ESOPHAGOGASTRODUODENOSCOPY (EGD) WITH PROPOFOL;  Surgeon: Imogene Burn, MD;  Location: [facility] ENDOSCOPY;  Service: Gastroenterology;  Laterality: N/A;   ESOPHAGOGASTRODUODENOSCOPY (EGD) WITH PROPOFOL N/A 03/01/2023   Procedure: ESOPHAGOGASTRODUODENOSCOPY (EGD) WITH PROPOFOL;  Surgeon: Beverley Fiedler,  MD;  Location: [facility] ENDOSCOPY;  Service: Gastroenterology;  Laterality: N/A;   HEMOSTASIS CLIP PLACEMENT  03/01/2023   Procedure: HEMOSTASIS CLIP PLACEMENT;  Surgeon: Beverley Fiedler, MD;  Location: [facility] ENDOSCOPY;  Service: Gastroenterology;;   HEMOSTASIS CONTROL  03/01/2023   Procedure: HEMOSTASIS CONTROL;  Surgeon: Beverley Fiedler, MD;  Location: [facility] ENDOSCOPY;  Service: Gastroenterology;;   Pituitary cyst w/subsequent shunt      Current Medications: No outpatient medications have been marked as taking for the 05/10/23 encounter (Appointment) with Little Ishikawa, MD.     Allergies:   Alprazolam, Citalopram hydrobromide, and Olmesartan   Social History   Socioeconomic History   Marital status: Married    Spouse name: [name]   Number of children: 0   Years of education: 12   Highest education level: Not on file  Occupational History   Occupation: [employer]    Comment: Clinical biochemist Rep   Occupation: disabled  Tobacco Use   Smoking status: Never   Smokeless tobacco: Never  Vaping Use   Vaping Use: Never used  Substance and Sexual Activity   Alcohol use: Yes    Alcohol/week: 1.0 standard drink of alcohol    Types: 1 Glasses of wine per week    Comment: Social   Drug use: No   Sexual activity: Yes  Other Topics Concern   Not on file  Social History Narrative   Mother is in a NH.    Patient lives at home with her husband [name]). 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    High school education.   Right handed.   Caffeine 1 cup daily.   She has no children   Social Determinants of Health   Financial Resource Strain: Low Risk  (09/05/2022)   Overall Financial Resource Strain (CARDIA)    Difficulty of Paying Living Expenses: Not hard at all  Food Insecurity: No Food Insecurity (03/06/2023)   Hunger Vital Sign    Worried About Running Out of Food in the Last Year: Never true    Ran Out of Food in the Last Year: Never true  Transportation Needs: No Transportation  Needs (03/06/2023)   PRAPARE - Administrator, Civil Service (Medical): No    Lack of Transportation (Non-Medical): No  Physical Activity: Inactive (09/05/2022)   Exercise Vital Sign    Days of Exercise per Week: 0 days    Minutes of Exercise per Session: 0 min  Stress: No Stress Concern Present (09/05/2022)   [employer] of Occupational Health - Occupational Stress Questionnaire    Feeling of Stress : Not at all  Social Connections: Moderately Isolated (09/05/2022)   Social Connection and Isolation Panel [NHANES]    Frequency of Communication with Friends and Family: More than three times a week    Frequency of Social Gatherings with Friends and Family: More than three times a week    Attends Religious Services: Never    Database administrator or Organizations: No    Attends Engineer, structural: Never    Marital Status: Married     Family History: The patient's ***family history includes Alzheimer's disease in her mother and paternal grandmother; Brain cancer in her maternal aunt; Colon polyps in her mother; Dementia in her mother; Diabetes in her father; Esophageal cancer in her maternal grandmother; Heart disease in her father; Kidney cancer in her mother; Lung cancer in her maternal uncle and other family members; Melanoma in an other family member; Migraines in her mother. There is no history of Colon cancer, Rectal cancer, or Stomach cancer.  ROS:   Please see the history of present illness.    *** All other systems reviewed and are negative.  EKGs/Labs/Other Studies Reviewed:    The following studies were reviewed today: ***  EKG:  EKG is *** ordered today.  The ekg ordered today demonstrates ***  Recent Labs: 02/28/2023: B Natriuretic Peptide 19.3 03/03/2023: Magnesium 1.6 03/14/2023: ALT 14; BUN 11; Creatinine, Ser 1.00; Potassium 3.7; Sodium 140 04/25/2023: Hemoglobin 12.8; Platelets 436.0  Recent Lipid Panel    Component Value Date/Time    CHOL 217 (H) 09/02/2021 1131   TRIG 204.0 (H) 09/02/2021 1131   TRIG 136 11/22/2006 0728   HDL 52.80 09/02/2021 1131   CHOLHDL 4 09/02/2021 1131   VLDL 40.8 (H) 09/02/2021 1131   LDLCALC 85 07/16/2020 1300   LDLDIRECT 120.0 09/02/2021 1131    Physical Exam:    VS:  LMP 11/24/2016     Wt Readings from Last 3 Encounters:  04/26/23 140 lb (63.5 kg)  03/27/23 143 lb (64.9 kg)  03/14/23 146 lb 6 oz (66.4 kg)     GEN: *** Well nourished, well developed in no acute distress HEENT: Normal NECK: No JVD; No carotid bruits LYMPHATICS: No lymphadenopathy CARDIAC: ***RRR, no murmurs, rubs, gallops  RESPIRATORY:  Clear to auscultation without rales, wheezing or rhonchi  ABDOMEN: Soft, non-tender, non-distended MUSCULOSKELETAL:  No edema; No deformity  SKIN: Warm and dry NEUROLOGIC:  Alert and oriented x 3 PSYCHIATRIC:  Normal affect   ASSESSMENT:    No diagnosis found. PLAN:    Preop evaluation:  CVA: Has been off aspirin due to GI bleeding history.  Continue atorvastatin  Hypertension: On amlodipine 2.5 mg daily and olmesartan 20 mg daily  Hyperlipidemia: On atorvastatin 20 mg daily  RTC in***  Medication Adjustments/Labs and Tests Ordered: Current medicines are reviewed at length with the patient today.  Concerns regarding medicines are outlined above.  No orders of the defined types were placed in this encounter.  No orders of the defined types were placed in this encounter.   There are no Patient Instructions on file for this visit.   Signed, Little Ishikawa, MD  05/07/2023 10:37 PM    [facility]

## 2023-05-08 ENCOUNTER — Encounter (HOSPITAL_COMMUNITY): Payer: Self-pay | Admitting: Internal Medicine

## 2023-05-10 ENCOUNTER — Ambulatory Visit: Payer: Medicare HMO | Admitting: Cardiology

## 2023-05-12 ENCOUNTER — Ambulatory Visit: Payer: Medicare HMO | Admitting: Family Medicine

## 2023-05-12 ENCOUNTER — Telehealth: Payer: Self-pay | Admitting: Internal Medicine

## 2023-05-12 NOTE — Telephone Encounter (Signed)
Patient called and decided that she needs to go back to 3 a day on her gabapentin - this is what she use to take.  Please send to [facility].

## 2023-05-12 NOTE — Telephone Encounter (Signed)
Is this ok pls advise.Marland KitchenRaechel Chute

## 2023-05-14 MED ORDER — GABAPENTIN 600 MG PO TABS: ORAL_TABLET | ORAL | 5 refills | Status: DC

## 2023-05-14 NOTE — Telephone Encounter (Signed)
Okay.  Thanks.

## 2023-05-15 NOTE — Telephone Encounter (Signed)
[facility].Marland KitchenRaechel Chute

## 2023-05-19 ENCOUNTER — Telehealth: Payer: Self-pay | Admitting: Internal Medicine
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
## 2023-05-22 NOTE — Telephone Encounter (Signed)
Pt has appt w/ MD on 05/24/23 will discuss then.Samantha KitchenRaechel Chute

## 2023-05-24 ENCOUNTER — Ambulatory Visit (INDEPENDENT_AMBULATORY_CARE_PROVIDER_SITE_OTHER): Payer: Medicare HMO | Admitting: Internal Medicine

## 2023-05-24 ENCOUNTER — Encounter: Payer: Self-pay | Admitting: Internal Medicine

## 2023-05-24 VITALS — BP 160/118 | HR 95 | Temp 98.7°F | Ht <= 58 in | Wt 141.0 lb

## 2023-05-24 DIAGNOSIS — F4323 Adjustment disorder with mixed anxiety and depressed mood: Secondary | ICD-10-CM | POA: Diagnosis not present

## 2023-05-24 DIAGNOSIS — G8929 Other chronic pain: Secondary | ICD-10-CM | POA: Diagnosis not present

## 2023-05-24 DIAGNOSIS — I10 Essential (primary) hypertension: Secondary | ICD-10-CM

## 2023-05-24 MED ORDER — OXYCODONE HCL 10 MG PO TABS: 10.0000 mg | ORAL_TABLET | Freq: Three times a day (TID) | ORAL | 0 refills | Status: DC | PRN

## 2023-05-24 MED ORDER — MORPHINE SULFATE ER 30 MG PO TBCR: 30.0000 mg | EXTENDED_RELEASE_TABLET | Freq: Two times a day (BID) | ORAL | 0 refills | Status: DC
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
## 2023-05-25 ENCOUNTER — Encounter: Payer: Self-pay | Admitting: Internal Medicine

## 2023-05-25 ENCOUNTER — Telehealth: Payer: Self-pay | Admitting: Internal Medicine

## 2023-05-25 NOTE — Telephone Encounter (Signed)
Needs to discuss medications that were discussed yesterday.  Please call patient:  [phone]

## 2023-05-25 NOTE — Telephone Encounter (Signed)
Pt also called in.Marland KitchenMarland KitchenNeeds to discuss medications that were discussed yesterday.  Please call patient:  [phone]
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
## 2023-06-02 ENCOUNTER — Telehealth: Payer: Self-pay | Admitting: Internal Medicine

## 2023-06-02 DIAGNOSIS — D509 Iron deficiency anemia, unspecified: Secondary | ICD-10-CM

## 2023-06-02 DIAGNOSIS — R5383 Other fatigue: Secondary | ICD-10-CM

## 2023-06-02 DIAGNOSIS — K253 Acute gastric ulcer without hemorrhage or perforation: Secondary | ICD-10-CM

## 2023-06-02 NOTE — Telephone Encounter (Signed)
Okay to check CBC and ferritin/IBC. Please have her also reach out to her PCP

## 2023-06-02 NOTE — Telephone Encounter (Signed)
Dr. Leonides Schanz as DOD PM of 06/02/23   Returned call to patient. Pt states that she has been feeling weak and tired for about a week. At first pt stated that she felt lethargic, "like she had been hit by a truck". Pt has a history of IDA and cameron lesion. Pt wants CBC to see if her Hgb has dropped and if her ulcer may be bleeding. Pt denied any overt signs of bleeding. Pt has continued taking iron daily. Please advise, thanks.

## 2023-06-02 NOTE — Telephone Encounter (Signed)
Pt very fatigued, sleeping for 12-14 hours a day. Pt is requesting a call from Dr. Loren Racer nurse to have her hemoglobin orders put in.   Please call:  [phone]

## 2023-06-02 NOTE — Telephone Encounter (Signed)
Noted../lmb 

## 2023-06-02 NOTE — Telephone Encounter (Signed)
Called and spoke with patient. Pt states that she will come by for labs on Monday since she is not feeling the best today. Pt also already reached out to her PCP. Pt had no concerns at the ned of the call.   Lab orders in epic.

## 2023-06-02 NOTE — Telephone Encounter (Signed)
Disregard, pt called back and will calling her specialist for this issue.

## 2023-06-02 NOTE — Telephone Encounter (Signed)
PT is having issues with staying awake, she's very tired and weak. She is wondering if she needs to have her hemoglobin checked. She does have a bleeding ulcer and concerned it is getting worse. Requesting call back.

## 2023-06-03 ENCOUNTER — Other Ambulatory Visit: Payer: Self-pay | Admitting: Internal Medicine

## 2023-06-05 ENCOUNTER — Other Ambulatory Visit (INDEPENDENT_AMBULATORY_CARE_PROVIDER_SITE_OTHER): Payer: Medicare HMO

## 2023-06-05 DIAGNOSIS — R5383 Other fatigue: Secondary | ICD-10-CM | POA: Diagnosis not present

## 2023-06-05 DIAGNOSIS — K253 Acute gastric ulcer without hemorrhage or perforation: Secondary | ICD-10-CM

## 2023-06-05 DIAGNOSIS — D509 Iron deficiency anemia, unspecified: Secondary | ICD-10-CM

## 2023-06-05 LAB — CBC WITH DIFFERENTIAL/PLATELET
Basophils Absolute: 0 10*3/uL (ref 0.0–0.1)
Basophils Relative: 0.6 % (ref 0.0–3.0)
Eosinophils Absolute: 0.1 10*3/uL (ref 0.0–0.7)
Eosinophils Relative: 2.3 % (ref 0.0–5.0)
HCT: 37.6 % (ref 36.0–46.0)
Hemoglobin: 12.4 g/dL (ref 12.0–15.0)
Lymphocytes Relative: 37.5 % (ref 12.0–46.0)
Lymphs Abs: 2.3 10*3/uL (ref 0.7–4.0)
MCHC: 32.9 g/dL (ref 30.0–36.0)
MCV: 95.3 fl (ref 78.0–100.0)
Monocytes Absolute: 0.5 10*3/uL (ref 0.1–1.0)
Monocytes Relative: 8.1 % (ref 3.0–12.0)
Neutro Abs: 3.2 10*3/uL (ref 1.4–7.7)
Neutrophils Relative %: 51.5 % (ref 43.0–77.0)
Platelets: 397 10*3/uL (ref 150.0–400.0)
RBC: 3.94 Mil/uL (ref 3.87–5.11)
RDW: 13 % (ref 11.5–15.5)
WBC: 6.2 10*3/uL (ref 4.0–10.5)

## 2023-06-05 LAB — IBC + FERRITIN
Ferritin: 27.8 ng/mL (ref 10.0–291.0)
Iron: 107 ug/dL (ref 42–145)
Saturation Ratios: 31.8 % (ref 20.0–50.0)
TIBC: 336 ug/dL (ref 250.0–450.0)
Transferrin: 240 mg/dL (ref 212.0–360.0)

## 2023-06-12 ENCOUNTER — Telehealth: Payer: Self-pay | Admitting: Internal Medicine

## 2023-06-12 MED ORDER — GABAPENTIN 600 MG PO TABS: ORAL_TABLET | ORAL | 1 refills | Status: DC

## 2023-06-12 NOTE — Telephone Encounter (Signed)
Sent rx to [facility].Marland KitchenRaechel Chute

## 2023-06-12 NOTE — Telephone Encounter (Signed)
Patient called and said that she is tolerating the gabapentin and would like a permanent script sent to her mail order pharmacy.  [facility]

## 2023-06-15 DIAGNOSIS — M5116 Intervertebral disc disorders with radiculopathy, lumbar region: Secondary | ICD-10-CM | POA: Diagnosis not present

## 2023-06-15 DIAGNOSIS — M47816 Spondylosis without myelopathy or radiculopathy, lumbar region: Secondary | ICD-10-CM | POA: Diagnosis not present

## 2023-06-15 DIAGNOSIS — M7061 Trochanteric bursitis, right hip: Secondary | ICD-10-CM | POA: Diagnosis not present

## 2023-06-19 ENCOUNTER — Other Ambulatory Visit: Payer: Self-pay | Admitting: Internal Medicine

## 2023-06-22 ENCOUNTER — Ambulatory Visit (INDEPENDENT_AMBULATORY_CARE_PROVIDER_SITE_OTHER): Payer: Medicare HMO | Admitting: Internal Medicine

## 2023-06-22 ENCOUNTER — Encounter: Payer: Self-pay | Admitting: Internal Medicine

## 2023-06-22 VITALS — BP 130/70 | HR 71 | Temp 98.2°F | Ht <= 58 in | Wt 143.0 lb

## 2023-06-22 DIAGNOSIS — G8929 Other chronic pain: Secondary | ICD-10-CM | POA: Diagnosis not present

## 2023-06-22 DIAGNOSIS — I10 Essential (primary) hypertension: Secondary | ICD-10-CM | POA: Diagnosis not present

## 2023-06-22 MED ORDER — MORPHINE SULFATE ER 30 MG PO TBCR: 30.0000 mg | EXTENDED_RELEASE_TABLET | Freq: Two times a day (BID) | ORAL | 0 refills | Status: DC

## 2023-06-22 MED ORDER — OXYCODONE HCL 10 MG PO TABS: 10.0000 mg | ORAL_TABLET | Freq: Three times a day (TID) | ORAL | 0 refills | Status: DC | PRN
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
## 2023-06-22 NOTE — Assessment & Plan Note (Signed)
Elevated BP/HR - on - resolved
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
## 2023-06-29 ENCOUNTER — Encounter (INDEPENDENT_AMBULATORY_CARE_PROVIDER_SITE_OTHER): Payer: Self-pay

## 2023-07-02 ENCOUNTER — Encounter: Payer: Self-pay | Admitting: Internal Medicine

## 2023-07-05 ENCOUNTER — Encounter: Payer: Self-pay | Admitting: Family Medicine

## 2023-07-05 ENCOUNTER — Telehealth (INDEPENDENT_AMBULATORY_CARE_PROVIDER_SITE_OTHER): Payer: Medicare HMO | Admitting: Family Medicine

## 2023-07-05 ENCOUNTER — Ambulatory Visit: Payer: Medicare HMO | Admitting: Cardiology

## 2023-07-05 VITALS — Temp 99.0°F

## 2023-07-05 DIAGNOSIS — U071 COVID-19: Secondary | ICD-10-CM | POA: Diagnosis not present

## 2023-07-05 MED ORDER — NIRMATRELVIR/RITONAVIR (PAXLOVID)TABLET
3.0000 | ORAL_TABLET | Freq: Two times a day (BID) | ORAL | 0 refills | Status: AC
Start: 2023-07-05 — End: 2023-07-10

## 2023-07-05 NOTE — Progress Notes (Signed)
Virtual Medical Office Visit  Patient:  Samantha [name]      Age: 57 y.o.       Sex:  female  Date:   07/05/2023  PCP:    Tresa Garter, MD   Today's Healthcare Provider: Karie Georges, MD    Assessment/Plan:   Summary assessment:  Trudy was seen today for covid positive, chest pain, nasal congestion, sore throat, cough and headache.  COVID-19 -     nirmatrelvir/ritonavir; Take 3 tablets by mouth 2 (two) times daily for 5 days. (Take nirmatrelvir 150 mg two tablets twice daily for 5 days and ritonavir 100 mg one tablet twice daily for 5 days) Patient GFR is 61  Dispense: 30 tablet; Refill: 0   I advised the patient that she is a candidate for Paxlovid therapy. I extensively reviewed her medication and advised that she stop the atorvastatin while she is taking the Paxlovid. I also advised here of a potential drug-drug interaction between Paxlovid and oxycodone, that it could enhance the effects of the oxycodone and to be wary of this if she takes them simultaneously. Pt voiced understanding and I have sent the rx. I advised that if her sx worsened or she felt SOB that she should go to the urgent care for advanced level of care.   No follow-ups on file.   She was advised to call the office or go to ER if her condition worsens    Subjective:   Samantha [name] is a 57 y.o. female with PMH significant for: Past Medical History:  Diagnosis Date   Anxiety    Sheria Lang lesion, acute    Cerebral ventricular shunt fitting or adjustment    CKD (chronic kidney disease), stage III (HCC)    COMMON MIGRAINE 10/01/2007   Chronic     Depression    Esophageal ulceration    GERD (gastroesophageal reflux disease)    GI bleed    GLUCOSE INTOLERANCE 10/01/2007   Qualifier: Diagnosis of  By: Jonny Ruiz MD, Len Blalock    Hiatal hernia    Hydrocephalus (HCC)    Hyperlipidemia    HYPERLIPIDEMIA 10/01/2007   Chronic. Lovaza is too $$$ Declined statins due to worries    IDA (iron deficiency  anemia)    Memory loss    Migraine    OBSTRUCTIVE SLEEP APNEA 10/01/2007   NPSG 2006:  AHI 9/hr Started cpap 2009 successfully.      Paresthesia    Prolonged QT interval    Schatzki's ring    Sleep apnea    Stroke (HCC) 03/14/2013     Presenting today with: Chief Complaint  Patient presents with   Covid Positive    Patient states the home Covid test was positive yesterday   Chest Pain    Patient complains of chest pain x1 day   Nasal Congestion    X1 day   Sore Throat    X1 day   Cough    Noted this morning with white sputum, taking Mucinex   Headache    X1 day, taking Tylenol with no relief     She clarifies and reports that her condition: Patient reports that she was exposed to a person with COVID about 3-4 days ago. States that she started with symptoms about 1 1/2 days ago. States that her test at home was positive yesterday. Has been having chest pain --very achy and she is having sore throat, lethargy. Some coughing but no much production. Is taking mucinex  and tylenol for the headache.  Does have a fever today -- 101.7 -- I watched her take her temperature at home.  Is reporting some dizziness, feels like she is very drained.   She denies having any: SOB or difficulty breathing.           Objective/Observations  Physical Exam:  Polite and friendly Gen: NAD, resting comfortably Pulm: Normal work of breathing Neuro: Grossly normal, moves all extremities Psych: Normal affect and thought content Problem specific physical exam findings:    No images are attached to the encounter or orders placed in the encounter.    Results: No results found for any visits on 07/05/23.        Virtual Visit via Video   I connected with [name] on 07/05/23 at  9:00 AM EDT by a video enabled telemedicine application and verified that I am speaking with the correct person using two identifiers. The limitations of evaluation and management by telemedicine and the  availability of in person appointments were discussed. The patient expressed understanding and agreed to proceed.   Percentage of appointment time on video:  100% Patient location: Home Provider location: [facility] Persons participating in the virtual visit: Myself and Patient

## 2023-07-07 ENCOUNTER — Institutional Professional Consult (permissible substitution): Payer: Medicare HMO | Admitting: Primary Care

## 2023-07-18 ENCOUNTER — Ambulatory Visit: Payer: Medicare HMO | Admitting: Cardiology

## 2023-07-20 ENCOUNTER — Other Ambulatory Visit: Payer: Self-pay | Admitting: Internal Medicine

## 2023-07-24 ENCOUNTER — Telehealth: Payer: Self-pay | Admitting: Internal Medicine

## 2023-07-24 DIAGNOSIS — M5116 Intervertebral disc disorders with radiculopathy, lumbar region: Secondary | ICD-10-CM | POA: Diagnosis not present

## 2023-07-24 NOTE — Telephone Encounter (Signed)
Patient has a virtual visit tomorrow for possible Covid. Her sister [name] called and asked to speak with a nurse prior to that appointment. She said she usually comes to patient's appointments with her. She said it was regarding her medications. Best callback for [name] is [phone].

## 2023-07-25 ENCOUNTER — Telehealth (INDEPENDENT_AMBULATORY_CARE_PROVIDER_SITE_OTHER): Payer: Medicare HMO | Admitting: Internal Medicine

## 2023-07-25 ENCOUNTER — Encounter: Payer: Self-pay | Admitting: Internal Medicine

## 2023-07-25 ENCOUNTER — Institutional Professional Consult (permissible substitution): Payer: Medicare HMO | Admitting: Primary Care

## 2023-07-25 DIAGNOSIS — R5382 Chronic fatigue, unspecified: Secondary | ICD-10-CM | POA: Diagnosis not present

## 2023-07-25 DIAGNOSIS — G8929 Other chronic pain: Secondary | ICD-10-CM | POA: Diagnosis not present

## 2023-07-25 MED ORDER — MORPHINE SULFATE ER 30 MG PO TBCR: 30.0000 mg | EXTENDED_RELEASE_TABLET | Freq: Two times a day (BID) | ORAL | 0 refills | Status: DC

## 2023-07-25 MED ORDER — OXYCODONE HCL 10 MG PO TABS: 10.0000 mg | ORAL_TABLET | Freq: Three times a day (TID) | ORAL | 0 refills | Status: DC | PRN

## 2023-07-25 NOTE — Telephone Encounter (Signed)
Tried calling pts sister. However, phone is giving busy signal at this time.

## 2023-07-25 NOTE — Assessment & Plan Note (Signed)
Samantha [name] started to exercise - tired Slow down if needed
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
## 2023-07-27 DIAGNOSIS — Z124 Encounter for screening for malignant neoplasm of cervix: Secondary | ICD-10-CM | POA: Diagnosis not present

## 2023-07-27 DIAGNOSIS — Z01419 Encounter for gynecological examination (general) (routine) without abnormal findings: Secondary | ICD-10-CM | POA: Diagnosis not present

## 2023-07-27 DIAGNOSIS — Z1231 Encounter for screening mammogram for malignant neoplasm of breast: Secondary | ICD-10-CM | POA: Diagnosis not present

## 2023-07-27 LAB — HM MAMMOGRAPHY

## 2023-07-28 ENCOUNTER — Other Ambulatory Visit: Payer: Self-pay | Admitting: Internal Medicine

## 2023-07-29 ENCOUNTER — Other Ambulatory Visit: Payer: Self-pay | Admitting: Internal Medicine

## 2023-08-15 ENCOUNTER — Telehealth: Payer: Self-pay | Admitting: Internal Medicine

## 2023-08-15 ENCOUNTER — Ambulatory Visit (INDEPENDENT_AMBULATORY_CARE_PROVIDER_SITE_OTHER): Payer: Medicare HMO | Admitting: Internal Medicine

## 2023-08-15 ENCOUNTER — Encounter: Payer: Self-pay | Admitting: Internal Medicine

## 2023-08-15 VITALS — BP 150/90 | HR 72 | Temp 99.0°F | Ht <= 58 in | Wt 141.0 lb

## 2023-08-15 DIAGNOSIS — E162 Hypoglycemia, unspecified: Secondary | ICD-10-CM

## 2023-08-15 DIAGNOSIS — R5382 Chronic fatigue, unspecified: Secondary | ICD-10-CM | POA: Diagnosis not present

## 2023-08-15 DIAGNOSIS — Z982 Presence of cerebrospinal fluid drainage device: Secondary | ICD-10-CM

## 2023-08-15 DIAGNOSIS — R413 Other amnesia: Secondary | ICD-10-CM

## 2023-08-15 DIAGNOSIS — G8929 Other chronic pain: Secondary | ICD-10-CM | POA: Diagnosis not present

## 2023-08-15 MED ORDER — LIDOCAINE 5 % EX PTCH: 1.0000 | MEDICATED_PATCH | CUTANEOUS | 1 refills | Status: DC

## 2023-08-15 MED ORDER — OXYCODONE HCL 10 MG PO TABS: 10.0000 mg | ORAL_TABLET | Freq: Three times a day (TID) | ORAL | 0 refills | Status: DC | PRN

## 2023-08-15 NOTE — Assessment & Plan Note (Signed)
Monitor A1c, diet

## 2023-08-15 NOTE — Telephone Encounter (Signed)
Patient also needs her morphine sent in to her pharmacy -it is not due until the 10th but she said he normally sends this in.
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
## 2023-08-15 NOTE — Assessment & Plan Note (Signed)
Walk more Valerian root for insomnia Mark or [name] are giving Samantha [name] her pain meds; using a safe
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
## 2023-08-15 NOTE — Assessment & Plan Note (Signed)
Samantha [name] needs to exercise

## 2023-08-16 ENCOUNTER — Institutional Professional Consult (permissible substitution): Payer: Medicare HMO | Admitting: Primary Care

## 2023-08-17 NOTE — Telephone Encounter (Signed)
We cannot feel controlled substances early.  Thanks

## 2023-08-22 DIAGNOSIS — M1711 Unilateral primary osteoarthritis, right knee: Secondary | ICD-10-CM | POA: Diagnosis not present

## 2023-08-22 NOTE — Telephone Encounter (Signed)
Patient called back and said that [facility] does not have the morphine.  Please send the morphine prescription to [facility] on [address].

## 2023-08-24 ENCOUNTER — Encounter: Payer: Self-pay | Admitting: Internal Medicine

## 2023-08-24 NOTE — Telephone Encounter (Signed)
Patient would like to know if her morphine (MS CONTIN) 30 MG 12 hr tablet can be sent in today since it is due today. Best callback is [phone].

## 2023-08-25 MED ORDER — MORPHINE SULFATE ER 30 MG PO TBCR: 30.0000 mg | EXTENDED_RELEASE_TABLET | Freq: Two times a day (BID) | ORAL | 0 refills | Status: DC

## 2023-08-25 NOTE — Telephone Encounter (Signed)
Okay.  Thanks.

## 2023-08-29 ENCOUNTER — Other Ambulatory Visit: Payer: Self-pay

## 2023-08-29 DIAGNOSIS — H5213 Myopia, bilateral: Secondary | ICD-10-CM | POA: Diagnosis not present

## 2023-08-29 MED ORDER — ACCRUFER 30 MG PO CAPS: 30.0000 mg | ORAL_CAPSULE | Freq: Two times a day (BID) | ORAL | 2 refills | Status: DC

## 2023-09-06 ENCOUNTER — Ambulatory Visit (INDEPENDENT_AMBULATORY_CARE_PROVIDER_SITE_OTHER): Payer: Medicare HMO

## 2023-09-06 ENCOUNTER — Encounter: Payer: Self-pay | Admitting: Cardiology

## 2023-09-06 ENCOUNTER — Ambulatory Visit: Payer: Medicare HMO | Attending: Cardiology | Admitting: Cardiology

## 2023-09-06 VITALS — BP 130/90 | HR 71 | Ht <= 58 in | Wt 145.2 lb

## 2023-09-06 DIAGNOSIS — Z01812 Encounter for preprocedural laboratory examination: Secondary | ICD-10-CM | POA: Diagnosis not present

## 2023-09-06 DIAGNOSIS — R42 Dizziness and giddiness: Secondary | ICD-10-CM

## 2023-09-06 DIAGNOSIS — I10 Essential (primary) hypertension: Secondary | ICD-10-CM

## 2023-09-06 DIAGNOSIS — Z01810 Encounter for preprocedural cardiovascular examination: Secondary | ICD-10-CM | POA: Diagnosis not present

## 2023-09-06 DIAGNOSIS — R9431 Abnormal electrocardiogram [ECG] [EKG]: Secondary | ICD-10-CM

## 2023-09-06 MED ORDER — METOPROLOL TARTRATE 100 MG PO TABS: ORAL_TABLET | ORAL | 0 refills | Status: DC

## 2023-09-06 NOTE — Progress Notes (Signed)
Cardiology Office Note:    Date:  09/06/2023   ID:  Samantha [name], DOB January 29, 1966, MRN [id number]  PCP:  Samantha Garter, MD  Cardiologist:  Samantha Ripple, DO  Electrophysiologist:  None   Referring MD: Samantha Adu, MD   " I am ok"  History of Present Illness:    Samantha [name] is a 57 y.o. female with history of hiatal hernia and an ulcer, was referred to cardiology for preoperative clearance before undergoing surgery. The patient has been experiencing dizziness, particularly upon waking and before going to bed, for approximately two weeks. The onset of these symptoms coincides with a recent change in blood pressure medication from Olmesartan to Amlodipine. The patient denies any chest pain. The patient has a significant family history of heart disease, with both parents having suffered heart attacks. The patient's father had his first heart attack at the age of 57 and died from a massive heart attack at the age of 22. The patient's mother's father also died from a heart attack. The patient's EKG shows abnormal results, with flipped T waves in the anterior leads, suggesting potential strain on the heart muscle.   Past Medical History:  Diagnosis Date   Anxiety    Sheria Lang lesion, acute    Cerebral ventricular shunt fitting or adjustment    CKD (chronic kidney disease), stage III (HCC)    COMMON MIGRAINE 10/01/2007   Chronic     Depression    Esophageal ulceration    GERD (gastroesophageal reflux disease)    GI bleed    GLUCOSE INTOLERANCE 10/01/2007   Qualifier: Diagnosis of  By: Jonny Ruiz MD, Len Blalock    Hiatal hernia    Hydrocephalus (HCC)    Hyperlipidemia    HYPERLIPIDEMIA 10/01/2007   Chronic. Lovaza is too $$$ Declined statins due to worries    IDA (iron deficiency anemia)    Memory loss    Migraine    OBSTRUCTIVE SLEEP APNEA 10/01/2007   NPSG 2006:  AHI 9/hr Started cpap 2009 successfully.      Paresthesia    Prolonged QT interval    Schatzki's ring    Sleep  apnea    Stroke (HCC) 03/14/2013    Past Surgical History:  Procedure Laterality Date   Ames Shunt  1976,   cerebral vascular shunt/ with a revision 1981   BIOPSY  05/02/2022   Procedure: BIOPSY;  Surgeon: Imogene Burn, MD;  [facility] ENDOSCOPY;  Service: Gastroenterology;;   CHOLECYSTECTOMY  1981   CYST REMOVAL NECK     ESOPHAGEAL MANOMETRY N/A 05/03/2023   Procedure: ESOPHAGEAL MANOMETRY (EM);  Surgeon: Beverley Fiedler, MD;  Location: [facility] ENDOSCOPY;  Service: Gastroenterology;  Laterality: N/A;   ESOPHAGOGASTRODUODENOSCOPY (EGD) WITH PROPOFOL N/A 05/02/2022   Procedure: ESOPHAGOGASTRODUODENOSCOPY (EGD) WITH PROPOFOL;  Surgeon: Imogene Burn, MD;  Location: [facility] ENDOSCOPY;  Service: Gastroenterology;  Laterality: N/A;   ESOPHAGOGASTRODUODENOSCOPY (EGD) WITH PROPOFOL N/A 03/01/2023   Procedure: ESOPHAGOGASTRODUODENOSCOPY (EGD) WITH PROPOFOL;  Surgeon: Beverley Fiedler, MD;  Location: [facility] ENDOSCOPY;  Service: Gastroenterology;  Laterality: N/A;   HEMOSTASIS CLIP PLACEMENT  03/01/2023   Procedure: HEMOSTASIS CLIP PLACEMENT;  Surgeon: Beverley Fiedler, MD;  Location: [facility] ENDOSCOPY;  Service: Gastroenterology;;   HEMOSTASIS CONTROL  03/01/2023   Procedure: HEMOSTASIS CONTROL;  Surgeon: Beverley Fiedler, MD;  Location: [facility] ENDOSCOPY;  Service: Gastroenterology;;   Pituitary cyst w/subsequent shunt      Current Medications: Current Meds  Medication Sig   amLODipine (NORVASC) 2.5 MG  tablet Take 1 tablet (2.5 mg total) by mouth daily.   atorvastatin (LIPITOR) 20 MG tablet TAKE 1 TABLET EVERY DAY AT 6 PM   busPIRone (BUSPAR) 10 MG tablet TAKE 1 TABLET THREE TIMES DAILY (Patient taking differently: Take 10 mg by mouth 3 (three) times daily.)   Calcium Carb-Cholecalciferol (CALCIUM+D3 PO) Take 1 tablet by mouth 2 (two) times daily with a meal.   Docusate Calcium (STOOL SOFTENER PO) Take 1 capsule by mouth 2 (two) times daily as needed (for constipation).   donepezil (ARICEPT) 10 MG tablet TAKE 1 TABLET AT  BEDTIME   DULoxetine (CYMBALTA) 60 MG capsule TAKE 1 CAPSULE EVERY DAY (Patient taking differently: Take 60 mg by mouth at bedtime.)   famotidine (PEPCID) 40 MG tablet TAKE 1 TABLET EVERY DAY (Patient taking differently: Take 40 mg by mouth daily as needed for heartburn or indigestion (if Protonix is ineffective).)   Ferric Maltol (ACCRUFER) 30 MG CAPS Take 1 capsule (30 mg total) by mouth 2 (two) times daily.   gabapentin (NEURONTIN) 600 MG tablet Take 1 tablet 3 times a day   lactulose (CHRONULAC) 10 GM/15ML solution Take 30-45 mLs (20-30 g total) by mouth 2 (two) times daily as needed for moderate constipation or severe constipation.   lamoTRIgine (LAMICTAL) 100 MG tablet TAKE 1 TABLET EVERY MORNING, AND 1 AND 1/2 TABLETS AT NIGHT. (Patient taking differently: Take 100-150 mg by mouth See admin instructions. Take 100 mg by mouth in the morning and 150 mg at bedtime)   lidocaine (LIDODERM) 5 % Place 1-2 patches onto the skin daily. Remove & Discard patch within 12 hours or as directed by MD   Lidocaine HCl (ASPERCREME LIDOCAINE ESSENTIAL) 4 % LIQD Apply 1 application  topically 2 (two) times daily as needed (to painful sites).   LINZESS 290 MCG CAPS capsule TAKE 1 CAPSULE DAILY BEFORE BREAKFAST   memantine (NAMENDA) 5 MG tablet TAKE 1 TABLET TWICE DAILY   metoprolol tartrate (LOPRESSOR) 100 MG tablet Take 2 hours prior to CT   morphine (MS CONTIN) 30 MG 12 hr tablet Take 1 tablet (30 mg total) by mouth every 12 (twelve) hours.   olmesartan (BENICAR) 20 MG tablet Take 20 mg by mouth daily.   ondansetron (ZOFRAN) 4 MG tablet TAKE 1 TABLET EVERY 8 HOURS AS NEEDED FOR NAUSEA OR VOMITING.   Oxycodone HCl 10 MG TABS Take 1 tablet (10 mg total) by mouth 3 (three) times daily as needed.   Probiotic Product (ALIGN) 4 MG CAPS Take 1 capsule (4 mg total) by mouth daily. (Patient taking differently: Take 4 mg by mouth daily as needed (for stomach or constipation issues).)   promethazine (PHENERGAN) 12.5 MG  tablet Take 1 tablet (12.5 mg total) by mouth every 6 (six) hours as needed for nausea or vomiting.     Allergies:   Alprazolam, Citalopram hydrobromide, and Olmesartan   Social History   Socioeconomic History   Marital status: Married    Spouse name: [name]   Number of children: 0   Years of education: 12   Highest education level: GED or equivalent  Occupational History   Occupation: [employer]    Comment: Clinical biochemist Rep   Occupation: disabled  Tobacco Use   Smoking status: Never   Smokeless tobacco: Never  Vaping Use   Vaping status: Never Used  Substance and Sexual Activity   Alcohol use: Yes    Alcohol/week: 1.0 standard drink of alcohol    Types: 1 Glasses of wine per week  Comment: Social   Drug use: No   Sexual activity: Yes  Other Topics Concern   Not on file  Social History Narrative   Mother is in a NH.    Patient lives at home with her husband [name]). 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    High school education.   Right handed.   Caffeine 1 cup daily.   She has no children   Social Determinants of Health   Financial Resource Strain: Low Risk  (05/20/2023)   Overall Financial Resource Strain (CARDIA)    Difficulty of Paying Living Expenses: Not hard at all  Food Insecurity: No Food Insecurity (05/20/2023)   Hunger Vital Sign    Worried About Running Out of Food in the Last Year: Never true    Ran Out of Food in the Last Year: Never true  Transportation Needs: No Transportation Needs (05/20/2023)   PRAPARE - Administrator, Civil Service (Medical): No    Lack of Transportation (Non-Medical): No  Physical Activity: Inactive (05/20/2023)   Exercise Vital Sign    Days of Exercise per Week: 2 days    Minutes of Exercise per Session: 0 min  Stress: Stress Concern Present (05/20/2023)   [employer] of Occupational Health - Occupational Stress Questionnaire    Feeling of Stress : To some extent  Social Connections: Moderately Isolated  (05/20/2023)   Social Connection and Isolation Panel [NHANES]    Frequency of Communication with Friends and Family: Three times a week    Frequency of Social Gatherings with Friends and Family: More than three times a week    Attends Religious Services: Never    Database administrator or Organizations: No    Attends Engineer, structural: Not on file    Marital Status: Married     Family History: The patient's family history includes Alzheimer's disease in her mother and paternal grandmother; Brain cancer in her maternal aunt; Colon polyps in her mother; Dementia in her mother; Diabetes in her father; Esophageal cancer in her maternal grandmother; Heart disease in her father; Kidney cancer in her mother; Lung cancer in her maternal uncle and other family members; Melanoma in an other family member; Migraines in her mother. There is no history of Colon cancer, Rectal cancer, or Stomach cancer.  ROS:   Review of Systems  Constitution: Negative for decreased appetite, fever and weight gain.  HENT: Negative for congestion, ear discharge, hoarse voice and sore throat.   Eyes: Negative for discharge, redness, vision loss in right eye and visual halos.  Cardiovascular: Negative for chest pain, dyspnea on exertion, leg swelling, orthopnea and palpitations.  Respiratory: Negative for cough, hemoptysis, shortness of breath and snoring.   Endocrine: Negative for heat intolerance and polyphagia.  Hematologic/Lymphatic: Negative for bleeding problem. Does not bruise/bleed easily.  Skin: Negative for flushing, nail changes, rash and suspicious lesions.  Musculoskeletal: Negative for arthritis, joint pain, muscle cramps, myalgias, neck pain and stiffness.  Gastrointestinal: Negative for abdominal pain, bowel incontinence, diarrhea and excessive appetite.  Genitourinary: Negative for decreased libido, genital sores and incomplete emptying.  Neurological: Negative for brief paralysis, focal weakness,  headaches and loss of balance.  Psychiatric/Behavioral: Negative for altered mental status, depression and suicidal ideas.  Allergic/Immunologic: Negative for HIV exposure and persistent infections.    EKGs/Labs/Other Studies Reviewed:    The following studies were reviewed today:   EKG:  The ekg ordered today demonstrates   Recent Labs: 02/28/2023: B Natriuretic Peptide  19.3 03/03/2023: Magnesium 1.6 03/14/2023: ALT 14; BUN 11; Creatinine, Ser 1.00; Potassium 3.7; Sodium 140 06/05/2023: Hemoglobin 12.4; Platelets 397.0  Recent Lipid Panel    Component Value Date/Time   CHOL 217 (H) 09/02/2021 1131   TRIG 204.0 (H) 09/02/2021 1131   TRIG 136 11/22/2006 0728   HDL 52.80 09/02/2021 1131   CHOLHDL 4 09/02/2021 1131   VLDL 40.8 (H) 09/02/2021 1131   LDLCALC 85 07/16/2020 1300   LDLDIRECT 120.0 09/02/2021 1131    Physical Exam:    VS:  BP (!) 130/90 (BP Location: Left Arm, Patient Position: Sitting, Cuff Size: Normal)   Pulse 71   Ht 4\' 10"  (1.473 m)   Wt 145 lb 3.2 oz (65.9 kg)   LMP 11/24/2016   SpO2 97%   BMI 30.35 kg/m     Wt Readings from Last 3 Encounters:  09/06/23 145 lb 3.2 oz (65.9 kg)  08/15/23 141 lb (64 kg)  06/22/23 143 lb (64.9 kg)     GEN: Well nourished, well developed in no acute distress HEENT: Normal NECK: No JVD; No carotid bruits LYMPHATICS: No lymphadenopathy CARDIAC: S1S2 noted,RRR, no murmurs, rubs, gallops RESPIRATORY:  Clear to auscultation without rales, wheezing or rhonchi  ABDOMEN: Soft, non-tender, non-distended, +bowel sounds, no guarding. EXTREMITIES: No edema, No cyanosis, no clubbing MUSCULOSKELETAL:  No deformity  SKIN: Warm and dry NEUROLOGIC:  Alert and oriented x 3, non-focal PSYCHIATRIC:  Normal affect, good insight  ASSESSMENT:    1. Primary hypertension   2. Nonspecific abnormal electrocardiogram (ECG) (EKG)   3. Dizziness   4. Abnormal electrocardiogram   5. Pre-procedure lab exam    PLAN:     Preoperative  Cardiac Evaluation Patient with a hiatal hernia and ulcer scheduled for surgery. No history of cardiac disease but has a family history of heart disease. Recent onset of dizziness, possibly related to amlodipine. Abnormal EKG with flipped T waves in the anterior leads suggestive of potential anterior wall ischemia. -Order coronary CT scan to assess for coronary artery disease.  -Place a 7-day heart monitor after the CT scan to evaluate for arrhythmias or other causes of dizziness. -Follow-up in 12 weeks to discuss results and further management.  Hypertension Patient recently switched from Olmesartan to Amlodipine, which may be causing dizziness. -Consider switching back to Olmesartan depending on the results of the heart monitor and coronary CT scan.  Diet and Lifestyle Patient's diet could be improved, especially considering the family history of heart disease. -Encourage healthier eating habits, including reducing sugar intake and increasing consumption of vegetables, lean proteins, and healthy fats.  The patient is in agreement with the above plan. The patient left the office in stable condition.  The patient will follow up in   Medication Adjustments/Labs and Tests Ordered: Current medicines are reviewed at length with the patient today.  Concerns regarding medicines are outlined above.  Orders Placed This Encounter  Procedures   CT CORONARY MORPH W/CTA COR W/SCORE W/CA W/CM &/OR WO/CM   Comprehensive Metabolic Panel (CMET)   Magnesium   LONG TERM MONITOR (3-14 DAYS)   EKG 12-Lead   Meds ordered this encounter  Medications   metoprolol tartrate (LOPRESSOR) 100 MG tablet    Sig: Take 2 hours prior to CT    Dispense:  1 tablet    Refill:  0    Patient Instructions  Medication Instructions:  Your physician recommends that you continue on your current medications as directed. Please refer to the Current Medication list given to  you today.  *If you need a refill on your cardiac  medications before your next appointment, please call your pharmacy*   Lab Work: CMET, Mag If you have labs (blood work) drawn today and your tests are completely normal, you will receive your results only by: MyChart Message (if you have MyChart) OR A paper copy in the mail If you have any lab test that is abnormal or we need to change your treatment, we will call you to review the results.   Testing/Procedures:   Your cardiac CT will be scheduled at one of the below locations:   [facility] [address] [place], [place] [address] [phone]   If scheduled at [facility], please arrive at the [facility] and Children's Entrance (Entrance C2) of [facility] 30 minutes prior to test start time. You can use the FREE valet parking offered at entrance C (encouraged to control the heart rate for the test)  Proceed to the [facility] Radiology Department (first floor) to check-in and test prep.  All radiology patients and guests should use entrance C2 at [facility], accessed from [facility], even though the hospital's physical address listed is [address].    Please follow these instructions carefully (unless otherwise directed):  An IV will be required for this test and Nitroglycerin will be given.  Hold all erectile dysfunction medications at least 3 days (72 hrs) prior to test. (Ie viagra, cialis, sildenafil, tadalafil, etc)   On the Night Before the Test: Be sure to Drink plenty of water. Do not consume any caffeinated/decaffeinated beverages or chocolate 12 hours prior to your test. Do not take any antihistamines 12 hours prior to your test.  On the Day of the Test: Drink plenty of water until 1 hour prior to the test. Do not eat any food 1 hour prior to test. You may take your regular medications prior to the test.  Take metoprolol (Lopressor) two hours prior to test. If you take  Furosemide/Hydrochlorothiazide/Spironolactone, please HOLD on the morning of the test. FEMALES- please wear underwire-free bra if available, avoid dresses & tight clothing      After the Test: Drink plenty of water. After receiving IV contrast, you may experience a mild flushed feeling. This is normal. On occasion, you may experience a mild rash up to 24 hours after the test. This is not dangerous. If this occurs, you can take Benadryl 25 mg and increase your fluid intake. If you experience trouble breathing, this can be serious. If it is severe call 911 IMMEDIATELY. If it is mild, please call our office. If you take any of these medications: Glipizide/Metformin, Avandament, Glucavance, please do not take 48 hours after completing test unless otherwise instructed.  We will call to schedule your test 2-4 weeks out understanding that some insurance companies will need an authorization prior to the service being performed.   For more information and frequently asked questions, please visit our website : [email or web address]  For non-scheduling related questions, please contact the cardiac imaging nurse navigator should you have any questions/concerns: Cardiac Imaging Nurse Navigators Direct Office Dial: [phone]   For scheduling needs, including cancellations and rescheduling, please call [place], [phone].  ZIO XT- Long Term Monitor Instructions  Your physician has requested you wear a ZIO patch monitor for 7 days.  This is a single patch monitor. [employer] supplies one patch monitor per enrollment. Additional stickers are not available. Please do not apply patch if you  will be having a Nuclear Stress Test,  Echocardiogram, Cardiac CT, MRI, or Chest Xray during the period you would be wearing the  monitor. The patch cannot be worn during these tests. You cannot remove and re-apply the  ZIO XT patch monitor.  Your ZIO patch monitor will be mailed 3 day USPS to your  address on file. It may take 3-5 days  to receive your monitor after you have been enrolled.  Once you have received your monitor, please review the enclosed instructions. Your monitor  has already been registered assigning a specific monitor serial # to you.  Billing and Patient Assistance Program Information  We have supplied [employer] with any of your insurance information on file for billing purposes. [employer] offers a sliding scale Patient Assistance Program for patients that do not have  insurance, or whose insurance does not completely cover the cost of the ZIO monitor.  You must apply for the Patient Assistance Program to qualify for this discounted rate.  To apply, please call [employer] at [phone], select option 4, select option 2, ask to apply for  Patient Assistance Program. Meredeth Ide will ask your household income, and how many people  are in your household. They will quote your out-of-pocket cost based on that information.  [employer] will also be able to set up a 32-month, interest-free payment plan if needed.  Applying the monitor   Shave hair from upper left chest.  Hold abrader disc by orange tab. Rub abrader in 40 strokes over the upper left chest as  indicated in your monitor instructions.  Clean area with 4 enclosed alcohol pads. Let dry.  Apply patch as indicated in monitor instructions. Patch will be placed under collarbone on left  side of chest with arrow pointing upward.  Rub patch adhesive wings for 2 minutes. Remove white label marked "1". Remove the white  label marked "2". Rub patch adhesive wings for 2 additional minutes.  While looking in a mirror, press and release button in center of patch. A small green light will  flash 3-4 times. This will be your only indicator that the monitor has been turned on.  Do not shower for the first 24 hours. You may shower after the first 24 hours.  Press the button if you feel a symptom. You will hear a small click. Record  Date, Time and  Symptom in the Patient Logbook.  When you are ready to remove the patch, follow instructions on the last 2 pages of Patient  Logbook. Stick patch monitor onto the last page of Patient Logbook.  Place Patient Logbook in the blue and white box. Use locking tab on box and tape box closed  securely. The blue and white box has prepaid postage on it. Please place it in the mailbox as  soon as possible. Your physician should have your test results approximately 7 days after the  monitor has been mailed back to [facility].  Call [facility] Customer Care at [phone] if you have questions regarding  your ZIO XT patch monitor. Call them immediately if you see an orange light blinking on your  monitor.  If your monitor falls off in less than 4 days, contact our Monitor department at [phone].  If your monitor becomes loose or falls off after 4 days call [employer] at [phone] for  suggestions on securing your monitor   Follow-Up: At [facility], you and your health needs are our priority.  As part of our continuing mission to provide you  with exceptional heart care, we have created designated Provider Care Teams.  These Care Teams include your primary Cardiologist (physician) and Advanced Practice Providers (APPs -  Physician Assistants and Nurse Practitioners) who all work together to provide you with the care you need, when you need it.  Your next appointment:   12 week(s)  Provider:   Thomasene Ripple, DO      Adopting a Healthy Lifestyle.  Know what a healthy weight is for you (roughly BMI <25) and aim to maintain this   Aim for 7+ servings of fruits and vegetables daily   65-80+ fluid ounces of water or unsweet tea for healthy kidneys   Limit to max 1 drink of alcohol per day; avoid smoking/tobacco   Limit animal fats in diet for cholesterol and heart health - choose grass fed whenever available   Avoid highly processed foods, and foods high in  saturated/trans fats   Aim for low stress - take time to unwind and care for your mental health   Aim for 150 min of moderate intensity exercise weekly for heart health, and weights twice weekly for bone health   Aim for 7-9 hours of sleep daily   When it comes to diets, agreement about the perfect plan isnt easy to find, even among the experts. Experts at the [facility] [employer] developed an idea known as the Healthy Eating Plate. Just imagine a plate divided into logical, healthy portions.   The emphasis is on diet quality:   Load up on vegetables and fruits - one-half of your plate: Aim for color and variety, and remember that potatoes dont count.   Go for whole grains - one-quarter of your plate: Whole wheat, barley, wheat berries, quinoa, oats, brown rice, and foods made with them. If you want pasta, go with whole wheat pasta.   Protein power - one-quarter of your plate: Fish, chicken, beans, and nuts are all healthy, versatile protein sources. Limit red meat.   The diet, however, does go beyond the plate, offering a few other suggestions.   Use healthy plant oils, such as olive, canola, soy, corn, sunflower and peanut. Check the labels, and avoid partially hydrogenated oil, which have unhealthy trans fats.   If youre thirsty, drink water. Coffee and tea are good in moderation, but skip sugary drinks and limit milk and dairy products to one or two daily servings.   The type of carbohydrate in the diet is more important than the amount. Some sources of carbohydrates, such as vegetables, fruits, whole grains, and beans-are healthier than others.   Finally, stay active  Signed, Samantha Ripple, DO  09/06/2023 5:05 PM     [facility]

## 2023-09-06 NOTE — Progress Notes (Unsigned)
Enrolled for [employer] to mail a ZIO XT long term holter monitor to the patients address on file.  

## 2023-09-06 NOTE — Patient Instructions (Addendum)
Medication Instructions:  Your physician recommends that you continue on your current medications as directed. Please refer to the Current Medication list given to you today.  *If you need a refill on your cardiac medications before your next appointment, please call your pharmacy*   Lab Work: CMET, Mag If you have labs (blood work) drawn today and your tests are completely normal, you will receive your results only by: MyChart Message (if you have MyChart) OR A paper copy in the mail If you have any lab test that is abnormal or we need to change your treatment, we will call you to review the results.   Testing/Procedures:   Your cardiac CT will be scheduled at one of the below locations:   [facility] [address] [place], [place] [address] [phone]   If scheduled at [facility], please arrive at the [facility] and Children's Entrance (Entrance C2) of [facility] 30 minutes prior to test start time. You can use the FREE valet parking offered at entrance C (encouraged to control the heart rate for the test)  Proceed to the [facility] Radiology Department (first floor) to check-in and test prep.  All radiology patients and guests should use entrance C2 at [facility], accessed from [facility], even though the hospital's physical address listed is [address].    Please follow these instructions carefully (unless otherwise directed):  An IV will be required for this test and Nitroglycerin will be given.  Hold all erectile dysfunction medications at least 3 days (72 hrs) prior to test. (Ie viagra, cialis, sildenafil, tadalafil, etc)   On the Night Before the Test: Be sure to Drink plenty of water. Do not consume any caffeinated/decaffeinated beverages or chocolate 12 hours prior to your test. Do not take any antihistamines 12 hours prior to your test.  On the Day of the Test: Drink plenty of water until 1 hour  prior to the test. Do not eat any food 1 hour prior to test. You may take your regular medications prior to the test.  Take metoprolol (Lopressor) two hours prior to test. If you take Furosemide/Hydrochlorothiazide/Spironolactone, please HOLD on the morning of the test. FEMALES- please wear underwire-free bra if available, avoid dresses & tight clothing      After the Test: Drink plenty of water. After receiving IV contrast, you may experience a mild flushed feeling. This is normal. On occasion, you may experience a mild rash up to 24 hours after the test. This is not dangerous. If this occurs, you can take Benadryl 25 mg and increase your fluid intake. If you experience trouble breathing, this can be serious. If it is severe call 911 IMMEDIATELY. If it is mild, please call our office. If you take any of these medications: Glipizide/Metformin, Avandament, Glucavance, please do not take 48 hours after completing test unless otherwise instructed.  We will call to schedule your test 2-4 weeks out understanding that some insurance companies will need an authorization prior to the service being performed.   For more information and frequently asked questions, please visit our website : [email or web address]  For non-scheduling related questions, please contact the cardiac imaging nurse navigator should you have any questions/concerns: Cardiac Imaging Nurse Navigators Direct Office Dial: [phone]   For scheduling needs, including cancellations and rescheduling, please call [place], [phone].  ZIO XT- Long Term Monitor Instructions  Your physician has requested you wear a ZIO patch monitor for 7 days.  This is  a single patch monitor. [employer] supplies one patch monitor per enrollment. Additional stickers are not available. Please do not apply patch if you will be having a Nuclear Stress Test,  Echocardiogram, Cardiac CT, MRI, or Chest Xray during the period you would be  wearing the  monitor. The patch cannot be worn during these tests. You cannot remove and re-apply the  ZIO XT patch monitor.  Your ZIO patch monitor will be mailed 3 day USPS to your address on file. It may take 3-5 days  to receive your monitor after you have been enrolled.  Once you have received your monitor, please review the enclosed instructions. Your monitor  has already been registered assigning a specific monitor serial # to you.  Billing and Patient Assistance Program Information  We have supplied [employer] with any of your insurance information on file for billing purposes. [employer] offers a sliding scale Patient Assistance Program for patients that do not have  insurance, or whose insurance does not completely cover the cost of the ZIO monitor.  You must apply for the Patient Assistance Program to qualify for this discounted rate.  To apply, please call [employer] at [phone], select option 4, select option 2, ask to apply for  Patient Assistance Program. Meredeth Ide will ask your household income, and how many people  are in your household. They will quote your out-of-pocket cost based on that information.  [employer] will also be able to set up a 62-month, interest-free payment plan if needed.  Applying the monitor   Shave hair from upper left chest.  Hold abrader disc by orange tab. Rub abrader in 40 strokes over the upper left chest as  indicated in your monitor instructions.  Clean area with 4 enclosed alcohol pads. Let dry.  Apply patch as indicated in monitor instructions. Patch will be placed under collarbone on left  side of chest with arrow pointing upward.  Rub patch adhesive wings for 2 minutes. Remove white label marked "1". Remove the white  label marked "2". Rub patch adhesive wings for 2 additional minutes.  While looking in a mirror, press and release button in center of patch. A small green light will  flash 3-4 times. This will be your only indicator that the  monitor has been turned on.  Do not shower for the first 24 hours. You may shower after the first 24 hours.  Press the button if you feel a symptom. You will hear a small click. Record Date, Time and  Symptom in the Patient Logbook.  When you are ready to remove the patch, follow instructions on the last 2 pages of Patient  Logbook. Stick patch monitor onto the last page of Patient Logbook.  Place Patient Logbook in the blue and white box. Use locking tab on box and tape box closed  securely. The blue and white box has prepaid postage on it. Please place it in the mailbox as  soon as possible. Your physician should have your test results approximately 7 days after the  monitor has been mailed back to [facility].  Call [facility] Customer Care at [phone] if you have questions regarding  your ZIO XT patch monitor. Call them immediately if you see an orange light blinking on your  monitor.  If your monitor falls off in less than 4 days, contact our Monitor department at [phone].  If your monitor becomes loose or falls off after 4 days call [employer] at [phone] for  suggestions on securing your monitor  Follow-Up: At [facility], you and your health needs are our priority.  As part of our continuing mission to provide you with exceptional heart care, we have created designated Provider Care Teams.  These Care Teams include your primary Cardiologist (physician) and Advanced Practice Providers (APPs -  Physician Assistants and Nurse Practitioners) who all work together to provide you with the care you need, when you need it.  Your next appointment:   12 week(s)  Provider:   Thomasene Ripple, DO

## 2023-09-07 ENCOUNTER — Telehealth: Payer: Self-pay

## 2023-09-07 ENCOUNTER — Ambulatory Visit: Payer: Medicare HMO

## 2023-09-07 VITALS — Ht <= 58 in | Wt 145.0 lb

## 2023-09-07 DIAGNOSIS — Z0000 Encounter for general adult medical examination without abnormal findings: Secondary | ICD-10-CM | POA: Diagnosis not present

## 2023-09-07 LAB — COMPREHENSIVE METABOLIC PANEL
ALT: 27 [IU]/L (ref 0–32)
AST: 32 [IU]/L (ref 0–40)
Albumin: 4.3 g/dL (ref 3.8–4.9)
Alkaline Phosphatase: 108 [IU]/L (ref 44–121)
BUN/Creatinine Ratio: 16 (ref 9–23)
BUN: 19 mg/dL (ref 6–24)
Bilirubin Total: 0.3 mg/dL (ref 0.0–1.2)
CO2: 27 mmol/L (ref 20–29)
Calcium: 9.5 mg/dL (ref 8.7–10.2)
Chloride: 104 mmol/L (ref 96–106)
Creatinine, Ser: 1.22 mg/dL — ABNORMAL HIGH (ref 0.57–1.00)
Globulin, Total: 2.4 g/dL (ref 1.5–4.5)
Glucose: 88 mg/dL (ref 70–99)
Potassium: 4.4 mmol/L (ref 3.5–5.2)
Sodium: 142 mmol/L (ref 134–144)
Total Protein: 6.7 g/dL (ref 6.0–8.5)
eGFR: 51 mL/min/{1.73_m2} — ABNORMAL LOW (ref >59)

## 2023-09-07 LAB — MAGNESIUM: Magnesium: 2.3 mg/dL (ref 1.6–2.3)

## 2023-09-07 NOTE — Patient Instructions (Addendum)
Samantha [name] , Thank you for taking time to come for your Medicare Wellness Visit. I appreciate your ongoing commitment to your health goals. Please review the following plan we discussed and let me know if I can assist you in the future.   Referrals/Orders/Follow-Ups/Clinician Recommendations: You are due for a Flu and Covid vaccines.  Remember that you have an appointment next week with Dr. Posey Rea.  It was nice talking with you today.  Aim for 30 minutes of exercise or brisk walking, 6-8 glasses of water, and 5 servings of fruits and vegetables each day.   This is a list of the screening recommended for you and due dates:  Health Maintenance  Topic Date Due   Hepatitis C Screening  Never done   Pap with HPV screening  09/06/2019   COVID-19 Vaccine (3 - Pfizer risk series) 03/30/2020   Flu Shot  02/12/2024*   Medicare Annual Wellness Visit  09/06/2024   DTaP/Tdap/Td vaccine (2 - Td or Tdap) 01/22/2025   Mammogram  07/26/2025   Colon Cancer Screening  01/02/2033   HIV Screening  Completed   HPV Vaccine  Aged Out   Zoster (Shingles) Vaccine  Discontinued  *Topic was postponed. The date shown is not the original due date.    Advanced directives: (Copy Requested) Please bring a copy of your health care power of attorney and living will to the office to be added to your chart at your convenience.  Next Medicare Annual Wellness Visit scheduled for next year: No

## 2023-09-07 NOTE — Telephone Encounter (Signed)
Patient stated that she has been dizzy x 2 weeks, at night and when standing up.  She saw cardiology yesterday and spoke about the dizziness, however she would like to get Dr. Loren Racer opinion about what he thinks she should do.

## 2023-09-07 NOTE — Progress Notes (Addendum)
Subjective:   Samantha [name] is a 57 y.o. female who presents for Medicare Annual (Subsequent) preventive examination.  Visit Complete: Virtual I connected with  [name] on 09/07/23 by a audio enabled telemedicine application and verified that I am speaking with the correct person using two identifiers.  Patient Location: Home  Provider Location: Home Office  I discussed the limitations of evaluation and management by telemedicine. The patient expressed understanding and agreed to proceed.  Vital Signs: Because this visit was a virtual/telehealth visit, some criteria may be missing or patient reported. Any vitals not documented were not able to be obtained and vitals that have been documented are patient reported.   Cardiac Risk Factors include: advanced age (>56men, >8 women);hypertension;Other (see comment), Risk factor comments: OSA, CKD, CVA     Objective:    Today's Vitals   09/07/23 1507 09/07/23 1518  Weight: 145 lb (65.8 kg)   Height: 4\' 10"  (1.473 m)   PainSc:  5    Body mass index is 30.31 kg/m.     09/07/2023    3:37 PM 03/01/2023    1:13 PM 02/28/2023    4:50 PM 09/05/2022    1:12 PM 05/02/2022    5:06 PM 05/01/2022    8:55 PM 09/02/2021   11:35 AM  Advanced Directives  Does Patient Have a Medical Advance Directive? Yes No No Yes No;Yes No No  Type of Estate agent of [place];Living will   Healthcare Power of [place];Living will     Does patient want to make changes to medical advance directive?    No - Patient declined     Copy of Healthcare Power of Attorney in Chart? No - copy requested   No - copy requested     Would patient like information on creating a medical advance directive?  No - Patient declined No - Patient declined  No - Patient declined  No - Patient declined    Current Medications (verified) Outpatient Encounter Medications as of 09/07/2023  Medication Sig   amLODipine (NORVASC) 2.5 MG tablet Take 1 tablet  (2.5 mg total) by mouth daily.   atorvastatin (LIPITOR) 20 MG tablet TAKE 1 TABLET EVERY DAY AT 6 PM   busPIRone (BUSPAR) 10 MG tablet TAKE 1 TABLET THREE TIMES DAILY (Patient taking differently: Take 10 mg by mouth 3 (three) times daily.)   Calcium Carb-Cholecalciferol (CALCIUM+D3 PO) Take 1 tablet by mouth 2 (two) times daily with a meal.   Docusate Calcium (STOOL SOFTENER PO) Take 1 capsule by mouth 2 (two) times daily as needed (for constipation).   donepezil (ARICEPT) 10 MG tablet TAKE 1 TABLET AT BEDTIME   DULoxetine (CYMBALTA) 60 MG capsule TAKE 1 CAPSULE EVERY DAY (Patient taking differently: Take 60 mg by mouth at bedtime.)   famotidine (PEPCID) 40 MG tablet TAKE 1 TABLET EVERY DAY (Patient taking differently: Take 40 mg by mouth daily as needed for heartburn or indigestion (if Protonix is ineffective).)   Ferric Maltol (ACCRUFER) 30 MG CAPS Take 1 capsule (30 mg total) by mouth 2 (two) times daily.   gabapentin (NEURONTIN) 600 MG tablet Take 1 tablet 3 times a day   lactulose (CHRONULAC) 10 GM/15ML solution Take 30-45 mLs (20-30 g total) by mouth 2 (two) times daily as needed for moderate constipation or severe constipation.   lamoTRIgine (LAMICTAL) 100 MG tablet TAKE 1 TABLET EVERY MORNING, AND 1 AND 1/2 TABLETS AT NIGHT. (Patient taking differently: Take 100-150 mg by mouth See admin  instructions. Take 100 mg by mouth in the morning and 150 mg at bedtime)   lidocaine (LIDODERM) 5 % Place 1-2 patches onto the skin daily. Remove & Discard patch within 12 hours or as directed by MD   Lidocaine HCl (ASPERCREME LIDOCAINE ESSENTIAL) 4 % LIQD Apply 1 application  topically 2 (two) times daily as needed (to painful sites).   LINZESS 290 MCG CAPS capsule TAKE 1 CAPSULE DAILY BEFORE BREAKFAST   memantine (NAMENDA) 5 MG tablet TAKE 1 TABLET TWICE DAILY   metoprolol tartrate (LOPRESSOR) 100 MG tablet Take 2 hours prior to CT   morphine (MS CONTIN) 30 MG 12 hr tablet Take 1 tablet (30 mg total) by  mouth every 12 (twelve) hours.   ondansetron (ZOFRAN) 4 MG tablet TAKE 1 TABLET EVERY 8 HOURS AS NEEDED FOR NAUSEA OR VOMITING.   Oxycodone HCl 10 MG TABS Take 1 tablet (10 mg total) by mouth 3 (three) times daily as needed.   Probiotic Product (ALIGN) 4 MG CAPS Take 1 capsule (4 mg total) by mouth daily. (Patient taking differently: Take 4 mg by mouth daily as needed (for stomach or constipation issues).)   promethazine (PHENERGAN) 12.5 MG tablet Take 1 tablet (12.5 mg total) by mouth every 6 (six) hours as needed for nausea or vomiting.   olmesartan (BENICAR) 20 MG tablet Take 20 mg by mouth daily. (Patient not taking: Reported on 09/07/2023)   pantoprazole (PROTONIX) 40 MG tablet Take 1 tablet (40 mg total) by mouth 2 (two) times daily before a meal.   No facility-administered encounter medications on file as of 09/07/2023.    Allergies (verified) Alprazolam, Citalopram hydrobromide, and Olmesartan   History: Past Medical History:  Diagnosis Date   Anxiety    Sheria Lang lesion, acute    Cerebral ventricular shunt fitting or adjustment    CKD (chronic kidney disease), stage III (HCC)    COMMON MIGRAINE 10/01/2007   Chronic     Depression    Esophageal ulceration    GERD (gastroesophageal reflux disease)    GI bleed    GLUCOSE INTOLERANCE 10/01/2007   Qualifier: Diagnosis of  By: Jonny Ruiz MD, Len Blalock    Hiatal hernia    Hydrocephalus (HCC)    Hyperlipidemia    HYPERLIPIDEMIA 10/01/2007   Chronic. Lovaza is too $$$ Declined statins due to worries    IDA (iron deficiency anemia)    Memory loss    Migraine    OBSTRUCTIVE SLEEP APNEA 10/01/2007   NPSG 2006:  AHI 9/hr Started cpap 2009 successfully.      Paresthesia    Prolonged QT interval    Schatzki's ring    Sleep apnea    Stroke (HCC) 03/14/2013   Past Surgical History:  Procedure Laterality Date   Ames Shunt  1976,   cerebral vascular shunt/ with a revision 1981   BIOPSY  05/02/2022   Procedure: BIOPSY;  Surgeon: Imogene Burn, MD;  Location: [facility] ENDOSCOPY;  Service: Gastroenterology;;   CHOLECYSTECTOMY  1981   CYST REMOVAL NECK     ESOPHAGEAL MANOMETRY N/A 05/03/2023   Procedure: ESOPHAGEAL MANOMETRY (EM);  Surgeon: Beverley Fiedler, MD;  Location: [facility] ENDOSCOPY;  Service: Gastroenterology;  Laterality: N/A;   ESOPHAGOGASTRODUODENOSCOPY (EGD) WITH PROPOFOL N/A 05/02/2022   Procedure: ESOPHAGOGASTRODUODENOSCOPY (EGD) WITH PROPOFOL;  Surgeon: Imogene Burn, MD;  Location: [facility] ENDOSCOPY;  Service: Gastroenterology;  Laterality: N/A;   ESOPHAGOGASTRODUODENOSCOPY (EGD) WITH PROPOFOL N/A 03/01/2023   Procedure: ESOPHAGOGASTRODUODENOSCOPY (EGD) WITH PROPOFOL;  Surgeon: Erick Blinks  M, MD;  Location: [facility] ENDOSCOPY;  Service: Gastroenterology;  Laterality: N/A;   HEMOSTASIS CLIP PLACEMENT  03/01/2023   Procedure: HEMOSTASIS CLIP PLACEMENT;  Surgeon: Beverley Fiedler, MD;  Location: [facility] ENDOSCOPY;  Service: Gastroenterology;;   HEMOSTASIS CONTROL  03/01/2023   Procedure: HEMOSTASIS CONTROL;  Surgeon: Beverley Fiedler, MD;  Location: [facility] ENDOSCOPY;  Service: Gastroenterology;;   Pituitary cyst w/subsequent shunt     Family History  Problem Relation Age of Onset   Dementia Mother    Kidney cancer Mother    Alzheimer's disease Mother    Migraines Mother    Colon polyps Mother    Heart disease Father    Diabetes Father    Brain cancer Maternal Aunt    Lung cancer Maternal Uncle    Esophageal cancer Maternal Grandmother    Alzheimer's disease Paternal Grandmother    Melanoma Other    Lung cancer Other    Lung cancer Other    Colon cancer Neg Hx    Rectal cancer Neg Hx    Stomach cancer Neg Hx    Social History   Socioeconomic History   Marital status: Married    Spouse name: [name]   Number of children: 0   Years of education: 12   Highest education level: GED or equivalent  Occupational History   Occupation: disabled  Tobacco Use   Smoking status: Never   Smokeless tobacco: Never  Vaping Use   Vaping status: Never  Used  Substance and Sexual Activity   Alcohol use: Yes    Alcohol/week: 1.0 standard drink of alcohol    Types: 1 Glasses of wine per week    Comment: Social   Drug use: No   Sexual activity: Yes  Other Topics Concern   Not on file  Social History Narrative   Mother is in a NH.    Patient lives at home with her husband [name]). 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    High school education.   Right handed.   Caffeine 1 cup daily.   She has no children   Social Determinants of Health   Financial Resource Strain: Low Risk  (09/07/2023)   Overall Financial Resource Strain (CARDIA)    Difficulty of Paying Living Expenses: Not hard at all  Food Insecurity: No Food Insecurity (09/07/2023)   Hunger Vital Sign    Worried About Running Out of Food in the Last Year: Never true    Ran Out of Food in the Last Year: Never true  Transportation Needs: No Transportation Needs (09/07/2023)   PRAPARE - Administrator, Civil Service (Medical): No    Lack of Transportation (Non-Medical): No  Physical Activity: Inactive (09/07/2023)   Exercise Vital Sign    Days of Exercise per Week: 0 days    Minutes of Exercise per Session: 0 min  Stress: No Stress Concern Present (09/07/2023)   [employer] of Occupational Health - Occupational Stress Questionnaire    Feeling of Stress : Only a little  Social Connections: Moderately Isolated (09/07/2023)   Social Connection and Isolation Panel [NHANES]    Frequency of Communication with Friends and Family: More than three times a week    Frequency of Social Gatherings with Friends and Family: More than three times a week    Attends Religious Services: Never    Database administrator or Organizations: No    Attends Banker Meetings: Never    Marital Status: Married  Tobacco Counseling Counseling given: Not Answered   Clinical Intake:  Pre-visit preparation completed: Yes  Pain : 0-10 Pain Score: 5  Pain  Type: Acute pain Pain Location: Back (both knees) Pain Orientation: Mid Pain Descriptors / Indicators: Constant, Discomfort Pain Onset: More than a month ago Pain Frequency: Constant Pain Relieving Factors: Gabapentin, Morphine, Oxycodone  Pain Relieving Factors: Gabapentin, Morphine, Oxycodone  BMI - recorded: 30.31 Nutritional Status: BMI > 30  Obese Nutritional Risks: None Diabetes: No  How often do you need to have someone help you when you read instructions, pamphlets, or other written materials from your doctor or pharmacy?: 1 - Never  Interpreter Needed?: No  Information entered by :: Rian Busche, RMA   Activities of Daily Living    09/07/2023    3:10 PM 02/28/2023    8:10 PM  In your present state of health, do you have any difficulty performing the following activities:  Hearing? 0   Vision? 0   Difficulty concentrating or making decisions? 0   Walking or climbing stairs? 0   Dressing or bathing? 0   Doing errands, shopping? 0 1  Comment Husband drives or best friend   Quarry manager and eating ? N   Using the Toilet? N   In the past six months, have you accidently leaked urine? N   Do you have problems with loss of bowel control? N   Managing your Medications? N   Managing your Finances? N   Housekeeping or managing your Housekeeping? N     Patient Care Team: Plotnikov, Georgina Quint, MD as PCP - General (Internal Medicine) Thomasene Ripple, DO as PCP - Cardiology (Cardiology) Plotnikov, Georgina Quint, MD as Referring Physician (Internal Medicine) Teodora Medici, MD (Obstetrics and Gynecology) Levert Feinstein, MD as Consulting Physician (Neurology) Coralyn Helling, MD (Inactive) as Consulting Physician (Pulmonary Disease) Shellia Carwin., MD as Referring Physician (Neurosurgery) Ellwood Sayers, MD as Referring Physician (Physical Medicine and Rehabilitation) Blima Ledger, OD as Consulting Physician (Optometry) Gaynelle Adu, MD as Consulting Physician (General  Surgery) Pyrtle, Carie Caddy, MD as Consulting Physician (Gastroenterology)  Indicate any recent Medical Services you may have received from other than [facility] providers in the past year (date may be approximate).     Assessment:   This is a routine wellness examination for [place].  Hearing/Vision screen Hearing Screening - Comments:: Denies hearing difficulties   Vision Screening - Comments:: Wears eyeglasses   Goals Addressed             This Visit's Progress    Patient Stated   On track    I would like to work on losing weight.      Depression Screen    09/07/2023    3:43 PM 08/15/2023    1:41 PM 07/25/2023    1:42 PM 04/26/2023    9:50 AM 03/27/2023   11:32 AM 02/08/2023    3:47 PM 01/25/2023    1:46 PM  PHQ 2/9 Scores  PHQ - 2 Score 2 0 0 0 0 0 0  PHQ- 9 Score 3   0 0 0 0    Fall Risk    09/07/2023    3:37 PM 08/15/2023    1:41 PM 07/25/2023    1:41 PM 04/26/2023    9:50 AM 03/27/2023   11:32 AM  Fall Risk   Falls in the past year? 0 1 1 1 1   Number falls in past yr: 0 1 0 0 0  Injury with Fall? 0  1 1 0 0  Risk for fall due to : No Fall Risks History of fall(s) History of fall(s);No Fall Risks History of fall(s) Impaired balance/gait  Follow up Falls prevention discussed;Falls evaluation completed Falls evaluation completed Falls evaluation completed Falls evaluation completed Falls evaluation completed    MEDICARE RISK AT HOME: Medicare Risk at Home Any stairs in or around the home?: Yes (3 steps off deck) If so, are there any without handrails?: Yes Home free of loose throw rugs in walkways, pet beds, electrical cords, etc?: Yes Adequate lighting in your home to reduce risk of falls?: Yes Life alert?: No Use of a cane, walker or w/c?: Yes Grab bars in the bathroom?: Yes Shower chair or bench in shower?: Yes Elevated toilet seat or a handicapped toilet?: Yes  TIMED UP AND GO:  Was the test performed?  No    Cognitive Function:    03/18/2015   11:35 AM  12/18/2014   11:25 AM  MMSE - Mini Mental State Exam  Orientation to time 5 5  Orientation to Place 5 5  Registration 3 3  Attention/ Calculation 5 5  Recall 2 2  Language- name 2 objects 2 2  Language- repeat 1 1  Language- follow 3 step command 3 3  Language- read & follow direction 1 1  Write a sentence 1 1  Copy design 1 1  Total score 29 29        09/07/2023    3:38 PM 09/05/2022    1:18 PM  6CIT Screen  What Year? 0 points 0 points  What month? 0 points 0 points  What time? 0 points 0 points  Count back from 20 0 points 0 points  Months in reverse 0 points 0 points  Repeat phrase 2 points 0 points  Total Score 2 points 0 points    Immunizations Immunization History  Administered Date(s) Administered   Influenza Whole 09/06/2012   Influenza,inj,Quad PF,6+ Mos 08/08/2013, 07/07/2014, 08/27/2015, 08/22/2016, 09/02/2021, 07/28/2022   PFIZER(Purple Top)SARS-COV-2 Vaccination 02/07/2020, 03/02/2020   Pneumococcal Conjugate-13 12/26/2016   Tdap 01/23/2015    TDAP status: Up to date  Flu Vaccine status: Due, Education has been provided regarding the importance of this vaccine. Advised may receive this vaccine at local pharmacy or Health Dept. Aware to provide a copy of the vaccination record if obtained from local pharmacy or Health Dept. Verbalized acceptance and understanding.   Covid-19 vaccine status: Information provided on how to obtain vaccines.   Qualifies for Shingles Vaccine? Yes   Zostavax completed Yes   Shingrix Completed?: No.    Education has been provided regarding the importance of this vaccine. Patient has been advised to call insurance company to determine out of pocket expense if they have not yet received this vaccine. Advised may also receive vaccine at local pharmacy or Health Dept. Verbalized acceptance and understanding.  Screening Tests Health Maintenance  Topic Date Due   Hepatitis C Screening  Never done   Cervical Cancer Screening  (HPV/Pap Cotest)  09/06/2019   COVID-19 Vaccine (3 - Pfizer risk series) 03/30/2020   INFLUENZA VACCINE  02/12/2024 (Originally 06/15/2023)   Medicare Annual Wellness (AWV)  09/06/2024   DTaP/Tdap/Td (2 - Td or Tdap) 01/22/2025   MAMMOGRAM  07/26/2025   Colonoscopy  01/02/2033   HIV Screening  Completed   HPV VACCINES  Aged Out   Zoster Vaccines- Shingrix  Discontinued    Health Maintenance  Health Maintenance Due  Topic Date Due   Hepatitis  C Screening  Never done   Cervical Cancer Screening (HPV/Pap Cotest)  09/06/2019   COVID-19 Vaccine (3 - Pfizer risk series) 03/30/2020    Colorectal cancer screening: Type of screening: Colonoscopy. Completed 01/02/2023. Repeat every 10 years  Mammogram status: Completed 07/27/2023. Repeat every year  Lung Cancer Screening: (Low Dose CT Chest recommended if Age 45-80 years, 20 pack-year currently smoking OR have quit w/in 15years.) does not qualify.   Lung Cancer Screening Referral: N/A  Additional Screening:  Hepatitis C Screening: does qualify;   Vision Screening: Recommended annual ophthalmology exams for early detection of glaucoma and other disorders of the eye. Is the patient up to date with their annual eye exam?  Yes  Who is the provider or what is the name of the office in which the patient attends annual eye exams? N/A If pt is not established with a provider, would they like to be referred to a provider to establish care? No .   Dental Screening: Recommended annual dental exams for proper oral hygiene   Community Resource Referral / Chronic Care Management: CRR required this visit?  No   CCM required this visit?  No     Plan:     I have personally reviewed and noted the following in the patient's chart:   Medical and social history Use of alcohol, tobacco or illicit drugs  Current medications and supplements including opioid prescriptions. Patient is currently taking opioid prescriptions. Information provided to  patient regarding non-opioid alternatives. Patient advised to discuss non-opioid treatment plan with their provider. Functional ability and status Nutritional status Physical activity Advanced directives List of other physicians Hospitalizations, surgeries, and ER visits in previous 12 months Vitals Screenings to include cognitive, depression, and falls Referrals and appointments  In addition, I have reviewed and discussed with patient certain preventive protocols, quality metrics, and best practice recommendations. A written personalized care plan for preventive services as well as general preventive health recommendations were provided to patient.     Laguana Desautel L Mick Tanguma, CMA   09/07/2023   After Visit Summary: (MyChart) Due to this being a telephonic visit, the after visit summary with patients personalized plan was offered to patient via MyChart   Nurse Notes: Patient is due for Flu, and Covid vaccines.  Patient stated that she has been dizzy x 2 weeks, at night and when standing up.  She saw cardiology yesterday and spoke about the dizziness, however she would like to get Dr. Loren Racer opinion about what he thinks she should do.  Patient does have an appointment coming up next week with Dr. Posey Rea.  Patient had no other concerns to address today.    Medical screening examination/treatment/procedure(s) were performed by non-physician practitioner and as supervising physician I was immediately available for consultation/collaboration.  I agree with above. Jacinta Shoe, MD

## 2023-09-12 ENCOUNTER — Ambulatory Visit: Payer: Medicare HMO | Admitting: Internal Medicine

## 2023-09-13 ENCOUNTER — Telehealth: Payer: Self-pay | Admitting: Internal Medicine

## 2023-09-13 NOTE — Telephone Encounter (Signed)
Prescription Request  09/13/2023  LOV: 08/15/2023 Pt is inquiring about getting a temporary refill until her appt Please advise.  What is the name of the medication or equipment? morphine (MS CONTIN) 30 MG 12 hr tablet   Oxycodone HCl 10 MG TABS  Have you contacted your pharmacy to request a refill? No   Which pharmacy would you like this sent to?    [id number] [place], [place] - [address] [place] [address] Phone: [phone] Fax: [phone]  Patient notified that their request is being sent to the clinical staff for review and that they should receive a response within 2 business days.   Please advise at Mobile [phone] (mobile)

## 2023-09-13 NOTE — Telephone Encounter (Signed)
Pt would like to be contacted with a response.

## 2023-09-18 MED ORDER — OXYCODONE HCL 10 MG PO TABS: 10.0000 mg | ORAL_TABLET | Freq: Three times a day (TID) | ORAL | 0 refills | Status: DC | PRN

## 2023-09-18 MED ORDER — MORPHINE SULFATE ER 30 MG PO TBCR: 30.0000 mg | EXTENDED_RELEASE_TABLET | Freq: Two times a day (BID) | ORAL | 0 refills | Status: DC

## 2023-09-18 NOTE — Telephone Encounter (Signed)
I renewed her November prescriptions.  October prescriptions were the pharmacy.  Thanks

## 2023-09-18 NOTE — Telephone Encounter (Signed)
Drink more water. Please see me in a follow-up.  Thank you

## 2023-09-18 NOTE — Addendum Note (Signed)
Addended by: Tresa Garter on: 09/18/2023 07:30 AM   Modules accepted: Orders

## 2023-09-19 ENCOUNTER — Telehealth: Payer: Self-pay | Admitting: Internal Medicine

## 2023-09-19 NOTE — Telephone Encounter (Signed)
Patient would like to know if she still needs to keep her appointment on 09/20/2023 since Dr. Posey Rea filled her medications. She would like a call back from the nurse at [phone].

## 2023-09-20 ENCOUNTER — Encounter: Payer: Self-pay | Admitting: Internal Medicine

## 2023-09-20 ENCOUNTER — Ambulatory Visit: Payer: Medicare HMO | Admitting: Internal Medicine

## 2023-09-20 VITALS — BP 122/70 | HR 72 | Temp 98.6°F | Ht <= 58 in | Wt 141.0 lb

## 2023-09-20 DIAGNOSIS — G8929 Other chronic pain: Secondary | ICD-10-CM

## 2023-09-20 DIAGNOSIS — N1831 Chronic kidney disease, stage 3a: Secondary | ICD-10-CM

## 2023-09-20 DIAGNOSIS — R413 Other amnesia: Secondary | ICD-10-CM

## 2023-09-20 DIAGNOSIS — Z23 Encounter for immunization: Secondary | ICD-10-CM | POA: Diagnosis not present
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
## 2023-09-20 NOTE — Assessment & Plan Note (Signed)
Walk more [place] for insomnia Mark or [name] are giving [name] her pain meds; using a safe

## 2023-09-20 NOTE — Assessment & Plan Note (Signed)
Hydrate well 

## 2023-09-21 ENCOUNTER — Ambulatory Visit (HOSPITAL_COMMUNITY): Payer: Medicare HMO

## 2023-09-21 ENCOUNTER — Ambulatory Visit: Payer: Medicare HMO | Admitting: Internal Medicine

## 2023-09-22 ENCOUNTER — Telehealth: Payer: Self-pay | Admitting: Internal Medicine

## 2023-09-22 ENCOUNTER — Encounter (HOSPITAL_COMMUNITY): Payer: Self-pay

## 2023-09-22 ENCOUNTER — Telehealth: Payer: Self-pay

## 2023-09-22 MED ORDER — OXYCODONE HCL 10 MG PO TABS: 10.0000 mg | ORAL_TABLET | Freq: Three times a day (TID) | ORAL | 0 refills | Status: DC | PRN

## 2023-09-22 NOTE — Addendum Note (Signed)
Addended by: Corwin Levins on: 09/22/2023 04:19 PM   Modules accepted: Orders

## 2023-09-22 NOTE — Telephone Encounter (Signed)
Pt called wanting to speak with a nurse about her medications pt was short and release call

## 2023-09-22 NOTE — Telephone Encounter (Signed)
Pt has stated she is due for her rx refill for her Oxycodone HCl 10 MG TABS  today as it was a 30 day supply and there was 31 days in the month of October. Provider has accidentally put 10/03/2023 on rx...  Can you please send in refill of pts Oxycodone HCl 10 MG TABS  to be filled today?  Please send refill over to pharmacy on file...  [facility] [id number] [place], [place] - [address], [place] [address]

## 2023-09-22 NOTE — Telephone Encounter (Signed)
Patient's husband Samantha [name] called regarding patient's  Oxycodone HCl 10 MG TABS. They were informed the medication could not be filled until 10/03/2023 per provider instructions. They would like to know why it is not available now. Patient would like a call back at [phone]. They said they can also be reached at [facility] number [phone].

## 2023-09-23 ENCOUNTER — Other Ambulatory Visit: Payer: Self-pay | Admitting: Internal Medicine

## 2023-09-25 ENCOUNTER — Telehealth: Payer: Self-pay | Admitting: Internal Medicine

## 2023-09-25 NOTE — Telephone Encounter (Signed)
Patient needs Samantha [name] to call her concerning her medications.  Does not need refill now - just has concerns.

## 2023-09-26 ENCOUNTER — Ambulatory Visit: Payer: Medicare HMO | Admitting: Internal Medicine

## 2023-09-26 NOTE — Telephone Encounter (Signed)
This prescription was made to be available on 09/22/2023 according to our record.  Thank you

## 2023-09-27 ENCOUNTER — Ambulatory Visit (HOSPITAL_COMMUNITY): Admission: RE | Admit: 2023-09-27 | Payer: Medicare HMO | Source: Ambulatory Visit

## 2023-09-27 NOTE — Telephone Encounter (Signed)
Tried calling pt no answer/ Please try to get more info on the matter if pt calls back.

## 2023-10-05 ENCOUNTER — Telehealth: Payer: Self-pay | Admitting: Cardiology

## 2023-10-05 NOTE — Telephone Encounter (Signed)
Patient is calling wanting to know if Dr. Servando Salina can call her insurance to attempt to do a peer to peer to get her CT order authorized. Please advise.

## 2023-10-05 NOTE — Telephone Encounter (Signed)
Attempted to call patient with no answer to find out insurance. Left message to return call.

## 2023-10-06 ENCOUNTER — Telehealth: Payer: Self-pay | Admitting: Cardiology

## 2023-10-06 NOTE — Telephone Encounter (Signed)
Patient returned staff call. 

## 2023-10-06 NOTE — Telephone Encounter (Signed)
 Attempted to call patient, no answer left message requesting a call back.

## 2023-10-06 NOTE — Telephone Encounter (Signed)
Spoke with pt, aware there is a note on the order that the CT scan is in the appeal process. She would like to know if there is anything else we can do. Will forward to pre-cert department for any new information.

## 2023-10-07 ENCOUNTER — Other Ambulatory Visit: Payer: Self-pay | Admitting: Internal Medicine

## 2023-10-09 ENCOUNTER — Encounter: Payer: Self-pay | Admitting: Cardiology

## 2023-10-09 NOTE — Telephone Encounter (Signed)
2nd attempt to call patient, no answer left message requesting a call back.

## 2023-10-09 NOTE — Telephone Encounter (Signed)
Hepler, Amanda  Cv Div Nl Triage; Cv Div Heartcare Pre Cert/Auth4 minutes ago (9:14 AM)   AH Good morning,  An appeal has been submitted for approval this morning. Thank you.  Amand

## 2023-10-10 NOTE — Telephone Encounter (Signed)
3rd attempt to call patient, no answer, left message requesting a call back Nursing will await for patient to return call

## 2023-10-10 NOTE — Telephone Encounter (Signed)
Patient is returning call.  °

## 2023-10-10 NOTE — Telephone Encounter (Signed)
Spoke with pt, aware we are in the appeals process with her insurance.

## 2023-10-10 NOTE — Telephone Encounter (Signed)
Per chart review pt has already received a call back. Closing this encounter.

## 2023-10-15 ENCOUNTER — Encounter: Payer: Self-pay | Admitting: Internal Medicine

## 2023-10-15 MED ORDER — DULOXETINE HCL 60 MG PO CPEP: ORAL_CAPSULE | ORAL | 3 refills | Status: DC

## 2023-10-15 MED ORDER — ONDANSETRON HCL 4 MG PO TABS: 4.0000 mg | ORAL_TABLET | Freq: Three times a day (TID) | ORAL | 1 refills | Status: DC | PRN

## 2023-10-15 MED ORDER — BUSPIRONE HCL 10 MG PO TABS: 10.0000 mg | ORAL_TABLET | Freq: Three times a day (TID) | ORAL | 3 refills | Status: DC

## 2023-10-15 MED ORDER — PANTOPRAZOLE SODIUM 40 MG PO TBEC: 40.0000 mg | DELAYED_RELEASE_TABLET | Freq: Two times a day (BID) | ORAL | 3 refills | Status: DC

## 2023-10-15 MED ORDER — GABAPENTIN 600 MG PO TABS: ORAL_TABLET | ORAL | 1 refills | Status: DC

## 2023-10-15 MED ORDER — FAMOTIDINE 40 MG PO TABS: 40.0000 mg | ORAL_TABLET | Freq: Every day | ORAL | 3 refills | Status: DC

## 2023-10-15 MED ORDER — OLMESARTAN MEDOXOMIL 20 MG PO TABS: 20.0000 mg | ORAL_TABLET | Freq: Every day | ORAL | 3 refills | Status: AC

## 2023-10-15 MED ORDER — LAMOTRIGINE 100 MG PO TABS: ORAL_TABLET | ORAL | 3 refills | Status: DC

## 2023-10-16 ENCOUNTER — Telehealth: Payer: Self-pay | Admitting: Internal Medicine

## 2023-10-16 NOTE — Telephone Encounter (Signed)
Dr. Posey Rea called in patients monthly hydrocodone but forgot to send the morphine tablets.  Please send to [facility] on [address]

## 2023-10-17 ENCOUNTER — Other Ambulatory Visit: Payer: Self-pay | Admitting: *Deleted

## 2023-10-17 MED ORDER — OXYCODONE HCL 10 MG PO TABS: 10.0000 mg | ORAL_TABLET | Freq: Three times a day (TID) | ORAL | 0 refills | Status: DC | PRN

## 2023-10-17 MED ORDER — MORPHINE SULFATE ER 30 MG PO TBCR: 30.0000 mg | EXTENDED_RELEASE_TABLET | Freq: Two times a day (BID) | ORAL | 0 refills | Status: DC

## 2023-10-17 NOTE — Telephone Encounter (Signed)
Both prescriptions were issued in November and both prescriptions were issued in December.  See prescriptions.  Thank you

## 2023-10-17 NOTE — Progress Notes (Signed)
amyloid

## 2023-10-18 ENCOUNTER — Telehealth: Payer: Self-pay | Admitting: Cardiology

## 2023-10-18 ENCOUNTER — Telehealth: Payer: Self-pay | Admitting: Internal Medicine

## 2023-10-18 NOTE — Telephone Encounter (Signed)
Pt called stating that these 2 medications suppose to be filled on the 8 of the month instead of the 10th.

## 2023-10-18 NOTE — Telephone Encounter (Signed)
Patient identification verified by 2 forms. Marilynn Rail, RN    Called and spoke to patient  Patient states: -Received letter from [employer], a company that completes PA for her insurance  -Letter states Dr. Servando Salina had withdrawn request for CT  -Would like to know if this test still needs to be completed  Informed patient message sent for assistance  Patient verbalized understanding, no questions at this time

## 2023-10-18 NOTE — Telephone Encounter (Signed)
Error

## 2023-10-18 NOTE — Telephone Encounter (Signed)
Patient called to follow-up on orders for CT CORONARY MORPH test.  Patient stated she believes the order has been withdrawn.

## 2023-10-19 NOTE — Telephone Encounter (Signed)
Pt called stating that these 2 medications suppose to be filled on the 8 of the month instead of the 10th.

## 2023-10-19 NOTE — Telephone Encounter (Signed)
Patient identification verified by 2 forms. Marilynn Rail, RN    Called and spoke to patient  Informed patient Dr. Servando Salina would like her to still complete due to abnormal ECG, office if currently completing an appeal  Patient verbalized understanding, no questions at this time

## 2023-10-19 NOTE — Telephone Encounter (Signed)
Pt also stated she is about to run out of medication.

## 2023-10-20 MED ORDER — OXYCODONE HCL 10 MG PO TABS: 10.0000 mg | ORAL_TABLET | Freq: Three times a day (TID) | ORAL | 0 refills | Status: DC | PRN

## 2023-10-20 MED ORDER — MORPHINE SULFATE ER 30 MG PO TBCR: 30.0000 mg | EXTENDED_RELEASE_TABLET | Freq: Two times a day (BID) | ORAL | 0 refills | Status: DC

## 2023-10-20 NOTE — Telephone Encounter (Signed)
Okay.  Her meds can be filled every 30 days.  Thanks

## 2023-10-20 NOTE — Addendum Note (Signed)
Addended by: Tresa Garter on: 10/20/2023 09:04 AM   Modules accepted: Orders

## 2023-10-25 ENCOUNTER — Other Ambulatory Visit: Payer: Self-pay | Admitting: Internal Medicine

## 2023-10-25 ENCOUNTER — Telehealth: Payer: Self-pay | Admitting: Cardiology

## 2023-10-25 NOTE — Telephone Encounter (Signed)
Pt is requesting a callback regarding her receiving a call yesterday but she'd not sure from who. She deleted the VM but it may have been referring to the appeal. Please advise

## 2023-10-26 ENCOUNTER — Encounter: Payer: Self-pay | Admitting: *Deleted

## 2023-10-27 DIAGNOSIS — M1711 Unilateral primary osteoarthritis, right knee: Secondary | ICD-10-CM | POA: Diagnosis not present

## 2023-11-02 ENCOUNTER — Other Ambulatory Visit: Payer: Self-pay | Admitting: Internal Medicine

## 2023-11-07 ENCOUNTER — Telehealth (HOSPITAL_COMMUNITY): Payer: Self-pay | Admitting: *Deleted

## 2023-11-07 NOTE — Telephone Encounter (Signed)
Attempted to call patient regarding upcoming cardiac CT appointment. °Left message on voicemail with name and callback number ° °Edie Vallandingham RN Navigator Cardiac Imaging °[facility] °[phone] Office °[phone] Cell ° °

## 2023-11-07 NOTE — Telephone Encounter (Signed)
Patient returning call about her upcoming cardiac imaging study; pt verbalizes understanding of appt date/time, parking situation and where to check in, pre-test NPO status and medications ordered, and verified current allergies; name and call back number provided for further questions should they arise  Larey Brick RN Navigator Cardiac Imaging [facility] [phone] office [phone] cell  Patient to take 100mg  metoprolol tartrate two hours prior to her cardiac CT scan. She is aware to arrive at 2 PM.

## 2023-11-09 ENCOUNTER — Ambulatory Visit (HOSPITAL_COMMUNITY)
Admission: RE | Admit: 2023-11-09 | Discharge: 2023-11-09 | Disposition: A | Discharge status: Home / Self Care | Payer: Medicare HMO | Source: Ambulatory Visit | Attending: Cardiology | Admitting: Cardiology

## 2023-11-09 DIAGNOSIS — R9431 Abnormal electrocardiogram [ECG] [EKG]: Secondary | ICD-10-CM | POA: Diagnosis not present

## 2023-11-09 LAB — POCT I-STAT CREATININE: Creatinine, Ser: 1.1 mg/dL — ABNORMAL HIGH (ref 0.44–1.00)

## 2023-11-09 MED ORDER — NITROGLYCERIN 0.4 MG SL SUBL
SUBLINGUAL_TABLET | SUBLINGUAL | Status: AC
  Filled 2023-11-09: qty 2

## 2023-11-09 MED ORDER — IOHEXOL 350 MG/ML SOLN
95.0000 mL | Freq: Once | INTRAVENOUS | Status: AC | PRN
  Administered 2023-11-09: 95 mL via INTRAVENOUS

## 2023-11-09 MED ORDER — NITROGLYCERIN 0.4 MG SL SUBL
0.8000 mg | SUBLINGUAL_TABLET | Freq: Once | SUBLINGUAL | Status: AC
Start: 2023-11-09 — End: 2023-11-09
  Administered 2023-11-09: 0.8 mg via SUBLINGUAL

## 2023-11-14 ENCOUNTER — Ambulatory Visit (INDEPENDENT_AMBULATORY_CARE_PROVIDER_SITE_OTHER): Payer: Medicare HMO | Admitting: Internal Medicine

## 2023-11-14 VITALS — BP 110/70 | HR 76 | Temp 98.3°F | Ht <= 58 in | Wt 143.0 lb

## 2023-11-14 DIAGNOSIS — R5382 Chronic fatigue, unspecified: Secondary | ICD-10-CM

## 2023-11-14 DIAGNOSIS — R413 Other amnesia: Secondary | ICD-10-CM

## 2023-11-14 DIAGNOSIS — G8929 Other chronic pain: Secondary | ICD-10-CM | POA: Diagnosis not present

## 2023-11-14 DIAGNOSIS — I10 Essential (primary) hypertension: Secondary | ICD-10-CM | POA: Diagnosis not present

## 2023-11-14 MED ORDER — MORPHINE SULFATE ER 30 MG PO TBCR: 30.0000 mg | EXTENDED_RELEASE_TABLET | Freq: Two times a day (BID) | ORAL | 0 refills | Status: DC

## 2023-11-14 MED ORDER — LINACLOTIDE 290 MCG PO CAPS: 290.0000 ug | ORAL_CAPSULE | Freq: Every day | ORAL | 3 refills | Status: DC

## 2023-11-14 MED ORDER — OXYCODONE HCL 10 MG PO TABS: 10.0000 mg | ORAL_TABLET | Freq: Three times a day (TID) | ORAL | 0 refills | Status: DC | PRN

## 2023-11-14 NOTE — Assessment & Plan Note (Addendum)
Post-thalamic CVA pain syndrome Chronic pain in the back, hips - I took over Samantha [name]'s pain management (she was treated at [facility] in the past). On Oxycodone and MS Contin   Potential benefits of a long term opioids use as well as potential risks (i.e. addiction risk, apnea etc) and complications (i.e. Somnolence, constipation and others) were explained to the patient and were aknowledged.

## 2023-11-14 NOTE — Assessment & Plan Note (Signed)
Elevated BP/HR - on - resolved

## 2023-11-14 NOTE — Progress Notes (Signed)
 Subjective:  Patient ID: Samantha [name], female    DOB: 04/15/1966  Age: 57 y.o. MRN: [id number]  CC: Medical Management of Chronic Issues   HPI Samantha [name] presents for HTN, LBP, depression   Outpatient Medications Prior to Visit  Medication Sig Dispense Refill   amLODipine  (NORVASC ) 2.5 MG tablet Take 1 tablet (2.5 mg total) by mouth daily. 90 tablet 3   atorvastatin  (LIPITOR) 20 MG tablet TAKE 1 TABLET EVERY DAY AT 6 PM 90 tablet 3   busPIRone  (BUSPAR ) 10 MG tablet Take 1 tablet (10 mg total) by mouth 3 (three) times daily. 270 tablet 3   Calcium  Carb-Cholecalciferol (CALCIUM +D3 PO) Take 1 tablet by mouth 2 (two) times daily with a meal.     Docusate Calcium  (STOOL SOFTENER PO) Take 1 capsule by mouth 2 (two) times daily as needed (for constipation).     donepezil  (ARICEPT ) 10 MG tablet TAKE 1 TABLET AT BEDTIME 90 tablet 3   DULoxetine  (CYMBALTA ) 60 MG capsule TAKE 1 CAPSULE EVERY DAY 90 capsule 3   famotidine  (PEPCID ) 40 MG tablet Take 1 tablet (40 mg total) by mouth daily. 90 tablet 3   famotidine  (PEPCID ) 40 MG tablet TAKE 1 TABLET EVERY DAY 90 tablet 3   Ferric Maltol  (ACCRUFER ) 30 MG CAPS Take 1 capsule (30 mg total) by mouth 2 (two) times daily. 180 capsule 2   gabapentin  (NEURONTIN ) 600 MG tablet Take 1 tablet 3 times a day 270 tablet 1   lactulose  (CHRONULAC ) 10 GM/15ML solution Take 30-45 mLs (20-30 g total) by mouth 2 (two) times daily as needed for moderate constipation or severe constipation. 946 mL 5   lamoTRIgine  (LAMICTAL ) 100 MG tablet TAKE 1 TABLET EVERY MORNING AND 1 AND 1/2 TABLETS AT NIGHT 225 tablet 3   lidocaine  (LIDODERM ) 5 % Place 1-2 patches onto the skin daily. Remove & Discard patch within 12 hours or as directed by MD 180 patch 1   memantine  (NAMENDA ) 5 MG tablet TAKE 1 TABLET TWICE DAILY 180 tablet 3   metoprolol  tartrate (LOPRESSOR ) 100 MG tablet Take 2 hours prior to CT 1 tablet 0   olmesartan  (BENICAR ) 20 MG tablet Take 1 tablet (20 mg total) by  mouth daily. 90 tablet 3   ondansetron  (ZOFRAN ) 4 MG tablet TAKE 1 TABLET EVERY 8 HOURS AS NEEDED FOR NAUSEA OR VOMITING. 90 tablet 3   pantoprazole  (PROTONIX ) 40 MG tablet Take 1 tablet (40 mg total) by mouth 2 (two) times daily before a meal. 180 tablet 3   Probiotic Product (ALIGN) 4 MG CAPS Take 1 capsule (4 mg total) by mouth daily. (Patient taking differently: Take 4 mg by mouth daily as needed (for stomach or constipation issues).) 90 capsule 1   promethazine  (PHENERGAN ) 12.5 MG tablet Take 1 tablet (12.5 mg total) by mouth every 6 (six) hours as needed for nausea or vomiting. 40 tablet 0   busPIRone  (BUSPAR ) 10 MG tablet TAKE 1 TABLET THREE TIMES DAILY 270 tablet 3   DULoxetine  (CYMBALTA ) 60 MG capsule TAKE 1 CAPSULE EVERY DAY 90 capsule 3   Lidocaine  HCl (ASPERCREME LIDOCAINE  ESSENTIAL) 4 % LIQD Apply 1 application  topically 2 (two) times daily as needed (to painful sites).     LINZESS  290 MCG CAPS capsule TAKE 1 CAPSULE DAILY BEFORE BREAKFAST 30 capsule 11   morphine  (MS CONTIN ) 30 MG 12 hr tablet Take 1 tablet (30 mg total) by mouth every 12 (twelve) hours. 60 tablet 0   Oxycodone  HCl  10 MG TABS Take 1 tablet (10 mg total) by mouth 3 (three) times daily as needed. 90 tablet 0   pantoprazole  (PROTONIX ) 40 MG tablet TAKE 1 TABLET TWICE DAILY BEFORE MEALS 180 tablet 3   No facility-administered medications prior to visit.    ROS: Review of Systems  Constitutional:  Negative for activity change, appetite change, chills, fatigue and unexpected weight change.  HENT:  Negative for congestion, mouth sores and sinus pressure.   Eyes:  Negative for visual disturbance.  Respiratory:  Negative for cough and chest tightness.   Gastrointestinal:  Negative for abdominal pain and nausea.  Genitourinary:  Negative for difficulty urinating, frequency and vaginal pain.  Musculoskeletal:  Positive for arthralgias and back pain. Negative for gait problem.  Skin:  Negative for pallor and rash.   Neurological:  Negative for dizziness, tremors, weakness, numbness and headaches.  Psychiatric/Behavioral:  Positive for decreased concentration. Negative for confusion and sleep disturbance.     Objective:  BP 110/70 (BP Location: Left Arm, Patient Position: Sitting, Cuff Size: Normal)   Pulse 76   Temp 98.3 F (36.8 C) (Oral)   Ht 4' 10 (1.473 m)   Wt 143 lb (64.9 kg)   LMP 11/24/2016   SpO2 97%   BMI 29.89 kg/m   BP Readings from Last 3 Encounters:  11/14/23 110/70  11/09/23 (!) 113/59  09/20/23 122/70    Wt Readings from Last 3 Encounters:  11/14/23 143 lb (64.9 kg)  09/20/23 141 lb (64 kg)  09/07/23 145 lb (65.8 kg)    Physical Exam Constitutional:      General: She is not in acute distress.    Appearance: She is well-developed. She is obese.  HENT:     Head: Normocephalic.     Right Ear: External ear normal.     Left Ear: External ear normal.     Nose: Nose normal.  Eyes:     General:        Right eye: No discharge.        Left eye: No discharge.     Conjunctiva/sclera: Conjunctivae normal.     Pupils: Pupils are equal, round, and reactive to light.  Neck:     Thyroid : No thyromegaly.     Vascular: No JVD.     Trachea: No tracheal deviation.  Cardiovascular:     Rate and Rhythm: Normal rate and regular rhythm.     Heart sounds: Normal heart sounds.  Pulmonary:     Effort: No respiratory distress.     Breath sounds: No stridor. No wheezing.  Abdominal:     General: Bowel sounds are normal. There is no distension.     Palpations: Abdomen is soft. There is no mass.     Tenderness: There is no abdominal tenderness. There is no guarding or rebound.  Musculoskeletal:        General: Tenderness present.     Cervical back: Normal range of motion and neck supple. No rigidity.  Lymphadenopathy:     Cervical: No cervical adenopathy.  Skin:    Findings: No erythema or rash.  Neurological:     Mental Status: She is oriented to person, place, and time.  Mental status is at baseline.     Cranial Nerves: No cranial nerve deficit.     Motor: No abnormal muscle tone.     Coordination: Coordination normal.     Gait: Gait abnormal.     Deep Tendon Reflexes: Reflexes normal.  Psychiatric:  Behavior: Behavior normal.        Thought Content: Thought content normal.        Judgment: Judgment normal.     Lab Results  Component Value Date   WBC 6.2 06/05/2023   HGB 12.4 06/05/2023   HCT 37.6 06/05/2023   PLT 397.0 06/05/2023   GLUCOSE 88 09/06/2023   CHOL 217 (H) 09/02/2021   TRIG 204.0 (H) 09/02/2021   HDL 52.80 09/02/2021   LDLDIRECT 120.0 09/02/2021   LDLCALC 85 07/16/2020   ALT 27 09/06/2023   AST 32 09/06/2023   NA 142 09/06/2023   K 4.4 09/06/2023   CL 104 09/06/2023   CREATININE 1.10 (H) 11/09/2023   BUN 19 09/06/2023   CO2 27 09/06/2023   TSH 3.09 09/02/2021   INR 1.1 02/28/2023   HGBA1C 5.7 01/12/2022    CT CORONARY MORPH W/CTA COR W/SCORE W/CA W/CM &/OR WO/CM Result Date: 11/10/2023 CLINICAL DATA:  89F with abnormal EKG EXAM: Cardiac/Coronary CTA TECHNIQUE: The patient was scanned on a [employer]. FINDINGS: A 100 kV prospective scan was triggered in the descending thoracic aorta at 111 HU's. Axial non-contrast 3 mm slices were carried out through the heart. The data set was analyzed on a dedicated work station and scored using the Agatson method. Gantry rotation speed was 250 msecs and collimation was .6 mm. No beta blockade and 0.8 mg of sl NTG was given. The 3D data set was reconstructed in 5% intervals of the 35-75% of the R-R cycle. Phases were analyzed on a dedicated work station using MPR, MIP and VRT modes. The patient received 100 cc of contrast. Coronary Arteries:  Normal coronary origin.  Right dominance. RCA is a large dominant artery that gives rise to PDA and PLA. There is no plaque. Left main is a large artery that gives rise to LAD and LCX arteries. LAD is a large vessel that has no plaque. LCX  is a non-dominant artery that gives rise to one large OM1 branch. Noncalcified plaque in mid LCX causes mild (25-49%) stenosis Other findings: Left Ventricle: Normal size Left Atrium: Normal size Pulmonary Veins: Normal configuration Right Ventricle: Normal size Right Atrium: Normal size Cardiac valves: No calcifications Thoracic aorta: Normal size Pulmonary Arteries: Normal size Systemic Veins: Normal drainage Pericardium: Normal thickness IMPRESSION: 1.  Coronary calcium  score of 0. 2. Total plaque volume 10mm3 which is 30th percentile for age and sex-matched controls (calcified plaque 44mm3; noncalcified plaque 15mm3). TPV is mild 3.  Normal coronary origin with right dominance. 4.  Nonobstructive CAD 5.  Noncalcified plaque in mid LCX causes mild (25-49%) stenosis CAD-RADS 2. Mild non-obstructive CAD (25-49%). Consider non-atherosclerotic causes of chest pain. Consider preventive therapy and risk factor modification. Electronically Signed   By: Lonni Nanas M.D.   On: 11/10/2023 14:34    Assessment & Plan:   Problem List Items Addressed This Visit     Chronic pain - Primary    Post-thalamic CVA pain syndrome Chronic pain in the back, hips - I took over Samantha [name]'s pain management (she was treated at [facility] in the past). On Oxycodone  and MS Contin    Potential benefits of a long term opioids use as well as potential risks (i.e. addiction risk, apnea etc) and complications (i.e. Somnolence, constipation and others) were explained to the patient and were aknowledged.        Relevant Medications   morphine  (MS CONTIN ) 30 MG 12 hr tablet   Oxycodone  HCl 10 MG TABS  Fatigue   Samantha [name] needs to exercise       Memory loss   Walk more [place] for insomnia Oneil or [facility]lam are giving Samantha [name] her pain meds; using a safe      HTN (hypertension)   Elevated BP/HR - on - resolved         Meds ordered this encounter  Medications   linaclotide  (LINZESS ) 290 MCG CAPS  capsule    Sig: Take 1 capsule (290 mcg total) by mouth daily before breakfast.    Dispense:  90 capsule    Refill:  3   morphine  (MS CONTIN ) 30 MG 12 hr tablet    Sig: Take 1 tablet (30 mg total) by mouth every 12 (twelve) hours.    Dispense:  60 tablet    Refill:  0    Please fill on or after 11/21/23   Oxycodone  HCl 10 MG TABS    Sig: Take 1 tablet (10 mg total) by mouth 3 (three) times daily as needed.    Dispense:  90 tablet    Refill:  0    Please fill on or after 11/21/23      Follow-up: Return in about 4 weeks (around 12/12/2023) for a follow-up visit.  Marolyn Noel, MD

## 2023-11-14 NOTE — Assessment & Plan Note (Signed)
Samantha [name] needs to exercise

## 2023-11-14 NOTE — Assessment & Plan Note (Signed)
Walk more [place] for insomnia Mark or [name] are giving [name] her pain meds; using a safe

## 2023-11-15 NOTE — Addendum Note (Signed)
 Addended by: Tresa Garter on: 11/15/2023 09:16 PM   Modules accepted: Level of Service

## 2023-12-08 ENCOUNTER — Encounter: Payer: Self-pay | Admitting: Cardiology

## 2023-12-08 ENCOUNTER — Ambulatory Visit: Payer: Medicare PPO | Attending: Cardiology | Admitting: Cardiology

## 2023-12-08 ENCOUNTER — Telehealth: Payer: Self-pay | Admitting: Cardiology

## 2023-12-08 VITALS — BP 160/84 | HR 84 | Ht <= 58 in | Wt 145.8 lb

## 2023-12-08 DIAGNOSIS — E785 Hyperlipidemia, unspecified: Secondary | ICD-10-CM

## 2023-12-08 DIAGNOSIS — I2510 Atherosclerotic heart disease of native coronary artery without angina pectoris: Secondary | ICD-10-CM | POA: Diagnosis not present

## 2023-12-08 DIAGNOSIS — R7303 Prediabetes: Secondary | ICD-10-CM

## 2023-12-08 DIAGNOSIS — I10 Essential (primary) hypertension: Secondary | ICD-10-CM

## 2023-12-08 NOTE — Progress Notes (Signed)
Cardiology Office Note:    Date:  12/08/2023   ID:  Samantha [name], DOB July 31, 1966, MRN [id number]  PCP:  Tresa Garter, MD  Cardiologist:  Thomasene Ripple, DO  Electrophysiologist:  None   Referring MD: Tresa Garter, MD   " I am ok"  History of Present Illness:    Samantha [name] is a 58 y.o. female with history of hiatal hernia and an ulcer, was referred to cardiology for preoperative clearance before undergoing surgery. The patient has been experiencing dizziness, particularly upon waking and before going to bed, for approximately two weeks. The onset of these symptoms coincides with a recent change in blood pressure medication from Olmesartan to Amlodipine. The patient denies any chest pain. The patient has a significant family history of heart disease, with both parents having suffered heart attacks. The patient's father had his first heart attack at the age of 54 and died from a massive heart attack at the age of 63. The patient's mother's father also died from a heart attack. The patient's EKG shows abnormal results, with flipped T waves in the anterior leads, suggesting potential strain on the heart muscle.   Past Medical History:  Diagnosis Date   Anxiety    Sheria Lang lesion, acute    Cerebral ventricular shunt fitting or adjustment    CKD (chronic kidney disease), stage III (HCC)    COMMON MIGRAINE 10/01/2007   Chronic     Depression    Esophageal ulceration    GERD (gastroesophageal reflux disease)    GI bleed    GLUCOSE INTOLERANCE 10/01/2007   Qualifier: Diagnosis of  By: Jonny Ruiz MD, Len Blalock    Hiatal hernia    Hydrocephalus (HCC)    Hyperlipidemia    HYPERLIPIDEMIA 10/01/2007   Chronic. Lovaza is too $$$ Declined statins due to worries    IDA (iron deficiency anemia)    Memory loss    Migraine    OBSTRUCTIVE SLEEP APNEA 10/01/2007   NPSG 2006:  AHI 9/hr Started cpap 2009 successfully.      Paresthesia    Prolonged QT interval    Schatzki's ring     Sleep apnea    Stroke (HCC) 03/14/2013    Past Surgical History:  Procedure Laterality Date   Ames Shunt  1976,   cerebral vascular shunt/ with a revision 1981   BIOPSY  05/02/2022   Procedure: BIOPSY;  Surgeon: Imogene Burn, MD;  Location: [facility] ENDOSCOPY;  Service: Gastroenterology;;   CHOLECYSTECTOMY  1981   CYST REMOVAL NECK     ESOPHAGEAL MANOMETRY N/A 05/03/2023   Procedure: ESOPHAGEAL MANOMETRY (EM);  Surgeon: Beverley Fiedler, MD;  Location: [facility] ENDOSCOPY;  Service: Gastroenterology;  Laterality: N/A;   ESOPHAGOGASTRODUODENOSCOPY (EGD) WITH PROPOFOL N/A 05/02/2022   Procedure: ESOPHAGOGASTRODUODENOSCOPY (EGD) WITH PROPOFOL;  Surgeon: Imogene Burn, MD;  Location[facility] ENDOSCOPY;  Service: Gastroenterology;  Laterality: N/A;   ESOPHAGOGASTRODUODENOSCOPY (EGD) WITH PROPOFOL N/A 03/01/2023   Procedure: ESOPHAGOGASTRODUODENOSCOPY (EGD) WITH PROPOFOL;  Surgeon: Beverley Fiedler, MD;  Location: [facility] ENDOSCOPY;  Service: Gastroenterology;  Laterality: N/A;   HEMOSTASIS CLIP PLACEMENT  03/01/2023   Procedure: HEMOSTASIS CLIP PLACEMENT;  Surgeon: Beverley Fiedler, MD;  Location: [facility] ENDOSCOPY;  Service: Gastroenterology;;   HEMOSTASIS CONTROL  03/01/2023   Procedure: HEMOSTASIS CONTROL;  Surgeon: Beverley Fiedler, MD;  Location: [facility] ENDOSCOPY;  Service: Gastroenterology;;   Pituitary cyst w/subsequent shunt      Current Medications: Current Meds  Medication Sig   amLODipine (NORVASC) 2.5  MG tablet Take 1 tablet (2.5 mg total) by mouth daily.   atorvastatin (LIPITOR) 20 MG tablet TAKE 1 TABLET EVERY DAY AT 6 PM   busPIRone (BUSPAR) 10 MG tablet Take 1 tablet (10 mg total) by mouth 3 (three) times daily.   Calcium Carb-Cholecalciferol (CALCIUM+D3 PO) Take 1 tablet by mouth 2 (two) times daily with a meal.   Docusate Calcium (STOOL SOFTENER PO) Take 1 capsule by mouth 2 (two) times daily as needed (for constipation).   donepezil (ARICEPT) 10 MG tablet TAKE 1 TABLET AT BEDTIME   DULoxetine (CYMBALTA) 60 MG  capsule TAKE 1 CAPSULE EVERY DAY   famotidine (PEPCID) 40 MG tablet Take 1 tablet (40 mg total) by mouth daily.   famotidine (PEPCID) 40 MG tablet TAKE 1 TABLET EVERY DAY   Ferric Maltol (ACCRUFER) 30 MG CAPS Take 1 capsule (30 mg total) by mouth 2 (two) times daily.   gabapentin (NEURONTIN) 600 MG tablet Take 1 tablet 3 times a day   lactulose (CHRONULAC) 10 GM/15ML solution Take 30-45 mLs (20-30 g total) by mouth 2 (two) times daily as needed for moderate constipation or severe constipation.   lamoTRIgine (LAMICTAL) 100 MG tablet TAKE 1 TABLET EVERY MORNING AND 1 AND 1/2 TABLETS AT NIGHT   lidocaine (LIDODERM) 5 % Place 1-2 patches onto the skin daily. Remove & Discard patch within 12 hours or as directed by MD   linaclotide (LINZESS) 290 MCG CAPS capsule Take 1 capsule (290 mcg total) by mouth daily before breakfast.   memantine (NAMENDA) 5 MG tablet TAKE 1 TABLET TWICE DAILY   morphine (MS CONTIN) 30 MG 12 hr tablet Take 1 tablet (30 mg total) by mouth every 12 (twelve) hours.   olmesartan (BENICAR) 20 MG tablet Take 1 tablet (20 mg total) by mouth daily.   ondansetron (ZOFRAN) 4 MG tablet TAKE 1 TABLET EVERY 8 HOURS AS NEEDED FOR NAUSEA OR VOMITING.   Oxycodone HCl 10 MG TABS Take 1 tablet (10 mg total) by mouth 3 (three) times daily as needed.   pantoprazole (PROTONIX) 40 MG tablet Take 1 tablet (40 mg total) by mouth 2 (two) times daily before a meal.   Probiotic Product (ALIGN) 4 MG CAPS Take 1 capsule (4 mg total) by mouth daily. (Patient taking differently: Take 4 mg by mouth daily as needed (for stomach or constipation issues).)   promethazine (PHENERGAN) 12.5 MG tablet Take 1 tablet (12.5 mg total) by mouth every 6 (six) hours as needed for nausea or vomiting.     Allergies:   Alprazolam, Citalopram hydrobromide, and Olmesartan   Social History   Socioeconomic History   Marital status: Married    Spouse name: [name]   Number of children: 0   Years of education: 12   Highest  education level: 12th grade  Occupational History   Occupation: disabled  Tobacco Use   Smoking status: Never   Smokeless tobacco: Never  Vaping Use   Vaping status: Never Used  Substance and Sexual Activity   Alcohol use: Yes    Alcohol/week: 1.0 standard drink of alcohol    Types: 1 Glasses of wine per week    Comment: Social   Drug use: No   Sexual activity: Yes  Other Topics Concern   Not on file  Social History Narrative   Mother is in a NH.    Patient lives at home with her husband [name]). 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    High school education.  Right handed.   Caffeine 1 cup daily.   She has no children   Social Drivers of Corporate investment banker Strain: Low Risk  (11/14/2023)   Overall Financial Resource Strain (CARDIA)    Difficulty of Paying Living Expenses: Not hard at all  Food Insecurity: No Food Insecurity (11/14/2023)   Hunger Vital Sign    Worried About Running Out of Food in the Last Year: Never true    Ran Out of Food in the Last Year: Never true  Transportation Needs: No Transportation Needs (11/14/2023)   PRAPARE - Administrator, Civil Service (Medical): No    Lack of Transportation (Non-Medical): No  Physical Activity: Insufficiently Active (11/14/2023)   Exercise Vital Sign    Days of Exercise per Week: 2 days    Minutes of Exercise per Session: 10 min  Stress: No Stress Concern Present (11/14/2023)   [employer] of Occupational Health - Occupational Stress Questionnaire    Feeling of Stress : Only a little  Social Connections: Moderately Integrated (11/14/2023)   Social Connection and Isolation Panel [NHANES]    Frequency of Communication with Friends and Family: More than three times a week    Frequency of Social Gatherings with Friends and Family: Twice a week    Attends Religious Services: More than 4 times per year    Active Member of [employer] or Organizations: No    Attends Banker  Meetings: Never    Marital Status: Married  Recent Concern: Social Connections - Moderately Isolated (09/07/2023)   Social Connection and Isolation Panel [NHANES]    Frequency of Communication with Friends and Family: More than three times a week    Frequency of Social Gatherings with Friends and Family: More than three times a week    Attends Religious Services: Never    Database administrator or Organizations: No    Attends Engineer, structural: Never    Marital Status: Married     Family History: The patient's family history includes Alzheimer's disease in her mother and paternal grandmother; Brain cancer in her maternal aunt; Colon polyps in her mother; Dementia in her mother; Diabetes in her father; Esophageal cancer in her maternal grandmother; Heart disease in her father; Kidney cancer in her mother; Lung cancer in her maternal uncle and other family members; Melanoma in an other family member; Migraines in her mother. There is no history of Colon cancer, Rectal cancer, or Stomach cancer.  ROS:   Review of Systems  Constitution: Negative for decreased appetite, fever and weight gain.  HENT: Negative for congestion, ear discharge, hoarse voice and sore throat.   Eyes: Negative for discharge, redness, vision loss in right eye and visual halos.  Cardiovascular: Negative for chest pain, dyspnea on exertion, leg swelling, orthopnea and palpitations.  Respiratory: Negative for cough, hemoptysis, shortness of breath and snoring.   Endocrine: Negative for heat intolerance and polyphagia.  Hematologic/Lymphatic: Negative for bleeding problem. Does not bruise/bleed easily.  Skin: Negative for flushing, nail changes, rash and suspicious lesions.  Musculoskeletal: Negative for arthritis, joint pain, muscle cramps, myalgias, neck pain and stiffness.  Gastrointestinal: Negative for abdominal pain, bowel incontinence, diarrhea and excessive appetite.  Genitourinary: Negative for decreased  libido, genital sores and incomplete emptying.  Neurological: Negative for brief paralysis, focal weakness, headaches and loss of balance.  Psychiatric/Behavioral: Negative for altered mental status, depression and suicidal ideas.  Allergic/Immunologic: Negative for HIV exposure and persistent infections.    EKGs/Labs/Other Studies  Reviewed:    The following studies were reviewed today:   EKG:  The ekg ordered today demonstrates   Recent Labs: 02/28/2023: B Natriuretic Peptide 19.3 06/05/2023: Hemoglobin 12.4; Platelets 397.0 09/06/2023: ALT 27; BUN 19; Magnesium 2.3; Potassium 4.4; Sodium 142 11/09/2023: Creatinine, Ser 1.10  Recent Lipid Panel    Component Value Date/Time   CHOL 217 (H) 09/02/2021 1131   TRIG 204.0 (H) 09/02/2021 1131   TRIG 136 11/22/2006 0728   HDL 52.80 09/02/2021 1131   CHOLHDL 4 09/02/2021 1131   VLDL 40.8 (H) 09/02/2021 1131   LDLCALC 85 07/16/2020 1300   LDLDIRECT 120.0 09/02/2021 1131    Physical Exam:    VS:  BP (!) 160/84 (BP Location: Right Arm, Patient Position: Sitting, Cuff Size: Normal)   Pulse 84   Ht 4\' 10"  (1.473 m)   Wt 145 lb 12.8 oz (66.1 kg)   LMP 11/24/2016   SpO2 96%   BMI 30.47 kg/m     Wt Readings from Last 3 Encounters:  12/08/23 145 lb 12.8 oz (66.1 kg)  11/14/23 143 lb (64.9 kg)  09/20/23 141 lb (64 kg)     GEN: Well nourished, well developed in no acute distress HEENT: Normal NECK: No JVD; No carotid bruits LYMPHATICS: No lymphadenopathy CARDIAC: S1S2 noted,RRR, no murmurs, rubs, gallops RESPIRATORY:  Clear to auscultation without rales, wheezing or rhonchi  ABDOMEN: Soft, non-tender, non-distended, +bowel sounds, no guarding. EXTREMITIES: No edema, No cyanosis, no clubbing MUSCULOSKELETAL:  No deformity  SKIN: Warm and dry NEUROLOGIC:  Alert and oriented x 3, non-focal PSYCHIATRIC:  Normal affect, good insight  ASSESSMENT:    1. Hyperlipidemia, unspecified hyperlipidemia type   2. Primary hypertension    3. Prediabetes   4. Mild CAD     PLAN:    CAD - no anginal symptoms   Hypertension Elevated blood pressure today likely due to anxiety. Currently on Amlodipine 2.5mg  and Benicar 20mg . -Retake blood pressure today. -Continue current medications.  Hyperlipidemia Currently on Lipitor. Mild soft plaque noted in circumflex artery on recent CT. LDL last checked was 85. -Continue Lipitor. -Check lipid panel today. -Goal to reduce LDL to less than 70, ideally less than 55.  Prediabetes Recent HbA1c was 5.7, trending towards prediabetes. -Check HbA1c today. -Advise patient to reduce intake of refined carbohydrates and increase intake of whole grains, fruits, vegetables, and lean proteins.  Preoperative clearance - she does not need any further testing - she can proceed with her noncardiac surgery.  She still needs to wear her zio monitor for 14 days.   General Health Maintenance / Followup Plans -Return in 1 year for routine follow-up.  The patient is in agreement with the above plan. The patient left the office in stable condition.  The patient will follow up in   Medication Adjustments/Labs and Tests Ordered: Current medicines are reviewed at length with the patient today.  Concerns regarding medicines are outlined above.  Orders Placed This Encounter  Procedures   Lipid panel   No orders of the defined types were placed in this encounter.   Patient Instructions  Medication Instructions:  Your physician recommends that you continue on your current medications as directed. Please refer to the Current Medication list given to you today.  *If you need a refill on your cardiac medications before your next appointment, please call your pharmacy*   Lab Work: Lipids If you have labs (blood work) drawn today and your tests are completely normal, you will receive your results only  by: MyChart Message (if you have MyChart) OR A paper copy in the mail If you have any lab test  that is abnormal or we need to change your treatment, we will call you to review the results.   Follow-Up: At [facility], you and your health needs are our priority.  As part of our continuing mission to provide you with exceptional heart care, we have created designated Provider Care Teams.  These Care Teams include your primary Cardiologist (physician) and Advanced Practice Providers (APPs -  Physician Assistants and Nurse Practitioners) who all work together to provide you with the care you need, when you need it.  Your next appointment:   1 year(s)  Provider:   Thomasene Ripple, DO     Other Instructions:     Adopting a Healthy Lifestyle.  Know what a healthy weight is for you (roughly BMI <25) and aim to maintain this   Aim for 7+ servings of fruits and vegetables daily   65-80+ fluid ounces of water or unsweet tea for healthy kidneys   Limit to max 1 drink of alcohol per day; avoid smoking/tobacco   Limit animal fats in diet for cholesterol and heart health - choose grass fed whenever available   Avoid highly processed foods, and foods high in saturated/trans fats   Aim for low stress - take time to unwind and care for your mental health   Aim for 150 min of moderate intensity exercise weekly for heart health, and weights twice weekly for bone health   Aim for 7-9 hours of sleep daily   When it comes to diets, agreement about the perfect plan isnt easy to find, even among the experts. Experts at the [facility] of [employer] developed an idea known as the Healthy Eating Plate. Just imagine a plate divided into logical, healthy portions.   The emphasis is on diet quality:   Load up on vegetables and fruits - one-half of your plate: Aim for color and variety, and remember that potatoes dont count.   Go for whole grains - one-quarter of your plate: Whole wheat, barley, wheat berries, quinoa, oats, brown rice, and foods made with them. If you want pasta, go  with whole wheat pasta.   Protein power - one-quarter of your plate: Fish, chicken, beans, and nuts are all healthy, versatile protein sources. Limit red meat.   The diet, however, does go beyond the plate, offering a few other suggestions.   Use healthy plant oils, such as olive, canola, soy, corn, sunflower and peanut. Check the labels, and avoid partially hydrogenated oil, which have unhealthy trans fats.   If youre thirsty, drink water. Coffee and tea are good in moderation, but skip sugary drinks and limit milk and dairy products to one or two daily servings.   The type of carbohydrate in the diet is more important than the amount. Some sources of carbohydrates, such as vegetables, fruits, whole grains, and beans-are healthier than others.   Finally, stay active  Signed, Thomasene Ripple, DO  12/08/2023 8:51 PM    [facility]

## 2023-12-08 NOTE — Patient Instructions (Signed)
Medication Instructions:  Your physician recommends that you continue on your current medications as directed. Please refer to the Current Medication list given to you today.  *If you need a refill on your cardiac medications before your next appointment, please call your pharmacy*   Lab Work: Lipids If you have labs (blood work) drawn today and your tests are completely normal, you will receive your results only by: MyChart Message (if you have MyChart) OR A paper copy in the mail If you have any lab test that is abnormal or we need to change your treatment, we will call you to review the results.   Follow-Up: At [facility], you and your health needs are our priority.  As part of our continuing mission to provide you with exceptional heart care, we have created designated Provider Care Teams.  These Care Teams include your primary Cardiologist (physician) and Advanced Practice Providers (APPs -  Physician Assistants and Nurse Practitioners) who all work together to provide you with the care you need, when you need it.  Your next appointment:   1 year(s)  Provider:   Thomasene Ripple, DO     Other Instructions:

## 2023-12-08 NOTE — Telephone Encounter (Signed)
Pt has heart monitor and said she doesn't know when she is to put it on. Please advise

## 2023-12-08 NOTE — Telephone Encounter (Signed)
Patient identification verified by 2 forms. Marilynn Rail, RN   Called and spoke to patient  Patient states:   -she has had Heart Monitor for a while, since November   -she does not know when she should wear heart monitor   -She completed CT 11/09/23, results reviewed at visit today   -was advised to follow up in 1 year  Informed patient:   -Per visit note: "-Place a 7-day heart monitor after the CT scan to evaluate for arrhythmias or other causes of dizziness.Follow-up in 12 weeks to discuss results and further management."   -message sent to provider for input/advisement  Patient verbalized understanding, no questions at this time

## 2023-12-09 LAB — LIPID PANEL
Chol/HDL Ratio: 5 {ratio} — ABNORMAL HIGH (ref 0.0–4.4)
Cholesterol, Total: 207 mg/dL — ABNORMAL HIGH (ref 100–199)
HDL: 41 mg/dL (ref >39)
LDL Chol Calc (NIH): 124 mg/dL — ABNORMAL HIGH (ref 0–99)
Triglycerides: 237 mg/dL — ABNORMAL HIGH (ref 0–149)
VLDL Cholesterol Cal: 42 mg/dL — ABNORMAL HIGH (ref 5–40)

## 2023-12-10 ENCOUNTER — Encounter: Payer: Self-pay | Admitting: Cardiology

## 2023-12-11 ENCOUNTER — Telehealth: Payer: Self-pay | Admitting: Internal Medicine

## 2023-12-11 DIAGNOSIS — R42 Dizziness and giddiness: Secondary | ICD-10-CM

## 2023-12-11 NOTE — Telephone Encounter (Signed)
PT was ordered by Dr. Rhea Belton to get a cardiac clearance for her hiatal hernia. She wants to know what are her next steps now. Please advise.

## 2023-12-11 NOTE — Telephone Encounter (Signed)
Left message for patient to call back. Dr Andrey Campanile ([facility]) is the one that requested cardiac clearance prior to moving forward with any HH repair (see 03/16/24 general surgery note). If she has gotten cardiac clearance, she needs to reach back out to Dr Tawana Scale office to make them aware so they can review clearance.

## 2023-12-11 NOTE — Telephone Encounter (Signed)
Tobb, Kardie, DO    She needs to wear the monitor for 14 days. Yes fu is 1 year. I will call her with the results.

## 2023-12-11 NOTE — Telephone Encounter (Signed)
Spoke to patient to advise she should contact Dr Tawana Scale office following cardiac clearance. She asked about urgency of surgery and I advised Dr Andrey Campanile indicated 03/2023 that surgery wasn't emergent but needed to be done over the next couple of months, therefore, at this point, she should move forward with surgery as soon as they can get her scheduled. She verbalizes understanding.

## 2023-12-11 NOTE — Telephone Encounter (Signed)
Patient identification verified by 2 forms. Marilynn Rail, RN    Called and spoke to patient  Relayed provider message below  Patient verbalized understanding, no questions at this time

## 2023-12-12 ENCOUNTER — Encounter: Payer: Self-pay | Admitting: Internal Medicine

## 2023-12-12 ENCOUNTER — Ambulatory Visit: Payer: Medicare PPO | Admitting: Internal Medicine

## 2023-12-12 ENCOUNTER — Other Ambulatory Visit: Payer: Self-pay

## 2023-12-12 VITALS — BP 110/60 | HR 85 | Temp 98.2°F | Ht <= 58 in | Wt 143.0 lb

## 2023-12-12 DIAGNOSIS — N1831 Chronic kidney disease, stage 3a: Secondary | ICD-10-CM

## 2023-12-12 DIAGNOSIS — Z79899 Other long term (current) drug therapy: Secondary | ICD-10-CM

## 2023-12-12 DIAGNOSIS — M545 Low back pain, unspecified: Secondary | ICD-10-CM

## 2023-12-12 DIAGNOSIS — M79605 Pain in left leg: Secondary | ICD-10-CM | POA: Diagnosis not present

## 2023-12-12 DIAGNOSIS — E785 Hyperlipidemia, unspecified: Secondary | ICD-10-CM

## 2023-12-12 DIAGNOSIS — K2101 Gastro-esophageal reflux disease with esophagitis, with bleeding: Secondary | ICD-10-CM | POA: Diagnosis not present

## 2023-12-12 MED ORDER — ATORVASTATIN CALCIUM 40 MG PO TABS: 40.0000 mg | ORAL_TABLET | Freq: Every day | ORAL | 1 refills | Status: DC

## 2023-12-12 MED ORDER — OXYCODONE HCL 10 MG PO TABS: 10.0000 mg | ORAL_TABLET | Freq: Three times a day (TID) | ORAL | 0 refills | Status: DC | PRN

## 2023-12-12 MED ORDER — MORPHINE SULFATE ER 30 MG PO TBCR: 30.0000 mg | EXTENDED_RELEASE_TABLET | Freq: Two times a day (BID) | ORAL | 0 refills | Status: DC

## 2023-12-12 NOTE — Assessment & Plan Note (Signed)
On Protonix bid

## 2023-12-12 NOTE — Assessment & Plan Note (Signed)
Hydrate well

## 2023-12-12 NOTE — Assessment & Plan Note (Signed)
Chronic pain in the back, hips  On Oxycodone and MS Contin   Potential benefits of a long term opioids use as well as potential risks (i.e. addiction risk, apnea etc) and complications (i.e. Somnolence, constipation and others) were explained to the patient and were aknowledged. Samantha [name] is giving Samantha [name] her pain meds RTC 1 mo

## 2023-12-12 NOTE — Progress Notes (Signed)
Subjective:  Patient ID: Samantha [name], female    DOB: 30-Nov-1965  Age: 58 y.o. MRN: [id number]  CC: Medical Management of Chronic Issues (4 WEEK F/U)   HPI Samantha [name] presents for chronic pain  Outpatient Medications Prior to Visit  Medication Sig Dispense Refill   amLODipine (NORVASC) 2.5 MG tablet Take 1 tablet (2.5 mg total) by mouth daily. 90 tablet 3   atorvastatin (LIPITOR) 40 MG tablet Take 1 tablet (40 mg total) by mouth daily. 90 tablet 1   busPIRone (BUSPAR) 10 MG tablet Take 1 tablet (10 mg total) by mouth 3 (three) times daily. 270 tablet 3   Calcium Carb-Cholecalciferol (CALCIUM+D3 PO) Take 1 tablet by mouth 2 (two) times daily with a meal.     Docusate Calcium (STOOL SOFTENER PO) Take 1 capsule by mouth 2 (two) times daily as needed (for constipation).     donepezil (ARICEPT) 10 MG tablet TAKE 1 TABLET AT BEDTIME 90 tablet 3   DULoxetine (CYMBALTA) 60 MG capsule TAKE 1 CAPSULE EVERY DAY 90 capsule 3   famotidine (PEPCID) 40 MG tablet Take 1 tablet (40 mg total) by mouth daily. 90 tablet 3   famotidine (PEPCID) 40 MG tablet TAKE 1 TABLET EVERY DAY 90 tablet 3   Ferric Maltol (ACCRUFER) 30 MG CAPS Take 1 capsule (30 mg total) by mouth 2 (two) times daily. 180 capsule 2   gabapentin (NEURONTIN) 600 MG tablet Take 1 tablet 3 times a day 270 tablet 1   lactulose (CHRONULAC) 10 GM/15ML solution Take 30-45 mLs (20-30 g total) by mouth 2 (two) times daily as needed for moderate constipation or severe constipation. 946 mL 5   lamoTRIgine (LAMICTAL) 100 MG tablet TAKE 1 TABLET EVERY MORNING AND 1 AND 1/2 TABLETS AT NIGHT 225 tablet 3   lidocaine (LIDODERM) 5 % Place 1-2 patches onto the skin daily. Remove & Discard patch within 12 hours or as directed by MD 180 patch 1   linaclotide (LINZESS) 290 MCG CAPS capsule Take 1 capsule (290 mcg total) by mouth daily before breakfast. 90 capsule 3   memantine (NAMENDA) 5 MG tablet TAKE 1 TABLET TWICE DAILY 180 tablet 3   olmesartan  (BENICAR) 20 MG tablet Take 1 tablet (20 mg total) by mouth daily. 90 tablet 3   ondansetron (ZOFRAN) 4 MG tablet TAKE 1 TABLET EVERY 8 HOURS AS NEEDED FOR NAUSEA OR VOMITING. 90 tablet 3   pantoprazole (PROTONIX) 40 MG tablet Take 1 tablet (40 mg total) by mouth 2 (two) times daily before a meal. 180 tablet 3   Probiotic Product (ALIGN) 4 MG CAPS Take 1 capsule (4 mg total) by mouth daily. (Patient taking differently: Take 4 mg by mouth daily as needed (for stomach or constipation issues).) 90 capsule 1   promethazine (PHENERGAN) 12.5 MG tablet Take 1 tablet (12.5 mg total) by mouth every 6 (six) hours as needed for nausea or vomiting. 40 tablet 0   morphine (MS CONTIN) 30 MG 12 hr tablet Take 1 tablet (30 mg total) by mouth every 12 (twelve) hours. 60 tablet 0   Oxycodone HCl 10 MG TABS Take 1 tablet (10 mg total) by mouth 3 (three) times daily as needed. 90 tablet 0   metoprolol tartrate (LOPRESSOR) 100 MG tablet Take 2 hours prior to CT (Patient not taking: Reported on 12/08/2023) 1 tablet 0   No facility-administered medications prior to visit.    ROS: Review of Systems  Constitutional:  Negative for activity change, appetite  change, chills, fatigue and unexpected weight change.  HENT:  Negative for congestion, mouth sores and sinus pressure.   Eyes:  Negative for visual disturbance.  Respiratory:  Negative for cough and chest tightness.   Gastrointestinal:  Negative for abdominal pain and nausea.  Genitourinary:  Negative for difficulty urinating, frequency and vaginal pain.  Musculoskeletal:  Positive for arthralgias, back pain and gait problem.  Skin:  Negative for pallor and rash.  Neurological:  Negative for dizziness, tremors, weakness, numbness and headaches.  Psychiatric/Behavioral:  Negative for confusion, sleep disturbance and suicidal ideas.     Objective:  BP 110/60 (BP Location: Right Arm, Patient Position: Sitting, Cuff Size: Normal)   Pulse 85   Temp 98.2 F (36.8 C)  (Oral)   Ht 4\' 10"  (1.473 m)   Wt 143 lb (64.9 kg)   LMP 11/24/2016   SpO2 94%   BMI 29.89 kg/m   BP Readings from Last 3 Encounters:  12/12/23 110/60  12/08/23 (!) 160/84  11/14/23 110/70    Wt Readings from Last 3 Encounters:  12/12/23 143 lb (64.9 kg)  12/08/23 145 lb 12.8 oz (66.1 kg)  11/14/23 143 lb (64.9 kg)    Physical Exam Constitutional:      General: She is not in acute distress.    Appearance: She is well-developed.  HENT:     Head: Normocephalic.     Right Ear: External ear normal.     Left Ear: External ear normal.     Nose: Nose normal.  Eyes:     General:        Right eye: No discharge.        Left eye: No discharge.     Conjunctiva/sclera: Conjunctivae normal.     Pupils: Pupils are equal, round, and reactive to light.  Neck:     Thyroid: No thyromegaly.     Vascular: No JVD.     Trachea: No tracheal deviation.  Cardiovascular:     Rate and Rhythm: Normal rate and regular rhythm.     Heart sounds: Normal heart sounds.  Pulmonary:     Effort: No respiratory distress.     Breath sounds: No stridor. No wheezing.  Abdominal:     General: Bowel sounds are normal. There is no distension.     Palpations: Abdomen is soft. There is no mass.     Tenderness: There is no abdominal tenderness. There is no guarding or rebound.  Musculoskeletal:        General: No tenderness.     Cervical back: Normal range of motion and neck supple. No rigidity.  Lymphadenopathy:     Cervical: No cervical adenopathy.  Skin:    Findings: No erythema or rash.  Neurological:     Mental Status: Mental status is at baseline.     Cranial Nerves: No cranial nerve deficit.     Motor: No abnormal muscle tone.     Coordination: Coordination normal.     Deep Tendon Reflexes: Reflexes normal.  Psychiatric:        Behavior: Behavior normal.        Thought Content: Thought content normal.        Judgment: Judgment normal.   Zio monitor is on  Lab Results  Component Value  Date   WBC 6.2 06/05/2023   HGB 12.4 06/05/2023   HCT 37.6 06/05/2023   PLT 397.0 06/05/2023   GLUCOSE 88 09/06/2023   CHOL 207 (H) 12/08/2023   TRIG 237 (H) 12/08/2023  HDL 41 12/08/2023   LDLDIRECT 120.0 09/02/2021   LDLCALC 124 (H) 12/08/2023   ALT 27 09/06/2023   AST 32 09/06/2023   NA 142 09/06/2023   K 4.4 09/06/2023   CL 104 09/06/2023   CREATININE 1.10 (H) 11/09/2023   BUN 19 09/06/2023   CO2 27 09/06/2023   TSH 3.09 09/02/2021   INR 1.1 02/28/2023   HGBA1C 5.7 01/12/2022    CT CORONARY MORPH W/CTA COR W/SCORE W/CA W/CM &/OR WO/CM Addendum Date: 11/23/2023 ADDENDUM REPORT: 11/23/2023 19:39 EXAM: OVER-READ INTERPRETATION  CT CHEST The following report is an over-read performed by radiologist Dr. Alcide Clever of [facility] on 11/23/2023. This over-read does not include interpretation of cardiac or coronary anatomy or pathology. The coronary calcium score/coronary CTA interpretation by the cardiologist is attached. COMPARISON:  06/30/23 FINDINGS: Cardiovascular: There are no significant extracardiac vascular findings. Mediastinum/Nodes: There are no enlarged lymph nodes within the visualized mediastinum. Lungs/Pleura: There is no pleural effusion. The visualized lungs appear clear. Upper abdomen: Moderate-sized hiatal hernia is noted. Musculoskeletal/Chest wall: No chest wall mass or suspicious osseous findings within the visualized chest. IMPRESSION: Moderate-sized hiatal hernia. Electronically Signed   By: Alcide Clever M.D.   On: 11/23/2023 19:39   Result Date: 11/23/2023 CLINICAL DATA:  34F with abnormal EKG EXAM: Cardiac/Coronary CTA TECHNIQUE: The patient was scanned on a [employer]. FINDINGS: A 100 kV prospective scan was triggered in the descending thoracic aorta at 111 HU's. Axial non-contrast 3 mm slices were carried out through the heart. The data set was analyzed on a dedicated work station and scored using the Agatson method. Gantry rotation speed was  250 msecs and collimation was .6 mm. No beta blockade and 0.8 mg of sl NTG was given. The 3D data set was reconstructed in 5% intervals of the 35-75% of the R-R cycle. Phases were analyzed on a dedicated work station using MPR, MIP and VRT modes. The patient received 100 cc of contrast. Coronary Arteries:  Normal coronary origin.  Right dominance. RCA is a large dominant artery that gives rise to PDA and PLA. There is no plaque. Left main is a large artery that gives rise to LAD and LCX arteries. LAD is a large vessel that has no plaque. LCX is a non-dominant artery that gives rise to one large OM1 branch. Noncalcified plaque in mid LCX causes mild (25-49%) stenosis Other findings: Left Ventricle: Normal size Left Atrium: Normal size Pulmonary Veins: Normal configuration Right Ventricle: Normal size Right Atrium: Normal size Cardiac valves: No calcifications Thoracic aorta: Normal size Pulmonary Arteries: Normal size Systemic Veins: Normal drainage Pericardium: Normal thickness IMPRESSION: 1.  Coronary calcium score of 0. 2. Total plaque volume 38mm3 which is 30th percentile for age and sex-matched controls (calcified plaque 36mm3; noncalcified plaque 76mm3). TPV is mild 3.  Normal coronary origin with right dominance. 4.  Nonobstructive CAD 5.  Noncalcified plaque in mid LCX causes mild (25-49%) stenosis CAD-RADS 2. Mild non-obstructive CAD (25-49%). Consider non-atherosclerotic causes of chest pain. Consider preventive therapy and risk factor modification. Electronically Signed: By: Epifanio Lesches M.D. On: 11/10/2023 14:34    Assessment & Plan:   Problem List Items Addressed This Visit     GERD (gastroesophageal reflux disease)   On Protonix bid      Low back pain radiating to left lower extremity - Primary   Chronic pain in the back, hips  On Oxycodone and MS Contin   Potential benefits of a long term opioids use  as well as potential risks (i.e. addiction risk, apnea etc) and complications (i.e.  Somnolence, constipation and others) were explained to the patient and were aknowledged. [name] is giving [name] her pain meds RTC 1 mo      Relevant Medications   morphine (MS CONTIN) 30 MG 12 hr tablet   Oxycodone HCl 10 MG TABS   CKD (chronic kidney disease), stage III (HCC)   Hydrate well         Meds ordered this encounter  Medications   morphine (MS CONTIN) 30 MG 12 hr tablet    Sig: Take 1 tablet (30 mg total) by mouth every 12 (twelve) hours.    Dispense:  60 tablet    Refill:  0    Please fill on or after 12/21/23   Oxycodone HCl 10 MG TABS    Sig: Take 1 tablet (10 mg total) by mouth 3 (three) times daily as needed.    Dispense:  90 tablet    Refill:  0    Please fill on or after 12/21/23      Follow-up: Return in about 4 weeks (around 01/09/2024) for a follow-up visit.  Sonda Primes, MD

## 2023-12-26 DIAGNOSIS — R42 Dizziness and giddiness: Secondary | ICD-10-CM | POA: Diagnosis not present

## 2024-01-03 ENCOUNTER — Other Ambulatory Visit: Payer: Self-pay | Admitting: Internal Medicine

## 2024-01-08 DIAGNOSIS — M1711 Unilateral primary osteoarthritis, right knee: Secondary | ICD-10-CM | POA: Diagnosis not present

## 2024-01-09 ENCOUNTER — Other Ambulatory Visit: Payer: Self-pay

## 2024-01-09 MED ORDER — ATORVASTATIN CALCIUM 40 MG PO TABS: 40.0000 mg | ORAL_TABLET | Freq: Every day | ORAL | 3 refills | Status: AC

## 2024-01-10 ENCOUNTER — Encounter: Payer: Self-pay | Admitting: Internal Medicine

## 2024-01-10 ENCOUNTER — Ambulatory Visit: Payer: Medicare PPO | Admitting: Internal Medicine

## 2024-01-10 ENCOUNTER — Ambulatory Visit (INDEPENDENT_AMBULATORY_CARE_PROVIDER_SITE_OTHER): Payer: Medicare PPO | Admitting: Internal Medicine

## 2024-01-10 VITALS — BP 126/82 | HR 77 | Temp 98.3°F | Ht <= 58 in | Wt 140.0 lb

## 2024-01-10 DIAGNOSIS — K5901 Slow transit constipation: Secondary | ICD-10-CM

## 2024-01-10 DIAGNOSIS — R413 Other amnesia: Secondary | ICD-10-CM

## 2024-01-10 DIAGNOSIS — G8929 Other chronic pain: Secondary | ICD-10-CM

## 2024-01-10 DIAGNOSIS — Z0000 Encounter for general adult medical examination without abnormal findings: Secondary | ICD-10-CM

## 2024-01-10 MED ORDER — MORPHINE SULFATE ER 30 MG PO TBCR: 30.0000 mg | EXTENDED_RELEASE_TABLET | Freq: Two times a day (BID) | ORAL | 0 refills | Status: DC

## 2024-01-10 MED ORDER — OXYCODONE HCL 10 MG PO TABS: 10.0000 mg | ORAL_TABLET | Freq: Three times a day (TID) | ORAL | 0 refills | Status: DC | PRN

## 2024-01-10 NOTE — Assessment & Plan Note (Addendum)
 Cont on Linzess daily: form filled out for pt assistance [place] 2024, next due in 2034 Dr Rhea Belton

## 2024-01-10 NOTE — Progress Notes (Signed)
 Subjective:  Patient ID: Samantha [name], female    DOB: 13-Jul-1966  Age: 58 y.o. MRN: [id number]  CC: Medical Management of Chronic Issues (4 WEEK F/U)   HPI RYEN HEITMEYER presents for chronic pain, constipation, GERD  Outpatient Medications Prior to Visit  Medication Sig Dispense Refill   amLODipine (NORVASC) 2.5 MG tablet Take 1 tablet (2.5 mg total) by mouth daily. 90 tablet 3   atorvastatin (LIPITOR) 40 MG tablet Take 1 tablet (40 mg total) by mouth daily. 90 tablet 3   busPIRone (BUSPAR) 10 MG tablet Take 1 tablet (10 mg total) by mouth 3 (three) times daily. 270 tablet 3   Calcium Carb-Cholecalciferol (CALCIUM+D3 PO) Take 1 tablet by mouth 2 (two) times daily with a meal.     Docusate Calcium (STOOL SOFTENER PO) Take 1 capsule by mouth 2 (two) times daily as needed (for constipation).     donepezil (ARICEPT) 10 MG tablet TAKE 1 TABLET AT BEDTIME 90 tablet 3   DULoxetine (CYMBALTA) 60 MG capsule TAKE 1 CAPSULE EVERY DAY 90 capsule 3   famotidine (PEPCID) 40 MG tablet Take 1 tablet (40 mg total) by mouth daily. 90 tablet 3   famotidine (PEPCID) 40 MG tablet TAKE 1 TABLET EVERY DAY 90 tablet 3   Ferric Maltol (ACCRUFER) 30 MG CAPS Take 1 capsule (30 mg total) by mouth 2 (two) times daily. 180 capsule 2   gabapentin (NEURONTIN) 600 MG tablet TAKE 1 TABLET THREE TIMES DAILY 270 tablet 3   lactulose (CHRONULAC) 10 GM/15ML solution Take 30-45 mLs (20-30 g total) by mouth 2 (two) times daily as needed for moderate constipation or severe constipation. 946 mL 5   lamoTRIgine (LAMICTAL) 100 MG tablet TAKE 1 TABLET EVERY MORNING AND 1 AND 1/2 TABLETS AT NIGHT 225 tablet 3   lidocaine (LIDODERM) 5 % Place 1-2 patches onto the skin daily. Remove & Discard patch within 12 hours or as directed by MD 180 patch 1   linaclotide (LINZESS) 290 MCG CAPS capsule Take 1 capsule (290 mcg total) by mouth daily before breakfast. 90 capsule 3   memantine (NAMENDA) 5 MG tablet TAKE 1 TABLET TWICE DAILY 180  tablet 3   morphine (MS CONTIN) 30 MG 12 hr tablet Take 1 tablet (30 mg total) by mouth every 12 (twelve) hours. 60 tablet 0   olmesartan (BENICAR) 20 MG tablet Take 1 tablet (20 mg total) by mouth daily. 90 tablet 3   ondansetron (ZOFRAN) 4 MG tablet TAKE 1 TABLET EVERY 8 HOURS AS NEEDED FOR NAUSEA OR VOMITING. 90 tablet 3   Oxycodone HCl 10 MG TABS Take 1 tablet (10 mg total) by mouth 3 (three) times daily as needed. 90 tablet 0   pantoprazole (PROTONIX) 40 MG tablet Take 1 tablet (40 mg total) by mouth 2 (two) times daily before a meal. 180 tablet 3   Probiotic Product (ALIGN) 4 MG CAPS Take 1 capsule (4 mg total) by mouth daily. (Patient taking differently: Take 4 mg by mouth daily as needed (for stomach or constipation issues).) 90 capsule 1   promethazine (PHENERGAN) 12.5 MG tablet Take 1 tablet (12.5 mg total) by mouth every 6 (six) hours as needed for nausea or vomiting. 40 tablet 0   No facility-administered medications prior to visit.    ROS: Review of Systems  Constitutional:  Positive for fatigue. Negative for activity change, appetite change, chills and unexpected weight change.  HENT:  Negative for congestion, mouth sores and sinus pressure.  Eyes:  Negative for visual disturbance.  Respiratory:  Negative for cough and chest tightness.   Gastrointestinal:  Negative for abdominal pain and nausea.  Genitourinary:  Negative for difficulty urinating, frequency and vaginal pain.  Musculoskeletal:  Positive for arthralgias, back pain and gait problem.  Skin:  Negative for pallor and rash.  Neurological:  Negative for dizziness, tremors, weakness, numbness and headaches.  Psychiatric/Behavioral:  Positive for decreased concentration. Negative for confusion, sleep disturbance and suicidal ideas. The patient is nervous/anxious.     Objective:  BP 126/82   Pulse 77   Temp 98.3 F (36.8 C) (Oral)   Ht 4\' 10"  (1.473 m)   Wt 140 lb (63.5 kg)   LMP 11/24/2016   SpO2 96%   BMI  29.26 kg/m   BP Readings from Last 3 Encounters:  01/10/24 126/82  12/12/23 110/60  12/08/23 (!) 160/84    Wt Readings from Last 3 Encounters:  01/10/24 140 lb (63.5 kg)  12/12/23 143 lb (64.9 kg)  12/08/23 145 lb 12.8 oz (66.1 kg)    Physical Exam Constitutional:      General: She is not in acute distress.    Appearance: She is well-developed. She is obese.  HENT:     Head: Normocephalic.     Right Ear: External ear normal.     Left Ear: External ear normal.     Nose: Nose normal.  Eyes:     General:        Right eye: No discharge.        Left eye: No discharge.     Conjunctiva/sclera: Conjunctivae normal.     Pupils: Pupils are equal, round, and reactive to light.  Neck:     Thyroid: No thyromegaly.     Vascular: No JVD.     Trachea: No tracheal deviation.  Cardiovascular:     Rate and Rhythm: Normal rate and regular rhythm.     Heart sounds: Normal heart sounds.  Pulmonary:     Effort: No respiratory distress.     Breath sounds: No stridor. No wheezing.  Abdominal:     General: Bowel sounds are normal. There is no distension.     Palpations: Abdomen is soft. There is no mass.     Tenderness: There is no abdominal tenderness. There is no guarding or rebound.  Musculoskeletal:        General: No tenderness.     Cervical back: Normal range of motion and neck supple. No rigidity.     Right lower leg: No edema.     Left lower leg: No edema.  Lymphadenopathy:     Cervical: No cervical adenopathy.  Skin:    Findings: No erythema or rash.  Neurological:     Mental Status: Mental status is at baseline.     Cranial Nerves: No cranial nerve deficit.     Motor: No abnormal muscle tone.     Coordination: Coordination normal.     Gait: Gait abnormal.     Deep Tendon Reflexes: Reflexes normal.  Psychiatric:        Behavior: Behavior normal.        Thought Content: Thought content normal.        Judgment: Judgment normal.   Antalgic gait LS spine - stiff  Lab  Results  Component Value Date   WBC 6.2 06/05/2023   HGB 12.4 06/05/2023   HCT 37.6 06/05/2023   PLT 397.0 06/05/2023   GLUCOSE 88 09/06/2023   CHOL 207 (H)  12/08/2023   TRIG 237 (H) 12/08/2023   HDL 41 12/08/2023   LDLDIRECT 120.0 09/02/2021   LDLCALC 124 (H) 12/08/2023   ALT 27 09/06/2023   AST 32 09/06/2023   NA 142 09/06/2023   K 4.4 09/06/2023   CL 104 09/06/2023   CREATININE 1.10 (H) 11/09/2023   BUN 19 09/06/2023   CO2 27 09/06/2023   TSH 3.09 09/02/2021   INR 1.1 02/28/2023   HGBA1C 5.7 01/12/2022    CT CORONARY MORPH W/CTA COR W/SCORE W/CA W/CM &/OR WO/CM Addendum Date: 11/23/2023 ADDENDUM REPORT: 11/23/2023 19:39 EXAM: OVER-READ INTERPRETATION  CT CHEST The following report is an over-read performed by radiologist Dr. Alcide Clever of [facility] on 11/23/2023. This over-read does not include interpretation of cardiac or coronary anatomy or pathology. The coronary calcium score/coronary CTA interpretation by the cardiologist is attached. COMPARISON:  06/30/23 FINDINGS: Cardiovascular: There are no significant extracardiac vascular findings. Mediastinum/Nodes: There are no enlarged lymph nodes within the visualized mediastinum. Lungs/Pleura: There is no pleural effusion. The visualized lungs appear clear. Upper abdomen: Moderate-sized hiatal hernia is noted. Musculoskeletal/Chest wall: No chest wall mass or suspicious osseous findings within the visualized chest. IMPRESSION: Moderate-sized hiatal hernia. Electronically Signed   By: Alcide Clever M.D.   On: 11/23/2023 19:39   Result Date: 11/23/2023 CLINICAL DATA:  68F with abnormal EKG EXAM: Cardiac/Coronary CTA TECHNIQUE: The patient was scanned on a [employer]. FINDINGS: A 100 kV prospective scan was triggered in the descending thoracic aorta at 111 HU's. Axial non-contrast 3 mm slices were carried out through the heart. The data set was analyzed on a dedicated work station and scored using the Agatson method.  Gantry rotation speed was 250 msecs and collimation was .6 mm. No beta blockade and 0.8 mg of sl NTG was given. The 3D data set was reconstructed in 5% intervals of the 35-75% of the R-R cycle. Phases were analyzed on a dedicated work station using MPR, MIP and VRT modes. The patient received 100 cc of contrast. Coronary Arteries:  Normal coronary origin.  Right dominance. RCA is a large dominant artery that gives rise to PDA and PLA. There is no plaque. Left main is a large artery that gives rise to LAD and LCX arteries. LAD is a large vessel that has no plaque. LCX is a non-dominant artery that gives rise to one large OM1 branch. Noncalcified plaque in mid LCX causes mild (25-49%) stenosis Other findings: Left Ventricle: Normal size Left Atrium: Normal size Pulmonary Veins: Normal configuration Right Ventricle: Normal size Right Atrium: Normal size Cardiac valves: No calcifications Thoracic aorta: Normal size Pulmonary Arteries: Normal size Systemic Veins: Normal drainage Pericardium: Normal thickness IMPRESSION: 1.  Coronary calcium score of 0. 2. Total plaque volume 11mm3 which is 30th percentile for age and sex-matched controls (calcified plaque 19mm3; noncalcified plaque 37mm3). TPV is mild 3.  Normal coronary origin with right dominance. 4.  Nonobstructive CAD 5.  Noncalcified plaque in mid LCX causes mild (25-49%) stenosis CAD-RADS 2. Mild non-obstructive CAD (25-49%). Consider non-atherosclerotic causes of chest pain. Consider preventive therapy and risk factor modification. Electronically Signed: By: Epifanio Lesches M.D. On: 11/10/2023 14:34    Assessment & Plan:   Problem List Items Addressed This Visit     Chronic pain - Primary    Post-thalamic CVA pain syndrome Chronic pain in the back, hips - I took over [name]'s pain management (she was treated at [facility] in the past). On Oxycodone and MS Contin  Potential benefits of a long term opioids use as well as potential risks  (i.e. addiction risk, apnea etc) and complications (i.e. Somnolence, constipation and others) were explained to the patient and were aknowledged.        Well adult exam   Memory loss   Stable Walk more Valerian root for insomnia       Constipation   Cont on Linzess daily: form filled out for pt assistance [facility] 2024, next due in 2034 Dr Rhea Belton         No orders of the defined types were placed in this encounter.     Follow-up: Return in about 4 weeks (around 02/07/2024) for a follow-up visit.  Sonda Primes, MD

## 2024-01-10 NOTE — Assessment & Plan Note (Signed)
Post-thalamic CVA pain syndrome Chronic pain in the back, hips - I took over Samantha [name]'s pain management (she was treated at [facility] in the past). On Oxycodone and MS Contin   Potential benefits of a long term opioids use as well as potential risks (i.e. addiction risk, apnea etc) and complications (i.e. Somnolence, constipation and others) were explained to the patient and were aknowledged.

## 2024-01-10 NOTE — Assessment & Plan Note (Signed)
 Stable Walk more Valerian root for insomnia

## 2024-01-10 NOTE — Addendum Note (Signed)
 Addended by: Tresa Garter on: 01/10/2024 04:27 PM   Modules accepted: Orders

## 2024-01-11 ENCOUNTER — Encounter: Payer: Self-pay | Admitting: Cardiology

## 2024-01-23 ENCOUNTER — Other Ambulatory Visit: Payer: Self-pay | Admitting: Internal Medicine

## 2024-02-02 DIAGNOSIS — M1711 Unilateral primary osteoarthritis, right knee: Secondary | ICD-10-CM | POA: Diagnosis not present

## 2024-02-07 ENCOUNTER — Encounter: Payer: Self-pay | Admitting: Internal Medicine

## 2024-02-07 ENCOUNTER — Ambulatory Visit (INDEPENDENT_AMBULATORY_CARE_PROVIDER_SITE_OTHER): Payer: Medicare PPO | Admitting: Internal Medicine

## 2024-02-07 VITALS — BP 156/90 | HR 69 | Ht <= 58 in | Wt 141.2 lb

## 2024-02-07 DIAGNOSIS — M79605 Pain in left leg: Secondary | ICD-10-CM

## 2024-02-07 DIAGNOSIS — M25561 Pain in right knee: Secondary | ICD-10-CM | POA: Diagnosis not present

## 2024-02-07 DIAGNOSIS — G8929 Other chronic pain: Secondary | ICD-10-CM | POA: Diagnosis not present

## 2024-02-07 DIAGNOSIS — I10 Essential (primary) hypertension: Secondary | ICD-10-CM

## 2024-02-07 DIAGNOSIS — M545 Low back pain, unspecified: Secondary | ICD-10-CM

## 2024-02-07 MED ORDER — MORPHINE SULFATE ER 30 MG PO TBCR
30.0000 mg | EXTENDED_RELEASE_TABLET | Freq: Two times a day (BID) | ORAL | 0 refills | Status: DC
Start: 2024-02-07 — End: 2024-03-05

## 2024-02-07 MED ORDER — LINACLOTIDE 290 MCG PO CAPS: 290.0000 ug | ORAL_CAPSULE | Freq: Every day | ORAL | 3 refills | Status: DC

## 2024-02-07 MED ORDER — OXYCODONE HCL 10 MG PO TABS: 10.0000 mg | ORAL_TABLET | Freq: Three times a day (TID) | ORAL | 0 refills | Status: DC | PRN

## 2024-02-07 NOTE — Assessment & Plan Note (Signed)
-
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
## 2024-02-07 NOTE — Assessment & Plan Note (Signed)
 Dr Bonnita Hollow - spine injection
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
## 2024-02-07 NOTE — Assessment & Plan Note (Signed)
  Dr Ammie Dalton - knee injections

## 2024-02-08 ENCOUNTER — Telehealth: Payer: Self-pay | Admitting: Internal Medicine

## 2024-02-08 NOTE — Telephone Encounter (Signed)
 Copied from CRM [id number]. Topic: Clinical - Prescription Issue >> Feb 08, 2024  1:03 PM Jon Gills C wrote: Reason for CRM: Patient called in regarding prescription  linaclotide (LINZESS) 290 MCG CAPS capsule   Usually gets it 90 day supply, 90 day supply is $200 while 30 day supply is $47  Would like for the prescription to be rewritten and sent back to pharmacy  [facility] - [place], [place] - [address] [place] [address] Phone: [phone] Fax: [phone]

## 2024-02-09 DIAGNOSIS — M1711 Unilateral primary osteoarthritis, right knee: Secondary | ICD-10-CM | POA: Diagnosis not present

## 2024-02-14 ENCOUNTER — Other Ambulatory Visit: Payer: Self-pay

## 2024-02-14 DIAGNOSIS — K5901 Slow transit constipation: Secondary | ICD-10-CM

## 2024-02-14 MED ORDER — LINACLOTIDE 290 MCG PO CAPS: 290.0000 ug | ORAL_CAPSULE | Freq: Every day | ORAL | 11 refills | Status: AC

## 2024-02-14 NOTE — Telephone Encounter (Signed)
 Have since changed to a 30 day supply for this medication.

## 2024-02-15 ENCOUNTER — Other Ambulatory Visit: Payer: Self-pay | Admitting: Internal Medicine

## 2024-02-16 DIAGNOSIS — M1711 Unilateral primary osteoarthritis, right knee: Secondary | ICD-10-CM | POA: Diagnosis not present

## 2024-02-22 ENCOUNTER — Telehealth: Payer: Self-pay | Admitting: Internal Medicine

## 2024-02-22 NOTE — Telephone Encounter (Signed)
 Copied from CRM [id number]. Topic: Referral - Request for Referral >> Feb 22, 2024  3:24 PM Sim Boast F wrote: Did the patient discuss referral with their provider in the last year? Yes (If No - schedule appointment) (If Yes - send message)  Appointment offered? No  Type of order/referral and detailed reason for visit: cpap therapy/sleep study  Preference of office, provider, location: Any other office beside previous referral to [facility], they will no longer take her as a patient since she missed a appointment   If referral order, have you been seen by this specialty before? No (If Yes, this issue or another issue? When? Where?  Can we respond through MyChart? Yes

## 2024-02-24 ENCOUNTER — Other Ambulatory Visit: Payer: Self-pay | Admitting: Internal Medicine

## 2024-03-04 ENCOUNTER — Ambulatory Visit: Admitting: Internal Medicine

## 2024-03-05 ENCOUNTER — Telehealth (INDEPENDENT_AMBULATORY_CARE_PROVIDER_SITE_OTHER): Admitting: Internal Medicine

## 2024-03-05 ENCOUNTER — Encounter: Payer: Self-pay | Admitting: Internal Medicine

## 2024-03-05 DIAGNOSIS — D509 Iron deficiency anemia, unspecified: Secondary | ICD-10-CM | POA: Diagnosis not present

## 2024-03-05 DIAGNOSIS — G8929 Other chronic pain: Secondary | ICD-10-CM | POA: Diagnosis not present

## 2024-03-05 DIAGNOSIS — R5382 Chronic fatigue, unspecified: Secondary | ICD-10-CM | POA: Diagnosis not present

## 2024-03-05 DIAGNOSIS — K2101 Gastro-esophageal reflux disease with esophagitis, with bleeding: Secondary | ICD-10-CM

## 2024-03-05 MED ORDER — MORPHINE SULFATE ER 30 MG PO TBCR: 30.0000 mg | EXTENDED_RELEASE_TABLET | Freq: Two times a day (BID) | ORAL | 0 refills | Status: DC

## 2024-03-05 MED ORDER — OXYCODONE HCL 10 MG PO TABS: 10.0000 mg | ORAL_TABLET | Freq: Three times a day (TID) | ORAL | 0 refills | Status: DC | PRN

## 2024-03-05 NOTE — Assessment & Plan Note (Signed)
Check CBC, iron 

## 2024-03-05 NOTE — Assessment & Plan Note (Signed)
 Samantha [name] needs to exercise more Check CBC, iron 
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
## 2024-03-05 NOTE — Assessment & Plan Note (Signed)
 On Protonix bid
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
## 2024-03-07 ENCOUNTER — Ambulatory Visit: Admitting: Family Medicine

## 2024-03-08 ENCOUNTER — Other Ambulatory Visit: Payer: Self-pay

## 2024-03-08 DIAGNOSIS — G4733 Obstructive sleep apnea (adult) (pediatric): Secondary | ICD-10-CM

## 2024-03-08 NOTE — Telephone Encounter (Signed)
 Referral has been placed, requesting [facility] if possible

## 2024-03-09 ENCOUNTER — Other Ambulatory Visit: Payer: Self-pay | Admitting: Internal Medicine

## 2024-03-12 ENCOUNTER — Ambulatory Visit: Admitting: Family Medicine

## 2024-03-23 ENCOUNTER — Other Ambulatory Visit: Payer: Self-pay | Admitting: Internal Medicine

## 2024-04-04 ENCOUNTER — Encounter: Payer: Self-pay | Admitting: Internal Medicine

## 2024-04-04 ENCOUNTER — Telehealth (INDEPENDENT_AMBULATORY_CARE_PROVIDER_SITE_OTHER): Admitting: Internal Medicine

## 2024-04-04 DIAGNOSIS — I10 Essential (primary) hypertension: Secondary | ICD-10-CM

## 2024-04-04 DIAGNOSIS — G8929 Other chronic pain: Secondary | ICD-10-CM | POA: Diagnosis not present

## 2024-04-04 MED ORDER — MORPHINE SULFATE ER 30 MG PO TBCR: 30.0000 mg | EXTENDED_RELEASE_TABLET | Freq: Two times a day (BID) | ORAL | 0 refills | Status: DC

## 2024-04-04 MED ORDER — OXYCODONE HCL 10 MG PO TABS: 10.0000 mg | ORAL_TABLET | Freq: Three times a day (TID) | ORAL | 0 refills | Status: DC | PRN
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
## 2024-04-04 NOTE — Assessment & Plan Note (Signed)
 RTC 1 mo to check BP NAS diet Walk/bike Patent attorney) more
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
## 2024-04-05 DIAGNOSIS — M1711 Unilateral primary osteoarthritis, right knee: Secondary | ICD-10-CM | POA: Diagnosis not present

## 2024-04-12 ENCOUNTER — Other Ambulatory Visit: Payer: Self-pay | Admitting: Medical Genetics

## 2024-04-18 ENCOUNTER — Encounter (HOSPITAL_BASED_OUTPATIENT_CLINIC_OR_DEPARTMENT_OTHER): Payer: Self-pay | Admitting: Adult Health

## 2024-04-18 ENCOUNTER — Ambulatory Visit (HOSPITAL_BASED_OUTPATIENT_CLINIC_OR_DEPARTMENT_OTHER): Admitting: Adult Health

## 2024-04-20 NOTE — Progress Notes (Addendum)
 HPI female never smoker followed for OSA, complicated by history of migraine, hydrocephalus/VP shunt/  and concern for nocturnal seizures, Hx CVA for which she is followed by Neurology, back pain NPSG 03/08/15 at [facility] Sleep with full seizure montage- AHI 12.1/ hr, desaturation to 91%, no seizure activity   ------------------------------------------------------------------------------------------------   03/15/17- 58 year old female never smoker followed by Dr. Shellia for OSA, complicated by history of migraine, hydrocephalus/VP shunt and concern for nocturnal seizures, Hx CVA for which she is followed by Neurology, back pain CPAP auto 5-15/[facility] FOLLOWS FOR DME IS [facility]  Down load is attached patient wears cpap every night and with napping  Download 93%/4 hours, AHI 0.7/hour. Uses melatonin for sleep along with her Cymbalta  and says this works well. Using CPAP routinely including with naps, taking a nap most afternoons. She is quite satisfied and definitely sleeps better with CPAP. Settled on a nasal mask.  04/22/24- 58 year old female never smoker followed by Dr. Shellia for OSA, complicated by history of migraine, hydrocephalus/VP shunt and concern for nocturnal seizures, Hx CVA for which she is followed by Neurology, back pain, HTN, Daytime somnolence, Depression, Insomnia CPAP auto 5-15/[facility]  17 Download compliance- LOV- [employer]GLENWOOD Ferrari NP 09/20/2019- [facility], CPAP 5-15, good compliance and control at that time. Body weight today-142 lbs Discussed the use of AI scribe software for clinical note transcription with the patient, who gave verbal consent to proceed.  History of Present Illness   Samantha [name] is a 58 year old female with sleep apnea who presents with persistent daytime sleepiness despite CPAP use.  She experiences persistent daytime sleepiness despite regular CPAP use since her sleep apnea diagnosis in 2016. She easily falls asleep during activities such as watching movies, a  problem that predates CPAP therapy. Her CPAP machine is old, and she cannot determine its settings. She snores lightly even with CPAP, and her husband often adjusts her nasal mask. She talks in her sleep and experiences twitching movements but has no history of sleepwalking or violent movements during sleep. She takes Tylenol  PM nightly to aid sleep and consumes one cup of coffee in the morning and some sodas during the day, switching to decaf later. She naps most days for a couple of hours, which sometimes helps her feel better. No ENT surgery. Has dentures.       Assessment and Plan:    Obstructive Sleep Apnea Chronic obstructive sleep apnea with persistent symptoms despite CPAP use. CPAP machine outdated, settings unknown. Long uvula may contribute to obstruction. She preferred in-lab sleep study for accurate assessment. - Order in-lab split night [facility] study [facility]. [facility] insurance authorization for sleep study.  Chronic Pain Chronic pain in back, knee, and hip. Pain management not discussed.  Stroke Stroke in 2014. No current neurological symptoms. V-P shunt in place of NPH  Cameron Lesions Cameron lesions on hiatal hernia, previously cauterized. No current gastrointestinal bleeding.     ROS-see HPI   + = pos Constitutional:    weight loss, night sweats, fevers, chills, fatigue, lassitude. HEENT:    headaches, difficulty swallowing, tooth/dental problems, sore throat,       sneezing, itching, ear ache, nasal congestion, post nasal drip, snoring CV:    chest pain, orthopnea, PND, swelling in lower extremities, anasarca,  dizziness, palpitations Resp:   shortness of breath with exertion or at rest.                productive cough,   non-productive cough, coughing up of blood.              change in color of mucus.  wheezing.   Skin:    rash or lesions. GI:  No-   heartburn,  indigestion, abdominal pain, nausea, vomiting, diarrhea,                 change in bowel habits, loss of appetite GU: dysuria, change in color of urine, no urgency or frequency.   flank pain. MS:   joint pain, stiffness, decreased range of motion, back pain. Neuro-     nothing unusual Psych:  change in mood or affect.  depression or anxiety.   memory loss.  OBJ- Physical Exam       General- Alert, Oriented, Affect-appropriate, Distress- none acute, +short Skin- rash-none, lesions- none, excoriation- none Lymphadenopathy- none Head- atraumatic, + R VP shunt            Eyes- Gross vision intact, PERRLA, conjunctivae and secretions clear            Ears- Hearing, canals-normal            Nose- Clear, no-Septal dev, mucus, polyps, erosion, perforation             Throat- Mallampati III-IV , mucosa clear , drainage- none, tonsils+present Neck- flexible , trachea midline, no stridor , thyroid  nl, carotid no bruit Chest - symmetrical excursion , unlabored           Heart/CV- RRR , no murmur , no gallop  , no rub, nl s1 s2                           - JVD- none , edema- none, stasis changes- none, varices- none           Lung- clear to P&A, wheeze- none, cough- none , dullness-none, rub- none           Chest wall-  Abd-  Br/ Gen/ Rectal- Not done, not indicated Extrem- cyanosis- none, clubbing, none, atrophy- none, strength- nl Neuro- grossly intact to observation

## 2024-04-22 ENCOUNTER — Encounter: Payer: Self-pay | Admitting: Internal Medicine

## 2024-04-22 ENCOUNTER — Ambulatory Visit (INDEPENDENT_AMBULATORY_CARE_PROVIDER_SITE_OTHER): Admitting: Internal Medicine

## 2024-04-22 VITALS — BP 128/78 | HR 65 | Temp 98.2°F | Ht <= 58 in | Wt 142.0 lb

## 2024-04-22 DIAGNOSIS — G4733 Obstructive sleep apnea (adult) (pediatric): Secondary | ICD-10-CM

## 2024-04-22 DIAGNOSIS — G4710 Hypersomnia, unspecified: Secondary | ICD-10-CM | POA: Diagnosis not present

## 2024-04-22 NOTE — Patient Instructions (Signed)
 Order- schedule in-center Split night sleep study    dx OSA, Hypersomnia  Please check with me about 2 weeks after your sleep test for result and recommendations.

## 2024-05-06 ENCOUNTER — Ambulatory Visit: Admitting: Internal Medicine

## 2024-05-06 ENCOUNTER — Telehealth (INDEPENDENT_AMBULATORY_CARE_PROVIDER_SITE_OTHER): Admitting: Internal Medicine

## 2024-05-06 ENCOUNTER — Encounter: Payer: Self-pay | Admitting: Internal Medicine

## 2024-05-06 DIAGNOSIS — M79605 Pain in left leg: Secondary | ICD-10-CM

## 2024-05-06 DIAGNOSIS — G8929 Other chronic pain: Secondary | ICD-10-CM | POA: Diagnosis not present

## 2024-05-06 DIAGNOSIS — N1831 Chronic kidney disease, stage 3a: Secondary | ICD-10-CM | POA: Diagnosis not present

## 2024-05-06 DIAGNOSIS — M545 Low back pain, unspecified: Secondary | ICD-10-CM

## 2024-05-06 DIAGNOSIS — M25561 Pain in right knee: Secondary | ICD-10-CM

## 2024-05-06 MED ORDER — OXYCODONE HCL 10 MG PO TABS: 10.0000 mg | ORAL_TABLET | Freq: Three times a day (TID) | ORAL | 0 refills | Status: DC | PRN

## 2024-05-06 MED ORDER — MORPHINE SULFATE ER 30 MG PO TBCR: 30.0000 mg | EXTENDED_RELEASE_TABLET | Freq: Two times a day (BID) | ORAL | 0 refills | Status: DC

## 2024-05-06 MED ORDER — METHYLPREDNISOLONE 4 MG PO TBPK: ORAL_TABLET | ORAL | 0 refills | Status: DC

## 2024-05-06 NOTE — Progress Notes (Signed)
 Virtual Visit via Video Note  I connected with Samantha [name] on 05/06/24 at  3:00 PM EDT by a video enabled telemedicine application and verified that I am speaking with the correct person using two identifiers.   I discussed the limitations of evaluation and management by telemedicine and the availability of in person appointments. The patient expressed understanding and agreed to proceed.  I was located at our [facility] office. The patient was at home. There was no one else present in the visit.  Chief Complaint  Patient presents with   Medical Management of Chronic Issues     History of Present Illness: C/o R knee pain - worse. In PT. Taking Ibuprofen 600 mg at hs F/u on chronic pain  Review of Systems  Constitutional:  Negative for fever and malaise/fatigue.  Respiratory:  Negative for cough.   Cardiovascular:  Negative for chest pain.  Musculoskeletal:  Positive for back pain, joint pain and neck pain. Negative for falls.  Psychiatric/Behavioral:  Negative for substance abuse.      Observations/Objective: The patient appears to be in no acute distress  Assessment and Plan:  Problem List Items Addressed This Visit     Low back pain radiating to left lower extremity - Primary   Dr ONEIDA Kansky - spine injection  On Oxycodone  and MS Contin    Potential benefits of a long term opioids use as well as potential risks (i.e. addiction risk, apnea etc) and complications (i.e. Somnolence, constipation and others) were explained to the patient and were aknowledged.      Relevant Medications   morphine  (MS CONTIN ) 30 MG 12 hr tablet   Oxycodone  HCl 10 MG TABS   methylPREDNISolone  (MEDROL  DOSEPAK) 4 MG TBPK tablet   CKD (chronic kidney disease), stage III (HCC)   D/c Ibuprofen Hydrate well      Knee pain, right   Worse D/c ibuprofen due to CRI Medrol  pack po  On Oxycodone  and MS Contin    Potential benefits of a long term opioids use as well as potential risks (i.e.  addiction risk, apnea etc) and complications (i.e. Somnolence, constipation and others) were explained to the patient and were aknowledged.        Meds ordered this encounter  Medications   morphine  (MS CONTIN ) 30 MG 12 hr tablet    Sig: Take 1 tablet (30 mg total) by mouth every 12 (twelve) hours.    Dispense:  60 tablet    Refill:  0    Please fill on or after 05/19/24   Oxycodone  HCl 10 MG TABS    Sig: Take 1 tablet (10 mg total) by mouth 3 (three) times daily as needed.    Dispense:  90 tablet    Refill:  0    Please fill on or after 05/19/24   methylPREDNISolone  (MEDROL  DOSEPAK) 4 MG TBPK tablet    Sig: As directed    Dispense:  21 tablet    Refill:  0     Follow Up Instructions:    I discussed the assessment and treatment plan with the patient. The patient was provided an opportunity to ask questions and all were answered. The patient agreed with the plan and demonstrated an understanding of the instructions.   The patient was advised to call back or seek an in-person evaluation if the symptoms worsen or if the condition fails to improve as anticipated.  I provided face-to-face time during this encounter. We were at different locations.   Alex Kylen Schliep,  MD

## 2024-05-06 NOTE — Assessment & Plan Note (Signed)
 Worse D/c ibuprofen due to CRI Medrol  pack po  On Oxycodone  and MS Contin    Potential benefits of a long term opioids use as well as potential risks (i.e. addiction risk, apnea etc) and complications (i.e. Somnolence, constipation and others) were explained to the patient and were aknowledged.

## 2024-05-06 NOTE — Assessment & Plan Note (Signed)
 Dr ONEIDA Kansky - spine injection  On Oxycodone  and MS Contin    Potential benefits of a long term opioids use as well as potential risks (i.e. addiction risk, apnea etc) and complications (i.e. Somnolence, constipation and others) were explained to the patient and were aknowledged.

## 2024-05-06 NOTE — Assessment & Plan Note (Signed)
 D/c Ibuprofen Hydrate well

## 2024-05-16 ENCOUNTER — Ambulatory Visit: Admitting: Internal Medicine

## 2024-05-24 DIAGNOSIS — M1711 Unilateral primary osteoarthritis, right knee: Secondary | ICD-10-CM | POA: Diagnosis not present
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
## 2024-05-28 IMAGING — CT CT HEAD W/O CM
3 series · 15 of 47 positions shown, 18 images · non-contrast
Comparison: CT head 10/21/2015

CLINICAL DATA: Mental status change, unknown cause



[Series 2: head wo · axial · 0.44mm/px · z∈[-175,-45]mm · 9 of 32 slices shown, 12 images]
[im 3/32  brain]
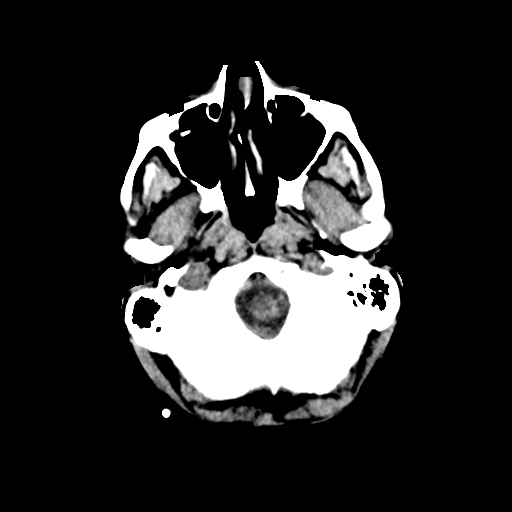
[im 3/32  bone]
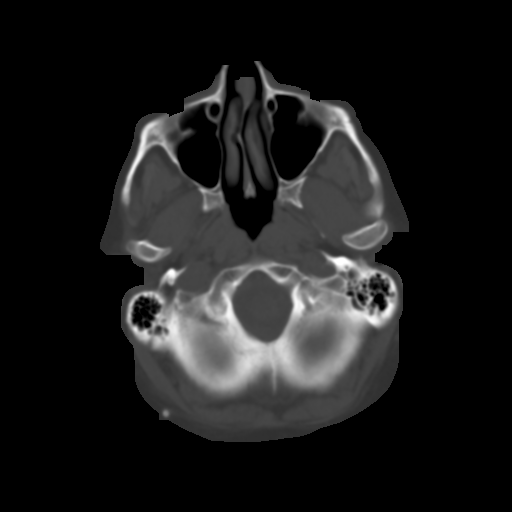
[im 6/32  brain]
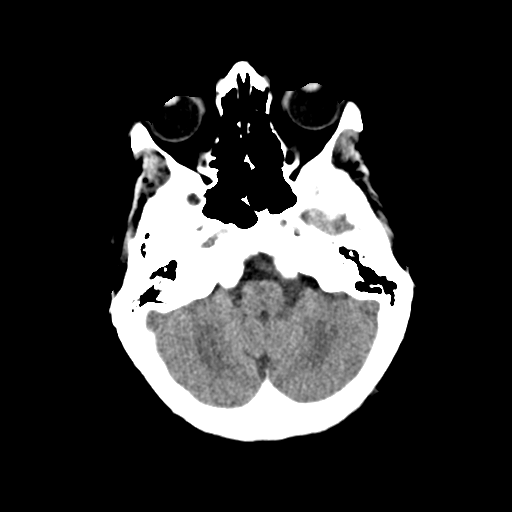
[im 9/32  brain]
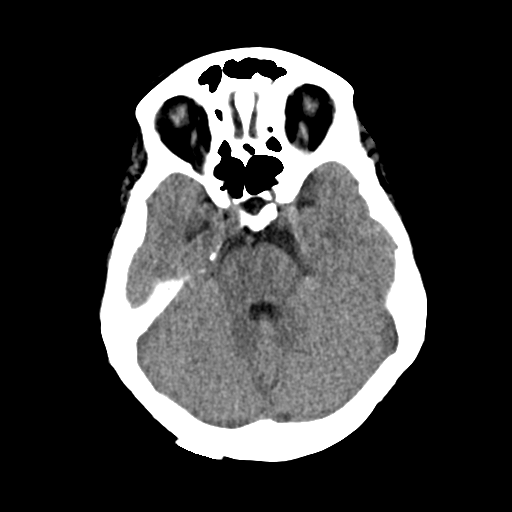
[im 12/32  brain]
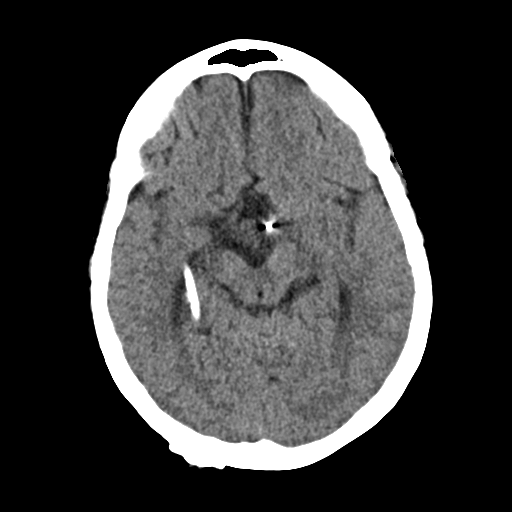
[im 17/32  brain]
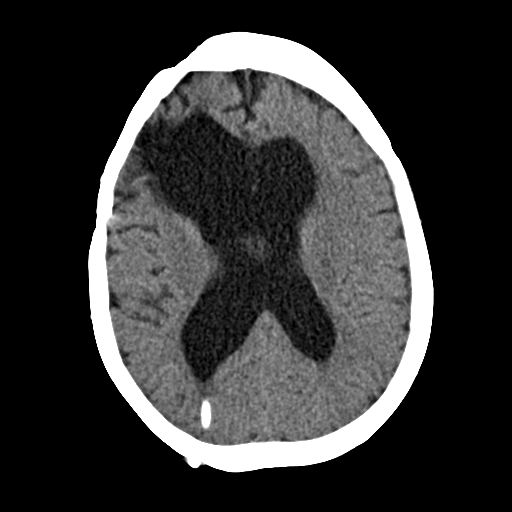
[im 17/32  bone]
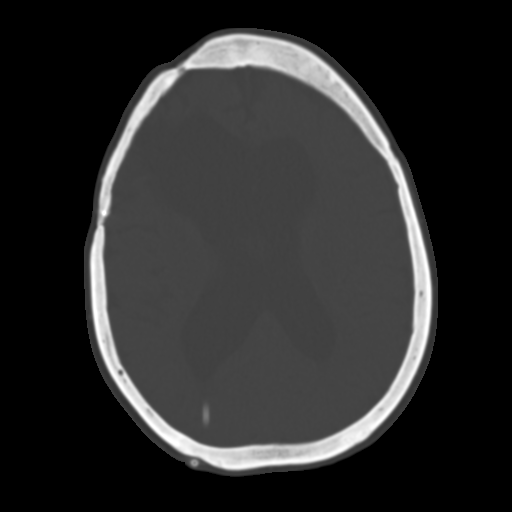
[im 20/32  brain]
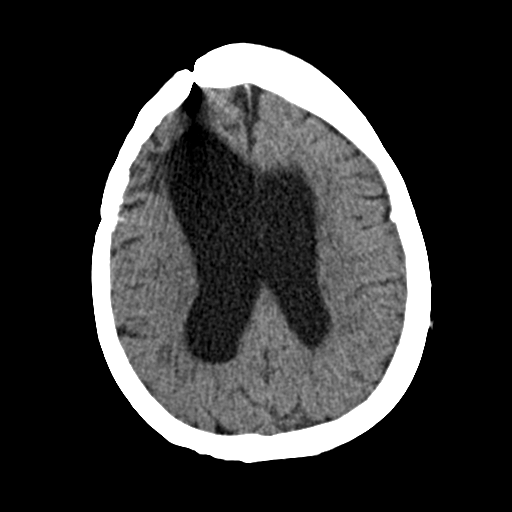
[im 23/32  brain]
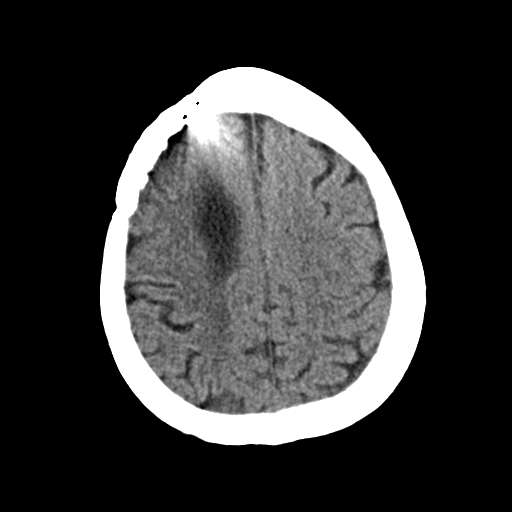
[im 26/32  brain]
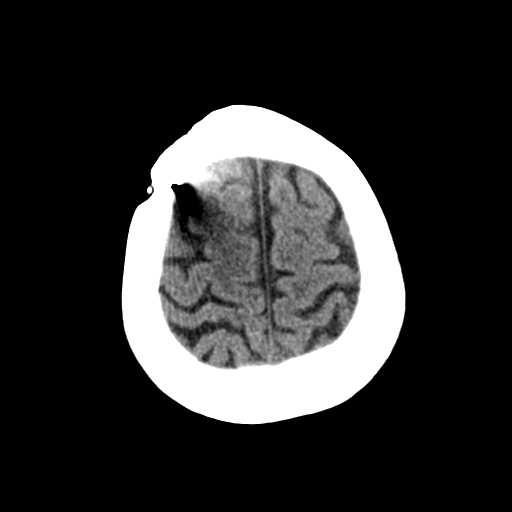
[im 29/32  brain]
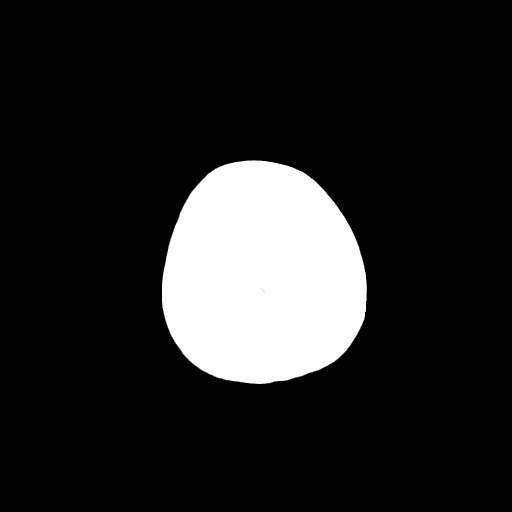
[im 29/32  bone]
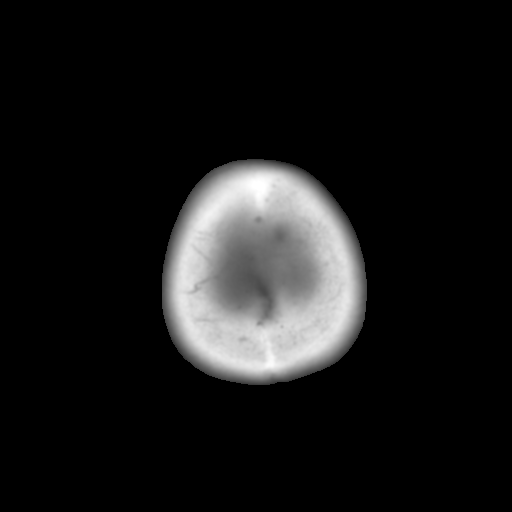

[Series 4: coronal soft · coronal · 0.34mm/px · 3 of 68 slices shown]
[im 23/68  brain]
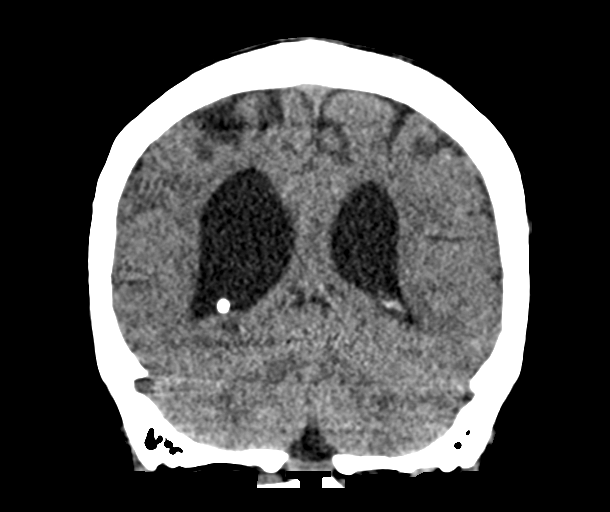
[im 30/68  brain]
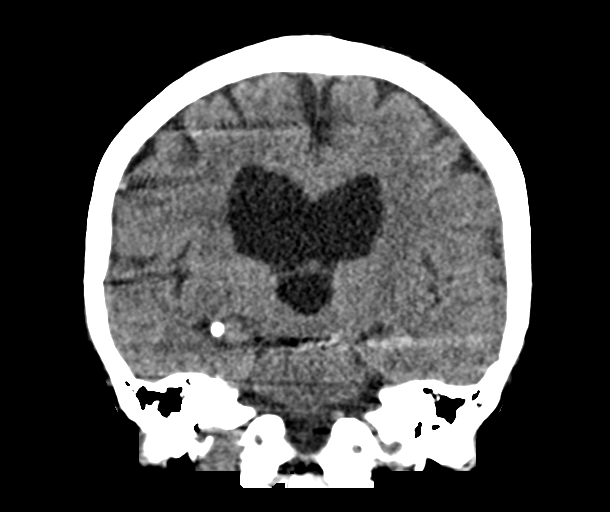
[im 38/68  brain]
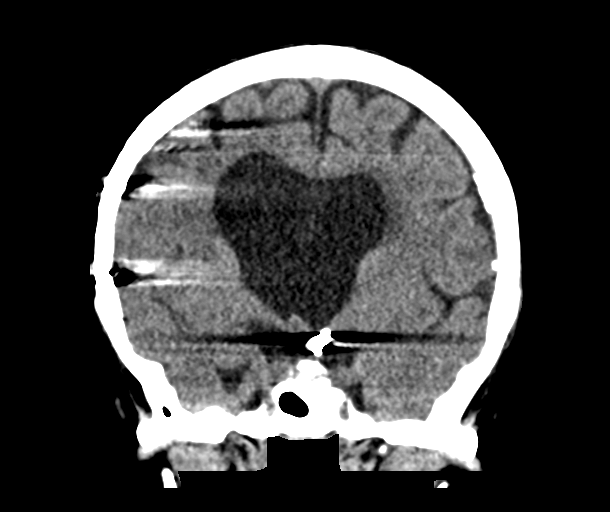

[Series 5: sag soft · sagittal · 0.33mm/px · 3 of 60 slices shown]
[im 20/60  brain]
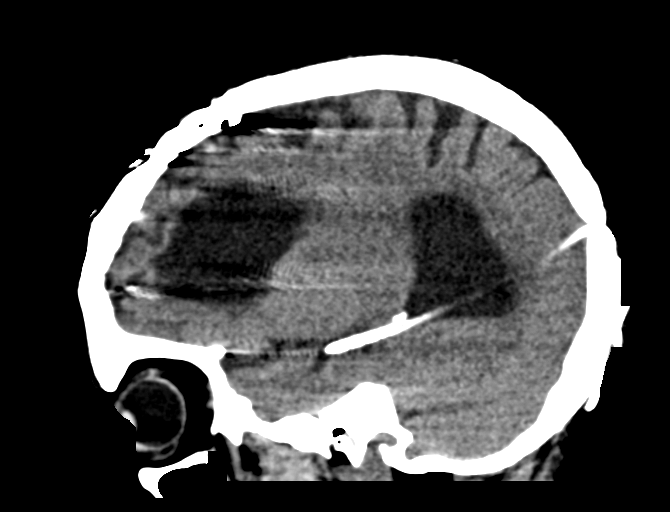
[im 30/60  brain]
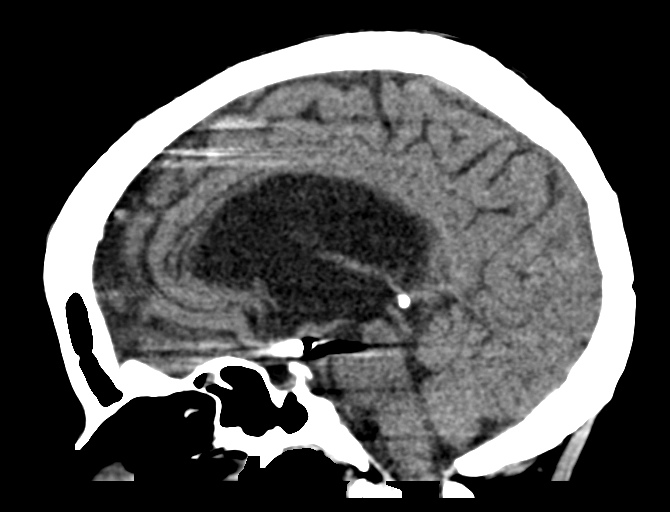
[im 40/60  brain]
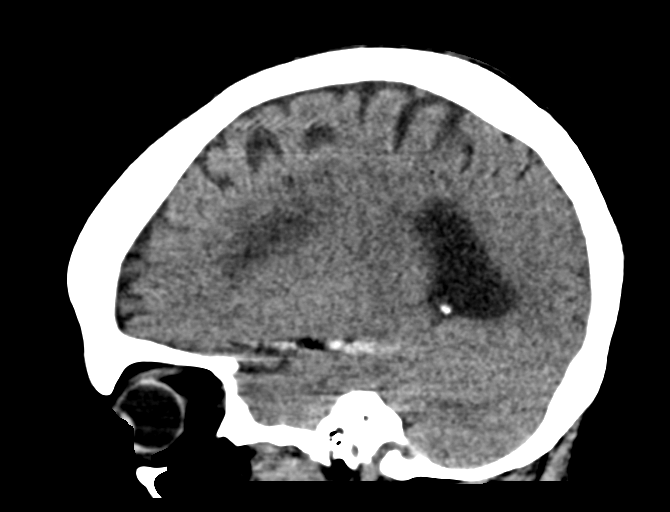

[15 of 47 positions shown; findings below may reference images not displayed]

BRAIN:
BRAIN
Similar-appearing right posterior porch ventriculoperitoneal shunt
with tip again noted to terminate within the inferior horn of the
right lateral ventricle. Right frontotemporal encephalomalacia.

No evidence of large-territorial acute infarction. No parenchymal
hemorrhage. No mass lesion. No extra-axial collection.

No mass effect or midline shift. Similar appearance of the lateral
ventricles and third ventricle. Basilar cisterns are patent.

Vascular: No hyperdense vessel. Vascular clipping again noted at the
central skull base.

Skull: No acute fracture or focal lesion.  Right frontal craniotomy.

Sinuses/Orbits: Paranasal sinuses and mastoid air cells are clear.
The orbits are unremarkable.

Other: None.
IMPRESSION: No acute intracranial abnormality in a patient with known right
posterior approach ventriculoperitoneal shunt. Stable size of
lateral and third ventricles compared to 8059.

## 2024-05-30 ENCOUNTER — Encounter: Payer: Self-pay | Admitting: Internal Medicine

## 2024-05-30 ENCOUNTER — Telehealth: Admitting: Internal Medicine

## 2024-05-30 DIAGNOSIS — G8929 Other chronic pain: Secondary | ICD-10-CM | POA: Diagnosis not present

## 2024-05-30 DIAGNOSIS — M545 Low back pain, unspecified: Secondary | ICD-10-CM | POA: Diagnosis not present

## 2024-05-30 DIAGNOSIS — M79605 Pain in left leg: Secondary | ICD-10-CM | POA: Diagnosis not present

## 2024-05-30 DIAGNOSIS — M25561 Pain in right knee: Secondary | ICD-10-CM

## 2024-05-30 MED ORDER — MORPHINE SULFATE ER 30 MG PO TBCR: 30.0000 mg | EXTENDED_RELEASE_TABLET | Freq: Two times a day (BID) | ORAL | 0 refills | Status: DC

## 2024-05-30 MED ORDER — OXYCODONE HCL 10 MG PO TABS: 10.0000 mg | ORAL_TABLET | Freq: Three times a day (TID) | ORAL | 0 refills | Status: DC | PRN

## 2024-05-30 NOTE — Progress Notes (Signed)
 Virtual Visit via Video Note  I connected with Samantha [name] on 05/30/24 at  3:20 PM EDT by a video enabled telemedicine application and verified that I am speaking with the correct person using two identifiers.   I discussed the limitations of evaluation and management by telemedicine and the availability of in person appointments. The patient expressed understanding and agreed to proceed.  I was located at our [facility] office. The patient was at home. There was no one else present in the visit.  Chief Complaint  Patient presents with   Knee Pain    Pt states she is having rt knee pain and is wanting to discuss possible surgery      History of Present Illness: C/o severe R knee pain she will see Dr Lavelle Ada F/u on chronic pain   Review of Systems  Constitutional:  Negative for fever and weight loss.  Cardiovascular:  Negative for chest pain.  Gastrointestinal:  Negative for blood in stool.  Musculoskeletal:  Positive for back pain, joint pain, myalgias and neck pain. Negative for falls.  Skin:  Negative for rash.     Observations/Objective: The patient appears to be in no acute distress  Assessment and Plan:  Problem List Items Addressed This Visit     Low back pain radiating to left lower extremity - Primary   On Oxycodone  and MS Contin    Potential benefits of a long term opioids use as well as potential risks (i.e. addiction risk, apnea etc) and complications (i.e. Somnolence, constipation and others) were explained to the patient and were aknowledged.      Relevant Medications   morphine  (MS CONTIN ) 30 MG 12 hr tablet   Oxycodone  HCl 10 MG TABS   Knee pain, right   Severe R knee pain she will see Dr Lavelle Ada        Meds ordered this encounter  Medications   morphine  (MS CONTIN ) 30 MG 12 hr tablet    Sig: Take 1 tablet (30 mg total) by mouth every 12 (twelve) hours.    Dispense:  60 tablet    Refill:  0    Please fill on or after 06/18/24. Code:  M54.50   Oxycodone  HCl 10 MG TABS    Sig: Take 1 tablet (10 mg total) by mouth 3 (three) times daily as needed.    Dispense:  90 tablet    Refill:  0    Please fill on or after 06/18/24. Code: M54.50     Follow Up Instructions:    I discussed the assessment and treatment plan with the patient. The patient was provided an opportunity to ask questions and all were answered. The patient agreed with the plan and demonstrated an understanding of the instructions.   The patient was advised to call back or seek an in-person evaluation if the symptoms worsen or if the condition fails to improve as anticipated.  I provided face-to-face time during this encounter. We were at different locations.   Marolyn Noel, MD

## 2024-05-30 NOTE — Assessment & Plan Note (Signed)
 On Oxycodone  and MS Contin    Potential benefits of a long term opioids use as well as potential risks (i.e. addiction risk, apnea etc) and complications (i.e. Somnolence, constipation and others) were explained to the patient and were aknowledged.

## 2024-05-30 NOTE — Assessment & Plan Note (Signed)
 Severe R knee pain she will see Dr Lavelle Ada

## 2024-06-11 ENCOUNTER — Ambulatory Visit: Payer: Self-pay
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
## 2024-07-01 ENCOUNTER — Telehealth: Admitting: Internal Medicine

## 2024-07-01 ENCOUNTER — Encounter: Payer: Self-pay | Admitting: Internal Medicine

## 2024-07-01 DIAGNOSIS — M25561 Pain in right knee: Secondary | ICD-10-CM | POA: Diagnosis not present

## 2024-07-01 DIAGNOSIS — G8929 Other chronic pain: Secondary | ICD-10-CM

## 2024-07-01 DIAGNOSIS — N179 Acute kidney failure, unspecified: Secondary | ICD-10-CM | POA: Diagnosis not present

## 2024-07-01 DIAGNOSIS — K2101 Gastro-esophageal reflux disease with esophagitis, with bleeding: Secondary | ICD-10-CM | POA: Diagnosis not present

## 2024-07-01 DIAGNOSIS — N1831 Chronic kidney disease, stage 3a: Secondary | ICD-10-CM | POA: Diagnosis not present

## 2024-07-01 MED ORDER — MORPHINE SULFATE ER 30 MG PO TBCR: 30.0000 mg | EXTENDED_RELEASE_TABLET | Freq: Two times a day (BID) | ORAL | 0 refills | Status: DC

## 2024-07-01 MED ORDER — METHYLPREDNISOLONE 4 MG PO TBPK: ORAL_TABLET | ORAL | 0 refills | Status: DC

## 2024-07-01 MED ORDER — OXYCODONE HCL 10 MG PO TABS: 10.0000 mg | ORAL_TABLET | Freq: Three times a day (TID) | ORAL | 0 refills | Status: DC | PRN

## 2024-07-01 NOTE — Assessment & Plan Note (Signed)
 Worsening R knee pain  - severe; pt has an appt to see Dr Georgina on 08/01/24 Medrol  pack Rx was emailed.  Potential benefits of a short term steroid  use as well as potential risks  and complications were explained to the patient and were aknowledged.
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
## 2024-07-01 NOTE — Assessment & Plan Note (Signed)
 Hydrate well Monitor GFR Avoid NSAIDs
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
## 2024-07-01 NOTE — Assessment & Plan Note (Signed)
 On Protonix bid

## 2024-07-03 ENCOUNTER — Telehealth: Payer: Self-pay

## 2024-07-03 NOTE — Telephone Encounter (Signed)
 Copied from CRM [id number]. Topic: Clinical - Medical Advice >> Jul 02, 2024  3:49 PM Samantha [name] wrote: Reason for CRM: Patient is calling to advise Dr Garald the steroid she used last time was Kenalog  w/ lidocaine  in it as well. Wants to know if its safe for her to pick up the prednisone  pill and start taking it. Please contact her back to advise her.

## 2024-07-08 ENCOUNTER — Ambulatory Visit: Payer: Self-pay

## 2024-07-08 NOTE — Telephone Encounter (Signed)
 FYI Only or Action Required?: Action required by provider: clinical question for provider.  Patient was last seen in primary care on 07/01/2024 by Plotnikov, Karlynn GAILS, MD.  Called Nurse Triage reporting No chief complaint on file..  Symptoms began several days ago.  Interventions attempted: Nothing.  Symptoms are: stable.  Triage Disposition: Call PCP When Office is Open  Patient/caregiver understands and will follow disposition?: YesCopied from CRM #[id number]. Topic: Clinical - Medical Advice >> Jul 08, 2024  3:36 PM Mesmerise C wrote: Reason for CRM: Patient checking about inquiry about if she can take prednisone  with a steroid she's taking, states she hasn't picked up the prednisone  yet so there hasn't been a reaction but wanted to check with provider if the two can be taken together Reason for Disposition  [1] Caller requesting NON-URGENT health information AND [2] PCP's office is the best resource  Answer Assessment - Initial Assessment Questions 1. REASON FOR CALL: What is the main reason for your call? or How can I best help you?   Pt returning office call. Pt hasn't taken steroid yet. Pt is asking if ok. Please call back and advise  Protocols used: Information Only Call - No Triage-A-AH

## 2024-07-08 NOTE — Telephone Encounter (Signed)
 What kind of reaction did she have?  Thanks

## 2024-07-08 NOTE — Telephone Encounter (Signed)
 Tried to reach and ask her the following per PCP  What kind of reaction did she have?  Thanks

## 2024-07-10 NOTE — Telephone Encounter (Signed)
 I do not understand the question.  I do not see another steroid on the medication list.  Thanks

## 2024-07-10 NOTE — Addendum Note (Signed)
 Addended by: Abbigale Mcelhaney V on: 07/10/2024 08:07 AM   Modules accepted: Orders

## 2024-07-13 ENCOUNTER — Other Ambulatory Visit: Payer: Self-pay | Admitting: Internal Medicine

## 2024-07-19 ENCOUNTER — Ambulatory Visit (HOSPITAL_BASED_OUTPATIENT_CLINIC_OR_DEPARTMENT_OTHER): Attending: Internal Medicine | Admitting: Internal Medicine

## 2024-07-19 DIAGNOSIS — G4733 Obstructive sleep apnea (adult) (pediatric): Secondary | ICD-10-CM

## 2024-07-19 DIAGNOSIS — G4710 Hypersomnia, unspecified: Secondary | ICD-10-CM | POA: Diagnosis not present

## 2024-07-19 NOTE — Progress Notes (Signed)
  [id number] 

## 2024-07-22 NOTE — Progress Notes (Deleted)
 HPI female never smoker followed for OSA, complicated by history of migraine, hydrocephalus/VP shunt/  and concern for nocturnal seizures, Hx CVA for which she is followed by Neurology, back pain NPSG 03/08/15 at [facility] with full seizure montage- AHI 12.1/ hr, desaturation to 91%, no seizure activity   ------------------------------------------------------------------------------------------------   03/15/17- 58 year old female never smoker followed by Dr. Shellia for OSA, complicated by history of migraine, hydrocephalus/VP shunt and concern for nocturnal seizures, Hx CVA for which she is followed by Neurology, back pain CPAP auto 5-15/[facility] FOLLOWS FOR DME IS [facility]  Down load is attached patient wears cpap every night and with napping  Download 93%/4 hours, AHI 0.7/hour. Uses melatonin for sleep along with her Cymbalta  and says this works well. Using CPAP routinely including with naps, taking a nap most afternoons. She is quite satisfied and definitely sleeps better with CPAP. Settled on a nasal mask.  04/22/24- 58 year old female never smoker followed by Dr. Shellia for OSA, complicated by history of migraine, hydrocephalus/VP shunt and concern for nocturnal seizures, Hx CVA for which she is followed by Neurology, back pain CPAP auto 5-15/[facility]   Download compliance- LOV- [employer] [facility] NP 09/20/2019- [facility], CPAP 5-15, good compliance and control at that time. Body weight today-142 lbs Discussed the use of AI scribe software for clinical note transcription with the patient, who gave verbal consent to proceed.  History of Present Illness   Samantha [name] is a 58 year old female with sleep apnea who presents with persistent daytime sleepiness despite CPAP use.  She experiences persistent daytime sleepiness despite regular CPAP use since her sleep apnea diagnosis in 2016. She easily falls asleep during activities such as watching movies, a problem that predates CPAP therapy. Her CPAP machine  is old, and she cannot determine its settings. She snores lightly even with CPAP, and her husband often adjusts her nasal mask. She talks in her sleep and experiences twitching movements but has no history of sleepwalking or violent movements during sleep. She takes Tylenol  PM nightly to aid sleep and consumes one cup of coffee in the morning and some sodas during the day, switching to decaf later. She naps most days for a couple of hours, which sometimes helps her feel better. No ENT surgery. Has dentures.     Assessment and Plan:    Obstructive Sleep Apnea Chronic obstructive sleep apnea with persistent symptoms despite CPAP use. CPAP machine outdated, settings unknown. Long uvula may contribute to obstruction. She preferred in-lab sleep study for accurate assessment. - Order in-lab split night sleep [facility]. [facility] insurance authorization for sleep study.  Chronic Pain Chronic pain in back, knee, and hip. Pain management not discussed.  Stroke Stroke in 2014. No current neurological symptoms. V-P shunt in place of NPH  Cameron Lesions Cameron lesions on hiatal hernia, previously cauterized. No current gastrointestinal bleeding.    07/23/24-  58 year old female never smoker followed for OSA, Somnolence, complicated by history of migraine, hydrocephalus/VP shunt and concern for nocturnal seizures, Hx CVA for which she is followed by Neurology, back pain CPAP auto 5-15/[facility]  17 Download compliance- Updated sleep study 07/19/24-Split Night- AHI(4%) 7.2/hr, desat to 87%, mean 91.9%. Titrated to CPAP 12 >>    Suggest CPAP 5-15 and add modafinil <<    ROS-see HPI   + = pos Constitutional:    weight loss, night sweats, fevers, chills, fatigue, lassitude. HEENT:    headaches, difficulty swallowing, tooth/dental problems, sore throat,  sneezing, itching, ear ache, nasal congestion, post nasal drip, snoring CV:    chest pain, orthopnea, PND,  swelling in lower extremities, anasarca,                                                         dizziness, palpitations Resp:   shortness of breath with exertion or at rest.                productive cough,   non-productive cough, coughing up of blood.              change in color of mucus.  wheezing.   Skin:    rash or lesions. GI:  No-   heartburn, indigestion, abdominal pain, nausea, vomiting, diarrhea,                 change in bowel habits, loss of appetite GU: dysuria, change in color of urine, no urgency or frequency.   flank pain. MS:   joint pain, stiffness, decreased range of motion, back pain. Neuro-     nothing unusual Psych:  change in mood or affect.  depression or anxiety.   memory loss.  OBJ- Physical Exam       General- Alert, Oriented, Affect-appropriate, Distress- none acute, +short Skin- rash-none, lesions- none, excoriation- none Lymphadenopathy- none Head- atraumatic, + R VP shunt            Eyes- Gross vision intact, PERRLA, conjunctivae and secretions clear            Ears- Hearing, canals-normal            Nose- Clear, no-Septal dev, mucus, polyps, erosion, perforation             Throat- Mallampati III-IV , mucosa clear , drainage- none, tonsils+present Neck- flexible , trachea midline, no stridor , thyroid  nl, carotid no bruit Chest - symmetrical excursion , unlabored           Heart/CV- RRR , no murmur , no gallop  , no rub, nl s1 s2                           - JVD- none , edema- none, stasis changes- none, varices- none           Lung- clear to P&A, wheeze- none, cough- none , dullness-none, rub- none           Chest wall-  Abd-  Br/ Gen/ Rectal- Not done, not indicated Extrem- cyanosis- none, clubbing, none, atrophy- none, strength- nl Neuro- grossly intact to observation

## 2024-07-23 ENCOUNTER — Ambulatory Visit: Admitting: Internal Medicine

## 2024-07-27 ENCOUNTER — Other Ambulatory Visit: Payer: Self-pay | Admitting: Internal Medicine

## 2024-07-28 NOTE — Procedures (Signed)
 Samantha [name] [address] [place], [place] [address] Tel: [phone]   Fax: [phone]  Split Night Interpretation  Patient Name:  Samantha [name], Samantha [name] Date:  07/19/2024 Referring Physician:  REGGY SALT [phone]) %%startinterp%% Indications for Polysomnography The patient is a 58 year old Female who is 4' 10 and weighs 132.0 lbs.  Her BMI equals 27.7.  A diagnostic polysomnogram was performed to evaluate for -.OSA  After 125.5 minutes of sleep time the patient exhibited sufficient respiratory events qualifying her for a CPAP trial which was then initiated.    Medications Taken:  NO MEDICATIONS TAKEN.   Polysomnogram Data A full night polysomnogram was performed recording the standard physiologic parameters including EEG, EOG, EMG, EKG, nasal and oral airflow.  Respiratory parameters of chest and abdominal movements are recorded with Piezo-Crystal motion transducers.  Oxygen saturation was recorded by pulse oximetry.    Sleep Architecture The total recording time of the diagnostic portion of the study was 220.0 minutes.  The total sleep time was 125.5 minutes.  During the diagnostic portion of the study, the patient spent 4.0% of total sleep time in Stage N1, 91.2% in Stage N2, 4.8% in Stages N3, and 0.0% in REM.   Sleep latency was 30.0 minutes.  REM latency was - minutes.  Sleep Efficiency was 57.0%.  Wake after Sleep Onset time was 64.5 minutes.   At 01:54:56 AM the patient was placed on PAP treatment and was titrated at pressures ranging from 12/2** cm/H20 with supplemental oxygen at - up to 12* cm/H20 with supplemental oxygen at -.  The total recording time of the treatment portion of the study was 187.2 minutes.  The total sleep time was 176.0 minutes.  During the treatment portion of the study, the patient spent 2.6% of total sleep time in Stage N1, 38.4% in Stage N2, 56.8% in Stages N3, and 2.3% in REM.   Sleep latency was 5.0 minutes.  REM  latency was 96.5 minutes.  Sleep Efficiency was 94.0%.  Wake after Sleep Onset time was 6.5 minutes.  Respiratory Events During the diagnostic portion of the study, the polysomnogram revealed a presence of - obstructive, 3 centrals, and - mixed apneas resulting in an Apnea index of 1.4 events per hour.  There were 52 hypopneas (>=3% desaturation and/or arousal) resulting in an Apnea\Hypopnea Index (AHI >=3% desaturation and/or arousal) of 26.3 events per hour.  There were 12 hypopneas (>=4% desaturation) resulting in an Apnea\Hypopnea Index (AHI >=4% desaturation) of 7.2 events per hour.  There were 41 Respiratory Effort Related Arousals resulting in a RERA index of 19.6 events per hour. The Respiratory Disturbance Index is 45.9 events per hour.  The snore index was 191.2 events per hour.  Mean oxygen saturation was 91.9%.  The lowest oxygen saturation during sleep was 87.0%.  Time spent <=88% oxygen saturation was 0.5 minutes (0.2%).  During the treatment portion of the study, the polysomnogram revealed a presence of 1 obstructive, 16 central, and 3 mixed apneas resulting in an Apnea index of 6.8 events per hour.  There were 15 hypopneas (>=3% desaturation and/or arousal) resulting in an Apnea\Hypopnea Index (AHI >=3% desaturation and/or arousal) of 11.9 events per hour.  There were 3 hypopneas (>=4% desaturation) resulting in an Apnea\Hypopnea Index (AHI >=4% desaturation) of 7.8 events per hour.  There were 52 Respiratory Effort Related Arousals resulting in a RERA index of 17.7 events per hour. The Respiratory Disturbance Index is 29.7 events per hour.  The  snore index was 34.8 events per hour.  Mean oxygen saturation was 93.4%.  The lowest oxygen saturation during sleep was 89.0%.  Time spent <=88% oxygen saturation was - minutes (-).  Limb Activity During the diagnostic portion of the study, there were 44 limb movements recorded.  Of this total, 44 were classified as PLMs.  Of the PLMs, - were  associated with arousals.  The Limb Movement index was 21.0 per hour while the PLM index was 21.0 per hour.  During the treatment portion of the study, there were 4 limb movements recorded.  Of this total, 4 were classified as PLMs.  Of the PLMs, - were associated with arousals.  The Limb Movement index was 1.4 per hour while the PLM index was 1.4 per hour.  Cardiac Summary During the diagnostic portion of the study, the average pulse rate was 69.3 bpm.  The minimum pulse rate was 59.0 bpm while the maximum pulse rate was 87.0 bpm.  During the treatment portion of the study, the average pulse rate was 58.4 bpm.  The minimum pulse rate was 52.0 bpm while the maximum pulse rate was 74.0 bpm.   Comment : Modeate obstructive sleep apnea, AHI(3%) 26.3/hr. Snoring with oxygen desaturation to a nadir of 87%, mean 91.9%. CPAP was titrated to13 cwp, associated with treatment-emergent central apneas. Optimum titration appeared to be 7 cwp with residual AHI(3%) 6.5/hr, desaturation to 91%, mean 93.1%.  Diagnosis: Obstructive sleep apnea  Recommendations: Suggest autopap 5-15. Alternatively consider CPAP/ BIPAP dedicated titration study. Patient wore a small ResMed AirFit N20 nasal mask with heated humidification.   This study was personally reviewed and electronically signed by: NEYSA REGGY BIRCH, MD Accredited Board Certified in Sleep Medicine Date/Time: 07/28/24  12:13    %%endinterp%%  Split Night Report  Patient Name: Samantha [name] Study Date: 07/19/2024  Date of Birth: 06/03/1966 Study Type: Split Night  Age: 58 year MRN #: [id number]  Sex: Female Interpreting Physician: NEYSA REGGY, [id number]  Height: 4' 10 Referring Physician: REGGY NEYSA [phone])  Weight: 132.0 lbs Recording Tech: Charlie George RPSGT  BMI: 27.7 Scoring Tech: Charlie George RPSGT  ESS: 17 Neck Size: 14.25  Mask Type RESMED AIRFIT N20 NASAL Final Pressure: 13 cm H2O  Mask Size: SMALL Supplemental O2: N/A   Study  Overview  DIAGNOSTIC TREATMENT  Lights Off: 10:14:44 PM Lights Off: 01:54:44 AM  Lights On: 01:54:44 AM Lights On: 05:01:54 AM  Time in Bed: 220.0 min. Time in Bed: 187.2 min.  Total Sleep Time: 125.5 min. Total Sleep Time: 176.0 min.  Sleep Efficiency: 57.0% Sleep Efficiency: 94.0%  Sleep Latency: 30.0 min. Sleep Latency: 5.0 min.  REM Latency from Sleep Onset: - min. REM Latency from Sleep Onset: 96.5 min.  Wake After Sleep Onset: 64.5 min. Wake After Sleep Onset: 6.5 min.   DIAGNOSTIC TREATMENT   Count Index  Count Index  Awakenings: 10 4.8 Awakenings: 8 2.7  Arousals: 60 28.7 Arousals: 58 19.8  AHI (>=3% Desat and/or Ar.): 55 26.3 AHI (>=3% Desat and/or Ar.): 35 11.9  AHI (>=4% Desat): 15 7.2 AHI (>=4% Desat): 23 7.8   Limb Movements: 44 21.0 Limb Movements: 4 1.4  Snore: 400 191.2 Snore: 102 34.8  Desaturations: 57 27.3 Desaturations: 26 8.9  Minimum SpO2 TST: 87.0% Minimum SpO2 TST: 89.0%    Sleep Architecture   DIAGNOSTIC TREATMENT ENTIRE NIGHT  Stages Time (mins) % Sleep Time Time (mins) % Sleep Time Time (mins) % Sleep Time  Wake 95.0  11.5  106.5  Stage N1 5.0 4.0% 4.5 2.6% 9.5 3.2%  Stage N2 114.5 91.2% 67.5 38.4% 182.0 60.4%  Stage N3 6.0 4.8% 100.0 56.8% 106.0 35.2%  REM 0.0 0.0% 4.0 2.3% 4.0 1.3%   Arousal Summary   DIAGNOSTIC TREATMENT   NREM REM TST Index NREM REM TST Index  Respiratory Ar. 57 - 57 27.3 55 2 57 19.4  PLM Ar. - - - - - - - -  Isolated Limb Movement Ar. - - - - - - - -  Snore Ar. - - - - - - - -  Spontaneous Ar. 3 - 3 1.4 1 - 1 0.3  Total Ar. 60 - 60 28.7 56 2 58 19.8    Respiratory Summary  DIAGNOSTIC By Sleep Stage By Body Position Total   NREM REM Supine Non-Supine   Time (min) 125.5 0.0 10.5 115.0 125.5         Obstructive Apnea - - - - -  Mixed Apnea - - - - -  Central Apnea 3 - - 3 3  Total Apneas 3 - - 3 3  Total Apnea Index 1.4 - - 1.6 1.4         Hypopneas (>=3% Desat and/or Ar.) 52 - - 52 52  AHI (>=3% Desat and/or  Ar.) 26.3 - - 28.7 26.3         Hypopneas (>=4% Desat) 12 - - 12 12  AHI (>=4% Desat) 7.2 - - 7.8 7.2          RERAs 41 - 2 39 41  RERA Index 19.6 - 11.4 20.3 19.6         RDI 45.9 - 11.4 49.0 45.9    TREATMENT By Sleep Stage By Body Position Total   NREM REM Supine Non-Supine   Time (min) 172.0 4.0 - 176.0 176.0         Obstructive Apnea 1 - - 1 1  Mixed Apnea 3 - - 3 3  Central Apnea 16 - - 16 16  Total Apneas 20 - - 20 20  Total Apnea Index 7.0 - - 6.8 6.8         Hypopneas (>=3% Desat and/or Ar.) 15 - - 15 15  AHI (>=3% Desat and/or Ar.) 12.2 - - 11.9 11.9         Hypopneas (>=4% Desat) 3 - - 3 3  AHI (>=4% Desat) 8.0 - - 7.8 7.8          RERAs 50 2 - 52 52  RERA Index 17.4 30.0 - 17.7 17.7         RDI 29.7 30.0 - 29.7 29.7    Respiratory Event Durations   DIAGNOSTIC TREATMENT  Apnea NREM REM NREM REM  Average (seconds) 11.6 - 12.1 -  Maximum (seconds) 12.7 - 18.9 -  Hypopnea      Average (seconds) 21.7 - 24.4 -  Maximum (seconds) 36.9 - 33.6 -    Limb Movement Summary   DIAGNOSTIC TREATMENT   Count Index Count Index  Isolated Limb Movements - - - -  Periodic Limb Movements (PLMs) 44 21.0 4 1.4  Total Limb Movements 44 21.0 4 1.4    Oxygen Saturation Summary   DIAGNOSTIC TREATMENT   Wake NREM REM TST Wake NREM REM TST  Average SpO2 92.6% 91.5% - 91.5% 93.5% 93.4% 93.7% 93.4%  Minimum SpO2 88.0% 87.0% - 87.0%  91.0% 89.0% 92.0% 89.0%   Maximum SpO2 99.0% 97.0% - 97.0%  97.0% 98.0% 95.0%  98.0%    DIAGNOSTIC Oxygen Saturation Distribution  Range (%) Time in range (min) Time in range (%)   90.0 - 100.0 198.5 90.4%  80.0 - 90.0 18.4 8.4%  70.0 - 80.0 - -  60.0 - 70.0 - -  50.0 - 60.0 - -  0.0 - 50.0 - -  Time Spent <=88% SpO2  Range (%) Time in range (min) Time in range (%)  0.0 - 88.0 0.5 0.2%      Count Index  Desaturations: 57 27.3   TREATMENT Oxygen Saturation Distribution  Range (%) Time in range (min) Time in range (%)   90.0 -  100.0 182.0 98.3%  80.0 - 90.0 0.4 0.2%  70.0 - 80.0 - -  60.0 - 70.0 - -  50.0 - 60.0 - -  0.0 - 50.0 - -  Time Spent <=88% SpO2  Range (%) Time in range (min) Time in range (%)  0.0 - 88.0 - -      Count Index  Desaturations: 26 8.9     Cardiac Summary   DIAGNOSTIC TREATMENT   Wake NREM REM Total Wake NREM REM Total  Average Pulse Rate (BPM) 69.5 69.2 - 69.3 64.0 58.2 56.0 58.4  Minimum Pulse Rate (BPM) 59.0 62.0 - 59.0 53.0 52.0 54.0 52.0  Maximum Pulse Rate (BPM) 87.0 87.0 - 87.0 74.0 72.0 59.0 74.0   Pulse Rate Distribution   DIAGNOSTIC  Range (bpm) Time in range (min) Time in range (%)  0.0 - 40.0 - -  40.0 - 60.0 2.5 1.1%  60.0 - 80.0 212.2 96.6%  80.0 - 100.0 2.3 1.0%  100.0 - 120.0 - -  120.0 - 140.0 - -  140.0 - 200.0 - -   TREATMENT  Range (bpm) Time in range (min) Time in range (%)  0.0 - 40.0 - -  40.0 - 60.0 131.9 70.7%  60.0 - 80.0 51.5 27.6%  80.0 - 100.0 - -  100.0 - 120.0 - -  120.0 - 140.0 - -  140.0 - 200.0 - -    Titration Summary  PAP Device PAP Level O2 Level Time (min) Wake (min) NREM (min) REM (min) Sleep Eff% OA# CA# MA# Hyp# (>=3%) AHI (>=3%) Hyp# (>=4%) AHI (>=%4) RERA RDI OSat <=88% (min) Min [employer] Ar. Index  - Off - 220.5 95.0 125.5 0.0 56.9% - 3 - 52 26.3 12  7.2 41  45.9  0.5 87.0 91.5 28.7  CPAP EPR 12/2 - 17.5 1.0 16.5 0.0 94.3% - - - 2 7.3 1  3.6 3  18.2  0.0 89.0 93.6 7.3  CPAP EPR 13/2 - 29.0 0.0 29.0 0.0 100.0% - 9 1 - 20.7 -  20.7 9  39.3  0.0 91.0 93.8 18.6  CPAP 5 - 24.5 7.5 17.0 0.0 69.4% - - - 6 21.2 1  3.5 6  42.4  0.0 90.0 92.8 31.8  CPAP 7 - 19.0 0.5 18.5 0.0 97.4% - - 1 1 6.5 -  3.2 2  13.0  0.0 91.0 93.1 6.5  CPAP 8 - 24.5 0.0 24.5 0.0 100.0% - 3 - - 7.3 -  7.3 6  22.0  0.0 91.0 93.4 14.7  CPAP 9 - 23.0 2.0 21.0 0.0 91.3% - 2 1 2  14.3 -  8.6 13  51.4  0.0 90.0 93.4 37.1  CPAP 10 - 17.0 0.0 13.0 4.0 100.0% - - - 1 3.5 -  - 4  17.6  0.0 92.0 93.5 14.1  CPAP 11 - 27.5 0.5 27.0 0.0 98.2% 1 - - 2 6.7 1   4.4 9  26.7  0.0 90.0 93.4 24.4  CPAP 12 - 5.5 0.0 5.5 0.0 100.0% - 2 - 1 32.7 -  21.8 -  32.7  0.0 91.0 93.5 21.8    Hypnograms                           Technologist Comments  The patient arrived for a split night study. The patient was placed in room 6. The patient is currently on CPAP. The procedure was explained, and no questions were asked. Prior to the start of the study, the patient was fitted for a mask. The patient met split night criteria and was placed on CPAP. CPAP was started at a pressure of 5 cm H2O and was adjusted to a pressure of 13 cm H2O with an EPR of 2. The patient used a small ResMed AirFit N20 nasal mask during the titration.  The patient slept in the supine and right positions. Mild PLMs were observed. No oral venting, bruxism, seizure or spike wave activity was observed. All stages of sleep were recorded. Supine REM was not achieved. Snoring was moderate and audible during the diagnostic portion of the study, but was eliminated with PAP therapy. No ECG events were observed. The patient had no bathroom breaks.                           Reggy Salt Diplomate, Biomedical engineer of Sleep Medicine  ELECTRONICALLY SIGNED ON:  07/28/2024, 12:03 PM [name] PH: [phone]   FX: [phone] ACCREDITED BY THE AMERICAN ACADEMY OF SLEEP MEDICINE

## 2024-07-28 NOTE — Procedures (Signed)
 Indications for Polysomnography The patient is a 58 year old Female who is 4' 10 and weighs 132.0 lbs.  Her BMI equals 27.7.  A diagnostic polysomnogram was performed to evaluate for -.  After 125.5 minutes of sleep time the patient exhibited sufficient respiratory events qualifying  her for a CPAP trial which was then initiated.  Medications Taken:NO MEDICATIONS TAKEN. Polysomnogram Data A full night polysomnogram was performed recording the standard physiologic parameters including EEG, EOG, EMG, EKG, nasal and oral airflow.  Respiratory parameters of chest and abdominal movements are recorded with Piezo-Crystal motion transducers.   Oxygen saturation was recorded by pulse oximetry.  Sleep Architecture The total recording time of the diagnostic portion of the study was 220.0 minutes.  The total sleep time was 125.5 minutes.  During the diagnostic portion of the study, the patient spent 4.0% of total sleep time in Stage N1, 91.2% in Stage N2, 4.8% in  Stages N3, and 0.0% in REM.   Sleep latency was 30.0 minutes.  REM latency was - minutes.  Sleep Efficiency was 57.0%.  Wake after Sleep Onset time was 64.5 minutes.  At 01:54:56 AM the patient was placed on PAP treatment and was titrated at pressures ranging from 12/2** cm/H20 with supplemental oxygen at - up to 12* cm/H20 with supplemental oxygen at -.  The total recording time of the treatment portion of the study  was 187.2 minutes.  The total sleep time was 176.0 minutes.  During the treatment portion of the study, the patient spent 2.6% of total sleep time in Stage N1, 38.4% in Stage N2, 56.8% in Stages N3, and 2.3% in REM.   Sleep latency was 5.0 minutes.  REM  latency was 96.5 minutes.  Sleep Efficiency was 94.0%.  Wake after Sleep Onset time was 6.5 minutes.  Respiratory Events During the diagnostic portion of the study, the polysomnogram revealed a presence of - obstructive, 3 centrals, and - mixed apneas resulting in an Apnea index of  1.4 events per hour.  There were 52 hypopneas (GreaterEqual to3% desaturation and/or  arousal) resulting in an Apnea\Hypopnea Index (AHI GreaterEqual to3% desaturation and/or arousal) of 26.3 events per hour.  There were 12 hypopneas (GreaterEqual to4% desaturation) resulting in an Apnea\Hypopnea Index (AHI GreaterEqual to4% desaturation)  of 7.2 events per hour.  There were 41 Respiratory Effort Related Arousals resulting in a RERA index of 19.6 events per hour. The Respiratory Disturbance Index is 45.9 events per hour.  The snore index was 191.2 events per hour.  Mean oxygen saturation  was 91.9%.  The lowest oxygen saturation during sleep was 87.0%.  Time spent LessEqual to88% oxygen saturation was  minutes ().  During the treatment portion of the study, the polysomnogram revealed a presence of 1 obstructive, 16 central, and 3 mixed apneas resulting in an Apnea index of 6.8 events per hour.  There were 15 hypopneas (GreaterEqual to3% desaturation and/or arousal)  resulting in an Apnea\Hypopnea Index (AHI GreaterEqual to3% desaturation and/or arousal) of 11.9 events per hour.  There were 3 hypopneas (GreaterEqual to4% desaturation) resulting in an Apnea\Hypopnea Index (AHI GreaterEqual to4% desaturation) of 7.8  events per hour.  There were 52 Respiratory Effort Related Arousals resulting in a RERA index of 17.7 events per hour. The Respiratory Disturbance Index is 29.7 events per hour.  The snore index was 34.8 events per hour.  Mean oxygen saturation was  93.4%.  The lowest oxygen saturation during sleep was 89.0%.  Time spent LessEqual to88% oxygen saturation was  minutes ().  Limb Activity During the diagnostic portion of the study, there were 44 limb movements recorded.  Of this total, 44 were classified as PLMs.  Of the PLMs, - were associated with arousals.  The Limb Movement index was 21.0 per hour while the PLM index was 21.0 per  hour.  During the treatment portion of the study, there were  4 limb movements recorded.  Of this total, 4 were classified as PLMs.  Of the PLMs, - were associated with arousals.  The Limb Movement index was 1.4 per hour while the PLM index was 1.4 per hour.  Cardiac Summary During the diagnostic portion of the study, the average pulse rate was 69.3 bpm.  The minimum pulse rate was 59.0 bpm while the maximum pulse rate was 87.0 bpm.  During the treatment portion of the study, the average pulse rate was 58.4 bpm.  The minimum pulse rate was 52.0 bpm while the maximum pulse rate was 74.0 bpm.  Comments :  Diagnosis:  Recommendations:   This study was personally reviewed and electronically signed by: NEYSA REGGY BIRCH, MD Accredited Board Certified in Sleep Medicine Date/Time:

## 2024-08-01 ENCOUNTER — Encounter: Payer: Self-pay | Admitting: Internal Medicine

## 2024-08-01 ENCOUNTER — Telehealth (INDEPENDENT_AMBULATORY_CARE_PROVIDER_SITE_OTHER): Admitting: Internal Medicine

## 2024-08-01 ENCOUNTER — Ambulatory Visit: Admitting: Internal Medicine

## 2024-08-01 VITALS — Ht <= 58 in | Wt 134.0 lb

## 2024-08-01 DIAGNOSIS — M25561 Pain in right knee: Secondary | ICD-10-CM

## 2024-08-01 DIAGNOSIS — I10 Essential (primary) hypertension: Secondary | ICD-10-CM | POA: Diagnosis not present

## 2024-08-01 DIAGNOSIS — G8929 Other chronic pain: Secondary | ICD-10-CM

## 2024-08-01 DIAGNOSIS — N1831 Chronic kidney disease, stage 3a: Secondary | ICD-10-CM

## 2024-08-01 MED ORDER — OXYCODONE HCL 10 MG PO TABS: 10.0000 mg | ORAL_TABLET | Freq: Three times a day (TID) | ORAL | 0 refills | Status: DC | PRN

## 2024-08-01 MED ORDER — MORPHINE SULFATE ER 30 MG PO TBCR: 30.0000 mg | EXTENDED_RELEASE_TABLET | Freq: Two times a day (BID) | ORAL | 0 refills | Status: DC
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
## 2024-08-01 NOTE — Assessment & Plan Note (Signed)
 Hydrate well

## 2024-08-01 NOTE — Assessment & Plan Note (Signed)
 Pt has an appt w/Dr Lavelle Ada tomorrow Pain meds-as above

## 2024-08-01 NOTE — Assessment & Plan Note (Signed)
 RTC 1 mo to check BP NAS diet Walk/bike (electric tricycle) more Continue amlodipine  and olmesartan 
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
## 2024-08-18 ENCOUNTER — Other Ambulatory Visit: Payer: Self-pay | Admitting: Internal Medicine

## 2024-08-21 DIAGNOSIS — M1711 Unilateral primary osteoarthritis, right knee: Secondary | ICD-10-CM | POA: Diagnosis not present

## 2024-08-30 DIAGNOSIS — R52 Pain, unspecified: Secondary | ICD-10-CM | POA: Diagnosis not present

## 2024-08-30 DIAGNOSIS — M1711 Unilateral primary osteoarthritis, right knee: Secondary | ICD-10-CM | POA: Diagnosis not present

## 2024-08-30 DIAGNOSIS — E871 Hypo-osmolality and hyponatremia: Secondary | ICD-10-CM | POA: Diagnosis not present

## 2024-08-30 DIAGNOSIS — K250 Acute gastric ulcer with hemorrhage: Secondary | ICD-10-CM | POA: Diagnosis not present

## 2024-09-02 ENCOUNTER — Other Ambulatory Visit: Payer: Self-pay

## 2024-09-02 DIAGNOSIS — D509 Iron deficiency anemia, unspecified: Secondary | ICD-10-CM

## 2024-09-02 MED ORDER — ACCRUFER 30 MG PO CAPS: 30.0000 mg | ORAL_CAPSULE | Freq: Two times a day (BID) | ORAL | 2 refills | Status: AC

## 2024-09-03 ENCOUNTER — Encounter: Payer: Self-pay | Admitting: Internal Medicine

## 2024-09-03 ENCOUNTER — Telehealth: Admitting: Internal Medicine

## 2024-09-03 DIAGNOSIS — M542 Cervicalgia: Secondary | ICD-10-CM | POA: Diagnosis not present

## 2024-09-03 DIAGNOSIS — G8929 Other chronic pain: Secondary | ICD-10-CM

## 2024-09-03 DIAGNOSIS — F4323 Adjustment disorder with mixed anxiety and depressed mood: Secondary | ICD-10-CM

## 2024-09-03 DIAGNOSIS — N1831 Chronic kidney disease, stage 3a: Secondary | ICD-10-CM | POA: Diagnosis not present

## 2024-09-03 DIAGNOSIS — M25561 Pain in right knee: Secondary | ICD-10-CM

## 2024-09-03 MED ORDER — MORPHINE SULFATE ER 30 MG PO TBCR: 30.0000 mg | EXTENDED_RELEASE_TABLET | Freq: Two times a day (BID) | ORAL | 0 refills | Status: DC

## 2024-09-03 MED ORDER — OXYCODONE HCL 10 MG PO TABS: 10.0000 mg | ORAL_TABLET | Freq: Three times a day (TID) | ORAL | 0 refills | Status: DC | PRN
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
## 2024-09-03 NOTE — Assessment & Plan Note (Signed)
On Cymbalta, BuSpar  

## 2024-09-03 NOTE — Assessment & Plan Note (Signed)
 Chronic pain in the back, hips, neck - on Oxy  Potential benefits of a long term opioids use as well as potential risks (i.e. addiction risk, apnea etc) and complications (i.e. Somnolence, constipation and others) were explained to the patient and were aknowledged. Rx given
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
## 2024-09-03 NOTE — Assessment & Plan Note (Signed)
 Hydrate well

## 2024-09-03 NOTE — Assessment & Plan Note (Signed)
 R knee pain is worse - Dr Georgina will operate on the knee in Jan 2026 (R TKR). Steroid inj did not help last time

## 2024-09-09 ENCOUNTER — Other Ambulatory Visit: Payer: Self-pay | Admitting: Medical Genetics

## 2024-09-09 ENCOUNTER — Ambulatory Visit (INDEPENDENT_AMBULATORY_CARE_PROVIDER_SITE_OTHER)

## 2024-09-09 VITALS — Ht <= 58 in | Wt 134.0 lb

## 2024-09-09 DIAGNOSIS — Z006 Encounter for examination for normal comparison and control in clinical research program: Secondary | ICD-10-CM

## 2024-09-09 DIAGNOSIS — Z0000 Encounter for general adult medical examination without abnormal findings: Secondary | ICD-10-CM

## 2024-09-09 NOTE — Progress Notes (Addendum)
 Subjective:   Samantha [name] is a 58 y.o. who presents for a Medicare Wellness preventive visit.  As a reminder, Annual Wellness Visits don't include a physical exam, and some assessments may be limited, especially if this visit is performed virtually. We may recommend an in-person follow-up visit with your provider if needed.  Visit Complete: Virtual I connected with  Samantha [name] on 09/09/24 by a audio enabled telemedicine application and verified that I am speaking with the correct person using two identifiers.  Patient Location: Home  Provider Location: Office/Clinic  I discussed the limitations of evaluation and management by telemedicine. The patient expressed understanding and agreed to proceed.  Vital Signs: Because this visit was a virtual/telehealth visit, some criteria may be missing or patient reported. Any vitals not documented were not able to be obtained and vitals that have been documented are patient reported.  VideoDeclined- This patient declined Librarian, academic. Therefore the visit was completed with audio only.  Persons Participating in Visit: Patient.  AWV Questionnaire: No: Patient Medicare AWV questionnaire was not completed prior to this visit.  Cardiac Risk Factors include: advanced age (>63men, >37 women);hypertension;Other (see comment), Risk factor comments: Hypotension     Objective:    Today's Vitals   09/09/24 1526  Weight: 134 lb (60.8 kg)  Height: 4' 10 (1.473 m)   Body mass index is 28.01 kg/m.     09/09/2024    3:26 PM 07/19/2024    8:29 PM 09/07/2023    3:37 PM 03/01/2023    1:13 PM 02/28/2023    4:50 PM 09/05/2022    1:12 PM 05/02/2022    5:06 PM  Advanced Directives  Does Patient Have a Medical Advance Directive? No Yes Yes No No Yes No;Yes  Type of Special Educational Needs Teacher of [place];Living will;Out of facility DNR (pink MOST or yellow form) Healthcare Power of [place];Living will    Healthcare Power of [place];Living will   Does patient want to make changes to medical advance directive?  No - Patient declined    No - Patient declined   Copy of Healthcare Power of Attorney in Chart?   No - copy requested   No - copy requested   Would patient like information on creating a medical advance directive? Yes (MAU/Ambulatory/Procedural Areas - Information given)   No - Patient declined No - Patient declined  No - Patient declined    Current Medications (verified) Outpatient Encounter Medications as of 09/09/2024  Medication Sig   amLODipine  (NORVASC ) 2.5 MG tablet TAKE 1 TABLET EVERY DAY   atorvastatin  (LIPITOR) 40 MG tablet Take 1 tablet (40 mg total) by mouth daily.   busPIRone  (BUSPAR ) 10 MG tablet TAKE 1 TABLET THREE TIMES DAILY   Calcium  Carb-Cholecalciferol (CALCIUM +D3 PO) Take 1 tablet by mouth 2 (two) times daily with a meal.   Docusate Calcium  (STOOL SOFTENER PO) Take 1 capsule by mouth 2 (two) times daily as needed (for constipation).   donepezil  (ARICEPT ) 10 MG tablet TAKE 1 TABLET AT BEDTIME   DULoxetine  (CYMBALTA ) 60 MG capsule TAKE 1 CAPSULE EVERY DAY   famotidine  (PEPCID ) 40 MG tablet TAKE 1 TABLET EVERY DAY   Ferric Maltol  (ACCRUFER ) 30 MG CAPS Take 1 capsule (30 mg total) by mouth 2 (two) times daily.   gabapentin  (NEURONTIN ) 600 MG tablet TAKE 1 TABLET THREE TIMES DAILY   lactulose  (CHRONULAC ) 10 GM/15ML solution Take 30-45 mLs (20-30 g total) by mouth 2 (two) times daily as needed  for moderate constipation or severe constipation.   lamoTRIgine  (LAMICTAL ) 100 MG tablet TAKE 1 TABLET EVERY MORNING AND 1 AND 1/2 TABLETS AT NIGHT   lidocaine  (LIDODERM ) 5 % Place 1-2 patches onto the skin daily. Remove & Discard patch within 12 hours or as directed by MD   linaclotide  (LINZESS ) 290 MCG CAPS capsule Take 1 capsule (290 mcg total) by mouth daily before breakfast.   memantine  (NAMENDA ) 5 MG tablet TAKE 1 TABLET TWICE DAILY   methylPREDNISolone  (MEDROL  DOSEPAK) 4 MG  TBPK tablet As directed   morphine  (MS CONTIN ) 30 MG 12 hr tablet Take 1 tablet (30 mg total) by mouth every 12 (twelve) hours.   olmesartan  (BENICAR ) 20 MG tablet Take 1 tablet (20 mg total) by mouth daily.   ondansetron  (ZOFRAN ) 4 MG tablet TAKE 1 TABLET EVERY 8 HOURS AS NEEDED FOR NAUSEA OR VOMITING.   Oxycodone  HCl 10 MG TABS Take 1 tablet (10 mg total) by mouth 3 (three) times daily as needed.   pantoprazole  (PROTONIX ) 40 MG tablet TAKE 1 TABLET TWICE DAILY BEFORE A MEAL   promethazine  (PHENERGAN ) 12.5 MG tablet Take 1 tablet (12.5 mg total) by mouth every 6 (six) hours as needed for nausea or vomiting.   No facility-administered encounter medications on file as of 09/09/2024.    Allergies (verified) Alprazolam , Citalopram hydrobromide, and Olmesartan    History: Past Medical History:  Diagnosis Date   Anxiety    Ole lesion, acute    Cerebral ventricular shunt fitting or adjustment    CKD (chronic kidney disease), stage III (HCC)    COMMON MIGRAINE 10/01/2007   Chronic     Depression    Esophageal ulceration    GERD (gastroesophageal reflux disease)    GI bleed    GLUCOSE INTOLERANCE 10/01/2007   Qualifier: Diagnosis of  By: Norleen MD, [place] ORN    Hiatal hernia    Hydrocephalus (HCC)    Hyperlipidemia    HYPERLIPIDEMIA 10/01/2007   Chronic. Lovaza  is too $$$ Declined statins due to worries    IDA (iron  deficiency anemia)    Memory loss    Migraine    OBSTRUCTIVE SLEEP APNEA 10/01/2007   NPSG 2006:  AHI 9/hr Started cpap 2009 successfully.      Paresthesia    Prolonged QT interval    Schatzki's ring    Sleep apnea    Stroke (HCC) 03/14/2013   Past Surgical History:  Procedure Laterality Date   [place] Shunt  1976,   cerebral vascular shunt/ with a revision 1981   BIOPSY  05/02/2022   Procedure: BIOPSY;  Surgeon: Federico Rosario BROCKS, MD;  Location: [facility] ENDOSCOPY;  Service: Gastroenterology;;   CHOLECYSTECTOMY  1981   CYST REMOVAL NECK     ESOPHAGEAL MANOMETRY N/A  05/03/2023   Procedure: ESOPHAGEAL MANOMETRY (EM);  Surgeon: Albertus Gordy HERO, MD;  Location: [facility] ENDOSCOPY;  Service: Gastroenterology;  Laterality: N/A;   ESOPHAGOGASTRODUODENOSCOPY (EGD) WITH PROPOFOL  N/A 05/02/2022   Procedure: ESOPHAGOGASTRODUODENOSCOPY (EGD) WITH PROPOFOL ;  Surgeon: Federico Rosario BROCKS, MD;  Location: [facility] ENDOSCOPY;  Service: Gastroenterology;  Laterality: N/A;   ESOPHAGOGASTRODUODENOSCOPY (EGD) WITH PROPOFOL  N/A 03/01/2023   Procedure: ESOPHAGOGASTRODUODENOSCOPY (EGD) WITH PROPOFOL ;  Surgeon: Albertus Gordy HERO, MD;  Location: [facility] ENDOSCOPY;  Service: Gastroenterology;  Laterality: N/A;   HEMOSTASIS CLIP PLACEMENT  03/01/2023   Procedure: HEMOSTASIS CLIP PLACEMENT;  Surgeon: Albertus Gordy HERO, MD;  Location: [facility] ENDOSCOPY;  Service: Gastroenterology;;   HEMOSTASIS CONTROL  03/01/2023   Procedure: HEMOSTASIS CONTROL;  Surgeon: Albertus Gordy  M, MD;  Location: [facility] ENDOSCOPY;  Service: Gastroenterology;;   Pituitary cyst w/subsequent shunt     Family History  Problem Relation Age of Onset   Dementia Mother    Kidney cancer Mother    Alzheimer's disease Mother    Migraines Mother    Colon polyps Mother    Heart disease Father    Diabetes Father    Brain cancer Maternal Aunt    Lung cancer Maternal Uncle    Esophageal cancer Maternal Grandmother    Alzheimer's disease Paternal Grandmother    Melanoma Other    Lung cancer Other    Lung cancer Other    Colon cancer Neg Hx    Rectal cancer Neg Hx    Stomach cancer Neg Hx    Social History   Socioeconomic History   Marital status: Married    Spouse name: Samantha [name]   Number of children: 0   Years of education: 12   Highest education level: 12th grade  Occupational History   Occupation: disabled  Tobacco Use   Smoking status: Never   Smokeless tobacco: Never  Vaping Use   Vaping status: Never Used  Substance and Sexual Activity   Alcohol use: Yes    Alcohol/week: 1.0 standard drink of alcohol    Types: 1 Glasses of wine per week     Comment: Social   Drug use: No   Sexual activity: Yes  Other Topics Concern   Not on file  Social History Narrative   Mother is in a NH.    Patient lives at home with her husband Samantha [name]). 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    High school education.   Right handed.   Caffeine 1 cup daily.   She has no children   Social Drivers of Corporate Investment Banker Strain: Low Risk  (09/09/2024)   Overall Financial Resource Strain (CARDIA)    Difficulty of Paying Living Expenses: Not hard at all  Food Insecurity: No Food Insecurity (09/09/2024)   Hunger Vital Sign    Worried About Running Out of Food in the Last Year: Never true    Ran Out of Food in the Last Year: Never true  Transportation Needs: No Transportation Needs (09/09/2024)   PRAPARE - Administrator, Civil Service (Medical): No    Lack of Transportation (Non-Medical): No  Physical Activity: Inactive (09/09/2024)   Exercise Vital Sign    Days of Exercise per Week: 0 days    Minutes of Exercise per Session: 0 min  Stress: No Stress Concern Present (09/09/2024)   [employer] of Occupational Health - Occupational Stress Questionnaire    Feeling of Stress: Only a little  Recent Concern: Stress - Stress Concern Present (08/01/2024)   Received from [facility] of Occupational Health - Occupational Stress Questionnaire    Do you feel stress - tense, restless, nervous, or anxious, or unable to sleep at night because your mind is troubled all the time - these days?: To some extent  Social Connections: Moderately Isolated (09/09/2024)   Social Connection and Isolation Panel    Frequency of Communication with Friends and Family: More than three times a week    Frequency of Social Gatherings with Friends and Family: Twice a week    Attends Religious Services: Never    Database Administrator or Organizations: No    Attends Banker Meetings: Never    Marital Status:  Married  Tobacco Counseling Counseling given: Not Answered    Clinical Intake:  Pre-visit preparation completed: Yes  Pain : No/denies pain     BMI - recorded: 28.01 Nutritional Risks: None Diabetes: No  Lab Results  Component Value Date   HGBA1C 5.7 01/12/2022   HGBA1C 5.3 07/16/2020   HGBA1C 5.8 08/29/2014     How often do you need to have someone help you when you read instructions, pamphlets, or other written materials from your doctor or pharmacy?: 1 - Never  Interpreter Needed?: No  Information entered by :: Verdie Saba, CMA   Activities of Daily Living     09/09/2024    3:31 PM  In your present state of health, do you have any difficulty performing the following activities:  Hearing? 0  Vision? 0  Difficulty concentrating or making decisions? 0  Walking or climbing stairs? 0  Dressing or bathing? 0  Doing errands, shopping? 0  Preparing Food and eating ? N  Using the Toilet? N  In the past six months, have you accidently leaked urine? Y  Comment wears a pantyliner  Do you have problems with loss of bowel control? N  Managing your Medications? N  Managing your Finances? N  Housekeeping or managing your Housekeeping? N    Patient Care Team: Plotnikov, Karlynn GAILS, MD as PCP - General (Internal Medicine) Tobb, Kardie, DO as PCP - Cardiology (Cardiology) Plotnikov, Karlynn GAILS, MD as Referring Physician (Internal Medicine) Darina Barrio, MD (Obstetrics and Gynecology) Onita Duos, MD as Consulting Physician (Neurology) Shellia Oh, MD as Consulting Physician (Pulmonary Disease) Delores Elsie JONELLE Mickey., MD as Referring Physician (Neurosurgery) Dorrine Ditch, MD as Referring Physician (Physical Medicine and Rehabilitation) Tanda Locus, MD as Consulting Physician (General Surgery) Pyrtle, Gordy HERO, MD as Consulting Physician (Gastroenterology) Tobie Mask, MD as Referring Physician (Pain Medicine) Darlean Ned, MD (Rehabilitation) Cleotilde Elspeth CROME,  OD (Optometry)  I have updated your Care Teams any recent Medical Services you may have received from other providers in the past year.     Assessment:   This is a routine wellness examination for Samantha [name].  Hearing/Vision screen Hearing Screening - Comments:: Denies hearing difficulties   Vision Screening - Comments:: Wears rx glasses - up to date with routine eye exams with Elspeth Cleotilde   Goals Addressed               This Visit's Progress     Patient Stated (pt-stated)        Patient stated that she plans to ride bikes with Spouse and walk more after having knee surgery       Depression Screen     09/09/2024    3:34 PM 04/04/2024    3:12 PM 12/12/2023    3:01 PM 09/20/2023    4:09 PM 09/07/2023    3:43 PM 08/15/2023    1:41 PM 07/25/2023    1:42 PM  PHQ 2/9 Scores  PHQ - 2 Score 0 0 0 0 2 0 0  PHQ- 9 Score 3   0 3      Fall Risk     09/09/2024    3:32 PM 08/01/2024    2:35 PM 04/04/2024    3:12 PM 12/12/2023    3:01 PM 09/20/2023    4:08 PM  Fall Risk   Falls in the past year? 0 0 0 1 1  Number falls in past yr: 0 0 0 0 1  Injury with Fall? 0 0 0  1  Risk  for fall due to : No Fall Risks No Fall Risks No Fall Risks History of fall(s) History of fall(s)  Follow up Falls evaluation completed;Falls prevention discussed Falls evaluation completed Falls evaluation completed Falls evaluation completed Falls evaluation completed    MEDICARE RISK AT HOME:  Medicare Risk at Home Any stairs in or around the home?: Yes (outside) If so, are there any without handrails?: No Home free of loose throw rugs in walkways, pet beds, electrical cords, etc?: Yes Adequate lighting in your home to reduce risk of falls?: Yes Life alert?: No Use of a cane, walker or w/c?: Yes (cane/rollater) Grab bars in the bathroom?: Yes Shower chair or bench in shower?: Yes Elevated toilet seat or a handicapped toilet?: No  TIMED UP AND GO:  Was the test performed?  No  Cognitive Function:  6CIT completed    03/18/2015   11:35 AM 12/18/2014   11:25 AM  MMSE - Mini Mental State Exam  Orientation to time 5  5   Orientation to Place 5  5   Registration 3  3   Attention/ Calculation 5  5   Recall 2  2   Language- name 2 objects 2  2   Language- repeat 1 1  Language- follow 3 step command 3  3   Language- read & follow direction 1  1   Write a sentence 1  1   Copy design 1  1   Total score 29  29      Data saved with a previous flowsheet row definition        09/09/2024    3:36 PM 09/07/2023    3:38 PM 09/05/2022    1:18 PM  6CIT Screen  What Year? 0 points 0 points 0 points  What month? 0 points 0 points 0 points  What time? 0 points 0 points 0 points  Count back from 20 0 points 0 points 0 points  Months in reverse 0 points 0 points 0 points  Repeat phrase 0 points 2 points 0 points  Total Score 0 points 2 points 0 points    Immunizations Immunization History  Administered Date(s) Administered   Fluad Trivalent(High Dose 65+) 09/20/2023   Influenza Whole 09/06/2012   Influenza,inj,Quad PF,6+ Mos 08/08/2013, 07/07/2014, 08/27/2015, 08/22/2016, 09/02/2021, 07/28/2022   PFIZER(Purple Top)SARS-COV-2 Vaccination 02/07/2020, 03/02/2020   Pneumococcal Conjugate-13 12/26/2016   Tdap 01/23/2015    Screening Tests Health Maintenance  Topic Date Due   Hepatitis C Screening  Never done   Pneumococcal Vaccine: 50+ Years (2 of 2 - PPSV23, PCV20, or PCV21) 02/20/2017   Cervical Cancer Screening (HPV/Pap Cotest)  09/06/2019   COVID-19 Vaccine (3 - Pfizer risk series) 03/30/2020   Influenza Vaccine  06/14/2024   DTaP/Tdap/Td (2 - Td or Tdap) 01/22/2025   Mammogram  07/26/2025   Medicare Annual Wellness (AWV)  09/09/2025   Colonoscopy  01/02/2033   HIV Screening  Completed   Hepatitis B Vaccines 19-59 Average Risk  Aged Out   HPV VACCINES  Aged Out   Meningococcal B Vaccine  Aged Out   Zoster Vaccines- Shingrix  Discontinued    Health Maintenance Items  Addressed: 09/09/2024    Additional Screening:  Vision Screening: Recommended annual ophthalmology exams for early detection of glaucoma and other disorders of the eye. Is the patient up to date with their annual eye exam?  Yes  Who is the provider or what is the name of the office in which the patient attends annual  eye exams? [facility]  Dental Screening: Recommended annual dental exams for proper oral hygiene  Community Resource Referral / Chronic Care Management: CRR required this visit?  No   CCM required this visit?  No   Plan:    I have personally reviewed and noted the following in the patient's chart:   Medical and social history Use of alcohol, tobacco or illicit drugs  Current medications and supplements including opioid prescriptions. Patient is currently taking opioid prescriptions. Information provided to patient regarding non-opioid alternatives. Patient advised to discuss non-opioid treatment plan with their provider. Functional ability and status Nutritional status Physical activity Advanced directives List of other physicians Hospitalizations, surgeries, and ER visits in previous 12 months Vitals Screenings to include cognitive, depression, and falls Referrals and appointments  In addition, I have reviewed and discussed with patient certain preventive protocols, quality metrics, and best practice recommendations. A written personalized care plan for preventive services as well as general preventive health recommendations were provided to patient.   Verdie CHRISTELLA Saba, CMA   09/09/2024   After Visit Summary: (MyChart) Due to this being a telephonic visit, the after visit summary with patients personalized plan was offered to patient via MyChart   Notes: Nothing significant to report at this time.  Medical screening examination/treatment/procedure(s) were performed by non-physician practitioner and as supervising physician I was immediately  available for consultation/collaboration.  I agree with above. Karlynn Noel, MD

## 2024-09-09 NOTE — Patient Instructions (Signed)
 Ms. Samantha [name],  Thank you for taking the time for your Medicare Wellness Visit. I appreciate your continued commitment to your health goals. Please review the care plan we discussed, and feel free to reach out if I can assist you further.  Medicare recommends these wellness visits once per year to help you and your care team stay ahead of potential health issues. These visits are designed to focus on prevention, allowing your provider to concentrate on managing your acute and chronic conditions during your regular appointments.  Please note that Annual Wellness Visits do not include a physical exam. Some assessments may be limited, especially if the visit was conducted virtually. If needed, we may recommend a separate in-person follow-up with your provider.  Ongoing Care Seeing your primary care provider every 3 to 6 months helps us  monitor your health and provide consistent, personalized care.   Referrals If a referral was made during today's visit and you haven't received any updates within two weeks, please contact the referred provider directly to check on the status.  Recommended Screenings:  Health Maintenance  Topic Date Due   Hepatitis C Screening  Never done   Pneumococcal Vaccine for age over 9 (2 of 2 - PPSV23, PCV20, or PCV21) 02/20/2017   Pap with HPV screening  09/06/2019   COVID-19 Vaccine (3 - Pfizer risk series) 03/30/2020   Flu Shot  06/14/2024   DTaP/Tdap/Td vaccine (2 - Td or Tdap) 01/22/2025   Breast Cancer Screening  07/26/2025   Medicare Annual Wellness Visit  09/09/2025   Colon Cancer Screening  01/02/2033   HIV Screening  Completed   Hepatitis B Vaccine  Aged Out   HPV Vaccine  Aged Out   Meningitis B Vaccine  Aged Out   Zoster (Shingles) Vaccine  Discontinued       09/09/2024    3:26 PM  Advanced Directives  Does Patient Have a Medical Advance Directive? No  Would patient like information on creating a medical advance directive? Yes  (MAU/Ambulatory/Procedural Areas - Information given)   Advance Care Planning is important because it: Ensures you receive medical care that aligns with your values, goals, and preferences. Provides guidance to your family and loved ones, reducing the emotional burden of decision-making during critical moments.  Vision: Annual vision screenings are recommended for early detection of glaucoma, cataracts, and diabetic retinopathy. These exams can also reveal signs of chronic conditions such as diabetes and high blood pressure.  Dental: Annual dental screenings help detect early signs of oral cancer, gum disease, and other conditions linked to overall health, including heart disease and diabetes.  Managing Pain Without Opioids Opioids are strong medicines used to treat moderate to severe pain. For some people, especially those who have long-term (chronic) pain, opioids may not be the best choice for pain management due to: Side effects like nausea, constipation, and sleepiness. The risk of addiction (opioid use disorder). The longer you take opioids, the greater your risk of addiction. Pain that lasts for more than 3 months is called chronic pain. Managing chronic pain usually requires more than one approach and is often provided by a team of health care providers working together (multidisciplinary approach). Pain management may be done at a pain management center or pain clinic. How to manage pain without the use of opioids Use non-opioid medicines Non-opioid medicines for pain may include: Over-the-counter or prescription non-steroidal anti-inflammatory drugs (NSAIDs). These may be the first medicines used for pain. They work well for muscle and bone pain, and  they reduce swelling. Acetaminophen . This over-the-counter medicine may work well for milder pain but not swelling. Antidepressants. These may be used to treat chronic pain. A certain type of antidepressant (tricyclics) is often used. These  medicines are given in lower doses for pain than when used for depression. Anticonvulsants. These are usually used to treat seizures but may also reduce nerve (neuropathic) pain. Muscle relaxants. These relieve pain caused by sudden muscle tightening (spasms). You may also use a pain medicine that is applied to the skin as a patch, cream, or gel (topical analgesic), such as a numbing medicine. These may cause fewer side effects than medicines taken by mouth. Do certain therapies as directed Some therapies can help with pain management. They include: Physical therapy. You will do exercises to gain strength and flexibility. A physical therapist may teach you exercises to move and stretch parts of your body that are weak, stiff, or painful. You can learn these exercises at physical therapy visits and practice them at home. Physical therapy may also involve: Massage. Heat wraps or applying heat or cold to affected areas. Electrical signals that interrupt pain signals (transcutaneous electrical nerve stimulation, TENS). Weak lasers that reduce pain and swelling (low-level laser therapy). Signals from your body that help you learn to regulate pain (biofeedback). Occupational therapy. This helps you to learn ways to function at home and work with less pain. Recreational therapy. This involves trying new activities or hobbies, such as a physical activity or drawing. Mental health therapy, including: Cognitive behavioral therapy (CBT). This helps you learn coping skills for dealing with pain. Acceptance and commitment therapy (ACT) to change the way you think and react to pain. Relaxation therapies, including muscle relaxation exercises and mindfulness-based stress reduction. Pain management counseling. This may be individual, family, or group counseling.  Receive medical treatments Medical treatments for pain management include: Nerve block injections. These may include a pain blocker and  anti-inflammatory medicines. You may have injections: Near the spine to relieve chronic back or neck pain. Into joints to relieve back or joint pain. Into nerve areas that supply a painful area to relieve body pain. Into muscles (trigger point injections) to relieve some painful muscle conditions. A medical device placed near your spine to help block pain signals and relieve nerve pain or chronic back pain (spinal cord stimulation device). Acupuncture. Follow these instructions at home Medicines Take over-the-counter and prescription medicines only as told by your health care provider. If you are taking pain medicine, ask your health care providers about possible side effects to watch out for. Do not drive or use heavy machinery while taking prescription opioid pain medicine. Lifestyle  Do not use drugs or alcohol to reduce pain. If you drink alcohol, limit how much you have to: 0-1 drink a day for women who are not pregnant. 0-2 drinks a day for men. Know how much alcohol is in a drink. In the [place] one drink equals one 12 oz bottle of beer (355 mL), one 5 oz glass of wine (148 mL), or one 1 oz glass of hard liquor (44 mL). Do not use any products that contain nicotine or tobacco. These products include cigarettes, chewing tobacco, and vaping devices, such as e-cigarettes. If you need help quitting, ask your health care provider. Eat a healthy diet and maintain a healthy weight. Poor diet and excess weight may make pain worse. Eat foods that are high in fiber. These include fresh fruits and vegetables, whole grains, and beans. Limit foods that are  high in fat and processed sugars, such as fried and sweet foods. Exercise regularly. Exercise lowers stress and may help relieve pain. Ask your health care provider what activities and exercises are safe for you. If your health care provider approves, join an exercise class that combines movement and stress reduction. Examples include yoga and tai  chi. Get enough sleep. Lack of sleep may make pain worse. Lower stress as much as possible. Practice stress reduction techniques as told by your therapist. General instructions Work with all your pain management providers to find the treatments that work best for you. You are an important member of your pain management team. There are many things you can do to reduce pain on your own. Consider joining an online or in-person support group for people who have chronic pain. Keep all follow-up visits. This is important. Where to find more information You can find more information about managing pain without opioids from: American Academy of Pain Medicine: painmed.org Institute for Chronic Pain: instituteforchronicpain.org American Chronic Pain Association: theacpa.org Contact a health care provider if: You have side effects from pain medicine. Your pain gets worse or does not get better with treatments or home therapy. You are struggling with anxiety or depression. Summary Many types of pain can be managed without opioids. Chronic pain may respond better to pain management without opioids. Pain is best managed when you and a team of health care providers work together. Pain management without opioids may include non-opioid medicines, medical treatments, physical therapy, mental health therapy, and lifestyle changes. Tell your health care providers if your pain gets worse or is not being managed well enough. This information is not intended to replace advice given to you by your health care provider. Make sure you discuss any questions you have with your health care provider. Document Revised: 02/10/2021 Document Reviewed: 02/10/2021 Elsevier Patient Education  2024 [employer].

## 2024-09-19 ENCOUNTER — Ambulatory Visit: Admitting: Internal Medicine

## 2024-09-19 DIAGNOSIS — G4733 Obstructive sleep apnea (adult) (pediatric): Secondary | ICD-10-CM

## 2024-09-24 ENCOUNTER — Telehealth: Payer: Self-pay

## 2024-09-24 NOTE — Telephone Encounter (Signed)
 Copied from CRM [id number]. Topic: General - Other >> Sep 23, 2024  3:24 PM Samantha [name] wrote: Reason for CRM: Patient states [name] fax was sent over for surgery authorization request from [facility] for her upcoming knee replacement on 11/19/24.  Callback number: [phone]

## 2024-09-24 NOTE — Telephone Encounter (Signed)
 Fax received from Dr. Lavelle Ada with [facility] to perform a right total knee replacement on patient.  Patient needs surgery clearance. Surgery is 11/19/24. Patient was seen on 04/22/24. Office protocol is a risk assessment can be sent to surgeon if patient has been seen in 60 days or less.   Pt needs appt for risk assessment. I called her to get her scheduled and there was no answer- LMTCB and will route to clearance pool for f/u.

## 2024-09-26 ENCOUNTER — Telehealth: Payer: Self-pay

## 2024-09-26 ENCOUNTER — Ambulatory Visit (INDEPENDENT_AMBULATORY_CARE_PROVIDER_SITE_OTHER): Admitting: Internal Medicine

## 2024-09-26 ENCOUNTER — Encounter: Payer: Self-pay | Admitting: Internal Medicine

## 2024-09-26 VITALS — BP 124/82 | HR 67 | Temp 98.9°F | Ht <= 58 in | Wt 145.8 lb

## 2024-09-26 DIAGNOSIS — Z23 Encounter for immunization: Secondary | ICD-10-CM

## 2024-09-26 DIAGNOSIS — G4733 Obstructive sleep apnea (adult) (pediatric): Secondary | ICD-10-CM | POA: Diagnosis not present

## 2024-09-26 NOTE — Progress Notes (Signed)
 HPI female never smoker followed for OSA, complicated by history of migraine, hydrocephalus/VP shunt/  and concern for nocturnal seizures, Hx CVA for which she is followed by Neurology, back pain NPSG 03/08/15 at [facility] Sleep with full seizure montage- AHI 12.1/ hr, desaturation to 91%, no seizure activity   ------------------------------------------------------------------------------------------------   04/22/24- 58 year old female never smoker followed by Dr. Shellia for OSA, complicated by history of migraine, hydrocephalus/VP shunt and concern for nocturnal seizures, Hx CVA for which she is followed by Neurology, back pain CPAP auto 5-15/[facility]  17 Download compliance- LOV- [employer] Ferrari NP 09/20/2019- [facility], CPAP 5-15, good compliance and control at that time. Body weight today-142 lbs Discussed the use of AI scribe software for clinical note transcription with the patient, who gave verbal consent to proceed.  History of Present Illness   LASTACIA SOLUM is a 58 year old female with sleep apnea who presents with persistent daytime sleepiness despite CPAP use.  She experiences persistent daytime sleepiness despite regular CPAP use since her sleep apnea diagnosis in 2016. She easily falls asleep during activities such as watching movies, a problem that predates CPAP therapy. Her CPAP machine is old, and she cannot determine its settings. She snores lightly even with CPAP, and her husband often adjusts her nasal mask. She talks in her sleep and experiences twitching movements but has no history of sleepwalking or violent movements during sleep. She takes Tylenol  PM nightly to aid sleep and consumes one cup of coffee in the morning and some sodas during the day, switching to decaf later. She naps most days for a couple of hours, which sometimes helps her feel better. No ENT surgery. Has dentures.     Assessment and Plan:    Obstructive Sleep Apnea Chronic obstructive sleep apnea with  persistent symptoms despite CPAP use. CPAP machine outdated, settings unknown. Long uvula may contribute to obstruction. She preferred in-lab sleep study for accurate assessment. - Order in-lab split night sleep study [facility]. [facility] insurance authorization for sleep study.  Chronic Pain Chronic pain in back, knee, and hip. Pain management not discussed.  Stroke Stroke in 2014. No current neurological symptoms. V-P shunt in place of NPH  Cameron Lesions Cameron lesions on hiatal hernia, previously cauterized. No current gastrointestinal bleeding.    09/26/24- 58 year old female never smoker followed by Dr. Shellia for OSA, complicated by history of migraine, hydrocephalus/VP shunt and concern for nocturnal seizures, Hx CVA for which she is followed by Neurology, back pain Split Night sleep study 07/19/24-Moderate obstructive sleep apnea, AHI(3%) 26.3/hr. Snoring with oxygen desaturation to a nadir of 87%, mean 91.9%. CPAP was titrated to13 cwp, associated with treatment-emergent central apneas. Optimum titration appeared to be 7 cwp with residual AHI(3%) 6.5/hr, desaturation to 91%, mean 93.1%.  CPAP auto 5-15/[facility]  17 Download compliance-93%, AHI 5.6/hr -----Review sleep test 07/19/2024.  Surgical clearance for knee replacement 11/19/2024 Discussed the use of AI scribe software for clinical note transcription with the patient, who gave verbal consent to proceed.  History of Present Illness   The patient is a 58 year old with sleep apnea who presents for follow-up after a recent split night sleep study and also seeks surgical clearance for knee replacement.  She uses a CPAP machine with settings between five and fifteen, experiencing five to six breakthrough apneas per hour, improved from twenty-six apneas per hour noted in the sleep study. No new issues with breathing, heart problems, or coughing. Her blood pressure  is well-controlled with medication. She  is interested in receiving a flu shot and a pneumonia vaccine, having last received the latter in 2018.   She is stable from a Pulmonary standpoint for planned surgery, with no recent infection, cough or dyspnea. I advised her to wear her CPAP in Recovery.     Assessment and Plan:    Obstructive sleep apnea Moderately severe with AHI of 26, reduced to 5-6 with CPAP. CPAP due for replacement. - Ordered replacement CPAP machine through [facility]. - Advised CPAP use in recovery room post-surgery.  Surgical clearance for total knee replacement in the setting of primary osteoarthritis of the knee Scheduled for total knee replacement on January 6th, 2026. No Pulmonary contraindications for surgery. - Provided surgical clearance. - Advised to discuss CPAP use in recovery room with anesthesiologist and surgeon.  Essential hypertension Well-controlled with current medication regimen. - Continue current antihypertensive medication.      ROS-see HPI   + = pos Constitutional:    weight loss, night sweats, fevers, chills, fatigue, lassitude. HEENT:    headaches, difficulty swallowing, tooth/dental problems, sore throat,       sneezing, itching, ear ache, nasal congestion, post nasal drip, snoring CV:    chest pain, orthopnea, PND, swelling in lower extremities, anasarca,                                                         dizziness, palpitations Resp:   shortness of breath with exertion or at rest.                productive cough,   non-productive cough, coughing up of blood.              change in color of mucus.  wheezing.   Skin:    rash or lesions. GI:  No-   heartburn, indigestion, abdominal pain, nausea, vomiting, diarrhea,                 change in bowel habits, loss of appetite GU: dysuria, change in color of urine, no urgency or frequency.   flank pain. MS:   joint pain, stiffness, decreased range of motion, back pain. Neuro-     nothing unusual Psych:  change in mood or  affect.  depression or anxiety.   memory loss.  OBJ- Physical Exam       General- Alert, Oriented, Affect-appropriate, Distress- none acute, +short Skin- rash-none, lesions- none, excoriation- none Lymphadenopathy- none Head- atraumatic, + R VP shunt            Eyes- Gross vision intact, PERRLA, conjunctivae and secretions clear            Ears- Hearing, canals-normal            Nose- Clear, no-Septal dev, mucus, polyps, erosion, perforation             Throat- Mallampati III-IV , mucosa clear , drainage- none, tonsils+present Neck- flexible , trachea midline, no stridor , thyroid  nl, carotid no bruit Chest - symmetrical excursion , unlabored           Heart/CV- RRR , no murmur , no gallop  , no rub, nl s1 s2                           -  JVD- none , edema- none, stasis changes- none, varices- none           Lung- clear to P&A, wheeze- none, cough- none , dullness-none, rub- none           Chest wall-  Abd-  Br/ Gen/ Rectal- Not done, not indicated Extrem- cyanosis- none, clubbing, none, atrophy- none, strength- nl Neuro- grossly intact to observation

## 2024-09-26 NOTE — Telephone Encounter (Signed)
 Copied from CRM [id number]. Topic: Clinical - Medication Question >> Sep 26, 2024  3:09 PM Leila BROCKS wrote: Reason for CRM: Patient [phone] states saw Dr. Neysa today and the after summary report has patient's medications have changed today, see your updated medication list for details. Patient looked the medications list and does not see anything and needs clarification. Please advise and call back.   ATCx1 LVMTCB

## 2024-09-26 NOTE — Patient Instructions (Addendum)
 Order- DME [facility]- replace old CPAP machine, auto 5-15 mask of choice, humidifier, supplies, AirView/ card   You are clear from a Pulmonary standpoint for planned knee surgery. You should wear CPAP in Recovery.  Order flu vax standard  Ask front desk to schedule you back with a sleep doctor

## 2024-09-27 NOTE — Telephone Encounter (Signed)
 ATCx2, left detailed message per DPR. Sending MyChart message as pt has been unable to be reached, then closing encounter. NFN.

## 2024-09-30 ENCOUNTER — Encounter: Payer: Self-pay | Admitting: Internal Medicine

## 2024-10-01 ENCOUNTER — Telehealth (INDEPENDENT_AMBULATORY_CARE_PROVIDER_SITE_OTHER): Admitting: Internal Medicine

## 2024-10-01 DIAGNOSIS — K2101 Gastro-esophageal reflux disease with esophagitis, with bleeding: Secondary | ICD-10-CM

## 2024-10-01 DIAGNOSIS — G8929 Other chronic pain: Secondary | ICD-10-CM | POA: Diagnosis not present

## 2024-10-01 DIAGNOSIS — M25561 Pain in right knee: Secondary | ICD-10-CM

## 2024-10-01 MED ORDER — OXYCODONE HCL 10 MG PO TABS: 10.0000 mg | ORAL_TABLET | Freq: Three times a day (TID) | ORAL | 0 refills | Status: DC | PRN

## 2024-10-01 MED ORDER — MORPHINE SULFATE ER 30 MG PO TBCR: 30.0000 mg | EXTENDED_RELEASE_TABLET | Freq: Two times a day (BID) | ORAL | 0 refills | Status: DC

## 2024-10-01 NOTE — Assessment & Plan Note (Signed)
 Surgery is planned for 11/20/2023

## 2024-10-01 NOTE — Telephone Encounter (Signed)
 Copy of ov 09/26/24 was faxed to [facility].

## 2024-10-01 NOTE — Assessment & Plan Note (Signed)
 On Protonix bid
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
## 2024-10-31 ENCOUNTER — Telehealth: Payer: Self-pay | Admitting: *Deleted

## 2024-10-31 NOTE — Telephone Encounter (Signed)
 Copied from CRM [id number]. Topic: General - Other >> Sep 23, 2024  3:24 PM Isabell A wrote: Reason for CRM: Patient states a fax was sent over for surgery authorization request from [facility] for her upcoming knee replacement on 11/19/24.  Callback number: [phone] >> Oct 28, 2024 11:17 AM Corean SAUNDERS wrote: Dorianne at [facility] is requesting the patients OV notes from 11/13 be faxed to her at [phone] - needed for surgical clearance    The note had already been faxed on 10/01/24. I faxed it again just now to the number provided above attn Dorianne.

## 2024-11-15 ENCOUNTER — Other Ambulatory Visit: Payer: Self-pay | Admitting: Internal Medicine

## 2024-11-15 ENCOUNTER — Encounter: Payer: Self-pay | Admitting: Internal Medicine

## 2024-11-15 NOTE — Telephone Encounter (Signed)
 Copied from CRM [id number]. Topic: Clinical - Medication Refill >> Nov 15, 2024  1:10 PM Victoria A wrote: Medication: morphine  (MS CONTIN ) 30 MG 12 hr tablet  Has the patient contacted their pharmacy? Yes (Agent: If no, request that the patient contact the pharmacy for the refill. If patient does not wish to contact the pharmacy document the reason why and proceed with request.) (Agent: If yes, when and what did the pharmacy advise?) Sent request  This is the patient's preferred pharmacy:   [id number] - [place], [place] - [address] AT [facility] OF [address] & [address] [place] [id number] Phone: [phone] Fax: [phone]  Is this the correct pharmacy for this prescription? Yes If no, delete pharmacy and type the correct one.   Has the prescription been filled recently? No  Is the patient out of the medication? Yes  Has the patient been seen for an appointment in the last year OR does the patient have an upcoming appointment? Yes  Can we respond through MyChart? Yes  Agent: Please be advised that Rx refills may take up to 3 business days. We ask that you follow-up with your pharmacy.

## 2024-11-18 ENCOUNTER — Other Ambulatory Visit: Payer: Self-pay | Admitting: Internal Medicine

## 2024-11-18 NOTE — Telephone Encounter (Unsigned)
 Copied from CRM [id number]. Topic: Clinical - Medication Refill >> Nov 18, 2024 11:26 AM Viola F wrote: Medication: morphine  (MS CONTIN ) 30 MG 12 hr tablet [[id number]]  Has the patient contacted their pharmacy? Yes (Agent: If no, request that the patient contact the pharmacy for the refill. If patient does not wish to contact the pharmacy document the reason why and proceed with request.) (Agent: If yes, when and what did the pharmacy advise?)  This is the patient's preferred pharmacy:   [id number] - [place], [place] - [address] AT [facility] OF [address] [place] [address] Phone: [phone] Fax: [phone]  Is this the correct pharmacy for this prescription? Yes If no, delete pharmacy and type the correct one.   Has the prescription been filled recently? Yes  Is the patient out of the medication? Yes  Has the patient been seen for an appointment in the last year OR does the patient have an upcoming appointment? Yes  Can we respond through MyChart? Yes  Agent: Please be advised that Rx refills may take up to 3 business days. We ask that you follow-up with your pharmacy.

## 2024-11-19 ENCOUNTER — Telehealth: Payer: Self-pay

## 2024-11-19 ENCOUNTER — Telehealth: Payer: Self-pay | Admitting: Internal Medicine

## 2024-11-19 NOTE — Telephone Encounter (Signed)
 Copied from CRM #[id number]. Topic: Clinical - Prescription Issue >> Nov 19, 2024 12:58 PM Tiffini S wrote: Reason for CRM: Patient sister Olam Lager called about medication refill for morphine  (MS CONTIN ) 30 MG 12 hr tablet/ patient had the knee placement surgery this morning / will be discharged home tomorrow on 11/20/24- must have medication per surgeon  Advised: (pending)   morphine  (MS CONTIN ) 30 MG 12 hr tablet 60 tablet    Earliest Fill Date: 11/18/2024  Notes to Pharmacy: Please fill on or after 10/16/24. Code: M54.50

## 2024-11-19 NOTE — Telephone Encounter (Signed)
 Copied from CRM [id number]. Topic: Clinical - Medication Refill >> Nov 18, 2024 11:26 AM Viola F wrote: Medication: morphine  (MS CONTIN ) 30 MG 12 hr tablet [[id number]]  Has the patient contacted their pharmacy? Yes (Agent: If no, request that the patient contact the pharmacy for the refill. If patient does not wish to contact the pharmacy document the reason why and proceed with request.) (Agent: If yes, when and what did the pharmacy advise?)  This is the patient's preferred pharmacy:   [id number] - [place], [place] - [address] AT [facility] OF [address] [place] [address] Phone: [phone] Fax: [phone]  Is this the correct pharmacy for this prescription? Yes If no, delete pharmacy and type the correct one.   Has the prescription been filled recently? Yes  Is the patient out of the medication? Yes  Has the patient been seen for an appointment in the last year OR does the patient have an upcoming appointment? Yes  Can we respond through MyChart? Yes  Agent: Please be advised that Rx refills may take up to 3 business days. We ask that you follow-up with your pharmacy. >> Nov 18, 2024  4:32 PM Hadassah PARAS wrote: Pt has surgery tomorrow and was advised by surgeron to take morphine  (MS CONTIN ) 30 MG 12 hr tablet before knee replacement surgery. Please advise pt on #[id number]

## 2024-11-19 NOTE — Telephone Encounter (Signed)
 Copied from CRM [id number]. Topic: Clinical - Prescription Issue >> Nov 19, 2024  2:11 PM Charolett L wrote: Reason for CRM: patient called in and stated that she needs the morphine  (MS CONTIN ) 30 MG 12 hr tablet to be sent to the pharmacy due to her just having  knee surgery [id number] - [place], [place] - [address] AT [facility] OF [address] [place] [address] Phone: [phone] Fax: [phone]

## 2024-11-20 ENCOUNTER — Ambulatory Visit: Payer: Self-pay

## 2024-11-20 ENCOUNTER — Other Ambulatory Visit: Payer: Self-pay | Admitting: Internal Medicine

## 2024-11-20 MED ORDER — MORPHINE SULFATE ER 30 MG PO TBCR: 30.0000 mg | EXTENDED_RELEASE_TABLET | Freq: Two times a day (BID) | ORAL | 0 refills | Status: DC

## 2024-11-20 NOTE — Telephone Encounter (Signed)
 She had a prescription at the pharmacy to be filled on or after 11/16/2024.  Thanks

## 2024-11-20 NOTE — Telephone Encounter (Signed)
 [name] had a morphine  prescription for January.  Thanks

## 2024-11-20 NOTE — Telephone Encounter (Signed)
" °  morphine  (MS CONTIN ) 30 MG 12 hr tablet Take 1 tablet (30 mg total) by mouth every 12 (twelve) hours. Dispense: 60 tablet, Refills: 0 ordered   11/20/2024 -- Onelia Cadmus V, MD            Summary:Take 1 tablet (30 mg total) by mouth every 12 (twelve) hours., Starting Wed 11/20/2024, Normal Dose, Route, Frequency:30 mg, Oral, Every 12 hoursDx Associated:End Date Warning:Different Encounter:Start Date & Time:01/07/2026Ord/Sold:11/20/2024 (O)Ordered On:01/07/2026ReportPharmacy:[id number] - [place], [place] - [address] AT [facility] OF [address]      Note to Pharmacy:Please fill on or after 11/16/24. Code: M54.50 Ordering Department:[facility]    "

## 2024-11-20 NOTE — Telephone Encounter (Signed)
 Unable to reach pt, LVM requesting return call. Routing to clinic for follow up.    Copied from CRM [id number]. Topic: Clinical - Red Word Triage >> Nov 20, 2024  4:33 PM China J wrote: [facility] that prompted transfer to Nurse Triage: Patient just had a knee replacement and was only sent home with 5mg  of oxycodone  because Dr. Georgina would not be able to prescribe the regulary oxycodone  10mg  that Dr. Garald usually prescribes, alongside with the 5mg  he sent her home with.  Patient is in extreme pain following surgery and is needing something to help with this.  (Refill request telephone encounter was created but not routed due to finding out patient's symptoms) >> Nov 20, 2024  4:41 PM China J wrote: Patient disconnected call before I was able to transfer.

## 2024-11-20 NOTE — Telephone Encounter (Signed)
 Copied from CRM [id number]. Topic: Clinical - Medication Refill >> Nov 20, 2024  4:30 PM China J wrote: Medication: Oxycodone  HCl 10 MG TABS  Has the patient contacted their pharmacy? Yes (Agent: If no, request that the patient contact the pharmacy for the refill. If patient does not wish to contact the pharmacy document the reason why and proceed with request.) (Agent: If yes, when and what did the pharmacy advise?)  This is the patient's preferred pharmacy:  [id number] - [place], [place] - [address] AT [facility] OF [address]  [place] [address] Phone: [phone] Fax: [phone]  Is this the correct pharmacy for this prescription? Yes If no, delete pharmacy and type the correct one.   Has the prescription been filled recently? No  Is the patient out of the medication? Yes  Has the patient been seen for an appointment in the last year OR does the patient have an upcoming appointment? Yes  Can we respond through MyChart? Yes  Agent: Please be advised that Rx refills may take up to 3 business days. We ask that you follow-up with your pharmacy.

## 2024-11-21 ENCOUNTER — Encounter: Payer: Self-pay | Admitting: Cardiology

## 2024-11-21 NOTE — Telephone Encounter (Signed)
 Tried to reach pt to inform her that she has a rx at her pharmacy for her morphine  to be refilled per PCP.

## 2024-11-21 NOTE — Telephone Encounter (Signed)
 She had a prescription at the pharmacy to be filled on or after 11/16/2024.

## 2024-11-21 NOTE — Telephone Encounter (Signed)
 Samantha [name] had a morphine  prescription for January.

## 2024-11-22 NOTE — Telephone Encounter (Signed)
"  Copied from CRM #[id number]. Topic: Clinical - Medication Question >> Nov 22, 2024  1:18 PM Samantha [name] wrote: Reason for CRM: Patient is stating she's in post-op pain and is needing the Oxycodone  HCl 10 MG TABS. Advised request has been sent and is pending, but stating she is down to one pill that was given by the surgeon but is in pain  "

## 2024-11-25 MED ORDER — OXYCODONE HCL 10 MG PO TABS: 10.0000 mg | ORAL_TABLET | Freq: Three times a day (TID) | ORAL | 0 refills | Status: DC | PRN

## 2024-11-26 NOTE — Telephone Encounter (Signed)
It has been done. Thanks

## 2024-11-29 ENCOUNTER — Encounter: Payer: Self-pay | Admitting: Internal Medicine

## 2024-12-02 ENCOUNTER — Telehealth: Payer: Self-pay

## 2024-12-02 ENCOUNTER — Encounter: Payer: Self-pay | Admitting: Internal Medicine

## 2024-12-02 NOTE — Telephone Encounter (Signed)
 Copied from CRM [id number]. Topic: Clinical - Prescription Issue >> Dec 02, 2024  3:56 PM Macario HERO wrote: Reason for CRM: Patient called said she saw Dr. Garald message on MyChart but was a little confused. She stated she is requesting a break through to help her deal with the pain. Requesting a call back from nurse on what to do.

## 2024-12-05 ENCOUNTER — Other Ambulatory Visit: Payer: Self-pay | Admitting: Internal Medicine

## 2024-12-05 ENCOUNTER — Other Ambulatory Visit: Payer: Self-pay | Admitting: Family

## 2024-12-05 ENCOUNTER — Other Ambulatory Visit: Payer: Self-pay | Admitting: Cardiology

## 2024-12-06 NOTE — Telephone Encounter (Signed)
 She has a breakthrough oxycodone  prescription.  Thanks

## 2024-12-10 ENCOUNTER — Other Ambulatory Visit: Payer: Self-pay | Admitting: Internal Medicine

## 2024-12-14 ENCOUNTER — Encounter: Payer: Self-pay | Admitting: Internal Medicine

## 2024-12-18 ENCOUNTER — Encounter: Payer: Self-pay | Admitting: Internal Medicine

## 2024-12-18 ENCOUNTER — Telehealth: Admitting: Internal Medicine

## 2024-12-18 DIAGNOSIS — N1831 Chronic kidney disease, stage 3a: Secondary | ICD-10-CM

## 2024-12-18 DIAGNOSIS — R413 Other amnesia: Secondary | ICD-10-CM

## 2024-12-18 DIAGNOSIS — I10 Essential (primary) hypertension: Secondary | ICD-10-CM

## 2024-12-18 DIAGNOSIS — G8929 Other chronic pain: Secondary | ICD-10-CM

## 2024-12-18 DIAGNOSIS — M79605 Pain in left leg: Secondary | ICD-10-CM

## 2024-12-18 MED ORDER — OXYCODONE HCL 10 MG PO TABS: 10.0000 mg | ORAL_TABLET | Freq: Three times a day (TID) | ORAL | 0 refills | Status: AC | PRN

## 2024-12-18 MED ORDER — MORPHINE SULFATE ER 30 MG PO TBCR: 30.0000 mg | EXTENDED_RELEASE_TABLET | Freq: Two times a day (BID) | ORAL | 0 refills | Status: AC

## 2024-12-18 NOTE — Assessment & Plan Note (Signed)
 Stable Walk more Valerian root for insomnia
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
## 2024-12-18 NOTE — Assessment & Plan Note (Signed)
 On Oxycodone  and MS Contin    Potential benefits of a long term opioids use as well as potential risks (i.e. addiction risk, apnea etc) and complications (i.e. Somnolence, constipation and others) were explained to the patient and were aknowledged. s/p R TKR 11/19/24
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
## 2024-12-18 NOTE — Assessment & Plan Note (Signed)
 RTC 1 mo to check BP NAS diet Walk/bike (electric tricycle) more Continue amlodipine  and olmesartan 

## 2024-12-18 NOTE — Assessment & Plan Note (Signed)
 Hydrate well

## 2024-12-30 ENCOUNTER — Ambulatory Visit: Admitting: Internal Medicine

## 2025-10-02 ENCOUNTER — Ambulatory Visit

## 2025-10-06 ENCOUNTER — Ambulatory Visit

## 2025-10-06 ENCOUNTER — Encounter: Admitting: Internal Medicine
